# Patient Record
Sex: Female | Born: 1970 | Race: White | Hispanic: No | Marital: Married | Smoking: Never smoker
Health system: Southern US, Community
[De-identification: ages and names within clinical notes are randomized; demographics above are authoritative.]

## PROBLEM LIST (undated history)

## (undated) DIAGNOSIS — I890 Lymphedema, not elsewhere classified: Secondary | ICD-10-CM

## (undated) DIAGNOSIS — K635 Polyp of colon: Secondary | ICD-10-CM

## (undated) DIAGNOSIS — S4990XA Unspecified injury of shoulder and upper arm, unspecified arm, initial encounter: Secondary | ICD-10-CM

## (undated) DIAGNOSIS — T4145XA Adverse effect of unspecified anesthetic, initial encounter: Secondary | ICD-10-CM

## (undated) DIAGNOSIS — I2510 Atherosclerotic heart disease of native coronary artery without angina pectoris: Secondary | ICD-10-CM

## (undated) DIAGNOSIS — I739 Peripheral vascular disease, unspecified: Secondary | ICD-10-CM

## (undated) DIAGNOSIS — T8859XA Other complications of anesthesia, initial encounter: Secondary | ICD-10-CM

## (undated) DIAGNOSIS — E039 Hypothyroidism, unspecified: Secondary | ICD-10-CM

## (undated) DIAGNOSIS — R112 Nausea with vomiting, unspecified: Secondary | ICD-10-CM

## (undated) DIAGNOSIS — K7689 Other specified diseases of liver: Secondary | ICD-10-CM

## (undated) DIAGNOSIS — E079 Disorder of thyroid, unspecified: Secondary | ICD-10-CM

## (undated) DIAGNOSIS — N809 Endometriosis, unspecified: Secondary | ICD-10-CM

## (undated) DIAGNOSIS — Z9889 Other specified postprocedural states: Secondary | ICD-10-CM

## (undated) DIAGNOSIS — R7303 Prediabetes: Secondary | ICD-10-CM

## (undated) DIAGNOSIS — J342 Deviated nasal septum: Secondary | ICD-10-CM

## (undated) DIAGNOSIS — E162 Hypoglycemia, unspecified: Secondary | ICD-10-CM

## (undated) DIAGNOSIS — I8390 Asymptomatic varicose veins of unspecified lower extremity: Secondary | ICD-10-CM

## (undated) DIAGNOSIS — I10 Essential (primary) hypertension: Secondary | ICD-10-CM

## (undated) DIAGNOSIS — C439 Malignant melanoma of skin, unspecified: Secondary | ICD-10-CM

## (undated) DIAGNOSIS — Z8489 Family history of other specified conditions: Secondary | ICD-10-CM

## (undated) DIAGNOSIS — N200 Calculus of kidney: Secondary | ICD-10-CM

## (undated) DIAGNOSIS — G43009 Migraine without aura, not intractable, without status migrainosus: Secondary | ICD-10-CM

## (undated) DIAGNOSIS — R928 Other abnormal and inconclusive findings on diagnostic imaging of breast: Secondary | ICD-10-CM

## (undated) DIAGNOSIS — G4730 Sleep apnea, unspecified: Secondary | ICD-10-CM

## (undated) DIAGNOSIS — T7840XA Allergy, unspecified, initial encounter: Secondary | ICD-10-CM

## (undated) HISTORY — PX: COLONOSCOPY: SHX174

## (undated) HISTORY — DX: Calculus of kidney: N20.0

## (undated) HISTORY — DX: Hypoglycemia, unspecified: E16.2

## (undated) HISTORY — DX: Endometriosis, unspecified: N80.9

## (undated) HISTORY — PX: TUBAL LIGATION: SHX77

## (undated) HISTORY — DX: Migraine without aura, not intractable, without status migrainosus: G43.009

## (undated) HISTORY — PX: ABDOMINAL HYSTERECTOMY: SHX81

## (undated) HISTORY — PX: BREAST BIOPSY: SHX20

## (undated) HISTORY — DX: Other specified diseases of liver: K76.89

## (undated) HISTORY — DX: Allergy, unspecified, initial encounter: T78.40XA

## (undated) HISTORY — DX: Essential (primary) hypertension: I10

## (undated) HISTORY — PX: COLONOSCOPY: SHX5424

## (undated) HISTORY — DX: Malignant melanoma of skin, unspecified: C43.9

## (undated) HISTORY — DX: Polyp of colon: K63.5

## (undated) HISTORY — DX: Disorder of thyroid, unspecified: E07.9

## (undated) HISTORY — PX: WISDOM TOOTH EXTRACTION: SHX21

## (undated) HISTORY — PX: BREAST EXCISIONAL BIOPSY: SUR124

## (undated) HISTORY — DX: Asymptomatic varicose veins of unspecified lower extremity: I83.90

---

## 1994-08-12 DIAGNOSIS — E162 Hypoglycemia, unspecified: Secondary | ICD-10-CM

## 1994-08-12 HISTORY — DX: Hypoglycemia, unspecified: E16.2

## 1998-01-16 ENCOUNTER — Other Ambulatory Visit: Admission: RE | Admit: 1998-01-16 | Discharge: 1998-01-16 | Payer: Self-pay | Admitting: Obstetrics and Gynecology

## 1998-01-24 ENCOUNTER — Encounter: Admission: RE | Admit: 1998-01-24 | Discharge: 1998-04-24 | Payer: Self-pay | Admitting: Obstetrics and Gynecology

## 1998-05-15 ENCOUNTER — Ambulatory Visit (HOSPITAL_COMMUNITY): Admission: RE | Admit: 1998-05-15 | Discharge: 1998-05-15 | Payer: Self-pay | Admitting: Obstetrics and Gynecology

## 1998-07-20 ENCOUNTER — Inpatient Hospital Stay (HOSPITAL_COMMUNITY): Admission: AD | Admit: 1998-07-20 | Discharge: 1998-07-20 | Payer: Self-pay | Admitting: *Deleted

## 1998-08-10 ENCOUNTER — Inpatient Hospital Stay (HOSPITAL_COMMUNITY): Admission: AD | Admit: 1998-08-10 | Discharge: 1998-08-13 | Payer: Self-pay | Admitting: Obstetrics and Gynecology

## 1998-09-15 ENCOUNTER — Other Ambulatory Visit: Admission: RE | Admit: 1998-09-15 | Discharge: 1998-09-15 | Payer: Self-pay | Admitting: Obstetrics and Gynecology

## 1999-05-07 ENCOUNTER — Other Ambulatory Visit: Admission: RE | Admit: 1999-05-07 | Discharge: 1999-05-07 | Payer: Self-pay | Admitting: Obstetrics and Gynecology

## 1999-05-07 ENCOUNTER — Encounter (INDEPENDENT_AMBULATORY_CARE_PROVIDER_SITE_OTHER): Payer: Self-pay

## 2000-09-12 ENCOUNTER — Ambulatory Visit (HOSPITAL_COMMUNITY): Admission: RE | Admit: 2000-09-12 | Discharge: 2000-09-12 | Payer: Self-pay | Admitting: *Deleted

## 2001-03-27 ENCOUNTER — Other Ambulatory Visit: Admission: RE | Admit: 2001-03-27 | Discharge: 2001-03-27 | Payer: Self-pay | Admitting: Obstetrics and Gynecology

## 2002-04-05 ENCOUNTER — Other Ambulatory Visit: Admission: RE | Admit: 2002-04-05 | Discharge: 2002-04-05 | Payer: Self-pay | Admitting: Obstetrics and Gynecology

## 2003-05-11 ENCOUNTER — Other Ambulatory Visit: Admission: RE | Admit: 2003-05-11 | Discharge: 2003-05-11 | Payer: Self-pay | Admitting: Obstetrics and Gynecology

## 2004-05-17 ENCOUNTER — Other Ambulatory Visit: Admission: RE | Admit: 2004-05-17 | Discharge: 2004-05-17 | Payer: Self-pay | Admitting: Obstetrics and Gynecology

## 2005-06-28 ENCOUNTER — Other Ambulatory Visit: Admission: RE | Admit: 2005-06-28 | Discharge: 2005-06-28 | Payer: Self-pay | Admitting: Obstetrics and Gynecology

## 2008-01-14 ENCOUNTER — Encounter: Admission: RE | Admit: 2008-01-14 | Discharge: 2008-01-14 | Payer: Self-pay | Admitting: Obstetrics and Gynecology

## 2009-03-21 ENCOUNTER — Ambulatory Visit: Payer: Self-pay | Admitting: Internal Medicine

## 2009-03-21 ENCOUNTER — Encounter (INDEPENDENT_AMBULATORY_CARE_PROVIDER_SITE_OTHER): Payer: Self-pay | Admitting: *Deleted

## 2009-04-27 ENCOUNTER — Encounter: Payer: Self-pay | Admitting: Internal Medicine

## 2009-04-27 ENCOUNTER — Ambulatory Visit: Payer: Self-pay | Admitting: Internal Medicine

## 2009-04-28 ENCOUNTER — Encounter: Payer: Self-pay | Admitting: Internal Medicine

## 2009-08-12 DIAGNOSIS — K635 Polyp of colon: Secondary | ICD-10-CM

## 2009-08-12 HISTORY — DX: Polyp of colon: K63.5

## 2010-12-28 NOTE — Procedures (Signed)
[place]. [facility]  Patient:    Shannon Quinn, Shannon Quinn                       MRN: [id number] Proc. Date: 09/12/00 Adm. Date:  [id number] Disc. Date: [id number] Attending:  Sharol Given R                           Procedure Report  DATE OF BIRTH:  29-Nov-1970.  PROCEDURE:  Colonoscopy.  ENDOSCOPIST:  Anselmo Rod, M.D.  INSTRUMENT USED:  Olympus video colonoscope.  INDICATION FOR PROCEDURE:  Rectal bleeding and a change in bowel habits and a family history of colon cancer in a 40 year old white female.  Rule out colonic polyps, masses, hemorrhoids, etc.  PREPROCEDURE PREPARATION:  Informed consent was procured from the patient. The patient was fasted for eight hours prior to the procedure and prepped with a bottle of magnesium citrate and a gallon of NuLytely the night prior to the procedure.  PREPROCEDURE PHYSICAL:  VITAL SIGNS:  The patient had stable vital signs.  NECK:  Supple.  CHEST:  Clear to auscultation.  S1, S2 regular.  ABDOMEN:  Soft with normal abdominal bowel sounds.  DESCRIPTION OF PROCEDURE:  The patient was placed in the left lateral decubitus position and sedated with 60 mg of Demerol and 6 mg of Versed intravenously.  Once the patient was adequately sedate and maintained on low-flow oxygen and continuous cardiac monitoring, the Olympus video colonoscope was advanced from the rectum to the cecum without difficulty. The entire colonic mucosa appeared healthy.  There were no _____, polyps, erosions, erosions, ulcerations were seen.  The patient tolerated the procedure well without complications.  RECOMMENDATIONS: 1. The patient has been advised to increase the fluid and fiber in her diet. 2. Outpatient follow-up is advised in the next two weeks.  Further    recommendations may be made at that time. DD:  09/12/00 TD:  09/15/00 Job: [id number]

## 2012-05-25 ENCOUNTER — Ambulatory Visit (INDEPENDENT_AMBULATORY_CARE_PROVIDER_SITE_OTHER): Payer: Managed Care, Other (non HMO) | Admitting: Family

## 2012-05-25 ENCOUNTER — Encounter: Payer: Self-pay | Admitting: Family

## 2012-05-25 VITALS — BP 130/88 | HR 65 | Temp 98.1°F | Resp 14 | Ht 65.0 in | Wt 207.0 lb

## 2012-05-25 DIAGNOSIS — Z23 Encounter for immunization: Secondary | ICD-10-CM

## 2012-05-25 DIAGNOSIS — E039 Hypothyroidism, unspecified: Secondary | ICD-10-CM | POA: Insufficient documentation

## 2012-05-25 DIAGNOSIS — B350 Tinea barbae and tinea capitis: Secondary | ICD-10-CM

## 2012-05-25 DIAGNOSIS — Z0000 Encounter for general adult medical examination without abnormal findings: Secondary | ICD-10-CM

## 2012-05-25 LAB — CBC WITH DIFFERENTIAL/PLATELET
Basophils Absolute: 0.1 10*3/uL (ref 0.0–0.1)
Basophils Relative: 1 % (ref 0–1)
Eosinophils Absolute: 0.3 10*3/uL (ref 0.0–0.7)
Eosinophils Relative: 3 % (ref 0–5)
Lymphs Abs: 2.4 10*3/uL (ref 0.7–4.0)
MCH: 29.3 pg (ref 26.0–34.0)
MCHC: 33.3 g/dL (ref 30.0–36.0)
MCV: 87.8 fL (ref 78.0–100.0)
Neutrophils Relative %: 63 % (ref 43–77)
Platelets: 362 10*3/uL (ref 150–400)
RBC: 4.58 MIL/uL (ref 3.87–5.11)
RDW: 13.5 % (ref 11.5–15.5)

## 2012-05-25 MED ORDER — TERBINAFINE HCL 250 MG PO TABS: 250.0000 mg | ORAL_TABLET | Freq: Every day | ORAL | Status: DC

## 2012-05-25 NOTE — Assessment & Plan Note (Signed)
Pt counseled on 1500 kcal diet, exercise and weight loss.  Tdap today, declines flu shot. Defer mammo/pap to GYN.

## 2012-05-25 NOTE — Assessment & Plan Note (Signed)
Will rx with lamisil, selsun shampoo.

## 2012-05-25 NOTE — Patient Instructions (Addendum)
Please complete blood work prior to leaving.  We will contact you with your results.  Please schedule a follow up appointment in 6 months.

## 2012-05-25 NOTE — Assessment & Plan Note (Signed)
Obtain tsh, continue synthroid.  

## 2012-05-25 NOTE — Progress Notes (Signed)
Subjective:    Patient ID: Shannon Quinn, female    DOB: 1971/02/23, 41 y.o.   MRN: [id number]  HPI  Shannon Quinn is a 41 yr old female who presents today to establish care.   Hypothyroidism- She was diagnosed about 1 year ago.  Diagnosed by OB/GYN.   Report ringworm of her scalp last bothered her 1 year ago.  Has required oral therapy in the past.    Preventative- She reports + exercise.  She started doing a minimum of 8000 steps a day. Diet is ok, reports that she eats too late at night.  Sister thinks that she is "insulin resistant." Last mammogram <1 yr ago.  Has been ordered by GYN.  Pap smear is also up to date per GYN. Declines flu shot.     Review of Systems  Constitutional:       Pt has gained 15 pounds in the last 6 months.   HENT: Negative for hearing loss and congestion.   Eyes:       Requires reading glasses  Respiratory: Negative for cough.   Cardiovascular: Negative for leg swelling.  Gastrointestinal: Negative for nausea, vomiting and diarrhea.  Genitourinary: Negative for dysuria, frequency and menstrual problem.  Musculoskeletal: Negative for myalgias and arthralgias.  Skin: Positive for rash.  Neurological: Negative for headaches.  Psychiatric/Behavioral:       Denies depression/anxiety   Past Medical History  Diagnosis Date  . Thyroid disease   . Colon polyps 2011    3 polpys removed  . Hypoglycemic syndrome 1996    during pregnancy    History   Social History  . Marital Status: Married    Spouse Name: N/A    Number of Children: 2  . Years of Education: N/A   Occupational History  . Not on file.   Social History Main Topics  . Smoking status: Never Smoker   . Smokeless tobacco: Never Used  . Alcohol Use: No  . Drug Use: No  . Sexually Active: Yes -- Female partner(s)    Birth Control/ Protection: Other-see comments     pt had a tubal ligation in 2000   Other Topics Concern  . Not on file   Social History Narrative   Regular exercise:  walking 1 mile per dayCaffeine use: soda drinker    Past Surgical History  Procedure Date  . Tubal ligation     Family History  Problem Relation Age of Onset  . Arthritis Mother   . Alcohol abuse Father   . Arthritis Father   . Hyperlipidemia Father   . Stroke Father     2012  . Depression Father   . Diabetes Father   . Heart disease Father 47  . Diabetes Sister   . Cancer Paternal Uncle     breast cancer  . Dementia Maternal Grandmother   . Heart disease Maternal Grandfather 42  . Diabetes Paternal Grandmother   . COPD Paternal Grandfather     emphysema    Allergies  Allergen Reactions  . Neomycin   . Sulfonamide Derivatives     Current Outpatient Prescriptions on File Prior to Visit  Medication Sig Dispense Refill  . levothyroxine (SYNTHROID, LEVOTHROID) 25 MCG tablet Take 25 mcg by mouth daily.        BP 130/88  Pulse 65  Temp 98.1 F (36.7 C) (Oral)  Resp 14  Ht 5\' 5"  (1.651 m)  Wt 207 lb (93.895 kg)  BMI 34.45 kg/m2  SpO2 98%  LMP  05/18/2012       Objective:   Physical Exam  Physical Exam  Constitutional: She is oriented to person, place, and time. She appears well-developed and well-nourished. No distress.  HENT:  Head: Normocephalic and atraumatic.  Right Ear: Tympanic membrane and ear canal normal.  Left Ear: Tympanic membrane and ear canal normal.  Mouth/Throat: Oropharynx is clear and moist.  Eyes: Pupils are equal, round, and reactive to light. No scleral icterus.  Neck: Normal range of motion. No thyromegaly present.  Cardiovascular: Normal rate and regular rhythm.   No murmur heard. Pulmonary/Chest: Effort normal and breath sounds normal. No respiratory distress. He has no wheezes. She has no rales. She exhibits no tenderness.  Abdominal: Soft. Bowel sounds are normal. He exhibits no distension and no mass. There is no tenderness. There is no rebound and no guarding.  Musculoskeletal: She exhibits no edema.  Lymphadenopathy:    She  has no cervical adenopathy.  Neurological: She is alert and oriented to person, place, and time. She has normal reflexes. She exhibits normal muscle tone. Coordination normal.  Skin: Skin is warm and dry. annular erythema noted left posterior scalp. Psychiatric: She has a normal mood and affect. Her behavior is normal. Judgment and thought content normal.  Breast/pelvic: deferred to GYN.         Assessment & Plan:         Assessment & Plan:

## 2012-05-26 ENCOUNTER — Telehealth: Payer: Self-pay | Admitting: Family

## 2012-05-26 LAB — URINALYSIS, ROUTINE W REFLEX MICROSCOPIC
Glucose, UA: NEGATIVE mg/dL
Hgb urine dipstick: NEGATIVE
Ketones, ur: NEGATIVE mg/dL
Leukocytes, UA: NEGATIVE
Nitrite: NEGATIVE
Specific Gravity, Urine: 1.023 (ref 1.005–1.030)
pH: 7 (ref 5.0–8.0)

## 2012-05-26 LAB — BASIC METABOLIC PANEL WITH GFR
Calcium: 9.2 mg/dL (ref 8.4–10.5)
Creat: 0.79 mg/dL (ref 0.50–1.10)
GFR, Est African American: >89 mL/min
Glucose, Bld: 66 mg/dL — ABNORMAL LOW (ref 70–99)
Sodium: 139 mEq/L (ref 135–145)

## 2012-05-26 LAB — HEPATIC FUNCTION PANEL
Albumin: 4.4 g/dL (ref 3.5–5.2)
Total Bilirubin: 0.5 mg/dL (ref 0.3–1.2)
Total Protein: 6.9 g/dL (ref 6.0–8.3)

## 2012-05-26 LAB — LIPID PANEL
Cholesterol: 191 mg/dL (ref 0–200)
HDL: 49 mg/dL (ref >39)
Total CHOL/HDL Ratio: 3.9 Ratio

## 2012-05-26 MED ORDER — LEVOTHYROXINE SODIUM 25 MCG PO TABS: 25.0000 ug | ORAL_TABLET | Freq: Every day | ORAL | Status: DC

## 2012-05-26 NOTE — Telephone Encounter (Signed)
Refill sent.

## 2012-05-29 ENCOUNTER — Telehealth: Payer: Self-pay | Admitting: *Deleted

## 2012-05-29 NOTE — Telephone Encounter (Signed)
Physician Results form (employe wellness program) completed and faxed to [facility] @ [phone] on 05/26/12 @ 3:30pm.

## 2012-06-24 ENCOUNTER — Telehealth: Payer: Self-pay | Admitting: Family

## 2012-06-24 MED ORDER — TERBINAFINE HCL 250 MG PO TABS: 250.0000 mg | ORAL_TABLET | Freq: Every day | ORAL | Status: DC

## 2012-06-24 NOTE — Telephone Encounter (Signed)
Pt came in with son for his apt, requests refill on lamisil- reports ring worm is not resolved. Refill sent to pharmacy. Asked her to let me know if not resolved after completion of this round and we will plan referral to dermatology.

## 2012-06-24 NOTE — Telephone Encounter (Signed)
Pt called back stating pharmacy did not receive Lamisil Rx. Rx called to pharmacist at [facility] [address]. Pt notified.

## 2012-11-02 ENCOUNTER — Telehealth: Payer: Self-pay | Admitting: Family

## 2012-11-02 MED ORDER — LEVOTHYROXINE SODIUM 25 MCG PO TABS: 25.0000 ug | ORAL_TABLET | Freq: Every day | ORAL | Status: DC

## 2012-11-02 NOTE — Telephone Encounter (Signed)
Levothyroxine 25 mcg tablet take 1 tablet by mouth daily

## 2012-11-02 NOTE — Telephone Encounter (Signed)
Refill re-sent  

## 2012-11-02 NOTE — Telephone Encounter (Signed)
GOT AN ADDITIONAL FAX FROM THE PHARMACY ASKING FOR A 90 DAY SUPPLY  HER INSURANCE REQUIRES HER TO GET 90 DAYS AT A TIME

## 2012-11-02 NOTE — Telephone Encounter (Signed)
Refill sent to pharmacy, #30 x no refills. Pt is due for follow up in April.  Please call pt to arrange appt.

## 2012-11-04 NOTE — Telephone Encounter (Signed)
Patient returned phone call, informed her of med refill and she scheduled an appointment for late april

## 2012-11-04 NOTE — Telephone Encounter (Signed)
Left message for patient to return my call.

## 2012-12-01 ENCOUNTER — Encounter: Payer: Self-pay | Admitting: Family

## 2012-12-01 ENCOUNTER — Ambulatory Visit (INDEPENDENT_AMBULATORY_CARE_PROVIDER_SITE_OTHER): Payer: Managed Care, Other (non HMO) | Admitting: Family

## 2012-12-01 ENCOUNTER — Ambulatory Visit (HOSPITAL_BASED_OUTPATIENT_CLINIC_OR_DEPARTMENT_OTHER)
Admission: RE | Admit: 2012-12-01 | Discharge: 2012-12-01 | Disposition: A | Discharge status: Home / Self Care | Payer: Managed Care, Other (non HMO) | Source: Ambulatory Visit | Attending: Family | Admitting: Family

## 2012-12-01 VITALS — BP 120/84 | HR 64 | Temp 99.0°F | Resp 16 | Ht 65.0 in | Wt 210.0 lb

## 2012-12-01 DIAGNOSIS — M79609 Pain in unspecified limb: Secondary | ICD-10-CM | POA: Insufficient documentation

## 2012-12-01 DIAGNOSIS — E039 Hypothyroidism, unspecified: Secondary | ICD-10-CM

## 2012-12-01 DIAGNOSIS — M25562 Pain in left knee: Secondary | ICD-10-CM

## 2012-12-01 DIAGNOSIS — I8390 Asymptomatic varicose veins of unspecified lower extremity: Secondary | ICD-10-CM

## 2012-12-01 DIAGNOSIS — M7989 Other specified soft tissue disorders: Secondary | ICD-10-CM | POA: Insufficient documentation

## 2012-12-01 DIAGNOSIS — M25569 Pain in unspecified knee: Secondary | ICD-10-CM

## 2012-12-01 NOTE — Progress Notes (Signed)
Subjective:    Patient ID: Shannon Quinn, female    DOB: September 11, 1970, 42 y.o.   MRN: [id number]  HPI  [name] is a 42 yr old female who presents today for follow up.  1) Hypothyroid- Pt is concerned re: weight gain.  Reports that she eats smaller portions.  For example, she has switched from whopper to whopper junior.  For snack eats a "small bag of chip." Not exercising.  2) Varicose Veins-Reports that vein is on left upper thigh.  First noticed 1 year ago. Reports that she has throbbing pain if her "leg is tight.  She reports some shooting pain into the right foot.    Review of Systems    see HPI  Past Medical History  Diagnosis Date  . Thyroid disease   . Colon polyps 2011    3 polpys removed  . Hypoglycemic syndrome 1996    during pregnancy    History   Social History  . Marital Status: Married    Spouse Name: N/A    Number of Children: 2  . Years of Education: N/A   Occupational History  . Not on file.   Social History Main Topics  . Smoking status: Never Smoker   . Smokeless tobacco: Never Used  . Alcohol Use: No  . Drug Use: No  . Sexually Active: Yes -- Female partner(s)    Birth Control/ Protection: Other-see comments     Comment: pt had a tubal ligation in 2000   Other Topics Concern  . Not on file   Social History Narrative   Regular exercise: walking 1 mile per day   Caffeine use: soda drinker    Past Surgical History  Procedure Laterality Date  . Tubal ligation      Family History  Problem Relation Age of Onset  . Arthritis Mother   . Alcohol abuse Father   . Arthritis Father   . Hyperlipidemia Father   . Stroke Father     2012  . Depression Father   . Diabetes Father   . Heart disease Father 50  . Diabetes Sister   . Cancer Paternal Uncle     breast cancer  . Dementia Maternal Grandmother   . Heart disease Maternal Grandfather 42  . Diabetes Paternal Grandmother   . COPD Paternal Grandfather     emphysema    Allergies   Allergen Reactions  . Neomycin   . Sulfonamide Derivatives     Current Outpatient Prescriptions on File Prior to Visit  Medication Sig Dispense Refill  . levothyroxine (SYNTHROID, LEVOTHROID) 25 MCG tablet Take 1 tablet (25 mcg total) by mouth daily.  90 tablet  0   No current facility-administered medications on file prior to visit.    BP 120/84  Pulse 64  Temp(Src) 99 F (37.2 C) (Oral)  Resp 16  Ht 5\' 5"  (1.651 m)  Wt 210 lb 0.6 oz (95.274 kg)  BMI 34.95 kg/m2  SpO2 99%    Objective:   Physical Exam  Constitutional: She is oriented to person, place, and time. She appears well-developed and well-nourished. No distress.  Cardiovascular: Normal rate and regular rhythm.   No murmur heard. Pulmonary/Chest: Effort normal and breath sounds normal. No respiratory distress. She has no wheezes. She has no rales. She exhibits no tenderness.  Musculoskeletal:  + varicose vein noted left  Above knee  Neurological: She is alert and oriented to person, place, and time.          Assessment &  Plan:

## 2012-12-01 NOTE — Patient Instructions (Addendum)
Please complete lab work prior to leaving. Complete your ultrasound on the first floor.  Try to keep calories between 1200-1500.  You can try using my fitness pal to keep track. Follow up in 6 months. Start compression hose, call if you would like a referral to the vascular surgeon.

## 2012-12-02 ENCOUNTER — Encounter: Payer: Self-pay | Admitting: Family

## 2012-12-05 NOTE — Assessment & Plan Note (Signed)
LLE doppler negative for DVT.  Recommended thigh high support hose.

## 2012-12-05 NOTE — Assessment & Plan Note (Signed)
TSH normal. Continue current dose of synthroid. Recommended regular exercise and healthier dietary choices. Recommended that she try counting her calories using an app such as My Fitness Pal.

## 2012-12-07 ENCOUNTER — Telehealth: Payer: Self-pay | Admitting: *Deleted

## 2012-12-07 NOTE — Telephone Encounter (Signed)
Attempted to reach pt and left message on cell# to return my call. Result letter mailed as we have not received response to previous messages.  Notes Recorded by Kathi Simpers, CMA on 12/03/2012 at 12:13 PM Left message on cell# to return my call. ------  Notes Recorded by Kathi Simpers, CMA on 12/02/2012 at 4:30 PM Left message on home/cell# to return my call. ------  Notes Recorded by Sandford Craze, NP on 12/02/2012 at 11:18 AM Negative for DVT.

## 2013-03-26 ENCOUNTER — Encounter: Payer: Self-pay | Admitting: Physician Assistant

## 2013-03-26 ENCOUNTER — Ambulatory Visit (INDEPENDENT_AMBULATORY_CARE_PROVIDER_SITE_OTHER): Payer: Managed Care, Other (non HMO) | Admitting: Physician Assistant

## 2013-03-26 VITALS — BP 116/74 | HR 61 | Temp 98.3°F | Resp 22 | Wt 207.2 lb

## 2013-03-26 DIAGNOSIS — F411 Generalized anxiety disorder: Secondary | ICD-10-CM

## 2013-03-26 DIAGNOSIS — J3489 Other specified disorders of nose and nasal sinuses: Secondary | ICD-10-CM

## 2013-03-26 DIAGNOSIS — F32A Depression, unspecified: Secondary | ICD-10-CM | POA: Insufficient documentation

## 2013-03-26 DIAGNOSIS — F419 Anxiety disorder, unspecified: Secondary | ICD-10-CM

## 2013-03-26 DIAGNOSIS — F329 Major depressive disorder, single episode, unspecified: Secondary | ICD-10-CM | POA: Insufficient documentation

## 2013-03-26 DIAGNOSIS — R0981 Nasal congestion: Secondary | ICD-10-CM

## 2013-03-26 DIAGNOSIS — J309 Allergic rhinitis, unspecified: Secondary | ICD-10-CM

## 2013-03-26 MED ORDER — FLUTICASONE PROPIONATE 50 MCG/ACT NA SUSP: 2.0000 | Freq: Every day | NASAL | Status: DC

## 2013-03-26 MED ORDER — CLONAZEPAM 0.5 MG PO TABS: 0.2500 mg | ORAL_TABLET | Freq: Two times a day (BID) | ORAL | Status: DC | PRN

## 2013-03-26 NOTE — Assessment & Plan Note (Signed)
Rx for Flonase.  Encourage daily claritin.  Rhinorrhea and congestion most likely due to excessive crying 2/2 life events mixed with some seasonal allergies.

## 2013-03-26 NOTE — Assessment & Plan Note (Signed)
Rx for klonopin.  Patient instructed on proper dosing.  Patient encouraged to find a little time to do something she enjoys.

## 2013-03-26 NOTE — Progress Notes (Signed)
Patient ID: Shannon Quinn, female   DOB: 06-May-1971, 42 y.o.   MRN: [id number]   Patient is a 42 year-old female who presents to clinic today c/o nasal congestion intermittently over the past month.  Patient denies fever, chills.  Endorses headache, but denies sinus pressure or pain.  Denies N/V/D/C.  Denies history of allergies or asthma.  Upon further questioning, patient becomes extremely tearful, expressing immense stress recently.  Her father has a terminal illness and is under palliative care.  Her mother is older and the patient is having to be the main care provider.  She is also a wife and mother of three children.  She is extremely stressed and is worried about her job.  Patient denies history of anxiety or depression.  Feels her anxiety/stress is situational.  Denies suicidal or homicidal ideation.  Is trying to take some time for herself to de-stress but is unsuccessful.    Past Medical History  Diagnosis Date  . Thyroid disease   . Colon polyps 2011    3 polpys removed  . Hypoglycemic syndrome 1996    during pregnancy   Current Outpatient Prescriptions on File Prior to Visit  Medication Sig Dispense Refill  . levothyroxine (SYNTHROID, LEVOTHROID) 25 MCG tablet Take 1 tablet (25 mcg total) by mouth daily.  90 tablet  0   No current facility-administered medications on file prior to visit.   Allergies  Allergen Reactions  . Neomycin   . Sulfonamide Derivatives    Family History  Problem Relation Age of Onset  . Arthritis Mother   . Alcohol abuse Father   . Arthritis Father   . Hyperlipidemia Father   . Stroke Father     2012  . Depression Father   . Diabetes Father   . Heart disease Father 78  . Diabetes Sister   . Cancer Paternal Uncle     breast cancer  . Dementia Maternal Grandmother   . Heart disease Maternal Grandfather 42  . Diabetes Paternal Grandmother   . COPD Paternal Grandfather     emphysema   History   Social History  . Marital Status: Married   Spouse Name: N/A    Number of Children: 2  . Years of Education: N/A   Social History Main Topics  . Smoking status: Never Smoker   . Smokeless tobacco: Never Used  . Alcohol Use: No  . Drug Use: No  . Sexual Activity: Yes    Partners: Male    Birth Control/ Protection: Other-see comments     Comment: pt had a tubal ligation in 2000   Other Topics Concern  . None   Social History Narrative   Regular exercise: walking 1 mile per day   Caffeine use: soda drinker   Review of Systems  Constitutional: Negative for fever, chills and malaise/fatigue.  Eyes: Negative for blurred vision and double vision.  Respiratory: Negative for cough.   Cardiovascular: Negative for chest pain and palpitations.  Gastrointestinal: Negative for heartburn, nausea, vomiting, abdominal pain, diarrhea and constipation.  Musculoskeletal: Negative for myalgias.  Neurological: Positive for headaches.  Endo/Heme/Allergies: Negative for environmental allergies.  Psychiatric/Behavioral: Negative for depression, suicidal ideas and substance abuse. The patient is nervous/anxious and has insomnia.     Filed Vitals:   03/26/13 0926  BP: 116/74  Pulse: 61  Temp: 98.3 F (36.8 C)  Resp: 22    Physical Exam  Vitals reviewed. Constitutional: She is oriented to person, place, and time.  Well developed, well nourished,  anxious and tearful throughout interview.  HENT:  Head: Normocephalic and atraumatic.  Right Ear: External ear normal.  Left Ear: External ear normal.  Mouth/Throat: Oropharynx is clear and moist.  TM WNL bilaterally Boggy turbinates with clear rhinorrhea bilaterally  Eyes: Conjunctivae are normal. Pupils are equal, round, and reactive to light.  Neck: Neck supple.  Cardiovascular: Normal rate, regular rhythm, normal heart sounds and intact distal pulses.   Pulmonary/Chest: Effort normal and breath sounds normal. No respiratory distress. She has no wheezes. She has no rales. She exhibits no  tenderness.  Lymphadenopathy:    She has no cervical adenopathy.  Neurological: She is alert and oriented to person, place, and time.  Skin: Skin is warm and dry.  Psychiatric:  Anxious, tearful     Assessment/Plan: Anxiety Rx for klonopin.  Patient instructed on proper dosing.  Patient encouraged to find a little time to do something she enjoys.    Nasal congestion with rhinorrhea Rx for Flonase.  Encourage daily claritin.  Rhinorrhea and congestion most likely due to excessive crying 2/2 life events mixed with some seasonal allergies.

## 2013-03-26 NOTE — Patient Instructions (Signed)
Please take 1/2 klonopin tablet up to two times per day for anxiety.  May increase to 1 tablet twice a day after 1 week.  Please take a daily claritin, saline nasal spray and Flonase spray.  Try adding a daily probiotic to your diet.  Please call if you need anything.   Stress Stress-related medical problems are becoming increasingly common. The body has a built-in physical response to stressful situations. Faced with pressure, challenge or danger, we need to react quickly. Our bodies release hormones such as cortisol and adrenaline to help do this. These hormones are part of the "fight or flight" response and affect the metabolic rate, heart rate and blood pressure, resulting in a heightened, stressed state that prepares the body for optimum performance in dealing with a stressful situation. It is likely that early man required these mechanisms to stay alive, but usually modern stresses do not call for this, and the same hormones released in today's world can damage health and reduce coping ability. CAUSES  Pressure to perform at work, at school or in sports.  Threats of physical violence.  Money worries.  Arguments.  Family conflicts.  Divorce or separation from significant other.  Bereavement.  New job or unemployment.  Changes in location.  Alcohol or drug abuse. SOMETIMES, THERE IS NO PARTICULAR REASON FOR DEVELOPING STRESS. Almost all people are at risk of being stressed at some time in their lives. It is important to know that some stress is temporary and some is long term.  Temporary stress will go away when a situation is resolved. Most people can cope with short periods of stress, and it can often be relieved by relaxing, taking a walk, chatting through issues with friends, or having a good night's sleep.  Chronic (long-term, continuous) stress is much harder to deal with. It can be psychologically and emotionally damaging. It can be harmful both for an individual and for  friends and family. SYMPTOMS Everyone reacts to stress differently. There are some common effects that help [name] recognize it. In times of extreme stress, people may:  Shake uncontrollably.  Breathe faster and deeper than normal (hyperventilate).  Vomit.  For people with asthma, stress can trigger an attack.  For some people, stress may trigger migraine headaches, ulcers, and body pain. PHYSICAL EFFECTS OF STRESS MAY INCLUDE:  Loss of energy.  Skin problems.  Aches and pains resulting from tense muscles, including neck ache, backache and tension headaches.  Increased pain from arthritis and other conditions.  Irregular heart beat (palpitations).  Periods of irritability or anger.  Apathy or depression.  Anxiety (feeling uptight or worrying).  Unusual behavior.  Loss of appetite.  Comfort eating.  Lack of concentration.  Loss of, or decreased, sex-drive.  Increased smoking, drinking, or recreational drug use.  For women, missed periods.  Ulcers, joint pain, and muscle pain. Post-traumatic stress is the stress caused by any serious accident, strong emotional damage, or extremely difficult or violent experience such as rape or war. Post-traumatic stress victims can experience mixtures of emotions such as fear, shame, depression, guilt or anger. It may include recurrent memories or images that may be haunting. These feelings can last for weeks, months or even years after the traumatic event that triggered them. Specialized treatment, possibly with medicines and psychological therapies, is available. If stress is causing physical symptoms, severe distress or making it difficult for you to function as normal, it is worth seeing your caregiver. It is important to remember that although stress is  a usual part of life, extreme or prolonged stress can lead to other illnesses that will need treatment. It is better to visit a doctor sooner rather than later. Stress has been linked  to the development of high blood pressure and heart disease, as well as insomnia and depression. There is no diagnostic test for stress since everyone reacts to it differently. But a caregiver will be able to spot the physical symptoms, such as:  Headaches.  Shingles.  Ulcers. Emotional distress such as intense worry, low mood or irritability should be detected when the doctor asks pertinent questions to identify any underlying problems that might be the cause. In case there are physical reasons for the symptoms, the doctor may also want to do some tests to exclude certain conditions. If you feel that you are suffering from stress, try to identify the aspects of your life that are causing it. Sometimes you may not be able to change or avoid them, but even a small change can have a positive ripple effect. A simple lifestyle change can make all the difference. STRATEGIES THAT CAN HELP DEAL WITH STRESS:  Delegating or sharing responsibilities.  Avoiding confrontations.  Learning to be more assertive.  Regular exercise.  Avoid using alcohol or street drugs to cope.  Eating a healthy, balanced diet, rich in fruit and vegetables and proteins.  Finding humor or absurdity in stressful situations.  Never taking on more than you know you can handle comfortably.  Organizing your time better to get as much done as possible.  Talking to friends or family and sharing your thoughts and fears.  Listening to music or relaxation tapes.  Tensing and then relaxing your muscles, starting at the toes and working up to the head and neck. If you think that you would benefit from help, either in identifying the things that are causing your stress or in learning techniques to help you relax, see a caregiver who is capable of helping you with this. Rather than relying on medications, it is usually better to try and identify the things in your life that are causing stress and try to deal with them. There are  many techniques of managing stress including counseling, psychotherapy, aromatherapy, yoga, and exercise. Your caregiver can help you determine what is best for you. Document Released: 10/19/2002 Document Revised: 10/21/2011 Document Reviewed: 09/15/2007 [facility] Patient Information 2014 [place], [place].

## 2013-04-26 ENCOUNTER — Encounter: Payer: Self-pay | Admitting: Physician Assistant

## 2013-04-26 ENCOUNTER — Ambulatory Visit (INDEPENDENT_AMBULATORY_CARE_PROVIDER_SITE_OTHER): Payer: Managed Care, Other (non HMO) | Admitting: Physician Assistant

## 2013-04-26 VITALS — BP 114/86 | HR 68 | Temp 98.2°F | Resp 16 | Ht 65.0 in | Wt 211.0 lb

## 2013-04-26 DIAGNOSIS — F419 Anxiety disorder, unspecified: Secondary | ICD-10-CM

## 2013-04-26 DIAGNOSIS — F411 Generalized anxiety disorder: Secondary | ICD-10-CM

## 2013-04-26 DIAGNOSIS — Z23 Encounter for immunization: Secondary | ICD-10-CM

## 2013-04-26 MED ORDER — LEVOTHYROXINE SODIUM 25 MCG PO TABS: 25.0000 ug | ORAL_TABLET | Freq: Every day | ORAL | Status: DC

## 2013-04-26 MED ORDER — CLONAZEPAM 0.5 MG PO TABS: ORAL_TABLET | ORAL | Status: DC

## 2013-04-26 NOTE — Patient Instructions (Signed)
Please take medication as needed for anxiety.  Please try to utilize the counselors available to you through work.  I would like to see you in 1 month.

## 2013-04-26 NOTE — Assessment & Plan Note (Signed)
Increase Klonopin to 1 tablet BID x 7 days.  Then can increase to 2 tablets (1 mg total) BID as needed for anxiety.  Patient is to see counselor provided through work.  Follow-up in 1 month.  Patient declines addition of SSRI for long-term control of anxiety symptoms.

## 2013-04-26 NOTE — Progress Notes (Signed)
Patient ID: Shannon Quinn, female   DOB: March 16, 1971, 42 y.o.   MRN: [id number]  Patient presents to clinic today for 1 month follow-up for anxiety.  At time of last visit patient with severe anxiety to the point of being extremely tearful during interview.  Was dealing with being the main caregiver for her dying father.  Since last visit patient has lost her father.  Feels that she is doing a decent job of handling her grief.  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  Has been taking 1/2 0.5 mg klonopin twice a day as needed for anxiety.  Has notices some relief of symptoms, but does not feel it is controlling them.  Patient is amenable to seeing a therapist.  Past Medical History  Diagnosis Date  . Thyroid disease   . Colon polyps 2011    3 polpys removed  . Hypoglycemic syndrome 1996    during pregnancy    Current Outpatient Prescriptions on File Prior to Visit  Medication Sig Dispense Refill  . fluticasone (FLONASE) 50 MCG/ACT nasal spray Place 2 sprays into the nose daily.  16 g  6   No current facility-administered medications on file prior to visit.    Allergies  Allergen Reactions  . Neomycin   . Sulfonamide Derivatives     Family History  Problem Relation Age of Onset  . Arthritis Mother   . Alcohol abuse Father   . Arthritis Father   . Hyperlipidemia Father   . Stroke Father     2012  . Depression Father   . Diabetes Father   . Heart disease Father 21  . Diabetes Sister   . Cancer Paternal Uncle     breast cancer  . Dementia Maternal Grandmother   . Heart disease Maternal Grandfather 42  . Diabetes Paternal Grandmother   . COPD Paternal Grandfather     emphysema    History   Social History  . Marital Status: Married    Spouse Name: N/A    Number of Children: 2  . Years of Education: N/A   Social History Main Topics  . Smoking status: Never Smoker   . Smokeless tobacco:  Never Used  . Alcohol Use: No  . Drug Use: No  . Sexual Activity: Yes    Partners: Male    Birth Control/ Protection: Other-see comments     Comment: pt had a tubal ligation in 2000   Other Topics Concern  . None   Social History Narrative   Regular exercise: walking 1 mile per day   Caffeine use: soda drinker   Review of Systems  Neurological: Negative for headaches.  Psychiatric/Behavioral: Negative for depression, suicidal ideas and substance abuse. The patient is nervous/anxious. The patient does not have insomnia.   All other systems reviewed and are negative.   Filed Vitals:   04/26/13 1555  BP: 114/86  Pulse: 68  Temp: 98.2 F (36.8 C)  Resp: 16   Physical Exam  Vitals reviewed. Constitutional: She is oriented to person, place, and time and well-developed, well-nourished, and in no distress.  HENT:  Head: Normocephalic and atraumatic.  Cardiovascular: Normal rate, regular rhythm and normal heart sounds.   Neurological: She is alert and oriented to person, place, and time.  Skin: Skin is warm and dry.  Psychiatric:  Patient is tearful during interview, attributable to very recent loss of her father.   Assessment/Plan: No problem-specific assessment &  plan notes found for this encounter.

## 2013-05-24 ENCOUNTER — Encounter: Payer: Self-pay | Admitting: Family

## 2013-05-24 ENCOUNTER — Ambulatory Visit (INDEPENDENT_AMBULATORY_CARE_PROVIDER_SITE_OTHER): Payer: Managed Care, Other (non HMO) | Admitting: Family

## 2013-05-24 ENCOUNTER — Ambulatory Visit: Payer: Managed Care, Other (non HMO) | Admitting: Family

## 2013-05-24 VITALS — BP 128/78 | HR 63 | Temp 97.9°F | Resp 16 | Ht 65.0 in | Wt 211.1 lb

## 2013-05-24 DIAGNOSIS — F411 Generalized anxiety disorder: Secondary | ICD-10-CM

## 2013-05-24 DIAGNOSIS — F419 Anxiety disorder, unspecified: Secondary | ICD-10-CM

## 2013-05-24 DIAGNOSIS — E039 Hypothyroidism, unspecified: Secondary | ICD-10-CM

## 2013-05-24 NOTE — Progress Notes (Signed)
Subjective:    Patient ID: Shannon Quinn, female    DOB: 01-01-1971, 42 y.o.   MRN: [id number]  HPI  [name] is a 42 yr old female who presents today for follow up of her anxiety.  Since her last visit she has lost her mother in law. This is just shortly after her chronically ill father passed away.  She saw Shannon Cogan PA-c last vist and her clonazepam was increased.  She was advised to see her therapist at work but has not had time to do so.  She is using clonazepam 0.25mg  in AM and 0.25mg  mid day. Reports that this dosing seems to be helping.    Review of Systems    see hpi  Past Medical History  Diagnosis Date  . Thyroid disease   . Colon polyps 2011    3 polpys removed  . Hypoglycemic syndrome 1996    during pregnancy    History   Social History  . Marital Status: Married    Spouse Name: N/A    Number of Children: 2  . Years of Education: N/A   Occupational History  . Not on file.   Social History Main Topics  . Smoking status: Never Smoker   . Smokeless tobacco: Never Used  . Alcohol Use: No  . Drug Use: No  . Sexual Activity: Yes    Partners: Male    Birth Control/ Protection: Other-see comments     Comment: pt had a tubal ligation in 2000   Other Topics Concern  . Not on file   Social History Narrative   Regular exercise: walking 1 mile per day   Caffeine use: soda drinker    Past Surgical History  Procedure Laterality Date  . Tubal ligation      Family History  Problem Relation Age of Onset  . Arthritis Mother   . Alcohol abuse Father   . Arthritis Father   . Hyperlipidemia Father   . Stroke Father     2012  . Depression Father   . Diabetes Father   . Heart disease Father 40  . Diabetes Sister   . Cancer Paternal Uncle     breast cancer  . Dementia Maternal Grandmother   . Heart disease Maternal Grandfather 42  . Diabetes Paternal Grandmother   . COPD Paternal Grandfather     emphysema    Allergies  Allergen Reactions  .  Neomycin   . Sulfonamide Derivatives     Current Outpatient Prescriptions on File Prior to Visit  Medication Sig Dispense Refill  . clonazePAM (KLONOPIN) 0.5 MG tablet Take 1 tablet twice a day as needed x 1 week.  Then you may increase to 2 tablets twice a day  30 tablet  1  . fluticasone (FLONASE) 50 MCG/ACT nasal spray Place 2 sprays into the nose daily.  16 g  6  . levothyroxine (SYNTHROID, LEVOTHROID) 25 MCG tablet Take 1 tablet (25 mcg total) by mouth daily.  90 tablet  0   No current facility-administered medications on file prior to visit.    BP 128/78  Pulse 63  Temp(Src) 97.9 F (36.6 C) (Oral)  Resp 16  Ht 5\' 5"  (1.651 m)  Wt 211 lb 1.9 oz (95.763 kg)  BMI 35.13 kg/m2  SpO2 99%  LMP 04/12/2013    Objective:   Physical Exam  Constitutional: She appears well-developed and well-nourished. No distress.  Psychiatric: Her behavior is normal. Judgment and thought content normal.  Calm, pleasant. Became  briefly tearful upon discussion of her father's death          Assessment & Plan:

## 2013-05-24 NOTE — Patient Instructions (Signed)
Please follow up in 3 months.  

## 2013-05-25 LAB — TSH: TSH: 2.88 u[IU]/mL (ref 0.350–4.500)

## 2013-05-26 ENCOUNTER — Encounter: Payer: Self-pay | Admitting: Family

## 2013-05-28 NOTE — Assessment & Plan Note (Signed)
Stable at this time. She appears sad today, but I think that her reaction is appropriate give her recent losses.  We did discuss possibility of referral to a therapist at some point. She is not interested at this time. Continue prn clonazepam. 20 minutes spent with pt today.  >50% of this time was spent counseling pt on anxiety and grief.

## 2013-08-30 ENCOUNTER — Ambulatory Visit (INDEPENDENT_AMBULATORY_CARE_PROVIDER_SITE_OTHER): Payer: Managed Care, Other (non HMO) | Admitting: Family

## 2013-08-30 ENCOUNTER — Encounter: Payer: Self-pay | Admitting: Family

## 2013-08-30 VITALS — BP 116/82 | HR 64 | Temp 98.3°F | Resp 16 | Ht 65.0 in | Wt 200.1 lb

## 2013-08-30 DIAGNOSIS — F411 Generalized anxiety disorder: Secondary | ICD-10-CM

## 2013-08-30 DIAGNOSIS — F419 Anxiety disorder, unspecified: Secondary | ICD-10-CM

## 2013-08-30 DIAGNOSIS — E039 Hypothyroidism, unspecified: Secondary | ICD-10-CM

## 2013-08-30 MED ORDER — CLONAZEPAM 0.5 MG PO TABS: ORAL_TABLET | ORAL | Status: DC

## 2013-08-30 NOTE — Patient Instructions (Signed)
Please schedule a follow up appointment in 6 months.   

## 2013-08-30 NOTE — Progress Notes (Signed)
Pre visit review using our clinic review tool, if applicable. No additional management support is needed unless otherwise documented below in the visit note. 

## 2013-08-30 NOTE — Assessment & Plan Note (Signed)
Stable on prn clonazepam, continue same.

## 2013-08-30 NOTE — Progress Notes (Signed)
Subjective:    Patient ID: Shannon Quinn, female    DOB: [birth year], 43 y.o.   MRN: [id number]  HPI  Shannon Quinn is a 43 yr old female who presents today for follow up. She was last seen in October and was continued on clonazepam.  Overall notes improvement in anxiety. Stress has mellowed. Reports that she uses clonazepa about 2 days a week.   Hypothyroid- she is maintained on levothyroxine. Lab Results  Component Value Date   TSH 2.880 05/24/2013   She has lost 11 pounds since her last visit. She reports some dietary changes, eating more fruit.  Reports that she has been eating less.   Wt Readings from Last 3 Encounters:  08/30/13 200 lb 1.3 oz (90.756 kg)  05/24/13 211 lb 1.9 oz (95.763 kg)  04/26/13 211 lb (95.709 kg)    Review of Systems See HPI    Past Medical History  Diagnosis Date  . Thyroid disease   . Colon polyps 2011    3 polpys removed  . Hypoglycemic syndrome 1996    during pregnancy    History   Social History  . Marital Status: Married    Spouse Name: N/A    Number of Children: 2  . Years of Education: N/A   Occupational History  . Not on file.   Social History Main Topics  . Smoking status: Never Smoker   . Smokeless tobacco: Never Used  . Alcohol Use: No  . Drug Use: No  . Sexual Activity: Yes    Partners: Male    Birth Control/ Protection: Other-see comments     Comment: pt had a tubal ligation in 2000   Other Topics Concern  . Not on file   Social History Narrative   Regular exercise: walking 1 mile per day   Caffeine use: soda drinker    Past Surgical History  Procedure Laterality Date  . Tubal ligation      Family History  Problem Relation Age of Onset  . Arthritis Mother   . Alcohol abuse Father   . Arthritis Father   . Hyperlipidemia Father   . Stroke Father     2012  . Depression Father   . Diabetes Father   . Heart disease Father 68  . Diabetes Sister   . Cancer Paternal Uncle     breast cancer  . Dementia  Maternal Grandmother   . Heart disease Maternal Grandfather 42  . Diabetes Paternal Grandmother   . COPD Paternal Grandfather     emphysema    Allergies  Allergen Reactions  . Neomycin   . Sulfonamide Derivatives     Current Outpatient Prescriptions on File Prior to Visit  Medication Sig Dispense Refill  . clonazePAM (KLONOPIN) 0.5 MG tablet Take 1 tablet twice a day as needed x 1 week.  Then you may increase to 2 tablets twice a day  30 tablet  1  . fluticasone (FLONASE) 50 MCG/ACT nasal spray Place 2 sprays into the nose daily.  16 g  6  . levothyroxine (SYNTHROID, LEVOTHROID) 25 MCG tablet Take 1 tablet (25 mcg total) by mouth daily.  90 tablet  0   No current facility-administered medications on file prior to visit.    BP 116/82  Pulse 64  Temp(Src) 98.3 F (36.8 C) (Oral)  Resp 16  Ht 5\' 5"  (1.651 m)  Wt 200 lb 1.3 oz (90.756 kg)  BMI 33.30 kg/m2  SpO2 99%    Objective:  Physical Exam  Constitutional: She is oriented to person, place, and time. She appears well-developed and well-nourished. No distress.  Neurological: She is alert and oriented to person, place, and time.  Psychiatric: Her behavior is normal. Judgment and thought content normal.  Brief moment of tearfulness during interview          Assessment & Plan:  15 minutes spent with pt. >50% of this time was spent counseling pt on anxiety

## 2013-08-30 NOTE — Assessment & Plan Note (Signed)
Clinically stable.  Continue current dose of synthroid.

## 2014-03-10 ENCOUNTER — Encounter: Payer: Self-pay | Admitting: Internal Medicine

## 2014-05-27 ENCOUNTER — Emergency Department (HOSPITAL_BASED_OUTPATIENT_CLINIC_OR_DEPARTMENT_OTHER)
Admission: EM | Admit: 2014-05-27 | Discharge: 2014-05-27 | Disposition: A | Discharge status: Home / Self Care | Payer: Managed Care, Other (non HMO) | Attending: Emergency Medicine | Admitting: Emergency Medicine

## 2014-05-27 ENCOUNTER — Emergency Department (HOSPITAL_BASED_OUTPATIENT_CLINIC_OR_DEPARTMENT_OTHER): Payer: Managed Care, Other (non HMO)

## 2014-05-27 ENCOUNTER — Encounter (HOSPITAL_BASED_OUTPATIENT_CLINIC_OR_DEPARTMENT_OTHER): Payer: Self-pay | Admitting: Emergency Medicine

## 2014-05-27 DIAGNOSIS — Z86010 Personal history of colonic polyps: Secondary | ICD-10-CM | POA: Diagnosis not present

## 2014-05-27 DIAGNOSIS — S161XXA Strain of muscle, fascia and tendon at neck level, initial encounter: Secondary | ICD-10-CM

## 2014-05-27 DIAGNOSIS — Y92410 Unspecified street and highway as the place of occurrence of the external cause: Secondary | ICD-10-CM | POA: Insufficient documentation

## 2014-05-27 DIAGNOSIS — E079 Disorder of thyroid, unspecified: Secondary | ICD-10-CM | POA: Insufficient documentation

## 2014-05-27 DIAGNOSIS — S60222A Contusion of left hand, initial encounter: Secondary | ICD-10-CM | POA: Diagnosis not present

## 2014-05-27 DIAGNOSIS — Y9389 Activity, other specified: Secondary | ICD-10-CM | POA: Diagnosis not present

## 2014-05-27 DIAGNOSIS — M25561 Pain in right knee: Secondary | ICD-10-CM

## 2014-05-27 DIAGNOSIS — S199XXA Unspecified injury of neck, initial encounter: Secondary | ICD-10-CM | POA: Diagnosis present

## 2014-05-27 DIAGNOSIS — Z79899 Other long term (current) drug therapy: Secondary | ICD-10-CM | POA: Diagnosis not present

## 2014-05-27 DIAGNOSIS — Z7951 Long term (current) use of inhaled steroids: Secondary | ICD-10-CM | POA: Insufficient documentation

## 2014-05-27 DIAGNOSIS — S8991XA Unspecified injury of right lower leg, initial encounter: Secondary | ICD-10-CM | POA: Insufficient documentation

## 2014-05-27 MED ORDER — OXYCODONE-ACETAMINOPHEN 5-325 MG PO TABS: 1.0000 | ORAL_TABLET | Freq: Four times a day (QID) | ORAL | Status: DC | PRN

## 2014-05-27 MED ORDER — IBUPROFEN 600 MG PO TABS: 600.0000 mg | ORAL_TABLET | Freq: Four times a day (QID) | ORAL | Status: DC | PRN

## 2014-05-27 MED ORDER — OXYCODONE-ACETAMINOPHEN 5-325 MG PO TABS
1.0000 | ORAL_TABLET | Freq: Once | ORAL | Status: AC
  Administered 2014-05-27: 1 via ORAL
  Filled 2014-05-27: qty 1
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
## 2014-05-27 NOTE — ED Notes (Signed)
Patient transported to X-ray 
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
## 2014-05-27 NOTE — ED Notes (Signed)
Patient returned from X-ray 

## 2014-05-27 NOTE — Discharge Instructions (Signed)
Return to the ED with any concerns including chest pain or abdominal pain, difficulty breathing or vomiting, weakness of arms or legs, decreased level of alertness/lethargy, or any other alarming symptoms

## 2014-05-30 ENCOUNTER — Ambulatory Visit (HOSPITAL_BASED_OUTPATIENT_CLINIC_OR_DEPARTMENT_OTHER)
Admission: RE | Admit: 2014-05-30 | Discharge: 2014-05-30 | Disposition: A | Discharge status: Home / Self Care | Payer: Managed Care, Other (non HMO) | Source: Ambulatory Visit | Attending: Medical | Admitting: Medical

## 2014-05-30 ENCOUNTER — Encounter: Payer: Self-pay | Admitting: Medical

## 2014-05-30 ENCOUNTER — Ambulatory Visit (INDEPENDENT_AMBULATORY_CARE_PROVIDER_SITE_OTHER): Payer: Managed Care, Other (non HMO) | Admitting: Medical

## 2014-05-30 VITALS — BP 128/83 | HR 87 | Temp 99.1°F | Ht 65.5 in | Wt 207.6 lb

## 2014-05-30 DIAGNOSIS — S60222A Contusion of left hand, initial encounter: Secondary | ICD-10-CM | POA: Diagnosis not present

## 2014-05-30 DIAGNOSIS — M7989 Other specified soft tissue disorders: Secondary | ICD-10-CM | POA: Diagnosis not present

## 2014-05-30 DIAGNOSIS — M25551 Pain in right hip: Secondary | ICD-10-CM

## 2014-05-30 DIAGNOSIS — M79642 Pain in left hand: Secondary | ICD-10-CM

## 2014-05-30 DIAGNOSIS — M79641 Pain in right hand: Secondary | ICD-10-CM | POA: Insufficient documentation

## 2014-05-30 DIAGNOSIS — M25561 Pain in right knee: Secondary | ICD-10-CM | POA: Diagnosis present

## 2014-05-30 DIAGNOSIS — M25559 Pain in unspecified hip: Secondary | ICD-10-CM | POA: Insufficient documentation

## 2014-05-30 MED ORDER — DICLOFENAC SODIUM 75 MG PO TBEC: 75.0000 mg | DELAYED_RELEASE_TABLET | Freq: Two times a day (BID) | ORAL | Status: DC

## 2014-05-30 MED ORDER — TRAMADOL HCL 50 MG PO TABS: 50.0000 mg | ORAL_TABLET | Freq: Three times a day (TID) | ORAL | Status: DC | PRN
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
## 2014-05-30 NOTE — Assessment & Plan Note (Signed)
Xray of rt hand was negative.
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
## 2014-05-30 NOTE — Assessment & Plan Note (Signed)
Negative for fx. So likely soft tissue pain.
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
## 2014-05-30 NOTE — Progress Notes (Signed)
Pre visit review using our clinic review tool, if applicable. No additional management support is needed unless otherwise documented below in the visit note. 

## 2014-05-31 ENCOUNTER — Telehealth: Payer: Self-pay | Admitting: Family

## 2014-05-31 NOTE — Telephone Encounter (Signed)
I put in note to contact pt. I sent note to you. Pt is asking for results. Please notify her.

## 2014-05-31 NOTE — Telephone Encounter (Signed)
Pt is wanting to know if the results are in from her xrays that were done on yesterday. Pt seen Edward.

## 2014-06-04 ENCOUNTER — Ambulatory Visit (INDEPENDENT_AMBULATORY_CARE_PROVIDER_SITE_OTHER): Payer: Managed Care, Other (non HMO) | Admitting: Family Medicine

## 2014-06-04 ENCOUNTER — Encounter: Payer: Self-pay | Admitting: Family Medicine

## 2014-06-04 DIAGNOSIS — M25532 Pain in left wrist: Secondary | ICD-10-CM

## 2014-06-04 DIAGNOSIS — M25531 Pain in right wrist: Secondary | ICD-10-CM

## 2014-06-04 MED ORDER — CYCLOBENZAPRINE HCL 10 MG PO TABS: 10.0000 mg | ORAL_TABLET | Freq: Three times a day (TID) | ORAL | Status: DC | PRN
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
## 2014-06-04 NOTE — Assessment & Plan Note (Signed)
No obvious bony injury.  Suspect ligamentous injury.  See plan as above.

## 2014-06-04 NOTE — Assessment & Plan Note (Signed)
New to provider but pt has been dealing w/ this x1 week.  No obvious bony injury but suspect soft tissue pain and swelling w/ possible ligamentous injury at multiple sites of UEs and R knee.  Continue Ultram and Diclofenac as directed.  Add Flexeril.  Reviewed that soft tissue injury can take just as long as a fracture to improve- pt was unaware of this.  Note provided for work.  If no considerable improvement by end of week, will refer to ortho.  Reviewed supportive care and red flags that should prompt return.  Pt expressed understanding and is in agreement w/ plan.

## 2014-06-04 NOTE — Patient Instructions (Signed)
Follow up by phone on Wednesday if not improving Continue the Diclofenac twice daily- take w/ food Use the Tramadol as needed for pain Use the Flexeril as needed for muscle spasm- may cause drowsiness Continue to alternate heat/ice for pain relief Try and be patient! Call with any questions or concerns Hang in there!!

## 2014-06-04 NOTE — Progress Notes (Signed)
Pre visit review using our clinic review tool, if applicable. No additional management support is needed unless otherwise documented below in the visit note. 

## 2014-06-07 ENCOUNTER — Telehealth: Payer: Self-pay | Admitting: *Deleted

## 2014-06-07 ENCOUNTER — Telehealth: Payer: Self-pay | Admitting: Family

## 2014-06-07 NOTE — Telephone Encounter (Signed)
Disability forms from [facility] received via fax. Forms filled out as much as possible and forwarded to provider. JG//CMA

## 2014-06-07 NOTE — Telephone Encounter (Signed)
Caller name: Kalila, Adkison Relation to pt: self  Call back number: [phone]   Reason for call:   muscle relaxer has helped pt is till bruise and swollen and fingers are still stiff requesting a letter extending time out of work. Pt would like to elaborate please advise.
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
## 2014-06-08 DIAGNOSIS — Z7689 Persons encountering health services in other specified circumstances: Secondary | ICD-10-CM
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
## 2014-06-08 NOTE — Telephone Encounter (Signed)
Called and spoke with patient to inform her that she can return to work on 06/20/2014, but may need an extension.  Patient stated she would like to try the muscle relaxers for the remainder of this week and will call on Monday if she needs a referral to PT. JG//CMA

## 2014-06-08 NOTE — Telephone Encounter (Signed)
A user error has taken place.

## 2014-06-08 NOTE — Telephone Encounter (Signed)
Pt wants to know what dates were put on the form.  Call back please

## 2014-06-08 NOTE — Telephone Encounter (Signed)
Called and left message on patient's cell phone informing her that forms were faxed. JG//CMA

## 2014-06-08 NOTE — Telephone Encounter (Signed)
Paperwork completed, given to Ezequiel Essex to complete.

## 2014-06-08 NOTE — Telephone Encounter (Signed)
Patient called back.  Advised of MD approval.  Wants to know what she needs to do.  Call back please

## 2014-06-08 NOTE — Telephone Encounter (Signed)
Forms along with most recent OV note faxed to [employer] at [phone]. Fax confirmation received. JG//CMA

## 2014-06-16 ENCOUNTER — Telehealth: Payer: Self-pay | Admitting: Family

## 2014-06-16 DIAGNOSIS — M79643 Pain in unspecified hand: Secondary | ICD-10-CM

## 2014-06-16 DIAGNOSIS — M25539 Pain in unspecified wrist: Secondary | ICD-10-CM

## 2014-06-16 NOTE — Telephone Encounter (Signed)
The swelling will take time (weeks to months) to completely resolve.  We can certainly refer to PT for her hand/wrist pain.  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

## 2014-06-16 NOTE — Telephone Encounter (Signed)
LMOVM to notify pt and referral placed.
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
## 2014-06-16 NOTE — Telephone Encounter (Signed)
Patient states that she has no PAL left and needs PT that has late hours after 5:30.

## 2014-06-17 ENCOUNTER — Telehealth: Payer: Self-pay | Admitting: Family

## 2014-06-17 NOTE — Telephone Encounter (Signed)
error:[id number] ° °

## 2014-06-17 NOTE — Telephone Encounter (Signed)
Forms faxed to [facility] along with OV note from 05/30/2014. JG//CMA

## 2014-06-20 ENCOUNTER — Telehealth: Payer: Self-pay | Admitting: Family

## 2014-06-20 NOTE — Telephone Encounter (Signed)
Spoke with pt. She states she went to work at [employer] today. By 12pm head felt so heavy, had tightness / throbbing in neck, has intense burning, pinprick sensation in neck and shoulders and all the way up the spine. Had to leave work by 2:30pm.  Only uses cyclobenzaprine at night due to sedating effect. Has been taking voltaren and tramadol. Will start PT in the next 1-2 weeks. Pt is supposed to work from 8:15am to [employer]. Please advise.

## 2014-06-20 NOTE — Telephone Encounter (Signed)
Caller name: Gabbie Relation to pt: self Call back number: [phone] Pharmacy:  Reason for call:    Patient states that she went to work today and made it until 2:30 but she states that her neck and head feel so heavy and want to know if this could be a side effect to the medication

## 2014-06-20 NOTE — Telephone Encounter (Signed)
Per verbal from Provider, may apply heating pad in 15 min increments and continue current medications; needs to be seen tomorrow. Notified pt and she voices understanding. Denies weakness of arms or legs. Scheduled appt with Dr Birdie Riddle 06/21/14 at [place] pt to seek care in ER if she develops numbness or weakness of extremities.

## 2014-06-21 ENCOUNTER — Encounter: Payer: Self-pay | Admitting: Family Medicine

## 2014-06-21 ENCOUNTER — Ambulatory Visit (INDEPENDENT_AMBULATORY_CARE_PROVIDER_SITE_OTHER): Payer: Managed Care, Other (non HMO) | Admitting: Family Medicine

## 2014-06-21 VITALS — BP 130/80 | HR 84 | Temp 98.1°F | Resp 16 | Wt 203.4 lb

## 2014-06-21 DIAGNOSIS — M542 Cervicalgia: Secondary | ICD-10-CM

## 2014-06-21 DIAGNOSIS — M25531 Pain in right wrist: Secondary | ICD-10-CM

## 2014-06-21 DIAGNOSIS — M25532 Pain in left wrist: Secondary | ICD-10-CM

## 2014-06-21 MED ORDER — KETOROLAC TROMETHAMINE 60 MG/2ML IM SOLN
60.0000 mg | Freq: Once | INTRAMUSCULAR | Status: AC
Start: 2014-06-21 — End: 2014-06-21
  Administered 2014-06-21: 60 mg via INTRAMUSCULAR
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
## 2014-06-21 NOTE — Progress Notes (Signed)
Pre visit review using our clinic review tool, if applicable. No additional management support is needed unless otherwise documented below in the visit note. 
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
## 2014-06-21 NOTE — Patient Instructions (Signed)
Follow up as needed We'll call you with your ortho appt Start PT as scheduled Continue the Diclofenac twice daily for inflammation- hold today due to the Toradol injection Continue the cyclobenzaprine at night (muscle relaxer) Alternate heat/ice Call with any questions or concerns Hang in there!!!
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
## 2014-07-13 ENCOUNTER — Telehealth: Payer: Self-pay | Admitting: *Deleted

## 2014-07-13 DIAGNOSIS — Z7689 Persons encountering health services in other specified circumstances: Secondary | ICD-10-CM

## 2014-07-13 NOTE — Telephone Encounter (Signed)
Received Attending Physician's Statement via fax from [place]. Forms filled out as much as possible and forwarded to Dr. Birdie Riddle. JG//CMA

## 2014-07-13 NOTE — Telephone Encounter (Signed)
Completed forms along with OV and Xray notes faxed to [employer] at [place] [phone]. JG//CMA

## 2014-10-19 ENCOUNTER — Encounter: Payer: Self-pay | Admitting: Family

## 2014-10-19 ENCOUNTER — Ambulatory Visit (INDEPENDENT_AMBULATORY_CARE_PROVIDER_SITE_OTHER): Payer: BLUE CROSS/BLUE SHIELD | Admitting: Family

## 2014-10-19 VITALS — BP 118/80 | HR 61 | Temp 98.4°F | Resp 16 | Ht 65.0 in | Wt 205.6 lb

## 2014-10-19 DIAGNOSIS — F418 Other specified anxiety disorders: Secondary | ICD-10-CM

## 2014-10-19 DIAGNOSIS — F329 Major depressive disorder, single episode, unspecified: Secondary | ICD-10-CM

## 2014-10-19 DIAGNOSIS — F419 Anxiety disorder, unspecified: Secondary | ICD-10-CM

## 2014-10-19 MED ORDER — FLUTICASONE PROPIONATE 50 MCG/ACT NA SUSP: 2.0000 | Freq: Every day | NASAL | Status: DC

## 2014-10-19 MED ORDER — DICLOFENAC SODIUM 75 MG PO TBEC: 75.0000 mg | DELAYED_RELEASE_TABLET | Freq: Two times a day (BID) | ORAL | Status: DC

## 2014-10-19 MED ORDER — LEVOTHYROXINE SODIUM 25 MCG PO TABS: 25.0000 ug | ORAL_TABLET | Freq: Every day | ORAL | Status: DC

## 2014-10-19 MED ORDER — CYCLOBENZAPRINE HCL 10 MG PO TABS: 10.0000 mg | ORAL_TABLET | Freq: Three times a day (TID) | ORAL | Status: DC | PRN

## 2014-10-19 MED ORDER — CLONAZEPAM 0.5 MG PO TABS: ORAL_TABLET | ORAL | Status: DC
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
## 2014-10-19 NOTE — Progress Notes (Signed)
Pre visit review using our clinic review tool, if applicable. No additional management support is needed unless otherwise documented below in the visit note. 

## 2014-10-19 NOTE — Patient Instructions (Signed)
Please schedule an appointment with a therapist. [place]- [phone]  Follow up in 2 months. Sooner if problems/concerns.

## 2014-11-07 ENCOUNTER — Encounter: Payer: Self-pay | Admitting: Internal Medicine

## 2015-01-12 ENCOUNTER — Encounter: Payer: Self-pay | Admitting: Internal Medicine

## 2015-11-08 ENCOUNTER — Encounter: Payer: Self-pay | Admitting: Family Medicine

## 2015-11-08 ENCOUNTER — Ambulatory Visit (INDEPENDENT_AMBULATORY_CARE_PROVIDER_SITE_OTHER): Payer: BLUE CROSS/BLUE SHIELD | Admitting: Family Medicine

## 2015-11-08 VITALS — BP 126/82 | HR 60 | Temp 98.1°F | Resp 16 | Wt 209.0 lb

## 2015-11-08 DIAGNOSIS — R29898 Other symptoms and signs involving the musculoskeletal system: Secondary | ICD-10-CM | POA: Diagnosis not present

## 2015-11-08 DIAGNOSIS — M5412 Radiculopathy, cervical region: Secondary | ICD-10-CM | POA: Diagnosis not present

## 2015-11-08 MED ORDER — CYCLOBENZAPRINE HCL 10 MG PO TABS: 10.0000 mg | ORAL_TABLET | Freq: Three times a day (TID) | ORAL | Status: DC | PRN

## 2015-11-08 MED ORDER — PREDNISONE 10 MG PO TABS: ORAL_TABLET | ORAL | Status: DC
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
## 2015-11-08 NOTE — Assessment & Plan Note (Signed)
New.  Pt has hx of neuropathy but the extent of her weakness is more than previous.  Refer to neuro for complete evaluation and tx.  Pt expressed understanding and is in agreement w/ plan.

## 2015-11-08 NOTE — Patient Instructions (Signed)
Follow up as needed We'll call you with your neuro appt Start the Prednisone as directed- take w/ food Use the flexeril for muscle spasm- may cause drowsiness HEAT! Call with any questions or concerns Hang in there!!  We'll figure this out!!

## 2015-11-08 NOTE — Progress Notes (Signed)
Pre visit review using our clinic review tool, if applicable. No additional management support is needed unless otherwise documented below in the visit note. 

## 2015-11-08 NOTE — Assessment & Plan Note (Signed)
New.  Will start Prednisone but at a lower dose than her last course in hopes of minimizing insomnia.  Start Flexeril for spasm relief.  Refer to neuro for complete evaluation and tx.  Reviewed supportive care and red flags that should prompt return.  Pt expressed understanding and is in agreement w/ plan.

## 2015-12-08 ENCOUNTER — Encounter: Payer: Self-pay | Admitting: Neurology

## 2015-12-08 ENCOUNTER — Ambulatory Visit (INDEPENDENT_AMBULATORY_CARE_PROVIDER_SITE_OTHER): Payer: BLUE CROSS/BLUE SHIELD | Admitting: Neurology

## 2015-12-08 VITALS — BP 110/80 | HR 66 | Ht 65.0 in | Wt 207.1 lb

## 2015-12-08 DIAGNOSIS — M79642 Pain in left hand: Secondary | ICD-10-CM | POA: Diagnosis not present

## 2015-12-08 DIAGNOSIS — M79641 Pain in right hand: Secondary | ICD-10-CM | POA: Diagnosis not present

## 2015-12-08 DIAGNOSIS — M792 Neuralgia and neuritis, unspecified: Secondary | ICD-10-CM | POA: Diagnosis not present

## 2015-12-08 MED ORDER — GABAPENTIN 300 MG PO CAPS: ORAL_CAPSULE | ORAL | Status: DC

## 2015-12-08 NOTE — Patient Instructions (Addendum)
1.  Start gabapentin 300mg  at bedtime for 1 week, then increase to 300mg  twice daily 2.  NCS/EMG of the right > left upper extremity  Further recommendations will be based on the results of your EMG
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
## 2015-12-26 ENCOUNTER — Telehealth: Payer: Self-pay | Admitting: Neurology

## 2015-12-26 ENCOUNTER — Ambulatory Visit (INDEPENDENT_AMBULATORY_CARE_PROVIDER_SITE_OTHER): Payer: BLUE CROSS/BLUE SHIELD | Admitting: Neurology

## 2015-12-26 DIAGNOSIS — M79642 Pain in left hand: Secondary | ICD-10-CM

## 2015-12-26 DIAGNOSIS — M79641 Pain in right hand: Secondary | ICD-10-CM

## 2015-12-26 DIAGNOSIS — M5412 Radiculopathy, cervical region: Secondary | ICD-10-CM

## 2015-12-26 NOTE — Procedures (Signed)
[facility]  [place]  [place], [place] [address] Tel: [phone] Fax:  [phone] Test Date:  12/26/2015  Patient: Shannon Quinn DOB: [birth year] Physician: Narda Amber, DO  Sex: Female Height: 5\' 4"  Ref Phys: Narda Amber, DO  ID#: [id number] Temp: 33.2C Technician: Jerilynn Mages. Dean   Patient Complaints: This is a 45 year old female referred for evaluation of bilateral hand numbness and proximal right arm radicular pain.  NCV & EMG Findings: Extensive electrodiagnostic testing of the right upper extremity and additional studies of the left shows: 1. Bilateral median, ulnar, and palmar sensory responses are within normal limits. 2. Bilateral median and ulnar motor responses are within normal limits. 3. Rapid recruitment pattern is seen in the right deltoid and infraspinatus muscles, without accompanied active denervation.  Motor unit configuration is within normal limits.  Impression: 1. Subacute C5 radiculopathy affecting the right upper extremity, mild in degree electrically. 2. There is no evidence of carpal tunnel syndrome or ulnar neuropathy affecting the upper extremities.   ___________________________ Narda Amber, DO    Nerve Conduction Studies Anti Sensory Summary Table   Site NR Peak (ms) Norm Peak (ms) P-T Amp (V) Norm P-T Amp  Left Median Anti Sensory (2nd Digit)  Wrist    2.7 <3.4 48.0 >20  Right Median Anti Sensory (2nd Digit)  33.3C  Wrist    2.8 <3.4 38.6 >20  Left Ulnar Anti Sensory (5th Digit)  Wrist    2.6 <3.1 31.9 >12  Right Ulnar Anti Sensory (5th Digit)  33.3C  Wrist    2.8 <3.1 42.3 >12   Motor Summary Table   Site NR Onset (ms) Norm Onset (ms) O-P Amp (mV) Norm O-P Amp Site1 Site2 Delta-0 (ms) Dist (cm) Vel (m/s) Norm Vel (m/s)  Left Median Motor (Abd Poll Brev)  Wrist    3.1 <3.9 6.2 >6 Elbow Wrist 3.7 22.0 59 >50  Elbow    6.8  5.9         Right Median Motor (Abd Poll Brev)  33.3C  Wrist    3.0 <3.9 6.5 >6 Elbow  Wrist 3.8 23.0 61 >50  Elbow    6.8  6.3         Left Ulnar Motor (Abd Dig Minimi)  Wrist    2.5 <3.1 7.6 >7 B Elbow Wrist 2.9 16.0 55 >50  B Elbow    5.4  7.5  A Elbow B Elbow 1.9 10.0 53 >50  A Elbow    7.3  7.5         Right Ulnar Motor (Abd Dig Minimi)  33.3C  Wrist    2.6 <3.1 8.8 >7 B Elbow Wrist 2.9 17.0 59 >50  B Elbow    5.5  8.8  A Elbow B Elbow 1.7 10.0 59 >50  A Elbow    7.2  7.9          Comparison Summary Table   Site NR Peak (ms) Norm Peak (ms) P-T Amp (V) Site1 Site2 Delta-P (ms) Norm Delta (ms)  Left Median/Ulnar Palm Comparison (Wrist - 8cm)  Median Palm    1.6 <2.2 71.7 Median Palm Ulnar Palm 0.0   Ulnar Palm    1.6 <2.2 12.2      Right Median/Ulnar Palm Comparison (Wrist - 8cm)  33.3C  Median Palm    1.5 <2.2 82.7 Median Palm Ulnar Palm 0.1   Ulnar Palm    1.6 <2.2 23.3       EMG  Side Muscle Ins Act Fibs Psw Fasc Number Recrt Dur Dur. Amp Amp. Poly Poly. Comment  Right 1stDorInt Nml Nml Nml Nml Nml Nml Nml Nml Nml Nml Nml Nml N/A  Right Ext Indicis Nml Nml Nml Nml Nml Nml Nml Nml Nml Nml Nml Nml N/A  Right PronatorTeres Nml Nml Nml Nml Nml Nml Nml Nml Nml Nml Nml Nml N/A  Right Biceps Nml Nml Nml Nml Nml Nml Nml Nml Nml Nml Nml Nml N/A  Right Triceps Nml Nml Nml Nml Nml Nml Nml Nml Nml Nml Nml Nml N/A  Right Deltoid Nml Nml Nml Nml 1- Rapid Nml Nml Nml Nml Nml Nml N/A  Right Infraspinatus Nml Nml Nml Nml 1- Rapid Nml Nml Nml Nml Nml Nml N/A      Waveforms:

## 2015-12-26 NOTE — Telephone Encounter (Signed)
Results of EMG discussed with patient which shows right cervical radiculopathy and C5. She has noticed improvement since starting gabapentin and no longer has radicular pain radiating into her hands. Physical therapy was offered, however she would like to hold off on this and less her symptoms worsen.  Jaylon Boylen K. Posey Pronto, DO

## 2016-06-09 DIAGNOSIS — H66002 Acute suppurative otitis media without spontaneous rupture of ear drum, left ear: Secondary | ICD-10-CM | POA: Diagnosis not present

## 2016-06-09 DIAGNOSIS — J0140 Acute pansinusitis, unspecified: Secondary | ICD-10-CM | POA: Diagnosis not present

## 2016-06-09 DIAGNOSIS — R05 Cough: Secondary | ICD-10-CM | POA: Diagnosis not present

## 2016-06-09 DIAGNOSIS — Z23 Encounter for immunization: Secondary | ICD-10-CM | POA: Diagnosis not present

## 2016-06-11 ENCOUNTER — Ambulatory Visit (INDEPENDENT_AMBULATORY_CARE_PROVIDER_SITE_OTHER): Payer: BLUE CROSS/BLUE SHIELD | Admitting: Family Medicine

## 2016-06-11 VITALS — BP 108/71 | HR 68 | Temp 99.4°F | Resp 16 | Ht 65.0 in | Wt 200.0 lb

## 2016-06-11 DIAGNOSIS — R112 Nausea with vomiting, unspecified: Secondary | ICD-10-CM | POA: Diagnosis not present

## 2016-06-11 DIAGNOSIS — J029 Acute pharyngitis, unspecified: Secondary | ICD-10-CM | POA: Diagnosis not present

## 2016-06-11 DIAGNOSIS — J320 Chronic maxillary sinusitis: Secondary | ICD-10-CM | POA: Diagnosis not present

## 2016-06-11 MED ORDER — AMOXICILLIN 500 MG PO CAPS: 500.0000 mg | ORAL_CAPSULE | Freq: Three times a day (TID) | ORAL | 0 refills | Status: DC

## 2016-06-11 MED ORDER — ONDANSETRON HCL 4 MG PO TABS: 4.0000 mg | ORAL_TABLET | Freq: Three times a day (TID) | ORAL | 0 refills | Status: DC | PRN

## 2016-06-11 MED ORDER — CEFTRIAXONE SODIUM 1 G IJ SOLR
1.0000 g | Freq: Once | INTRAMUSCULAR | Status: AC
  Administered 2016-06-11: 1 g via INTRAMUSCULAR

## 2016-06-11 MED ORDER — METHYLPREDNISOLONE ACETATE 80 MG/ML IJ SUSP
80.0000 mg | Freq: Once | INTRAMUSCULAR | Status: AC
  Administered 2016-06-11: 80 mg via INTRAMUSCULAR

## 2016-06-11 NOTE — Progress Notes (Signed)
Pre visit review using our clinic review tool, if applicable. No additional management support is needed unless otherwise documented below in the visit note. 

## 2016-06-11 NOTE — Patient Instructions (Signed)
You have received a steroid injection today to decrease inflammation.  You have also received an injection of Rocephin, which is similar to the amoxicillin. This will help get some antibiotic in your system so the healing process can begin.  Start Amoxicillin every 8 hours today. Take a zofran pill 30 minutes before medication so it will not cause you nausea. Hopefull you will only need to to take zofran for about 2 days until infection improved and then nausea will subside.  Rest and HYDRATE. Dehydration can cause muscle aches and nausea as well.   If you continue to worsen and cannot tolerate the oral antibiotic, then you may need to be seen in the ED.

## 2016-06-11 NOTE — Progress Notes (Signed)
Shannon Quinn , Sep 11, 1970, 45 y.o., female MRN: [id number] Patient Care Team    Relationship Specialty Notifications Start End  Debbrah Alar, NP PCP - General Internal Medicine  05/25/12     CC: Sore throat Subjective: Pt presents for an acute OV with complaints of sore throat of > 1 week  duration.  Associated symptoms include nausea, vomit, fever (tmax 101F), ear pressure/burning, throat felt swollen and tight, chills. [facility] and started on augmentin for sinus/ear infection. Pt has tried advil to ease their symptoms. She is tolerating ice chips and some water now. She has not been able to tolerate the medication.   Allergies  Allergen Reactions  . Neomycin   . Prednisone     insomnia  . Sulfonamide Derivatives    Social History  Substance Use Topics  . Smoking status: Never Smoker  . Smokeless tobacco: Never Used  . Alcohol use No   Past Medical History:  Diagnosis Date  . Colon polyps 2011   3 polpys removed  . Hypoglycemic syndrome 1996   during pregnancy  . Thyroid disease    Past Surgical History:  Procedure Laterality Date  . TUBAL LIGATION     Family History  Problem Relation Age of Onset  . Arthritis Mother   . Alcohol abuse Father   . Arthritis Father   . Hyperlipidemia Father   . Stroke Father     20 53  . Depression Father   . Diabetes Father   . Heart disease Father 53  . Diabetes Sister   . Cancer Paternal Uncle     breast cancer  . Dementia Maternal Grandmother   . Heart disease Maternal Grandfather 42  . Diabetes Paternal Grandmother   . COPD Paternal Grandfather     emphysema     Medication List       Accurate as of 06/11/16 11:20 AM. Always use your most recent med list.          clonazePAM 0.5 MG tablet Commonly known as:  KLONOPIN One tablet by mouth twice daily as needed   cyclobenzaprine 10 MG tablet Commonly known as:  FLEXERIL Take 1 tablet (10 mg total) by mouth 3 (three) times  daily as needed for muscle spasms.   diclofenac 75 MG EC tablet Commonly known as:  VOLTAREN Take 1 tablet (75 mg total) by mouth 2 (two) times daily.   fluticasone 50 MCG/ACT nasal spray Commonly known as:  FLONASE Place 2 sprays into both nostrils daily.   gabapentin 300 MG capsule Commonly known as:  NEURONTIN Take 300mg  at bedtime for 1 week, then increase to 300mg  twice daily   levothyroxine 25 MCG tablet Commonly known as:  SYNTHROID, LEVOTHROID Take 1 tablet (25 mcg total) by mouth daily.   traMADol 50 MG tablet Commonly known as:  ULTRAM Take 1 tablet (50 mg total) by mouth every 8 (eight) hours as needed.       No results found for this or any previous visit (from the past 24 hour(s)). No results found.   ROS: Negative, with the exception of above mentioned in HPI   Objective:  BP 108/71 (BP Location: Right Arm, Patient Position: Sitting, Cuff Size: Large)   Pulse 68   Temp 99.4 F (37.4 C) (Temporal)   Resp 16   Ht 5\' 5"  (1.651 m)   Wt 200 lb (90.7 kg)   SpO2 95%   BMI 33.28 kg/m  Body mass index  is 33.28 kg/m. Gen: Afebrile. No acute distress. Nontoxic in appearance, well developed, well nourished. Appears sick.  HENT: AT. [place]. Bilateral TM visualized wnl. MMM, no oral lesions. Bilateral nares with erythema. Throat with erythema, PND cobble stoning, swelling. No exudates or lesions. TTP max sinus. Hoarseness present, no cough.  or exudates.  Eyes:Pupils Equal Round Reactive to light, Extraocular movements intact,  Conjunctiva without redness, discharge or icterus. Neck/lymp/endocrine: Supple, bilateral anterior cervical and submandibular  Lymphadenopathy, TTP.  CV: RRR Chest: CTAB, no wheeze or crackles. Good air movement, normal resp effort.  Abd: Soft. NTND. BS present Skin: no rashes, purpura or petechiae.  Neuro: Normal gait. PERLA. EOMi. Alert. Oriented x3   Assessment/Plan: Shannon Quinn is a 45 y.o. female present for acute OV for  Acute  pharyngitis, unspecified etiology/ Maxillary sinusitis, unspecified chronicity - Pt appears ill today. She has not been able to tolerate PO, therefore no treatment as actually been provided for her yet.  - She has tolerated amox in the past, will DC Augmentin, start amox TID with zofran use prior to abx admin.   - IM depo medrol for swollen painful glands. Rocephin IM to expedite abx treatment, hopefully allowing improvement in symptoms and allow her to tolerate PO medication better.  - amoxicillin (AMOXIL) 500 MG capsule; Take 1 capsule (500 mg total) by mouth 3 (three) times daily.  Dispense: 30 capsule; Refill: 0 - rest, hydrate, work excuse provided for today and tomorrow.  - F/U 2 weeks, sooner if not improved. Discussed emergent care with pt. If unable to tolerate PO or abx she will need to be seen.    Nausea and vomiting, intractability of vomiting not specified, unspecified vomiting type - pt to take 30 minutes prior to abx in hopes this will allow her to tolerate medication.  - ondansetron (ZOFRAN) 4 MG tablet; Take 1 tablet (4 mg total) by mouth every 8 (eight) hours as needed for nausea or vomiting.  Dispense: 20 tablet; Refill: 0  > 25 minutes spent with patient, >50% of time spent face to face counseling patient and coordinating care.]   electronically signed by:  Howard Pouch, DO  [place]

## 2016-06-20 ENCOUNTER — Telehealth: Payer: Self-pay | Admitting: Family Medicine

## 2016-06-20 NOTE — Telephone Encounter (Signed)
Patient is better but not completely. Can she get one more round of antibiotics? [facility].

## 2016-06-21 NOTE — Telephone Encounter (Signed)
If she is not better, still having fever/chills then she should be seen for evaluation. Antibiotics stay in the system a few days past a completed course. It can take time to completely recover from an infection, even after abx completed. People are not necessarily infected during this stage, but need time completely recover.

## 2016-06-21 NOTE — Telephone Encounter (Signed)
Spoke with patient reviewed information and instructions. Patient verbalized understanding. 

## 2016-09-28 DIAGNOSIS — Z13220 Encounter for screening for lipoid disorders: Secondary | ICD-10-CM | POA: Diagnosis not present

## 2016-09-28 DIAGNOSIS — Z136 Encounter for screening for cardiovascular disorders: Secondary | ICD-10-CM | POA: Diagnosis not present

## 2016-09-28 DIAGNOSIS — Z713 Dietary counseling and surveillance: Secondary | ICD-10-CM | POA: Diagnosis not present

## 2016-12-17 ENCOUNTER — Telehealth: Payer: Self-pay | Admitting: Family

## 2016-12-17 ENCOUNTER — Ambulatory Visit (INDEPENDENT_AMBULATORY_CARE_PROVIDER_SITE_OTHER): Payer: BLUE CROSS/BLUE SHIELD | Admitting: Family Medicine

## 2016-12-17 ENCOUNTER — Telehealth: Payer: Self-pay | Admitting: Family Medicine

## 2016-12-17 ENCOUNTER — Encounter: Payer: Self-pay | Admitting: Family Medicine

## 2016-12-17 VITALS — BP 126/83 | HR 72 | Temp 97.8°F | Resp 20 | Wt 207.5 lb

## 2016-12-17 DIAGNOSIS — J3489 Other specified disorders of nose and nasal sinuses: Secondary | ICD-10-CM

## 2016-12-17 DIAGNOSIS — R0981 Nasal congestion: Secondary | ICD-10-CM

## 2016-12-17 DIAGNOSIS — J301 Allergic rhinitis due to pollen: Secondary | ICD-10-CM

## 2016-12-17 DIAGNOSIS — J029 Acute pharyngitis, unspecified: Secondary | ICD-10-CM

## 2016-12-17 DIAGNOSIS — R0683 Snoring: Secondary | ICD-10-CM | POA: Diagnosis not present

## 2016-12-17 DIAGNOSIS — R590 Localized enlarged lymph nodes: Secondary | ICD-10-CM | POA: Diagnosis not present

## 2016-12-17 MED ORDER — FLUTICASONE PROPIONATE 50 MCG/ACT NA SUSP: 2.0000 | Freq: Every day | NASAL | 6 refills | Status: DC

## 2016-12-17 MED ORDER — METHYLPREDNISOLONE ACETATE 80 MG/ML IJ SUSP
80.0000 mg | Freq: Once | INTRAMUSCULAR | Status: AC
  Administered 2016-12-17: 80 mg via INTRAMUSCULAR

## 2016-12-17 MED ORDER — AMOXICILLIN 500 MG PO CAPS: 500.0000 mg | ORAL_CAPSULE | Freq: Three times a day (TID) | ORAL | 0 refills | Status: DC

## 2016-12-17 NOTE — Telephone Encounter (Signed)
LMOVB for a call back, regarding appt.

## 2016-12-17 NOTE — Telephone Encounter (Signed)
Unfortunately my schedule is very full.  She could see Einar Pheasant or another provider but I cannot schedule at this time

## 2016-12-17 NOTE — Telephone Encounter (Signed)
Pt would like to transfer care from Debbrah Alar to Dr. Birdie Riddle due to closer to home, please advise ok to schedule.

## 2016-12-17 NOTE — Progress Notes (Signed)
Shannon Quinn , [birth year], 46 y.o., female MRN: [id number] Patient Care Team    Relationship Specialty Notifications Start End  Debbrah Alar, NP PCP - General Internal Medicine  05/25/12     Chief Complaint  Patient presents with  . Adenopathy    neck x 2 days painful     Subjective: Pt presents for an OV with complaints of sore lymph nodes of 2-3 days duration.  Associated symptoms include sinus headache, bilateral ear fullness and drainage, sore throat, swollen glands and possibly mild nausea. She reports the left anterior portion of her neck is swollen and painful. She states she has been "always told she has large tonsils" and she has had frequent infections involving sore throat in the past. She endorses routinely having "difficulty breathing" and snoring, both that require her to lay on a certain side to resolve. She denies vomit, diarrhea or rash. She believes she has a low grade fever. She has tried to restart her zyrtec.  She reports her symptoms continue to worsen.  Pt had a similar episode in October 2017, that progressed quickly to a severe illness for her. No flowsheet data found.  Allergies  Allergen Reactions  . Neomycin   . Prednisone     insomnia  . Sulfonamide Derivatives    Social History  Substance Use Topics  . Smoking status: Never Smoker  . Smokeless tobacco: Never Used  . Alcohol use No   Past Medical History:  Diagnosis Date  . Colon polyps 2011   3 polpys removed  . Hypoglycemic syndrome 1996   during pregnancy  . Thyroid disease    Past Surgical History:  Procedure Laterality Date  . TUBAL LIGATION     Family History  Problem Relation Age of Onset  . Arthritis Mother   . Alcohol abuse Father   . Arthritis Father   . Hyperlipidemia Father   . Stroke Father     2012  . Depression Father   . Diabetes Father   . Heart disease Father 63  . Diabetes Sister   . Dementia Maternal Grandmother   . Heart disease Maternal Grandfather 42   . Diabetes Paternal Grandmother   . COPD Paternal Grandfather     emphysema  . Cancer Paternal Uncle     breast cancer   Allergies as of 12/17/2016      Reactions   Neomycin    Prednisone    insomnia   Sulfonamide Derivatives       Medication List       Accurate as of 12/17/16  9:37 AM. Always use your most recent med list.          clonazePAM 0.5 MG tablet Commonly known as:  KLONOPIN One tablet by mouth twice daily as needed   cyclobenzaprine 10 MG tablet Commonly known as:  FLEXERIL Take 1 tablet (10 mg total) by mouth 3 (three) times daily as needed for muscle spasms.   fluticasone 50 MCG/ACT nasal spray Commonly known as:  FLONASE Place 2 sprays into both nostrils daily.   levothyroxine 25 MCG tablet Commonly known as:  SYNTHROID, LEVOTHROID Take 1 tablet (25 mcg total) by mouth daily.       All past medical history, surgical history, allergies, family history, immunizations andmedications were updated in the EMR today and reviewed under the history and medication portions of their EMR.     ROS: Negative, with the exception of above mentioned in HPI   Objective:  BP 126/83 (BP  Location: Right Arm, Patient Position: Sitting, Cuff Size: Large)   Pulse 72   Temp 97.8 F (36.6 C)   Resp 20   Wt 207 lb 8 oz (94.1 kg)   SpO2 99%   BMI 34.53 kg/m  Body mass index is 34.53 kg/m. Gen: Afebrile. No acute distress. Nontoxic in appearance, well developed, well nourished.  HENT: AT. [place]. Bilateral TM visualized without erythema or bulging.. MMM, no oral lesions. Bilateral nares with moderate erythema, swelling and mild drainage. Throat with moderate erythema, no  exudates. Peritonsillar area is moderately swollen bilaterally. Hoarseness present.  Eyes:Pupils Equal Round Reactive to light, Extraocular movements intact,  Conjunctiva without redness, discharge or icterus. Neck/lymp/endocrine: Supple, moderate to severe bilateral cervical and submental  lymphadenopathy CV: RRR  Chest: CTAB, no wheeze or crackles. Good air movement, normal resp effort.  Skin: no rashes, purpura or petechiae.  Neuro: Normal gait. PERLA. EOMi. Alert. Oriented x3   No exam data present No results found. No results found for this or any previous visit (from the past 24 hour(s)).  Assessment/Plan: Shannon Quinn is a 46 y.o. female present for OV for  Pharyngitis, unspecified etiology Lymphadenopathy, cervical - amoxicillin (AMOXIL) 500 MG capsule; Take 1 capsule (500 mg total) by mouth 3 (three) times daily.  Dispense: 30 capsule; Refill: 0--> Augmentin caused severe nausea/vomit--> amox is ok.  - Ambulatory referral to ENT - methylPREDNISolone acetate (DEPO-MEDROL) injection 80 mg; Inject 1 mL (80 mg total) into the muscle once.--> can not tolerate prednisone.  Rest, hydrate, flonase, zyrtec. .  Work note provided for today.  Tylenol/advil for comfort. Chloraseptic/salt water gargle.  F/u PRN, sooner if worsening.   Snoring/repeat pharyngitis - with repeat infections, h/o snoring and "difficulties breathing" secondary to feeling her throat is swollen (chronic)--> will refer to ENT for further evaluation.  - Ambulatory referral to ENT  Seasonal allergic rhinitis due to pollen - continue zyrtec regularly.  - fluticasone (FLONASE) 50 MCG/ACT nasal spray; Place 2 sprays into both nostrils daily.  Dispense: 16 g; Refill: 6   Reviewed expectations re: course of current medical issues.  Discussed self-management of symptoms.  Outlined signs and symptoms indicating need for more acute intervention.  Patient verbalized understanding and all questions were answered.  Patient received an After-Visit Summary.     Note is dictated utilizing voice recognition software. Although note has been proof read prior to signing, occasional typographical errors still can be missed. If any questions arise, please do not hesitate to call for verification.    electronically signed by:  Howard Pouch, DO  [place]

## 2016-12-17 NOTE — Telephone Encounter (Signed)
Ok with me 

## 2016-12-17 NOTE — Patient Instructions (Addendum)
Amoxicillin 500 mg (every 8 hours) x 10 days Steroid injection today.  Rest, hydrate, flonase, zyrtec.  ENT referral for chronic neck discomfort/snoring/breathing difficulties.  Work note provided for today.  Tylenol/advil for comfort.   Gargle with salt wate, use chloraseptic for comfort.   Pharyngitis Pharyngitis is redness, pain, and swelling (inflammation) of your pharynx. What are the causes? Pharyngitis is usually caused by infection. Most of the time, these infections are from viruses (viral) and are part of a cold. However, sometimes pharyngitis is caused by bacteria (bacterial). Pharyngitis can also be caused by allergies. Viral pharyngitis may be spread from person to person by coughing, sneezing, and personal items or utensils (cups, forks, spoons, toothbrushes). Bacterial pharyngitis may be spread from person to person by more intimate contact, such as kissing. What are the signs or symptoms? Symptoms of pharyngitis include:  Sore throat.  Tiredness (fatigue).  Low-grade fever.  Headache.  Joint pain and muscle aches.  Skin rashes.  Swollen lymph nodes.  Plaque-like film on throat or tonsils (often seen with bacterial pharyngitis). How is this diagnosed? Your health care provider will ask you questions about your illness and your symptoms. Your medical history, along with a physical exam, is often all that is needed to diagnose pharyngitis. Sometimes, a rapid strep test is done. Other lab tests may also be done, depending on the suspected cause. How is this treated? Viral pharyngitis will usually get better in 3-4 days without the use of medicine. Bacterial pharyngitis is treated with medicines that kill germs (antibiotics). Follow these instructions at home:  Drink enough water and fluids to keep your urine clear or pale yellow.  Only take over-the-counter or prescription medicines as directed by your health care provider:  If you are prescribed antibiotics, make  sure you finish them even if you start to feel better.  Do not take aspirin.  Get lots of rest.  Gargle with 8 oz of salt water ( tsp of salt per 1 qt of water) as often as every 1-2 hours to soothe your throat.  Throat lozenges (if you are not at risk for choking) or sprays may be used to soothe your throat. Contact a health care provider if:  You have large, tender lumps in your neck.  You have a rash.  You cough up green, yellow-brown, or bloody spit. Get help right away if:  Your neck becomes stiff.  You drool or are unable to swallow liquids.  You vomit or are unable to keep medicines or liquids down.  You have severe pain that does not go away with the use of recommended medicines.  You have trouble breathing (not caused by a stuffy nose). This information is not intended to replace advice given to you by your health care provider. Make sure you discuss any questions you have with your health care provider. Document Released: 07/29/2005 Document Revised: 01/04/2016 Document Reviewed: 04/05/2013 Elsevier Interactive Patient Education  2017 [employer].

## 2016-12-17 NOTE — Telephone Encounter (Signed)
Patient Name: Shannon Quinn  DOB: 01-Jun-1971    Initial Comment Caller states wants appt, has swelling below ear in neck, that hurts really bad. Hot to touch. thinks it's her lymph nodes   Nurse Assessment  Nurse: Raphael Gibney, RN, Vanita Ingles Date/Time (Eastern Time): 12/17/2016 8:58:35 AM  Confirm and document reason for call. If symptomatic, describe symptoms. ---Caller states she already has appt with Dr. Raoul Pitch.  Does the patient have any new or worsening symptoms? ---Yes  Will a triage be completed? ---No  Select reason for no triage. ---Patient declined  Please document clinical information provided and list any resource used. ---Pt states she already has appt with Dr. Raoul Pitch     Guidelines    Guideline Title Affirmed Question Affirmed Notes       Final Disposition User   Clinical Call Raphael Gibney, RN, Vanita Ingles

## 2016-12-17 NOTE — Telephone Encounter (Signed)
FYI. Pt has an appt with you today.

## 2016-12-19 DIAGNOSIS — Z01419 Encounter for gynecological examination (general) (routine) without abnormal findings: Secondary | ICD-10-CM | POA: Diagnosis not present

## 2016-12-19 DIAGNOSIS — Z1231 Encounter for screening mammogram for malignant neoplasm of breast: Secondary | ICD-10-CM | POA: Diagnosis not present

## 2016-12-19 DIAGNOSIS — Z6834 Body mass index (BMI) 34.0-34.9, adult: Secondary | ICD-10-CM | POA: Diagnosis not present

## 2016-12-19 LAB — HM PAP SMEAR: HM PAP: NEGATIVE

## 2016-12-20 ENCOUNTER — Other Ambulatory Visit: Payer: Self-pay | Admitting: Obstetrics and Gynecology

## 2016-12-20 DIAGNOSIS — N644 Mastodynia: Secondary | ICD-10-CM

## 2016-12-24 ENCOUNTER — Other Ambulatory Visit: Payer: BLUE CROSS/BLUE SHIELD

## 2017-01-02 ENCOUNTER — Ambulatory Visit
Admission: RE | Admit: 2017-01-02 | Discharge: 2017-01-02 | Disposition: A | Discharge status: Home / Self Care | Payer: BLUE CROSS/BLUE SHIELD | Source: Ambulatory Visit | Attending: Obstetrics and Gynecology | Admitting: Obstetrics and Gynecology

## 2017-01-02 DIAGNOSIS — R928 Other abnormal and inconclusive findings on diagnostic imaging of breast: Secondary | ICD-10-CM | POA: Diagnosis not present

## 2017-01-02 DIAGNOSIS — N644 Mastodynia: Secondary | ICD-10-CM

## 2017-01-29 ENCOUNTER — Ambulatory Visit (INDEPENDENT_AMBULATORY_CARE_PROVIDER_SITE_OTHER): Payer: BLUE CROSS/BLUE SHIELD | Admitting: Family Medicine

## 2017-01-29 ENCOUNTER — Encounter: Payer: Self-pay | Admitting: Family Medicine

## 2017-01-29 VITALS — BP 116/78 | HR 58 | Temp 98.0°F | Resp 20 | Wt 211.2 lb

## 2017-01-29 DIAGNOSIS — H6983 Other specified disorders of Eustachian tube, bilateral: Secondary | ICD-10-CM | POA: Diagnosis not present

## 2017-01-29 DIAGNOSIS — J301 Allergic rhinitis due to pollen: Secondary | ICD-10-CM

## 2017-01-29 MED ORDER — AZELASTINE-FLUTICASONE 137-50 MCG/ACT NA SUSP: 1.0000 | Freq: Two times a day (BID) | NASAL | 11 refills | Status: DC

## 2017-01-29 MED ORDER — LEVOCETIRIZINE DIHYDROCHLORIDE 5 MG PO TABS: 5.0000 mg | ORAL_TABLET | Freq: Every evening | ORAL | 3 refills | Status: DC

## 2017-01-29 NOTE — Telephone Encounter (Signed)
Since Dr Birdie Riddle isn't accepting patients at this time,  Pt would like to switch to DR [facility].  Okay to transfer?  Please advise.

## 2017-01-29 NOTE — Telephone Encounter (Signed)
Ok with me 

## 2017-01-29 NOTE — Patient Instructions (Signed)
Eustachian Tube Dysfunction The eustachian tube connects the middle ear to the back of the nose. It regulates air pressure in the middle ear by allowing air to move between the ear and nose. It also helps to drain fluid from the middle ear space. When the eustachian tube does not function properly, air pressure, fluid, or both can build up in the middle ear. Eustachian tube dysfunction can affect one or both ears. What are the causes? This condition happens when the eustachian tube becomes blocked or cannot open normally. This may result from:  Ear infections.  Colds and other upper respiratory infections.  Allergies.  Irritation, such as from cigarette smoke or acid from the stomach coming up into the esophagus (gastroesophageal reflux).  Sudden changes in air pressure, such as from descending in an airplane.  Abnormal growths in the nose or throat, such as nasal polyps, tumors, or enlarged tissue at the back of the throat (adenoids).  What increases the risk? This condition may be more likely to develop in people who smoke and people who are overweight. Eustachian tube dysfunction may also be more likely to develop in children, especially children who have:  Certain birth defects of the mouth, such as cleft palate.  Large tonsils and adenoids.  What are the signs or symptoms? Symptoms of this condition may include:  A feeling of fullness in the ear.  Ear pain.  Clicking or popping noises in the ear.  Ringing in the ear.  Hearing loss.  Loss of balance.  Symptoms may get worse when the air pressure around you changes, such as when you travel to an area of high elevation or fly on an airplane. How is this diagnosed? This condition may be diagnosed based on:  Your symptoms.  A physical exam of your ear, nose, and throat.  Tests, such as those that measure: ? The movement of your eardrum (tympanogram). ? Your hearing (audiometry).  How is this treated? Treatment  depends on the cause and severity of your condition. If your symptoms are mild, you may be able to relieve your symptoms by moving air into ("popping") your ears. If you have symptoms of fluid in your ears, treatment may include:  Decongestants.  Antihistamines.  Nasal sprays or ear drops that contain medicines that reduce swelling (steroids).  In some cases, you may need to have a procedure to drain the fluid in your eardrum (myringotomy). In this procedure, a small tube is placed in the eardrum to:  Drain the fluid.  Restore the air in the middle ear space.  Follow these instructions at home:  Take over-the-counter and prescription medicines only as told by your health care provider.  Use techniques to help pop your ears as recommended by your health care provider. These may include: ? Chewing gum. ? Yawning. ? Frequent, forceful swallowing. ? Closing your mouth, holding your nose closed, and gently blowing as if you are trying to blow air out of your nose.  Do not do any of the following until your health care provider approves: ? Travel to high altitudes. ? Fly in airplanes. ? Work in a pressurized cabin or room. ? Scuba dive.  Keep your ears dry. Dry your ears completely after showering or bathing.  Do not smoke.  Keep all follow-up visits as told by your health care provider. This is important. Contact a health care provider if:  Your symptoms do not go away after treatment.  Your symptoms come back after treatment.  You are   unable to pop your ears.  You have: ? A fever. ? Pain in your ear. ? Pain in your head or neck. ? Fluid draining from your ear.  Your hearing suddenly changes.  You become very dizzy.  You lose your balance. This information is not intended to replace advice given to you by your health care provider. Make sure you discuss any questions you have with your health care provider. Document Released: 08/25/2015 Document Revised: 01/04/2016  Document Reviewed: 08/17/2014 Elsevier Interactive Patient Education  2018 [employer] Inc[employer]  

## 2017-01-29 NOTE — Progress Notes (Signed)
Shannon Quinn , 1971/04/20, 46 y.o., female MRN: [id number] Patient Care Team    Relationship Specialty Notifications Start End  Shannon Alar, NP PCP - General Internal Medicine  05/25/12     Chief Complaint  Patient presents with  . Ear Pain    bilateral x 4 days     Subjective:  Pt presents today with complaints of bilateral ear discomfort. She was seen about 1.5 months ago with similar complaint with signs of pharyngitis, tonsillitis and lymphadenopathy on that exam. She respsonded well to abx and steroid injection. She had been using the zyrtec and flonase as prescribed. She referred to ENT at that time since this seems to be a repetitive issue for her(see note below). Currently she feels pain in her ears, sometimes down her neck. Her ears feel "wet" inside, she occassionally feels dizzy.   Prior note:  Pt presents for an OV with complaints of sore lymph nodes of 2-3 days duration.  Associated symptoms include sinus headache, bilateral ear fullness and drainage, sore throat, swollen glands and possibly mild nausea. She reports the left anterior portion of her neck is swollen and painful. She states she has been "always told she has large tonsils" and she has had frequent infections involving sore throat in the past. She endorses routinely having "difficulty breathing" and snoring, both that require her to lay on a certain side to resolve. She denies vomit, diarrhea or rash. She believes she has a low grade fever. She has tried to restart her zyrtec.  She reports her symptoms continue to worsen.  Pt had a similar episode in October 2017, that progressed quickly to a severe illness for her. No flowsheet data found.  Allergies  Allergen Reactions  . Augmentin [Amoxicillin-Pot Clavulanate] Nausea And Vomiting    amox ok.   . Neomycin   . Prednisone     insomnia  . Sulfonamide Derivatives    Social History  Substance Use Topics  . Smoking status: Never Smoker  . Smokeless  tobacco: Never Used  . Alcohol use No   Past Medical History:  Diagnosis Date  . Colon polyps 2011   3 polpys removed  . Hypoglycemic syndrome 1996   during pregnancy  . Thyroid disease    Past Surgical History:  Procedure Laterality Date  . TUBAL LIGATION     Family History  Problem Relation Age of Onset  . Arthritis Mother   . Alcohol abuse Father   . Arthritis Father   . Hyperlipidemia Father   . Stroke Father        2012  . Depression Father   . Diabetes Father   . Heart disease Father 21  . Diabetes Sister   . Dementia Maternal Grandmother   . Heart disease Maternal Grandfather 42  . Diabetes Paternal Grandmother   . COPD Paternal Grandfather        emphysema  . Cancer Paternal Uncle        breast cancer   Allergies as of 01/29/2017      Reactions   Augmentin [amoxicillin-pot Clavulanate] Nausea And Vomiting   amox ok.    Neomycin    Prednisone    insomnia   Sulfonamide Derivatives       Medication List       Accurate as of 01/29/17  1:44 PM. Always use your most recent med list.          clonazePAM 0.5 MG tablet Commonly known as:  KLONOPIN One tablet by  mouth twice daily as needed   cyclobenzaprine 10 MG tablet Commonly known as:  FLEXERIL Take 1 tablet (10 mg total) by mouth 3 (three) times daily as needed for muscle spasms.   fluticasone 50 MCG/ACT nasal spray Commonly known as:  FLONASE Place 2 sprays into both nostrils daily.   levothyroxine 25 MCG tablet Commonly known as:  SYNTHROID, LEVOTHROID Take 1 tablet (25 mcg total) by mouth daily.       All past medical history, surgical history, allergies, family history, immunizations andmedications were updated in the EMR today and reviewed under the history and medication portions of their EMR.     ROS: Negative, with the exception of above mentioned in HPI   Objective:  BP 116/78 (BP Location: Left Arm, Patient Position: Sitting, Cuff Size: Large)   Pulse (!) 58   Temp 98 F (36.7  C)   Resp 20   Wt 211 lb 4 oz (95.8 kg)   SpO2 97%   BMI 35.15 kg/m  Body mass index is 35.15 kg/m. Gen: Afebrile. No acute distress. Nontoxic, pleasant caucasian female.  HENT: AT. [place]. Bilateral TM visualized and normal in appearance, mild fullness. EAM normal.  MMM. Bilateral nares with moderate swelling and erythema. Throat without erythema or exudates. Mild PND. No cough or hoarseness.  Eyes:Pupils Equal Round Reactive to light, Extraocular movements intact,  Conjunctiva without redness, discharge or icterus. Neck/lymp/endocrine: Supple, no lymphadenopathy, CV: RRR  Chest: CTAB, no wheeze or crackles Neuro: Normal gait. No dizziness on exam.   No exam data present No results found. No results found for this or any previous visit (from the past 24 hour(s)).  Assessment/Plan: Shannon Quinn is a 46 y.o. female present for OV for  Allergic rhinitis due to pollen, unspecified seasonality Eustachian tube dysfunction, bilateral -  Exam normal today. Suspect Eustachian tube dysfunction.  - Switch zyrtec to xyzal.  - switch flonase to Dymista.  - [place] Maneuver demonstrated and instructed to patient.  - ENT referral has been placed prior, she will check with referral coordinator on scheduling. - Azelastine-Fluticasone 137-50 MCG/ACT SUSP; Place 1 spray into the nose 2 (two) times daily.  Dispense: 23 g; Refill: 11 - levocetirizine (XYZAL) 5 MG tablet; Take 1 tablet (5 mg total) by mouth every evening.  Dispense: 90 tablet; Refill: 3 - F/U PRN  Reviewed expectations re: course of current medical issues.  Discussed self-management of symptoms.  Outlined signs and symptoms indicating need for more acute intervention.  Patient verbalized understanding and all questions were answered.  Patient received an After-Visit Summary.   Note is dictated utilizing voice recognition software. Although note has been proof read prior to signing, occasional typographical errors still can be  missed. If any questions arise, please do not hesitate to call for verification.   electronically signed by:  Howard Pouch, DO  [place]

## 2017-03-03 DIAGNOSIS — J342 Deviated nasal septum: Secondary | ICD-10-CM | POA: Diagnosis not present

## 2017-03-03 DIAGNOSIS — R51 Headache: Secondary | ICD-10-CM | POA: Diagnosis not present

## 2017-03-03 DIAGNOSIS — H6983 Other specified disorders of Eustachian tube, bilateral: Secondary | ICD-10-CM | POA: Diagnosis not present

## 2017-03-03 DIAGNOSIS — J302 Other seasonal allergic rhinitis: Secondary | ICD-10-CM | POA: Diagnosis not present

## 2017-04-15 DIAGNOSIS — J322 Chronic ethmoidal sinusitis: Secondary | ICD-10-CM | POA: Diagnosis not present

## 2017-04-15 DIAGNOSIS — R51 Headache: Secondary | ICD-10-CM | POA: Diagnosis not present

## 2017-04-15 DIAGNOSIS — J343 Hypertrophy of nasal turbinates: Secondary | ICD-10-CM | POA: Insufficient documentation

## 2017-04-15 DIAGNOSIS — J323 Chronic sphenoidal sinusitis: Secondary | ICD-10-CM | POA: Diagnosis not present

## 2017-04-15 DIAGNOSIS — J3489 Other specified disorders of nose and nasal sinuses: Secondary | ICD-10-CM | POA: Diagnosis not present

## 2017-04-15 DIAGNOSIS — H6983 Other specified disorders of Eustachian tube, bilateral: Secondary | ICD-10-CM | POA: Diagnosis not present

## 2017-04-15 DIAGNOSIS — J342 Deviated nasal septum: Secondary | ICD-10-CM | POA: Diagnosis not present

## 2017-08-27 DIAGNOSIS — N920 Excessive and frequent menstruation with regular cycle: Secondary | ICD-10-CM | POA: Diagnosis not present

## 2017-08-30 DIAGNOSIS — Z136 Encounter for screening for cardiovascular disorders: Secondary | ICD-10-CM | POA: Diagnosis not present

## 2017-08-30 DIAGNOSIS — Z13220 Encounter for screening for lipoid disorders: Secondary | ICD-10-CM | POA: Diagnosis not present

## 2017-08-30 DIAGNOSIS — Z131 Encounter for screening for diabetes mellitus: Secondary | ICD-10-CM | POA: Diagnosis not present

## 2017-08-30 LAB — LIPID PANEL
Cholesterol: 180 (ref 0–200)
HDL: 32 — AB (ref 35–70)
LDL CALC: 99
LDl/HDL Ratio: 4.5
Triglycerides: 138 (ref 40–160)

## 2017-08-30 LAB — BASIC METABOLIC PANEL: GLUCOSE: 87

## 2017-09-04 ENCOUNTER — Other Ambulatory Visit: Payer: Self-pay | Admitting: Obstetrics and Gynecology

## 2017-09-04 DIAGNOSIS — N888 Other specified noninflammatory disorders of cervix uteri: Secondary | ICD-10-CM | POA: Diagnosis not present

## 2017-09-04 DIAGNOSIS — N920 Excessive and frequent menstruation with regular cycle: Secondary | ICD-10-CM | POA: Diagnosis not present

## 2017-09-04 DIAGNOSIS — D251 Intramural leiomyoma of uterus: Secondary | ICD-10-CM | POA: Diagnosis not present

## 2017-09-04 DIAGNOSIS — N8301 Follicular cyst of right ovary: Secondary | ICD-10-CM | POA: Diagnosis not present

## 2017-09-04 DIAGNOSIS — N736 Female pelvic peritoneal adhesions (postinfective): Secondary | ICD-10-CM | POA: Diagnosis not present

## 2017-09-04 DIAGNOSIS — N838 Other noninflammatory disorders of ovary, fallopian tube and broad ligament: Secondary | ICD-10-CM | POA: Diagnosis not present

## 2017-09-04 DIAGNOSIS — N8302 Follicular cyst of left ovary: Secondary | ICD-10-CM | POA: Diagnosis not present

## 2017-09-04 DIAGNOSIS — N921 Excessive and frequent menstruation with irregular cycle: Secondary | ICD-10-CM | POA: Diagnosis not present

## 2017-09-04 DIAGNOSIS — N800 Endometriosis of uterus: Secondary | ICD-10-CM | POA: Diagnosis not present

## 2017-09-04 HISTORY — PX: LAPAROSCOPIC TOTAL HYSTERECTOMY: SUR800

## 2017-10-08 DIAGNOSIS — N390 Urinary tract infection, site not specified: Secondary | ICD-10-CM | POA: Diagnosis not present

## 2017-10-08 DIAGNOSIS — Z09 Encounter for follow-up examination after completed treatment for conditions other than malignant neoplasm: Secondary | ICD-10-CM | POA: Diagnosis not present

## 2017-10-08 DIAGNOSIS — R319 Hematuria, unspecified: Secondary | ICD-10-CM | POA: Diagnosis not present

## 2017-11-03 ENCOUNTER — Ambulatory Visit: Payer: BLUE CROSS/BLUE SHIELD | Admitting: Family Medicine

## 2017-11-03 ENCOUNTER — Encounter: Payer: Self-pay | Admitting: Family Medicine

## 2017-11-03 ENCOUNTER — Ambulatory Visit (INDEPENDENT_AMBULATORY_CARE_PROVIDER_SITE_OTHER): Payer: BLUE CROSS/BLUE SHIELD

## 2017-11-03 VITALS — BP 110/77 | HR 61 | Temp 97.6°F | Ht 65.0 in | Wt 217.8 lb

## 2017-11-03 DIAGNOSIS — R1084 Generalized abdominal pain: Secondary | ICD-10-CM

## 2017-11-03 DIAGNOSIS — Z90710 Acquired absence of both cervix and uterus: Secondary | ICD-10-CM

## 2017-11-03 DIAGNOSIS — R109 Unspecified abdominal pain: Secondary | ICD-10-CM | POA: Diagnosis not present

## 2017-11-03 LAB — CBC WITH DIFFERENTIAL/PLATELET
Basophils Absolute: 0.1 10*3/uL (ref 0.0–0.1)
Basophils Relative: 1.1 % (ref 0.0–3.0)
EOS PCT: 3.4 % (ref 0.0–5.0)
Eosinophils Absolute: 0.3 10*3/uL (ref 0.0–0.7)
HCT: 40.8 % (ref 36.0–46.0)
Hemoglobin: 13.7 g/dL (ref 12.0–15.0)
Lymphocytes Relative: 34.1 % (ref 12.0–46.0)
Lymphs Abs: 2.6 10*3/uL (ref 0.7–4.0)
MCHC: 33.4 g/dL (ref 30.0–36.0)
MCV: 88.1 fl (ref 78.0–100.0)
MONOS PCT: 5.7 % (ref 3.0–12.0)
Monocytes Absolute: 0.4 10*3/uL (ref 0.1–1.0)
NEUTROS PCT: 55.7 % (ref 43.0–77.0)
Neutro Abs: 4.2 10*3/uL (ref 1.4–7.7)
Platelets: 325 10*3/uL (ref 150.0–400.0)
RBC: 4.64 Mil/uL (ref 3.87–5.11)
RDW: 14.2 % (ref 11.5–15.5)
WBC: 7.5 10*3/uL (ref 4.0–10.5)

## 2017-11-03 LAB — COMPREHENSIVE METABOLIC PANEL
ALBUMIN: 3.9 g/dL (ref 3.5–5.2)
ALT: 42 U/L — ABNORMAL HIGH (ref 0–35)
AST: 26 U/L (ref 0–37)
Alkaline Phosphatase: 70 U/L (ref 39–117)
BUN: 9 mg/dL (ref 6–23)
CHLORIDE: 105 meq/L (ref 96–112)
CO2: 30 mEq/L (ref 19–32)
Calcium: 9.5 mg/dL (ref 8.4–10.5)
Creatinine, Ser: 0.74 mg/dL (ref 0.40–1.20)
GFR: 88.79 mL/min (ref >60.00)
GLUCOSE: 105 mg/dL — AB (ref 70–99)
Potassium: 4.1 mEq/L (ref 3.5–5.1)
Sodium: 140 mEq/L (ref 135–145)
Total Bilirubin: 0.4 mg/dL (ref 0.2–1.2)
Total Protein: 7.2 g/dL (ref 6.0–8.3)

## 2017-11-03 MED ORDER — TRAMADOL HCL 50 MG PO TABS: 50.0000 mg | ORAL_TABLET | Freq: Three times a day (TID) | ORAL | 0 refills | Status: DC | PRN

## 2017-11-03 NOTE — Patient Instructions (Signed)
We will get office notes from Dr. Curlene Labrum. In the meantime get abdominal xray today at [facility].  Tramadol for severe pain only. Short term only.  Wear binder/Girdle for comfort daily.  After xray results and notes received, will decide on further imaging if needed.

## 2017-11-03 NOTE — Progress Notes (Signed)
[name] , [birth year], 47 y.o., female MRN: [id number] Patient Care Team    Relationship Specialty Notifications Start End  Ma Hillock, DO PCP - General Family Medicine  11/03/17   [facility]    11/03/17   Everlene Farrier, MD Consulting Physician Obstetrics and Gynecology  11/03/17     Chief Complaint  Patient presents with  . Abdominal Pain    Pt c/o of lower quandrant abdominal pain that is rated 5/10, nothing makes the pain better but trigger with laughing, wallking and standing for a long time. she believes the pain is due to her radical hysterectomy she had in Jan 24th 2019.     Subjective: Pt presents for an OV with complaints of lower abd pain of 9 weeks duration s/p hysterectomy.  She reports Total hysterectomy secondary to endometriosis. She has had follow up with GYN- Dr. Curlene Labrum. She has had 3 pelvic exams, last March 15/2019.  She reports some abnormal findings initially, but she is uncertain what these are, and the last pelvic was normal.  She reports the pain is frequent throughout the day, 5/10 severity, worse with laughing, walking or standing on her feet for longer periods of time.  She has been wearing a girdle to help support her abdomen and feels that is helpful some.  Has not taken much in the way of pain medications because she gets severe nausea and vomiting with Percocet.  She reports an episode of severe nausea and vomiting after surgery secondary to pain medications.  She reports regular bowel movements, with some straining on occasions.  States when she strains it hurts up around her bellybutton.  Last saw her surgeon October 24, 2017 and they felt her pain may have been secondary to a pulled muscle (per pt).  She reports she is been eating and drinking well.  Is concerned because she is supposed to go back to work and she still having pain.  She feels her pain should have been almost gone by now.  No flowsheet data found.  Allergies    Allergen Reactions  . Augmentin [Amoxicillin-Pot Clavulanate] Nausea And Vomiting    amox ok.   . Neomycin   . Prednisone     insomnia  . Sulfonamide Derivatives    Social History   Tobacco Use  . Smoking status: Never Smoker  . Smokeless tobacco: Never Used  Substance Use Topics  . Alcohol use: No    Alcohol/week: 0.0 oz   Past Medical History:  Diagnosis Date  . Colon polyps 2011   3 polpys removed  . Hypoglycemic syndrome 1996   during pregnancy  . Thyroid disease   . Varicose vein of leg    Past Surgical History:  Procedure Laterality Date  . TUBAL LIGATION     Family History  Problem Relation Age of Onset  . Arthritis Mother   . Alcohol abuse Father   . Arthritis Father   . Hyperlipidemia Father   . Stroke Father        2012  . Depression Father   . Diabetes Father   . Heart disease Father 31  . Diabetes Sister   . Dementia Maternal Grandmother   . Heart disease Maternal Grandfather 42  . Diabetes Paternal Grandmother   . COPD Paternal Grandfather        emphysema  . Cancer Paternal Uncle        breast cancer   Allergies as of 11/03/2017  Reactions   Augmentin [amoxicillin-pot Clavulanate] Nausea And Vomiting   amox ok.    Neomycin    Prednisone    insomnia   Sulfonamide Derivatives       Medication List        Accurate as of 11/03/17  5:45 PM. Always use your most recent med list.          Azelastine-Fluticasone 137-50 MCG/ACT Susp Place 1 spray into the nose 2 (two) times daily.   lidocaine 5 % Commonly known as:  LIDODERM   traMADol 50 MG tablet Commonly known as:  ULTRAM Take 1 tablet (50 mg total) by mouth every 8 (eight) hours as needed.       All past medical history, surgical history, allergies, family history, immunizations andmedications were updated in the EMR today and reviewed under the history and medication portions of their EMR.     ROS: Negative, with the exception of above mentioned in HPI   Objective:   BP 110/77 (BP Location: Left Arm, Patient Position: Sitting, Cuff Size: Large)   Pulse 61   Temp 97.6 F (36.4 C) (Oral)   Ht '5\' 5"'  (1.651 m)   Wt 217 lb 12.8 oz (98.8 kg)   LMP 08/23/2017   SpO2 96%   BMI 36.24 kg/m  Body mass index is 36.24 kg/m. Gen: Afebrile. No acute distress. Nontoxic in appearance, well developed, well nourished.  HENT: AT. Noonday.  MMM Eyes:Pupils Equal Round Reactive to light, Extraocular movements intact,  Conjunctiva without redness, discharge or icterus. CV: RRR no murmur, no edema Chest: CTAB, no wheeze or crackles. Good air movement, normal resp effort.  Abd: Soft. Well healing umbilical incision.  No obvious abdominal wall defect.  TTP bilateral lower quadrants and umbilicus. ND. BS present. stool burden palpated.  Guarding present.  No rebound present.  Negative Markel test. Skin: no rashes, purpura or petechiae.  Neuro:  Normal gait. PERLA. EOMi. Alert. Oriented x3  Psych: Normal affect, dress and demeanor. Normal speech. Normal thought content and judgment.  No exam data present No results found.  Assessment/Plan: Aneshia Jacquet is a 47 y.o. female present for OV for  Generalized abdominal pain/S/P laparoscopic hysterectomy -Unknown etiology of continued abdominal pain.  Potential diagnoses include strained muscles, constipation, hernia formation, less likely left instrumentation or hematoma.  She appears well but mildly uncomfortable.  More tender on exam than what had anticipated 9 weeks postop. -Continue abdominal binder use for support. - CBC w/Diff - Comp Met (CMET) - DG Abd 2 Views; Future -Short course of tramadol 50 mg every 8 hours provided for severe pain only.  No refills will be provided. -Follow-up dependent upon lab results and x-ray results.  Would consider advanced imaging after records received if needed.   Reviewed expectations re: course of current medical issues.  Discussed self-management of symptoms.  Outlined signs and  symptoms indicating need for more acute intervention.  Patient verbalized understanding and all questions were answered.  Patient received an After-Visit Summary.    Orders Placed This Encounter  Procedures  . DG Abd 2 Views  . CBC w/Diff  . Comp Met (CMET)     Note is dictated utilizing voice recognition software. Although note has been proof read prior to signing, occasional typographical errors still can be missed. If any questions arise, please do not hesitate to call for verification.   electronically signed by:  Howard Pouch, DO  [place]

## 2017-11-04 ENCOUNTER — Encounter: Payer: Self-pay | Admitting: *Deleted

## 2017-11-04 ENCOUNTER — Telehealth: Payer: Self-pay | Admitting: Family Medicine

## 2017-11-04 NOTE — Telephone Encounter (Signed)
Discussed xray and lab results with pt this evening. Very mildly elevated ALT, nothing to compare--> may be her baseline. Will repeat if still having discomfort in 2 weeks.   Xray with moderate stool burden, which could be potential cause of continued discomfort. Recommended 1/2 cap miralax in 8 ounces of water daily for at least 1 week. Pt is having stools daily but likely not fully emptying with abd discomfort with BM.  Awaiting records from GYN surrounding surgery. Pt to f/u 2 weeks if pain not resolved. With repeat lft and consider imaging at that time. Pt is in agreement with plan

## 2017-11-14 ENCOUNTER — Encounter: Payer: Self-pay | Admitting: Family Medicine

## 2017-12-29 DIAGNOSIS — Z01419 Encounter for gynecological examination (general) (routine) without abnormal findings: Secondary | ICD-10-CM | POA: Diagnosis not present

## 2017-12-29 DIAGNOSIS — Z1231 Encounter for screening mammogram for malignant neoplasm of breast: Secondary | ICD-10-CM | POA: Diagnosis not present

## 2017-12-29 DIAGNOSIS — Z6836 Body mass index (BMI) 36.0-36.9, adult: Secondary | ICD-10-CM | POA: Diagnosis not present

## 2018-02-26 DIAGNOSIS — L814 Other melanin hyperpigmentation: Secondary | ICD-10-CM | POA: Diagnosis not present

## 2018-02-26 DIAGNOSIS — D1801 Hemangioma of skin and subcutaneous tissue: Secondary | ICD-10-CM | POA: Diagnosis not present

## 2018-02-26 DIAGNOSIS — D485 Neoplasm of uncertain behavior of skin: Secondary | ICD-10-CM | POA: Diagnosis not present

## 2018-02-26 DIAGNOSIS — L821 Other seborrheic keratosis: Secondary | ICD-10-CM | POA: Diagnosis not present

## 2018-02-26 DIAGNOSIS — D2261 Melanocytic nevi of right upper limb, including shoulder: Secondary | ICD-10-CM | POA: Diagnosis not present

## 2018-02-26 DIAGNOSIS — D225 Melanocytic nevi of trunk: Secondary | ICD-10-CM | POA: Diagnosis not present

## 2018-02-26 DIAGNOSIS — D0359 Melanoma in situ of other part of trunk: Secondary | ICD-10-CM | POA: Diagnosis not present

## 2018-03-09 DIAGNOSIS — L080 Pyoderma: Secondary | ICD-10-CM | POA: Diagnosis not present

## 2018-03-10 DIAGNOSIS — Z6838 Body mass index (BMI) 38.0-38.9, adult: Secondary | ICD-10-CM | POA: Diagnosis not present

## 2018-03-10 DIAGNOSIS — Z90710 Acquired absence of both cervix and uterus: Secondary | ICD-10-CM | POA: Diagnosis not present

## 2018-03-10 DIAGNOSIS — E785 Hyperlipidemia, unspecified: Secondary | ICD-10-CM | POA: Diagnosis not present

## 2018-03-10 DIAGNOSIS — C4359 Malignant melanoma of other part of trunk: Secondary | ICD-10-CM | POA: Diagnosis not present

## 2018-03-11 ENCOUNTER — Encounter: Payer: Self-pay | Admitting: Family Medicine

## 2018-03-12 ENCOUNTER — Other Ambulatory Visit (HOSPITAL_COMMUNITY): Payer: Self-pay | Admitting: General Surgery

## 2018-03-12 DIAGNOSIS — C4359 Malignant melanoma of other part of trunk: Secondary | ICD-10-CM

## 2018-03-24 ENCOUNTER — Encounter (HOSPITAL_COMMUNITY)
Admission: RE | Admit: 2018-03-24 | Discharge: 2018-03-24 | Disposition: A | Discharge status: Home / Self Care | Payer: BLUE CROSS/BLUE SHIELD | Source: Ambulatory Visit | Attending: General Surgery | Admitting: General Surgery

## 2018-03-24 DIAGNOSIS — C4359 Malignant melanoma of other part of trunk: Secondary | ICD-10-CM

## 2018-03-24 MED ORDER — TECHNETIUM TC 99M SULFUR COLLOID FILTERED
0.5000 | Freq: Once | INTRAVENOUS | Status: AC | PRN
  Administered 2018-03-24: 0.5 via INTRADERMAL

## 2018-03-26 ENCOUNTER — Other Ambulatory Visit: Payer: Self-pay | Admitting: General Surgery

## 2018-03-26 ENCOUNTER — Encounter: Payer: Self-pay | Admitting: Family Medicine

## 2018-03-26 DIAGNOSIS — E785 Hyperlipidemia, unspecified: Secondary | ICD-10-CM | POA: Diagnosis not present

## 2018-03-26 DIAGNOSIS — C4359 Malignant melanoma of other part of trunk: Secondary | ICD-10-CM

## 2018-03-26 DIAGNOSIS — Z6838 Body mass index (BMI) 38.0-38.9, adult: Secondary | ICD-10-CM | POA: Diagnosis not present

## 2018-03-26 DIAGNOSIS — C439 Malignant melanoma of skin, unspecified: Secondary | ICD-10-CM | POA: Insufficient documentation

## 2018-03-28 NOTE — H&P (Signed)
Shannon Quinn Location: [facility] Patient #: [id number] DOB: 08-21-1970 Married / Language: English / Race: White Female        History of Present Illness        This is a pleasant 47 year old woman. Her husband is with her throughout the encounter. She is here for a preoperative conference regarding management of her melanoma of the left upper back. Her PCP is Forbes Cellar, D.O. She was referred by Velta Addison, PA at the skin surgery Center.       Recent shave biopsy of pigmented nevus of the left upper parasternal area on February 26, 2018 showed malignant melanoma, Breslow thickness 1.1 mm. Peripheral margins involved. Deep margins not involved. No adverse features. Initial staging T2a, and X. She has undergone lymphoscintigram and she maps to both axilla.      Comorbidities include prior nevi. BMI 38. Hysterectomy. Some arthritis of right shoulder. Family history negative for cancer.       After examining her I talked to her and her husband for almost 30 minutes. I advised her to undergo injection of blue dye, wide local excision with a transverse elliptical incision at least 1 cm margin, and bilateral axillary sentinel lymph node mapping and biopsy. I discussed the indications, details, techniques, numerous risk of the surgery with him.. There where the risk of shoulder disability, arm numbness, arm swelling, infection and bleeding, and other unforeseen problems. Benadryl that if there is positive nodes that we would need to involve a medical oncologist. We talked about long-term surveillance with dermatology every 6 months and melanoma surgeon every year indefinitely. They understand all of these issues. All the questions were answered. They agree with this plan.       I recommended ophthalmologic and retinal exam and gave her some names to seek for adequate exam.   Allergies  Sulfa Drugs  Allergies Reconciled   Medication History  Estradiol  (0.1MG /24HR Patch TW, Transdermal) Active. Medications Reconciled  Vitals  Weight: 221.8 lb Height: 64in Body Surface Area: 2.04 m Body Mass Index: 38.07 kg/m  Temp.: 98.74F  Pulse: 74 (Regular)  BP: 126/86 (Sitting, Left Arm, Standard)       Physical Exam  General Mental Status-Alert. General Appearance-Not in acute distress. Build & Nutrition-Well nourished. Posture-Normal posture. Gait-Normal.  Integumentary Note: Healing eschar left upper back 1 cm or less in size. No signs of residual melanoma. No cervical or axillary adenopathy   Head and Neck Head-normocephalic, atraumatic with no lesions or palpable masses. Trachea-midline. Thyroid Gland Characteristics - normal size and consistency and no palpable nodules.  Chest and Lung Exam Chest and lung exam reveals -on auscultation, normal breath sounds, no adventitious sounds and normal vocal resonance.  Cardiovascular Cardiovascular examination reveals -normal heart sounds, regular rate and rhythm with no murmurs and femoral artery auscultation bilaterally reveals normal pulses, no bruits, no thrills.  Abdomen Inspection Inspection of the abdomen reveals - No Hernias. Palpation/Percussion Palpation and Percussion of the abdomen reveal - Soft, Non Tender, No Rigidity (guarding), No hepatosplenomegaly and No Palpable abdominal masses.  Neurologic Neurologic evaluation reveals -alert and oriented x 3 with no impairment of recent or remote memory, normal attention span and ability to concentrate, normal sensation and normal coordination.  Musculoskeletal Normal Exam - Bilateral-Upper Extremity Strength Normal and Lower Extremity Strength Normal.    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)  Your lymphoscintigram shows that the radioactive tracer goes to lymph nodes under both arms This does  not mean that you have cancer under the arms It just means that the  area of the melanoma naturally drains to both axilla We will need to sample lymph nodes under both arms      You'll be scheduled for injection of blue dye of back, wide local excision melanoma of back, bilateral axillary sentinel lymph node mapping and biopsy This is outpatient surgery and you will go home the same day Be sure to keep your shoulders limber and stretch them frequently preop and postop We have discussed the indications, techniques, and risks of the surgery in detail with you and your husband  BMI 38.0-38.9,ADULT (Z68.38) HYPERLIPIDEMIA, MILD (E78.5)    Heela Heishman M. Dalbert Batman, M.D., [facility] General and Minimally invasive Surgery Breast and Colorectal Surgery Office:   [phone] Pager:   [phone]

## 2018-03-30 ENCOUNTER — Encounter (HOSPITAL_COMMUNITY): Payer: Self-pay | Admitting: *Deleted

## 2018-03-30 ENCOUNTER — Other Ambulatory Visit: Payer: Self-pay

## 2018-03-30 NOTE — Progress Notes (Signed)
Patient verbalized understanding of instructions.  Patient has concerns about the after effects from anesthesia.  Patient stated that she has an "exorcist" reaction from anesthesia but the patient could not give me a good description of what happened.  I am requesting records from the surgical center of [place] where her last surgery was located.  Anesthesia to review records.  Patient is also concerned about being flipped to her stomach during surgery due to a pinched nerve in her back

## 2018-03-31 ENCOUNTER — Inpatient Hospital Stay (HOSPITAL_COMMUNITY): Admission: RE | Admit: 2018-03-31 | Payer: BLUE CROSS/BLUE SHIELD | Source: Ambulatory Visit

## 2018-03-31 ENCOUNTER — Encounter (HOSPITAL_BASED_OUTPATIENT_CLINIC_OR_DEPARTMENT_OTHER)
Admission: RE | Disposition: A | Discharge status: Home / Self Care | Payer: Self-pay | Source: Ambulatory Visit | Attending: General Surgery

## 2018-03-31 SURGERY — MELANOMA EXCISION WITH SENTINEL LYMPH NODE BIOPSY
Anesthesia: General

## 2018-03-31 NOTE — Progress Notes (Signed)
Made a return phone call to the pt. Pt left a message today on my voicemail, however, could not make out what it was she needed. Instructed to call back when pt has a chance.

## 2018-03-31 NOTE — Progress Notes (Signed)
Spoke over the phone with pt and confirmed her date and time for surgery.

## 2018-04-01 NOTE — Progress Notes (Signed)
Message left with patient to ensure she had the correct date and time of surgery.  Pt to be here 04/02/18 at 1100.

## 2018-04-01 NOTE — Progress Notes (Signed)
Spoke with Dr. Gifford Shave regarding note left by Jasmine Pang on the 19th.  Pt was rescheduled at [facility] for tomorrow. Dr. Gifford Shave reviewed medical history and we will proceed with surgery for tomorrow.

## 2018-04-02 ENCOUNTER — Encounter (HOSPITAL_BASED_OUTPATIENT_CLINIC_OR_DEPARTMENT_OTHER)
Admission: RE | Disposition: A | Discharge status: Home / Self Care | Payer: Self-pay | Source: Ambulatory Visit | Attending: General Surgery

## 2018-04-02 ENCOUNTER — Other Ambulatory Visit: Payer: Self-pay

## 2018-04-02 ENCOUNTER — Ambulatory Visit (HOSPITAL_BASED_OUTPATIENT_CLINIC_OR_DEPARTMENT_OTHER): Payer: BLUE CROSS/BLUE SHIELD | Admitting: Anesthesiology

## 2018-04-02 ENCOUNTER — Encounter (HOSPITAL_BASED_OUTPATIENT_CLINIC_OR_DEPARTMENT_OTHER): Payer: Self-pay

## 2018-04-02 ENCOUNTER — Ambulatory Visit (HOSPITAL_BASED_OUTPATIENT_CLINIC_OR_DEPARTMENT_OTHER)
Admission: RE | Admit: 2018-04-02 | Discharge: 2018-04-02 | Disposition: A | Discharge status: Home / Self Care | Payer: BLUE CROSS/BLUE SHIELD | Source: Ambulatory Visit | Attending: General Surgery | Admitting: General Surgery

## 2018-04-02 ENCOUNTER — Other Ambulatory Visit: Payer: Self-pay | Admitting: General Surgery

## 2018-04-02 ENCOUNTER — Ambulatory Visit (HOSPITAL_COMMUNITY)
Admission: RE | Admit: 2018-04-02 | Discharge: 2018-04-02 | Disposition: A | Discharge status: Home / Self Care | Payer: BLUE CROSS/BLUE SHIELD | Source: Ambulatory Visit | Attending: General Surgery | Admitting: General Surgery

## 2018-04-02 DIAGNOSIS — Z882 Allergy status to sulfonamides status: Secondary | ICD-10-CM | POA: Insufficient documentation

## 2018-04-02 DIAGNOSIS — Z881 Allergy status to other antibiotic agents status: Secondary | ICD-10-CM | POA: Diagnosis not present

## 2018-04-02 DIAGNOSIS — F419 Anxiety disorder, unspecified: Secondary | ICD-10-CM | POA: Diagnosis not present

## 2018-04-02 DIAGNOSIS — E039 Hypothyroidism, unspecified: Secondary | ICD-10-CM | POA: Insufficient documentation

## 2018-04-02 DIAGNOSIS — E785 Hyperlipidemia, unspecified: Secondary | ICD-10-CM | POA: Diagnosis not present

## 2018-04-02 DIAGNOSIS — Z888 Allergy status to other drugs, medicaments and biological substances status: Secondary | ICD-10-CM | POA: Insufficient documentation

## 2018-04-02 DIAGNOSIS — M19011 Primary osteoarthritis, right shoulder: Secondary | ICD-10-CM | POA: Insufficient documentation

## 2018-04-02 DIAGNOSIS — G8918 Other acute postprocedural pain: Secondary | ICD-10-CM | POA: Diagnosis not present

## 2018-04-02 DIAGNOSIS — C439 Malignant melanoma of skin, unspecified: Secondary | ICD-10-CM

## 2018-04-02 DIAGNOSIS — I739 Peripheral vascular disease, unspecified: Secondary | ICD-10-CM | POA: Insufficient documentation

## 2018-04-02 DIAGNOSIS — C773 Secondary and unspecified malignant neoplasm of axilla and upper limb lymph nodes: Secondary | ICD-10-CM | POA: Insufficient documentation

## 2018-04-02 DIAGNOSIS — D225 Melanocytic nevi of trunk: Secondary | ICD-10-CM | POA: Diagnosis not present

## 2018-04-02 DIAGNOSIS — C4359 Malignant melanoma of other part of trunk: Secondary | ICD-10-CM

## 2018-04-02 DIAGNOSIS — Z79899 Other long term (current) drug therapy: Secondary | ICD-10-CM | POA: Insufficient documentation

## 2018-04-02 DIAGNOSIS — Z885 Allergy status to narcotic agent status: Secondary | ICD-10-CM | POA: Diagnosis not present

## 2018-04-02 DIAGNOSIS — D181 Lymphangioma, any site: Secondary | ICD-10-CM | POA: Diagnosis not present

## 2018-04-02 HISTORY — DX: Unspecified injury of shoulder and upper arm, unspecified arm, initial encounter: S49.90XA

## 2018-04-02 HISTORY — DX: Other specified postprocedural states: Z98.890

## 2018-04-02 HISTORY — DX: Adverse effect of unspecified anesthetic, initial encounter: T41.45XA

## 2018-04-02 HISTORY — DX: Nausea with vomiting, unspecified: R11.2

## 2018-04-02 HISTORY — DX: Other complications of anesthesia, initial encounter: T88.59XA

## 2018-04-02 HISTORY — DX: Family history of other specified conditions: Z84.89

## 2018-04-02 HISTORY — PX: MELANOMA EXCISION WITH SENTINEL LYMPH NODE BIOPSY: SHX5267

## 2018-04-02 HISTORY — DX: Deviated nasal septum: J34.2

## 2018-04-02 HISTORY — DX: Hypothyroidism, unspecified: E03.9

## 2018-04-02 SURGERY — MELANOMA EXCISION WITH SENTINEL LYMPH NODE BIOPSY
Anesthesia: General | Site: Back | Laterality: Left

## 2018-04-02 MED ORDER — HYDROMORPHONE HCL 1 MG/ML IJ SOLN
INTRAMUSCULAR | Status: AC
  Filled 2018-04-02: qty 0.5

## 2018-04-02 MED ORDER — PHENYLEPHRINE 40 MCG/ML (10ML) SYRINGE FOR IV PUSH (FOR BLOOD PRESSURE SUPPORT)
PREFILLED_SYRINGE | INTRAVENOUS | Status: AC
  Filled 2018-04-02: qty 10

## 2018-04-02 MED ORDER — FENTANYL CITRATE (PF) 100 MCG/2ML IJ SOLN
INTRAMUSCULAR | Status: AC
  Filled 2018-04-02: qty 2

## 2018-04-02 MED ORDER — BUPIVACAINE HCL (PF) 0.5 % IJ SOLN
INTRAMUSCULAR | Status: AC
  Filled 2018-04-02: qty 30

## 2018-04-02 MED ORDER — CELECOXIB 200 MG PO CAPS
ORAL_CAPSULE | ORAL | Status: AC
  Filled 2018-04-02: qty 1

## 2018-04-02 MED ORDER — GLYCOPYRROLATE 0.2 MG/ML IJ SOLN
INTRAMUSCULAR | Status: DC | PRN
  Administered 2018-04-02: 0.2 mg via INTRAVENOUS

## 2018-04-02 MED ORDER — EPHEDRINE SULFATE 50 MG/ML IJ SOLN
INTRAMUSCULAR | Status: DC | PRN
  Administered 2018-04-02: 10 mg via INTRAVENOUS

## 2018-04-02 MED ORDER — DEXAMETHASONE SODIUM PHOSPHATE 10 MG/ML IJ SOLN
INTRAMUSCULAR | Status: AC
  Filled 2018-04-02: qty 1

## 2018-04-02 MED ORDER — ONDANSETRON HCL 4 MG/2ML IJ SOLN
INTRAMUSCULAR | Status: DC | PRN
  Administered 2018-04-02: 4 mg via INTRAVENOUS

## 2018-04-02 MED ORDER — EPHEDRINE 5 MG/ML INJ
INTRAVENOUS | Status: AC
  Filled 2018-04-02: qty 10

## 2018-04-02 MED ORDER — GABAPENTIN 300 MG PO CAPS
ORAL_CAPSULE | ORAL | Status: AC
  Filled 2018-04-02: qty 1

## 2018-04-02 MED ORDER — LIDOCAINE 2% (20 MG/ML) 5 ML SYRINGE
INTRAMUSCULAR | Status: AC
  Filled 2018-04-02: qty 5

## 2018-04-02 MED ORDER — CHLORHEXIDINE GLUCONATE CLOTH 2 % EX PADS: 6.0000 | MEDICATED_PAD | Freq: Once | CUTANEOUS | Status: DC

## 2018-04-02 MED ORDER — BUPIVACAINE HCL (PF) 0.5 % IJ SOLN
INTRAMUSCULAR | Status: DC | PRN
  Administered 2018-04-02 (×2): 5 mL
  Administered 2018-04-02: 10 mL

## 2018-04-02 MED ORDER — SCOPOLAMINE 1 MG/3DAYS TD PT72
MEDICATED_PATCH | TRANSDERMAL | Status: AC
  Filled 2018-04-02: qty 1

## 2018-04-02 MED ORDER — OXYCODONE HCL 5 MG/5ML PO SOLN: 5.0000 mg | Freq: Once | ORAL | Status: DC | PRN

## 2018-04-02 MED ORDER — MEPERIDINE HCL 25 MG/ML IJ SOLN: 6.2500 mg | INTRAMUSCULAR | Status: DC | PRN

## 2018-04-02 MED ORDER — PHENYLEPHRINE HCL 10 MG/ML IJ SOLN
INTRAMUSCULAR | Status: DC | PRN
  Administered 2018-04-02: 80 ug via INTRAVENOUS

## 2018-04-02 MED ORDER — OXYCODONE HCL 5 MG PO TABS: 5.0000 mg | ORAL_TABLET | Freq: Once | ORAL | Status: DC | PRN

## 2018-04-02 MED ORDER — CELECOXIB 200 MG PO CAPS
200.0000 mg | ORAL_CAPSULE | ORAL | Status: AC
  Administered 2018-04-02: 200 mg via ORAL

## 2018-04-02 MED ORDER — LACTATED RINGERS IV SOLN: INTRAVENOUS | Status: DC

## 2018-04-02 MED ORDER — SCOPOLAMINE 1 MG/3DAYS TD PT72
1.0000 | MEDICATED_PATCH | Freq: Once | TRANSDERMAL | Status: DC | PRN
  Administered 2018-04-02: 1.5 mg via TRANSDERMAL

## 2018-04-02 MED ORDER — BUPIVACAINE-EPINEPHRINE (PF) 0.25% -1:200000 IJ SOLN
INTRAMUSCULAR | Status: DC | PRN
  Administered 2018-04-02 (×2): 30 mL

## 2018-04-02 MED ORDER — PROMETHAZINE HCL 25 MG/ML IJ SOLN: 6.2500 mg | INTRAMUSCULAR | Status: DC | PRN

## 2018-04-02 MED ORDER — SUGAMMADEX SODIUM 200 MG/2ML IV SOLN
INTRAVENOUS | Status: DC | PRN
  Administered 2018-04-02: 200 mg via INTRAVENOUS

## 2018-04-02 MED ORDER — DEXAMETHASONE SODIUM PHOSPHATE 4 MG/ML IJ SOLN
INTRAMUSCULAR | Status: DC | PRN
  Administered 2018-04-02: 10 mg via INTRAVENOUS

## 2018-04-02 MED ORDER — LACTATED RINGERS IV SOLN
INTRAVENOUS | Status: DC
  Administered 2018-04-02 (×2): via INTRAVENOUS

## 2018-04-02 MED ORDER — PROPOFOL 10 MG/ML IV BOLUS
INTRAVENOUS | Status: AC
  Filled 2018-04-02: qty 20

## 2018-04-02 MED ORDER — MIDAZOLAM HCL 2 MG/2ML IJ SOLN
INTRAMUSCULAR | Status: AC
  Filled 2018-04-02: qty 2

## 2018-04-02 MED ORDER — PROPOFOL 10 MG/ML IV BOLUS
INTRAVENOUS | Status: DC | PRN
  Administered 2018-04-02: 20 mg via INTRAVENOUS
  Administered 2018-04-02: 180 mg via INTRAVENOUS

## 2018-04-02 MED ORDER — METHYLENE BLUE 0.5 % INJ SOLN
INTRAVENOUS | Status: DC | PRN
  Administered 2018-04-02: 4 mL via INTRADERMAL

## 2018-04-02 MED ORDER — CEFAZOLIN SODIUM-DEXTROSE 2-4 GM/100ML-% IV SOLN
INTRAVENOUS | Status: AC
  Filled 2018-04-02: qty 100

## 2018-04-02 MED ORDER — CEFAZOLIN SODIUM-DEXTROSE 2-4 GM/100ML-% IV SOLN
2.0000 g | INTRAVENOUS | Status: AC
  Administered 2018-04-02: 2 g via INTRAVENOUS

## 2018-04-02 MED ORDER — TRAMADOL HCL 50 MG PO TABS: 50.0000 mg | ORAL_TABLET | Freq: Four times a day (QID) | ORAL | 1 refills | Status: DC | PRN

## 2018-04-02 MED ORDER — ACETAMINOPHEN 500 MG PO TABS
1000.0000 mg | ORAL_TABLET | ORAL | Status: AC
  Administered 2018-04-02: 1000 mg via ORAL

## 2018-04-02 MED ORDER — FENTANYL CITRATE (PF) 100 MCG/2ML IJ SOLN
50.0000 ug | INTRAMUSCULAR | Status: AC | PRN
  Administered 2018-04-02: 50 ug via INTRAVENOUS
  Administered 2018-04-02: 100 ug via INTRAVENOUS
  Administered 2018-04-02 (×3): 50 ug via INTRAVENOUS

## 2018-04-02 MED ORDER — ACETAMINOPHEN 500 MG PO TABS
ORAL_TABLET | ORAL | Status: AC
  Filled 2018-04-02: qty 2

## 2018-04-02 MED ORDER — MIDAZOLAM HCL 2 MG/2ML IJ SOLN
1.0000 mg | INTRAMUSCULAR | Status: DC | PRN
  Administered 2018-04-02: 2 mg via INTRAVENOUS

## 2018-04-02 MED ORDER — ROCURONIUM BROMIDE 100 MG/10ML IV SOLN
INTRAVENOUS | Status: DC | PRN
  Administered 2018-04-02: 50 mg via INTRAVENOUS

## 2018-04-02 MED ORDER — METHYLENE BLUE 0.5 % INJ SOLN
INTRAVENOUS | Status: AC
  Filled 2018-04-02: qty 10

## 2018-04-02 MED ORDER — ONDANSETRON HCL 4 MG/2ML IJ SOLN
INTRAMUSCULAR | Status: AC
  Filled 2018-04-02: qty 4

## 2018-04-02 MED ORDER — HYDROMORPHONE HCL 1 MG/ML IJ SOLN
0.2500 mg | INTRAMUSCULAR | Status: DC | PRN
  Administered 2018-04-02 (×2): 0.5 mg via INTRAVENOUS

## 2018-04-02 MED ORDER — TECHNETIUM TC 99M SULFUR COLLOID FILTERED
0.5000 | Freq: Once | INTRAVENOUS | Status: AC | PRN
  Administered 2018-04-02: 0.5 via INTRADERMAL

## 2018-04-02 MED ORDER — ONDANSETRON HCL 4 MG/2ML IJ SOLN
INTRAMUSCULAR | Status: AC
  Filled 2018-04-02: qty 2

## 2018-04-02 MED ORDER — GABAPENTIN 300 MG PO CAPS
300.0000 mg | ORAL_CAPSULE | ORAL | Status: AC
  Administered 2018-04-02: 300 mg via ORAL

## 2018-04-02 SURGICAL SUPPLY — 71 items
ADH SKN CLS APL DERMABOND .7 (GAUZE/BANDAGES/DRESSINGS) ×2
APL SKNCLS STERI-STRIP NONHPOA (GAUZE/BANDAGES/DRESSINGS) ×1
APPLIER CLIP 11 MED OPEN (CLIP) ×3
APR CLP MED 11 20 MLT OPN (CLIP) ×1
BENZOIN TINCTURE PRP APPL 2/3 (GAUZE/BANDAGES/DRESSINGS) ×3 IMPLANT
BLADE HEX COATED 2.75 (ELECTRODE) ×3 IMPLANT
BLADE SURG 10 STRL SS (BLADE) IMPLANT
BLADE SURG 15 STRL LF DISP TIS (BLADE) ×2 IMPLANT
BLADE SURG 15 STRL SS (BLADE) ×6
CANISTER SUCT 1200ML W/VALVE (MISCELLANEOUS) IMPLANT
CHLORAPREP W/TINT 26ML (MISCELLANEOUS) ×7 IMPLANT
CLIP APPLIE 11 MED OPEN (CLIP) IMPLANT
CLOSURE WOUND 1/2 X4 (GAUZE/BANDAGES/DRESSINGS) ×1
COVER BACK TABLE 60X90IN (DRAPES) ×3 IMPLANT
COVER MAYO STAND STRL (DRAPES) ×5 IMPLANT
COVER PROBE W GEL 5X96 (DRAPES) ×3 IMPLANT
COVER SURGICAL LIGHT HANDLE (MISCELLANEOUS) ×2 IMPLANT
DECANTER SPIKE VIAL GLASS SM (MISCELLANEOUS) IMPLANT
DERMABOND ADVANCED (GAUZE/BANDAGES/DRESSINGS) ×4
DERMABOND ADVANCED .7 DNX12 (GAUZE/BANDAGES/DRESSINGS) IMPLANT
DRAPE LAPAROSCOPIC ABDOMINAL (DRAPES) ×3 IMPLANT
DRAPE LAPAROTOMY 100X72 PEDS (DRAPES) ×2 IMPLANT
DRAPE SURG 17X23 STRL (DRAPES) ×2 IMPLANT
DRAPE U-SHAPE 76X120 STRL (DRAPES) IMPLANT
DRAPE UTILITY XL STRL (DRAPES) ×7 IMPLANT
DRSG PAD ABDOMINAL 8X10 ST (GAUZE/BANDAGES/DRESSINGS) IMPLANT
ELECT REM PT RETURN 9FT ADLT (ELECTROSURGICAL) ×3
ELECTRODE REM PT RTRN 9FT ADLT (ELECTROSURGICAL) ×1 IMPLANT
GAUZE 4X4 16PLY RFD (DISPOSABLE) ×2 IMPLANT
GLOVE BIOGEL PI IND STRL 7.0 (GLOVE) IMPLANT
GLOVE BIOGEL PI INDICATOR 7.0 (GLOVE) ×8
GLOVE ECLIPSE 6.5 STRL STRAW (GLOVE) ×4 IMPLANT
GLOVE EUDERMIC 7 POWDERFREE (GLOVE) ×3 IMPLANT
GOWN STRL REUS W/ TWL LRG LVL3 (GOWN DISPOSABLE) ×1 IMPLANT
GOWN STRL REUS W/ TWL XL LVL3 (GOWN DISPOSABLE) ×1 IMPLANT
GOWN STRL REUS W/TWL LRG LVL3 (GOWN DISPOSABLE) ×3
GOWN STRL REUS W/TWL XL LVL3 (GOWN DISPOSABLE) ×6
NDL HYPO 25X1 1.5 SAFETY (NEEDLE) ×2 IMPLANT
NDL SAFETY ECLIPSE 18X1.5 (NEEDLE) ×1 IMPLANT
NEEDLE HYPO 18GX1.5 SHARP (NEEDLE)
NEEDLE HYPO 25X1 1.5 SAFETY (NEEDLE) ×6 IMPLANT
NS IRRIG 1000ML POUR BTL (IV SOLUTION) ×3 IMPLANT
PACK BASIN DAY SURGERY FS (CUSTOM PROCEDURE TRAY) ×3 IMPLANT
PAD ALCOHOL SWAB (MISCELLANEOUS) ×9 IMPLANT
PENCIL BUTTON HOLSTER BLD 10FT (ELECTRODE) ×5 IMPLANT
SHEET MEDIUM DRAPE 40X70 STRL (DRAPES) ×5 IMPLANT
SLEEVE SCD COMPRESS KNEE MED (MISCELLANEOUS) ×3 IMPLANT
SPONGE LAP 4X18 RFD (DISPOSABLE) ×5 IMPLANT
STAPLER VISISTAT 35W (STAPLE) ×1 IMPLANT
STRIP CLOSURE SKIN 1/2X4 (GAUZE/BANDAGES/DRESSINGS) ×2 IMPLANT
SUT ETHILON 2 0 FS 18 (SUTURE) ×6 IMPLANT
SUT ETHILON 3 0 PS 1 (SUTURE) ×4 IMPLANT
SUT MNCRL AB 3-0 PS2 18 (SUTURE) ×2 IMPLANT
SUT MNCRL AB 4-0 PS2 18 (SUTURE) ×4 IMPLANT
SUT SILK 2 0 SH (SUTURE) ×2 IMPLANT
SUT VIC AB 2-0 CT1 27 (SUTURE) ×6
SUT VIC AB 2-0 CT1 TAPERPNT 27 (SUTURE) IMPLANT
SUT VIC AB 3-0 54X BRD REEL (SUTURE) IMPLANT
SUT VIC AB 3-0 BRD 54 (SUTURE) ×3
SUT VIC AB 3-0 SH 27 (SUTURE)
SUT VIC AB 3-0 SH 27X BRD (SUTURE) IMPLANT
SUT VICRYL 3-0 CR8 SH (SUTURE) ×4 IMPLANT
SYR 10ML LL (SYRINGE) ×3 IMPLANT
SYR BULB 3OZ (MISCELLANEOUS) ×5 IMPLANT
SYR TB 1ML LL NO SAFETY (SYRINGE) ×3 IMPLANT
TOWEL GREEN STERILE FF (TOWEL DISPOSABLE) ×6 IMPLANT
TOWEL OR NON WOVEN STRL DISP B (DISPOSABLE) ×3 IMPLANT
TRAY DSU PREP LF (CUSTOM PROCEDURE TRAY) ×1 IMPLANT
TUBE CONNECTING 20'X1/4 (TUBING) ×2
TUBE CONNECTING 20X1/4 (TUBING) ×2 IMPLANT
YANKAUER SUCT BULB TIP NO VENT (SUCTIONS) ×4 IMPLANT

## 2018-04-02 NOTE — Interval H&P Note (Signed)
History and Physical Interval Note:  04/02/2018 12:58 PM  Shannon Quinn  has presented today for surgery, with the diagnosis of MALIGNANT MELANOMA OF UPPER BACK  The various methods of treatment have been discussed with the patient and family. After consideration of risks, benefits and other options for treatment, the patient has consented to  Procedure(s): INJECT BLUE DYE MELANOMA LEFT BACK WITH  WIDE LOCAL EXCISION MELANOMA LEFT BACK BILATERAL AXILLARY DEEP SENTINEL LYMPH NODE BIOPSY (Left) as a surgical intervention .  The patient's history has been reviewed, patient examined, no change in status, stable for surgery.  I have reviewed the patient's chart and labs.  Questions were answered to the patient's satisfaction.     Adin Hector

## 2018-04-02 NOTE — Discharge Instructions (Signed)
Change bandage on back as necessary.  Keep it clean and dry  The axillary incisions are painted with a clear plastic superglue called Dermabond This will flake off in about 3 weeks  You may take showers, starting on Saturday No tub baths  There are sutures in the incision on your back We will need to remove those in the office in 2 weeks be sure to set up an appointment  Move your shoulders around frequently to keep them from getting stiff  You may drive your car in about 1 week  **No Tylenol or Ibuprofen until after 5:15pm  Post Anesthesia Home Care Instructions  Activity: Get plenty of rest for the remainder of the day. A responsible individual must stay with you for 24 hours following the procedure.  For the next 24 hours, DO NOT: -Drive a car -Paediatric nurse -Drink alcoholic beverages -Take any medication unless instructed by your physician -Make any legal decisions or sign important papers.  Meals: Start with liquid foods such as gelatin or soup. Progress to regular foods as tolerated. Avoid greasy, spicy, heavy foods. If nausea and/or vomiting occur, drink only clear liquids until the nausea and/or vomiting subsides. Call your physician if vomiting continues.  Special Instructions/Symptoms: Your throat may feel dry or sore from the anesthesia or the breathing tube placed in your throat during surgery. If this causes discomfort, gargle with warm salt water. The discomfort should disappear within 24 hours.  If you had a scopolamine patch placed behind your ear for the management of post- operative nausea and/or vomiting:  1. The medication in the patch is effective for 72 hours, after which it should be removed.  Wrap patch in a tissue and discard in the trash. Wash hands thoroughly with soap and water. 2. You may remove the patch earlier than 72 hours if you experience unpleasant side effects which may include dry mouth, dizziness or visual disturbances. 3. Avoid  touching the patch. Wash your hands with soap and water after contact with the patch.

## 2018-04-02 NOTE — Op Note (Addendum)
Patient Name:           Shannon Quinn   Date of Surgery:        04/02/2018  Pre op Diagnosis:      Malignant melanoma, left upper back  Post op Diagnosis:    Same  Procedure:                 Inject blue dye left upper back                                      Wide local excision malignant melanoma left upper back                                      Right axillary deep sentinel lymph node biopsy                                       Left axillary deep sentinel lymph node biopsy  Surgeon:                     Edsel Petrin. Dalbert Batman, M.D., FACS  Assistant:                      OR staff  Operative Indications:     This is a pleasant 47 year old woman. She is brought to the operating room for definitive surgical management of her melanoma of the left upper back. Her PCP is Forbes Cellar, D.O. She was referred by Hyman Bower, PA at the skin surgery Center.       Recent shave biopsy of pigmented nevus of the left upper paraspinal area on February 26, 2018 showed malignant melanoma, Breslow thickness 1.1 mm. Peripheral margins involved. Deep margins not involved. No adverse features. Initial staging T2a, NX. She has undergone lymphoscintigram and she maps to both axillae.      Comorbidities include prior nevi. BMI 38. Hysterectomy. Some arthritis of right shoulder. Family history negative for cancer.       I advised her to undergo injection of blue dye, wide local excision with a transverse elliptical incision at least 1 cm margin, and bilateral axillary sentinel lymph node mapping and biopsy. I discussed the indications, details, techniques, numerous risk of the surgery with him.. They are  Aware of the risk of shoulder disability, arm numbness, arm swelling, infection and bleeding, and other unforeseen problems. They are  aware that if there is positive nodes that we would need to involve a medical oncologist. We talked about long-term surveillance with dermatology every 6 months and melanoma  surgeon every year indefinitely. They understand all of these issues. All the questions were answered. They agree with this plan.       I recommended ophthalmologic and retinal exam and gave her some names to seek for adequate exam.  Operative Findings:       The melanoma on the back was excised with a transverse elliptical incision.  1.2 cm margin superiorly and inferiorly and greater than 2 cm medial and lateral.  The dissection was carried down to the fascia.  I found 3 clear-cut sentinel lymph nodes on the right and one clear-cut sentinel lymph node on the left and sent some other axillary contents.  The wound dimensions were:   6 cm. Transverse                                                        3.5 cm. Vertical                                                        3.5 cm deep                                                        Layered closure  Procedure in Detail:          The patient underwent bilateral pectoral blocks.  Technetium radionuclide was injected into the melanoma area by the nuclear medicine technician.  The patient was taken the operating room and underwent general endotracheal anesthesia.  The patient was positioned prone.  Following alcohol prep I injected 5 cc of methylene blue into the intradermal area around the melanoma and massaged the wound for a few minutes.  The upper back was then prepped and draped in a sterile fashion.  0.5% Marcaine was used as local infiltration anesthetic.     I used a ruler and marked a 1.2 cm margin superiorly and inferiorly.  I then planned a transverse elliptical incision.  The incision was made with a knife.  The dissection was carried down to the muscle fascia with the cautery.  The specimen was removed.  The superior and lateral margins were marked with sutures.  Specimen was sent to pathology.  After irrigating the wound hemostasis was excellent.  The deeper subcutaneous tissues were closed with 2-0 Vicryl sutures.  The superficial  subcutaneous tissues were closed with 3-0 Vicryl.  The skin was closed with interrupted 3-0 and 2-0 nylon sutures.  Clean bandage was was placed.     The patient was then repositioned supine with her arms out at her sides.  The chest wall and axilla were prepped and draped in a sterile fashion.  Neoprobe was used to guide sentinel node mapping and biopsy on both sides.  Right axillary incision was made.  Dissection was carried down to the clavipectoral fascia which was incised.  I dissected out 3 sentinel lymph nodes that were blue and hot and that was all I found.  I then went to the left side and made a similar axillary incision.  Once I entered the clavipectoral fascia I found one sentinel lymph node that was all there was.  On each side a little bit of extra tissue was sent that came out with the dissection.  Hemostasis was excellent and achieved with electrocautery and metal clips.  The wounds were irrigated.  The clavipectoral fascia was closed with 3-0 Vicryl sutures.  The superficial subcutaneous tissue was closed with 3-0 Vicryl sutures.  On each side I closed the incisions with running subcuticular 4-0 Monocryl and Dermabond.  Patient tolerated the procedure well and was taken to PACU in stable condition.  EBL 40 cc.  Counts correct.  Complications none.   Addendum: I logged known to the [employer] and reviewed her prescription medication history     [name] Dalbert Batman, M.D., FACS General and Minimally Invasive Surgery Breast and Colorectal Surgery  04/02/2018 3:41 PM

## 2018-04-02 NOTE — Progress Notes (Signed)
Assisted Dr. Smith Robert with right, left, ultrasound guided, pectoralis blocks. Side rails up, monitors on throughout procedure. See vital signs in flow sheet. Tolerated Procedure well.

## 2018-04-02 NOTE — Anesthesia Procedure Notes (Signed)
Anesthesia Regional Block: Pectoralis block   Pre-Anesthetic Checklist: ,, timeout performed, Correct Patient, Correct Site, Correct Laterality, Correct Procedure, Correct Position, site marked, Risks and benefits discussed,  Surgical consent,  Pre-op evaluation,  At surgeon's request and post-op pain management  Laterality: Right  Prep: chloraprep       Needles:  Injection technique: Single-shot  Needle Type: Echogenic Needle     Needle Length: 9cm  Needle Gauge: 21     Additional Needles:   Procedures:,,,, ultrasound used (permanent image in chart),,,,  Narrative:  Start time: 04/02/2018 11:29 AM End time: 04/02/2018 11:35 AM Injection made incrementally with aspirations every 5 mL.  Performed by: Personally  Anesthesiologist: Effie Berkshire, MD  Additional Notes: Patient tolerated the procedure well. Local anesthetic introduced in an incremental fashion under minimal resistance after negative aspirations. No paresthesias were elicited. After completion of the procedure, no acute issues were identified and patient continued to be monitored by RN.

## 2018-04-02 NOTE — Transfer of Care (Signed)
Immediate Anesthesia Transfer of Care Note  Patient: Shannon Quinn  Procedure(s) Performed: Medical sales representative DYE MELANOMA LEFT BACK WITH  WIDE LOCAL EXCISION MELANOMA LEFT BACK BILATERAL AXILLARY DEEP SENTINEL LYMPH NODE BIOPSY (Left Back)  Patient Location: PACU  Anesthesia Type:GA combined with regional for post-op pain  Level of Consciousness: sedated  Airway & Oxygen Therapy: Patient Spontanous Breathing and Patient connected to face mask oxygen  Post-op Assessment: Report given to RN and Post -op Vital signs reviewed and stable  Post vital signs: Reviewed and stable  Last Vitals:  Vitals Value Taken Time  BP 125/82 04/02/2018  3:52 PM  Temp    Pulse 86 04/02/2018  3:56 PM  Resp 14 04/02/2018  3:56 PM  SpO2 99 % 04/02/2018  3:56 PM  Vitals shown include unvalidated device data.  Last Pain:  Vitals:   04/02/18 1042  TempSrc: Oral  PainSc: 0-No pain         Complications: No apparent anesthesia complications

## 2018-04-02 NOTE — Anesthesia Preprocedure Evaluation (Addendum)
Anesthesia Evaluation  Patient identified by MRN, date of birth, ID band Patient awake    Reviewed: Allergy & Precautions, NPO status , Patient's Chart, lab work & pertinent test results  History of Anesthesia Complications (+) PONV  Airway Mallampati: II  TM Distance: >3 FB Neck ROM: Full    Dental  (+) Teeth Intact, Dental Advisory Given   Pulmonary    breath sounds clear to auscultation       Cardiovascular + Peripheral Vascular Disease   Rhythm:Regular Rate:Normal     Neuro/Psych Anxiety  Neuromuscular disease    GI/Hepatic negative GI ROS, Neg liver ROS,   Endo/Other  Hypothyroidism   Renal/GU      Musculoskeletal negative musculoskeletal ROS (+)   Abdominal Normal abdominal exam  (+)   Peds  Hematology negative hematology ROS (+)   Anesthesia Other Findings   Reproductive/Obstetrics                           Anesthesia Physical Anesthesia Plan  ASA: II  Anesthesia Plan: General   Post-op Pain Management: GA combined w/ Regional for post-op pain   Induction:   PONV Risk Score and Plan: 4 or greater and Ondansetron, Dexamethasone, Midazolam and Scopolamine patch - Pre-op  Airway Management Planned: Oral ETT  Additional Equipment: None  Intra-op Plan:   Post-operative Plan: Extubation in OR  Informed Consent: I have reviewed the patients History and Physical, chart, labs and discussed the procedure including the risks, benefits and alternatives for the proposed anesthesia with the patient or authorized representative who has indicated his/her understanding and acceptance.   Dental advisory given  Plan Discussed with: CRNA  Anesthesia Plan Comments:        Anesthesia Quick Evaluation

## 2018-04-02 NOTE — Anesthesia Procedure Notes (Signed)
Anesthesia Regional Block: Pectoralis block   Pre-Anesthetic Checklist: ,, timeout performed, Correct Patient, Correct Site, Correct Laterality, Correct Procedure, Correct Position, site marked, Risks and benefits discussed,  Surgical consent,  Pre-op evaluation,  At surgeon's request and post-op pain management  Laterality: Left  Prep: chloraprep       Needles:  Injection technique: Single-shot  Needle Type: Echogenic Needle     Needle Length: 9cm  Needle Gauge: 21     Additional Needles:   Procedures:,,,, ultrasound used (permanent image in chart),,,,  Narrative:  Start time: 04/02/2018 11:23 AM End time: 04/02/2018 11:28 AM Injection made incrementally with aspirations every 5 mL.  Performed by: Personally  Anesthesiologist: Effie Berkshire, MD  Additional Notes: Patient tolerated the procedure well. Local anesthetic introduced in an incremental fashion under minimal resistance after negative aspirations. No paresthesias were elicited. After completion of the procedure, no acute issues were identified and patient continued to be monitored by RN.

## 2018-04-02 NOTE — Anesthesia Procedure Notes (Signed)
Procedure Name: Intubation Date/Time: 04/02/2018 1:24 PM Performed by: Maryella Shivers, CRNA Pre-anesthesia Checklist: Patient identified, Emergency Drugs available, Suction available and Patient being monitored Patient Re-evaluated:Patient Re-evaluated prior to induction Oxygen Delivery Method: Circle system utilized Preoxygenation: Pre-oxygenation with 100% oxygen Induction Type: IV induction Ventilation: Mask ventilation without difficulty Laryngoscope Size: Mac and 3 Grade View: Grade I Tube type: Oral Tube size: 7.0 mm Number of attempts: 1 Airway Equipment and Method: Stylet and Oral airway Placement Confirmation: ETT inserted through vocal cords under direct vision,  positive ETCO2 and breath sounds checked- equal and bilateral Secured at: 20 cm Tube secured with: Tape Dental Injury: Teeth and Oropharynx as per pre-operative assessment

## 2018-04-03 ENCOUNTER — Encounter (HOSPITAL_BASED_OUTPATIENT_CLINIC_OR_DEPARTMENT_OTHER): Payer: Self-pay | Admitting: General Surgery

## 2018-04-03 NOTE — Anesthesia Postprocedure Evaluation (Signed)
Anesthesia Post Note  Patient: [name]  Procedure(s) Performed: Medical sales representative DYE MELANOMA LEFT BACK WITH  WIDE LOCAL EXCISION MELANOMA LEFT BACK BILATERAL AXILLARY DEEP SENTINEL LYMPH NODE BIOPSY (Left Back)     Patient location during evaluation: PACU Anesthesia Type: General Level of consciousness: awake and alert, oriented and awake Pain management: pain level controlled Vital Signs Assessment: post-procedure vital signs reviewed and stable Respiratory status: spontaneous breathing, nonlabored ventilation and respiratory function stable Cardiovascular status: blood pressure returned to baseline and stable Postop Assessment: no apparent nausea or vomiting Anesthetic complications: no    Last Vitals:  Vitals:   04/02/18 1715 04/02/18 1800  BP: 121/60 131/70  Pulse: 82 67  Resp: (!) 21 18  Temp:  36.7 C  SpO2: 99% 100%    Last Pain:  Vitals:   04/02/18 1800  TempSrc:   PainSc: 5                  Catalina Gravel

## 2018-04-06 ENCOUNTER — Telehealth: Payer: Self-pay | Admitting: Family Medicine

## 2018-04-06 NOTE — Telephone Encounter (Signed)
Melanie from pathology was calling to see if we had previous records from patients dermatologist. We do not have anything listed referred her to speak with Dr Audie Pinto office at [facility] surgery since they did recent procedure. Patient was not the caller it was pathology calling for information.

## 2018-04-06 NOTE — Telephone Encounter (Signed)
Copied from [place] [id number]. Topic: Quick Communication - See Telephone Encounter >> Apr 06, 2018  8:58 AM Synthia Innocent wrote: CRM for notification. See Telephone encounter for: 04/06/18. Requesting to speak with Dr Raoul Pitch, need to speak with provider regarding prior path report of left upper back melanoma. Results are not in system. Please advise Needing to speak with someone asap

## 2018-04-06 NOTE — Telephone Encounter (Signed)
Please call pt and obtain more information on what she is asking for.  - We did not treat her for skin lesions.  - I dont have record of any path reports for skin lesions, even by other providers.  - I recommend she call the provider that performed her surgeries if she has questions.

## 2018-04-08 ENCOUNTER — Telehealth: Payer: Self-pay | Admitting: Oncology

## 2018-04-08 NOTE — Telephone Encounter (Signed)
New patient referral received from Dr. Dalbert Batman for melanoma. Pt has been scheduled for the pt to see Dr. Alen Blew on 9/11 at 2pm. Lft the pt a vm to call me back.

## 2018-04-09 ENCOUNTER — Encounter: Payer: Self-pay | Admitting: Oncology

## 2018-04-09 ENCOUNTER — Telehealth: Payer: Self-pay | Admitting: Oncology

## 2018-04-09 NOTE — Telephone Encounter (Signed)
Pt confirmed appt w/Dr. Alen Blew on 9/11 at 2pm. Pt aware to arrive 30 minutes early. Leter mailed.

## 2018-04-20 DIAGNOSIS — C4359 Malignant melanoma of other part of trunk: Secondary | ICD-10-CM | POA: Diagnosis not present

## 2018-04-20 DIAGNOSIS — D2261 Melanocytic nevi of right upper limb, including shoulder: Secondary | ICD-10-CM | POA: Diagnosis not present

## 2018-04-22 ENCOUNTER — Inpatient Hospital Stay: Payer: BLUE CROSS/BLUE SHIELD | Attending: Oncology | Admitting: Oncology

## 2018-04-22 ENCOUNTER — Telehealth: Payer: Self-pay | Admitting: Oncology

## 2018-04-22 VITALS — BP 153/84 | HR 63 | Temp 98.1°F | Resp 17 | Ht 64.54 in | Wt 221.1 lb

## 2018-04-22 DIAGNOSIS — C4359 Malignant melanoma of other part of trunk: Secondary | ICD-10-CM

## 2018-04-22 DIAGNOSIS — C439 Malignant melanoma of skin, unspecified: Secondary | ICD-10-CM

## 2018-04-22 NOTE — Progress Notes (Signed)
Reason for the request:  Melanoma  HPI: I was asked by Dr. Dalbert Batman  to evaluate Shannon Quinn for recent diagnosis of melanoma.  She is a 47 year old woman with benign nevi that followed with a dermatology for an extended period of time.  She developed a pigmented lesion on the left aspect of her upper back in the last year.  She was evaluated by dermatology and a shave biopsy obtained at the time showed a superficial spreading melanoma measuring 1.8 mm to the deep margin without any lymphovascular invasion alteration.  The pathological staging was T2 a with positive margins.  She subsequently was evaluated by Dr. Dalbert Batman and underwent vision and bilateral sentinel lymph node biopsy of the axilla.  The final pathology obtained on April 02, 2018 showed a residual melanoma of 0.8 mm T staging was T2a.  She had bilateral lymph node sentinel lymph node biopsy with 1 out of 10 lymph nodes were positive for melanoma.  Less than 0.1 mm tiny focus of melanoma seen on 1 of those 10 lymph nodes.  Clinically she is recovering slowly from her operation and has developed skin cellulitis and has been treated with oral antibiotics as well as Diflucan.  She still has some irritation and recovering slowly.  She denies any constitutional symptoms and overall feeling well.  She does not report any headaches, blurry vision, syncope or seizures. Does not report any fevers, chills or sweats.  Does not report any cough, wheezing or hemoptysis.  Does not report any chest pain, palpitation, orthopnea or leg edema.  Does not report any nausea, vomiting or abdominal pain.  Does not report any constipation or diarrhea.  Does not report any skeletal complaints.    Does not report frequency, urgency or hematuria.  Does not report any skin rashes or lesions. Does not report any heat or cold intolerance.  Does not report any lymphadenopathy or petechiae.  Does not report any anxiety or depression.  Remaining review of systems is negative.     Past Medical History:  Diagnosis Date  . Colon polyps 2011   3 polpys removed  . Complication of anesthesia    "severe Reaction" "Physically ill"   . Deviated septum   . Family history of adverse reaction to anesthesia    violent when waking up from anesthesia - very sick  . Hypoglycemic syndrome 1996   during pregnancy  . Hypothyroidism   . Melanoma ([place])    03/10/2018  . PONV (postoperative nausea and vomiting)   . Shoulder injury    right - side happened from last surgery  . Thyroid disease   . Varicose vein of leg   :  Past Surgical History:  Procedure Laterality Date  . COLONOSCOPY     polyps  . LAPAROSCOPIC TOTAL HYSTERECTOMY  09/04/2017   Dr. Gaetano Net for endometriosis  . MELANOMA EXCISION WITH SENTINEL LYMPH NODE BIOPSY Left 04/02/2018   Procedure: INJECT BLUE DYE MELANOMA LEFT BACK WITH  WIDE LOCAL EXCISION MELANOMA LEFT BACK BILATERAL AXILLARY DEEP SENTINEL LYMPH NODE BIOPSY;  Surgeon: Fanny Skates, MD;  Location: [facility];  Service: General;  Laterality: Left;  . TUBAL LIGATION    . WISDOM TOOTH EXTRACTION    :   Current Outpatient Medications:  .  aspirin-acetaminophen-caffeine (EXCEDRIN MIGRAINE) 250-250-65 MG tablet, Take 2 tablets by mouth every 6 (six) hours as needed for headache., Disp: , Rfl:  .  doxycycline (VIBRAMYCIN) 100 MG capsule, , Disp: , Rfl:  .  estradiol (VIVELLE-DOT)  0.1 MG/24HR patch, Place 0.1 mg onto the skin 2 (two) times a week., Disp: , Rfl: 12 .  fluconazole (DIFLUCAN) 150 MG tablet, Take 150 mg by mouth 2 (two) times daily., Disp: , Rfl: 0 .  ibuprofen (ADVIL,MOTRIN) 200 MG tablet, Take 600-800 mg by mouth every 6 (six) hours as needed for headache or moderate pain., Disp: , Rfl:  .  ketoconazole (NIZORAL) 2 % cream, 1 APPLICATION APPLY ON THE SKIN TWICE A DAY UNTIL 1 WEEK POST CLEAR, Disp: , Rfl: 2 .  traMADol (ULTRAM) 50 MG tablet, Take 1-2 tablets (50-100 mg total) by mouth every 6 (six) hours as needed.  (Patient not taking: Reported on 04/22/2018), Disp: 30 tablet, Rfl: 1:  Allergies  Allergen Reactions  . Neomycin     UNSPECIFIED REACTION   . Sulfonamide Derivatives     UNSPECIFIED REACTION   . Augmentin [Amoxicillin-Pot Clavulanate] Nausea And Vomiting    amox ok.   . Hydrocodone Nausea And Vomiting    Severe vomiting  . Oxycodone Nausea And Vomiting    Severe vomiting  . Prednisone Other (See Comments)    insomnia  :  Family History  Problem Relation Age of Onset  . Arthritis Mother   . Alcohol abuse Father   . Arthritis Father   . Hyperlipidemia Father   . Stroke Father        2012  . Depression Father   . Diabetes Father   . Heart disease Father 36  . Diabetes Sister   . Dementia Maternal Grandmother   . Heart disease Maternal Grandfather 42  . Diabetes Paternal Grandmother   . COPD Paternal Grandfather        emphysema  . Cancer Paternal Uncle        breast cancer  :  Social History   Socioeconomic History  . Marital status: Married    Spouse name: Not on file  . Number of children: 2  . Years of education: Not on file  . Highest education level: Not on file  Occupational History  . Not on file  Social Needs  . Financial resource strain: Not on file  . Food insecurity:    Worry: Not on file    Inability: Not on file  . Transportation needs:    Medical: Not on file    Non-medical: Not on file  Tobacco Use  . Smoking status: Never Smoker  . Smokeless tobacco: Never Used  Substance and Sexual Activity  . Alcohol use: Yes    Alcohol/week: 0.0 standard drinks    Comment: occasional  . Drug use: No  . Sexual activity: Yes    Partners: Male    Birth control/protection: Other-see comments    Comment: pt had a tubal ligation in 2000  Lifestyle  . Physical activity:    Days per week: Not on file    Minutes per session: Not on file  . Stress: Not on file  Relationships  . Social connections:    Talks on phone: Not on file    Gets together: Not  on file    Attends religious service: Not on file    Active member of club or organization: Not on file    Attends meetings of clubs or organizations: Not on file    Relationship status: Not on file  . Intimate partner violence:    Fear of current or ex partner: Not on file    Emotionally abused: Not on file    Physically abused:  Not on file    Forced sexual activity: Not on file  Other Topics Concern  . Not on file  Social History Narrative   Regular exercise: walking 1 mile per day   Caffeine use: soda drinker   Lives with husband and 2 children in a one story home.     Works for [employer].  She works in Science writer   Education: associates degree.    :  Pertinent items are noted in HPI.  Exam: Blood pressure (!) 153/84, pulse 63, temperature 98.1 F (36.7 C), temperature source Oral, resp. rate 17, height 5' 4.54" (1.639 m), weight 221 lb 1.6 oz (100.3 kg), last menstrual period 08/23/2017, SpO2 100 %.   ECOG 0 General appearance: alert and cooperative appeared without distress. Head: atraumatic without any abnormalities. Eyes: conjunctivae/corneas clear. PERRL.  Sclera anicteric. Throat: lips, mucosa, and tongue normal; without oral thrush or ulcers. Resp: clear to auscultation bilaterally without rhonchi, wheezes or dullness to percussion. Cardio: regular rate and rhythm, S1, S2 normal, no murmur, click, rub or gallop GI: soft, non-tender; bowel sounds normal; no masses,  no organomegaly Skin: Noted on her axilla bilaterally.  Well-healed scar noted on her back as well. Lymph nodes: Cervical, supraclavicular, and axillary nodes normal. Neurologic: Grossly normal without any motor, sensory or deep tendon reflexes. Musculoskeletal: No joint deformity or effusion.  CBC    Component Value Date/Time   WBC 7.5 11/03/2017 1432   RBC 4.64 11/03/2017 1432   HGB 13.7 11/03/2017 1432   HCT 40.8 11/03/2017 1432   PLT 325.0 11/03/2017 1432    MCV 88.1 11/03/2017 1432   MCH 29.3 05/25/2012 1500   MCHC 33.4 11/03/2017 1432   RDW 14.2 11/03/2017 1432   LYMPHSABS 2.6 11/03/2017 1432   MONOABS 0.4 11/03/2017 1432   EOSABS 0.3 11/03/2017 1432   BASOSABS 0.1 11/03/2017 1432     Chemistry      Component Value Date/Time   NA 140 11/03/2017 1432   K 4.1 11/03/2017 1432   CL 105 11/03/2017 1432   CO2 30 11/03/2017 1432   BUN 9 11/03/2017 1432   CREATININE 0.74 11/03/2017 1432   CREATININE 0.79 05/25/2012 1500   GLU 87 08/30/2017      Component Value Date/Time   CALCIUM 9.5 11/03/2017 1432   ALKPHOS 70 11/03/2017 1432   AST 26 11/03/2017 1432   ALT 42 (H) 11/03/2017 1432   BILITOT 0.4 11/03/2017 1432       Nm Lymph/gland  Result Date: 03/24/2018 CLINICAL DATA:  Posterior chest wall melanoma. EXAM: NUCLEAR MEDICINE LYMPHANGIOGRAPHY TECHNIQUE: Sequential images were obtained following intradermal injection of radiopharmaceutical at the tumor site in the left posterior chest wall. RADIOPHARMACEUTICALS:  0.5 mCi millipore-filtered Tc-67m sulfur colloid COMPARISON:  None FINDINGS: There is a large focus of intense uptake within the left posterior chest wall corresponding to the melanoma site. Radiotracer activity is identified within bilateral axilla. The most intense focus of increased uptake is in the right axilla. Static images over the pelvis were obtained demonstrating no significant tracer uptake. No activity identified within the cervical nodal chains or supraclavicular regions. IMPRESSION: Radiotracer injection within the left posterior chest wall melanoma site results in radiotracer localization in bilateral axillary lymph nodes, right greater than left. Electronically Signed   By: Kerby Moors M.D.   On: 03/24/2018 12:55   Nm Sentinel Node Inj-no Rpt (melanoma)  Result Date: 04/02/2018 Sulfur colloid was injected by the nuclear medicine technologist for  melanoma sentinel node.    Assessment and Plan:    47 year old  woman with the following issues:  1.  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  The final pathological staging was T2 a N1 with a depth of 1.1 mm, 1 out of 10 lymph nodes involved with the level of involvement including a less than 0.1 mm diameter without any extracapsular extension.  The natural course of this disease as well as treatment options and risk of relapse was reviewed today in detail.  She completed surgical procedure to give her the best chance of long-term melanoma disease control and cure including bilateral axillary lymph node sampling.  Options of therapy were reviewed today which include observation and surveillance versus immunotherapy adjuvantly.  The rationale for using adjuvant immunotherapy was reviewed today in detail.  Clinical trials that led to the approval of nivolumab was discussed today in detail.  Long-term complication associated with this therapy as well as the logistics of administration of immunotherapy intravenously for 12 months was reviewed.  Complications include nausea, fatigue, pruritus, thyroid disease and rarely pneumonitis, colitis and arthritis.  Alternatively active surveillance would be also reasonable given the small degree of lymph node involvement.  Less than 0.1 mm in diameter and 1 of the lymph nodes with excellent lymph node sampling putting her at small risk of disease relapse at this time but the risk is not negligible.  After discussion today, she will attend education class in the near future and will think about this option more before deciding.  We will obtain a PET CT scan to complete the staging purposes at this time.  Clearly, she has advanced disease on imaging studies, then she will require systemic therapy regardless.  2.  Skin protection and active surveillance: We have discussed strategies to protect her skin moving forward.  This will include wearing long sleeve, hats and using adequate sunblock as  well.  She will continue to follow with dermatology regularly every 3 to 4 months.  3.  Follow-up: We will be in the next 4 to 6 weeks to follow her progress and to start immunotherapy if she elects to do so.  60  minutes was spent with the patient face-to-face today.  More than 50% of time was dedicated to reviewing the natural course of her disease, treatment options and complication associated with this treatment.      Thank you for the referral. A copy of this consult has been forwarded to the requesting physician.

## 2018-04-22 NOTE — Telephone Encounter (Signed)
Scheduled appt per 9/11 los - gave patient AVS and calender per los.   

## 2018-04-27 DIAGNOSIS — L239 Allergic contact dermatitis, unspecified cause: Secondary | ICD-10-CM | POA: Diagnosis not present

## 2018-04-28 ENCOUNTER — Ambulatory Visit: Payer: BLUE CROSS/BLUE SHIELD | Attending: General Surgery | Admitting: Physical Therapy

## 2018-04-28 DIAGNOSIS — M25612 Stiffness of left shoulder, not elsewhere classified: Secondary | ICD-10-CM

## 2018-04-28 DIAGNOSIS — Z483 Aftercare following surgery for neoplasm: Secondary | ICD-10-CM | POA: Insufficient documentation

## 2018-04-28 DIAGNOSIS — M25611 Stiffness of right shoulder, not elsewhere classified: Secondary | ICD-10-CM | POA: Diagnosis not present

## 2018-04-28 DIAGNOSIS — M79602 Pain in left arm: Secondary | ICD-10-CM | POA: Insufficient documentation
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
## 2018-04-29 ENCOUNTER — Inpatient Hospital Stay: Payer: BLUE CROSS/BLUE SHIELD

## 2018-05-05 ENCOUNTER — Ambulatory Visit: Payer: BLUE CROSS/BLUE SHIELD | Admitting: Physical Therapy

## 2018-05-05 ENCOUNTER — Encounter: Payer: Self-pay | Admitting: Physical Therapy

## 2018-05-05 DIAGNOSIS — M79602 Pain in left arm: Secondary | ICD-10-CM

## 2018-05-05 DIAGNOSIS — M25611 Stiffness of right shoulder, not elsewhere classified: Secondary | ICD-10-CM

## 2018-05-05 DIAGNOSIS — M25612 Stiffness of left shoulder, not elsewhere classified: Secondary | ICD-10-CM

## 2018-05-05 DIAGNOSIS — Z483 Aftercare following surgery for neoplasm: Secondary | ICD-10-CM

## 2018-05-05 NOTE — Patient Instructions (Signed)
First of all, check with your insurance company to see if provider is in network    [facility] (for wigs and compression sleeves / gloves/gauntlets )  [address] [place], [place] [address] [phone]  Will file some insurances --- call for appointment   [facility] (for mastectomy prosthetics and garments) [address] [place], [place] [address] [phone] Will file some insurances --- call for appointment  [facility]  [address]  [place], [place] [address] [phone] Lower extremity garments  [facility] [address] [place], [place]  [address] [phone]  Cathy Rubel ( Medicaid certified lymphedema fitter) [phone] [email or web address]  [facility] [address] [place], [place] [address] [phone]  Other Resources: National Lymphedema Network:  www.lymphnet.org www.Klosetraining.com for patient articles and self manual lymph drainage information www.lymphedemablog.com has informative articles.  www.compressionguru.com www.lymphedemaproducts.com www.brightlifedirect.com 
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
## 2018-05-06 ENCOUNTER — Ambulatory Visit (HOSPITAL_COMMUNITY): Payer: BLUE CROSS/BLUE SHIELD

## 2018-05-07 ENCOUNTER — Ambulatory Visit: Payer: BLUE CROSS/BLUE SHIELD | Admitting: Physical Therapy

## 2018-05-07 ENCOUNTER — Encounter: Payer: Self-pay | Admitting: Physical Therapy

## 2018-05-07 DIAGNOSIS — Z483 Aftercare following surgery for neoplasm: Secondary | ICD-10-CM | POA: Diagnosis not present

## 2018-05-07 DIAGNOSIS — M25611 Stiffness of right shoulder, not elsewhere classified: Secondary | ICD-10-CM

## 2018-05-07 DIAGNOSIS — M25612 Stiffness of left shoulder, not elsewhere classified: Secondary | ICD-10-CM

## 2018-05-07 DIAGNOSIS — M79602 Pain in left arm: Secondary | ICD-10-CM | POA: Diagnosis not present

## 2018-05-07 DIAGNOSIS — C50911 Malignant neoplasm of unspecified site of right female breast: Secondary | ICD-10-CM | POA: Diagnosis not present
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
## 2018-05-11 DIAGNOSIS — L239 Allergic contact dermatitis, unspecified cause: Secondary | ICD-10-CM | POA: Diagnosis not present

## 2018-05-12 ENCOUNTER — Ambulatory Visit: Payer: BLUE CROSS/BLUE SHIELD | Attending: General Surgery | Admitting: Physical Therapy

## 2018-05-12 ENCOUNTER — Encounter: Payer: Self-pay | Admitting: Physical Therapy

## 2018-05-12 DIAGNOSIS — M25611 Stiffness of right shoulder, not elsewhere classified: Secondary | ICD-10-CM | POA: Insufficient documentation

## 2018-05-12 DIAGNOSIS — M25612 Stiffness of left shoulder, not elsewhere classified: Secondary | ICD-10-CM | POA: Diagnosis not present

## 2018-05-12 DIAGNOSIS — Z483 Aftercare following surgery for neoplasm: Secondary | ICD-10-CM | POA: Diagnosis not present

## 2018-05-12 DIAGNOSIS — M79602 Pain in left arm: Secondary | ICD-10-CM | POA: Diagnosis not present
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
## 2018-05-13 ENCOUNTER — Ambulatory Visit (HOSPITAL_COMMUNITY)
Admission: RE | Admit: 2018-05-13 | Discharge: 2018-05-13 | Disposition: A | Discharge status: Home / Self Care | Payer: BLUE CROSS/BLUE SHIELD | Source: Ambulatory Visit | Attending: Oncology | Admitting: Oncology

## 2018-05-13 ENCOUNTER — Telehealth: Payer: Self-pay | Admitting: *Deleted

## 2018-05-13 DIAGNOSIS — N200 Calculus of kidney: Secondary | ICD-10-CM | POA: Diagnosis not present

## 2018-05-13 DIAGNOSIS — K7689 Other specified diseases of liver: Secondary | ICD-10-CM | POA: Diagnosis not present

## 2018-05-13 DIAGNOSIS — K760 Fatty (change of) liver, not elsewhere classified: Secondary | ICD-10-CM | POA: Insufficient documentation

## 2018-05-13 DIAGNOSIS — I2510 Atherosclerotic heart disease of native coronary artery without angina pectoris: Secondary | ICD-10-CM | POA: Insufficient documentation

## 2018-05-13 DIAGNOSIS — C439 Malignant melanoma of skin, unspecified: Secondary | ICD-10-CM | POA: Diagnosis not present

## 2018-05-13 LAB — GLUCOSE, CAPILLARY: GLUCOSE-CAPILLARY: 100 mg/dL — AB (ref 70–99)

## 2018-05-13 MED ORDER — FLUDEOXYGLUCOSE F - 18 (FDG) INJECTION: 11.0000 | Freq: Once | INTRAVENOUS | Status: DC | PRN

## 2018-05-13 NOTE — Telephone Encounter (Signed)
-----   Message from Wyatt Portela, MD sent at 05/13/2018 10:39 AM EDT ----- Please let her know her PET scan is normal.

## 2018-05-13 NOTE — Telephone Encounter (Signed)
As noted below by Dr. Alen Blew, I informed patient that her PET scan is normal. She verbalized understanding.

## 2018-05-14 ENCOUNTER — Encounter: Payer: Self-pay | Admitting: Physical Therapy

## 2018-05-14 ENCOUNTER — Ambulatory Visit: Payer: BLUE CROSS/BLUE SHIELD | Admitting: Physical Therapy

## 2018-05-14 DIAGNOSIS — M79602 Pain in left arm: Secondary | ICD-10-CM

## 2018-05-14 DIAGNOSIS — Z483 Aftercare following surgery for neoplasm: Secondary | ICD-10-CM

## 2018-05-14 DIAGNOSIS — M25611 Stiffness of right shoulder, not elsewhere classified: Secondary | ICD-10-CM | POA: Diagnosis not present

## 2018-05-14 DIAGNOSIS — M25612 Stiffness of left shoulder, not elsewhere classified: Secondary | ICD-10-CM
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
## 2018-05-14 NOTE — Patient Instructions (Signed)
Access Code: [email or web address]  URL: [email or web address]  Date: 05/14/2018  Prepared by: Maudry Diego   Exercises  Isometric Shoulder Abduction at Wall - 10 reps - 3 sets - 1x daily - 7x weekly  Isometric Shoulder Extension at Wall - 10 reps - 3 sets - 1x daily - 7x weekly  Isometric Shoulder Flexion at Wall - 10 reps - 3 sets - 1x daily - 7x weekly  Isometric Shoulder External Rotation at Wall - 10 reps - 3 sets - 1x daily - 7x weekly

## 2018-05-19 ENCOUNTER — Encounter: Payer: BLUE CROSS/BLUE SHIELD | Admitting: Physical Therapy

## 2018-05-20 ENCOUNTER — Ambulatory Visit: Payer: BLUE CROSS/BLUE SHIELD | Admitting: Physical Therapy

## 2018-05-20 DIAGNOSIS — M25612 Stiffness of left shoulder, not elsewhere classified: Secondary | ICD-10-CM | POA: Diagnosis not present

## 2018-05-20 DIAGNOSIS — M25611 Stiffness of right shoulder, not elsewhere classified: Secondary | ICD-10-CM | POA: Diagnosis not present

## 2018-05-20 DIAGNOSIS — Z483 Aftercare following surgery for neoplasm: Secondary | ICD-10-CM

## 2018-05-20 DIAGNOSIS — M79602 Pain in left arm: Secondary | ICD-10-CM
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
## 2018-05-21 ENCOUNTER — Ambulatory Visit: Payer: BLUE CROSS/BLUE SHIELD | Admitting: Physical Therapy

## 2018-05-21 ENCOUNTER — Encounter: Payer: Self-pay | Admitting: Physical Therapy

## 2018-05-21 DIAGNOSIS — M79602 Pain in left arm: Secondary | ICD-10-CM | POA: Diagnosis not present

## 2018-05-21 DIAGNOSIS — M25611 Stiffness of right shoulder, not elsewhere classified: Secondary | ICD-10-CM

## 2018-05-21 DIAGNOSIS — M25612 Stiffness of left shoulder, not elsewhere classified: Secondary | ICD-10-CM | POA: Diagnosis not present

## 2018-05-21 DIAGNOSIS — Z483 Aftercare following surgery for neoplasm: Secondary | ICD-10-CM | POA: Diagnosis not present
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
## 2018-05-25 DIAGNOSIS — D1801 Hemangioma of skin and subcutaneous tissue: Secondary | ICD-10-CM | POA: Diagnosis not present

## 2018-05-25 DIAGNOSIS — Z85820 Personal history of malignant melanoma of skin: Secondary | ICD-10-CM | POA: Diagnosis not present

## 2018-05-25 DIAGNOSIS — L821 Other seborrheic keratosis: Secondary | ICD-10-CM | POA: Diagnosis not present

## 2018-05-25 DIAGNOSIS — D225 Melanocytic nevi of trunk: Secondary | ICD-10-CM | POA: Diagnosis not present

## 2018-05-26 ENCOUNTER — Ambulatory Visit: Payer: BLUE CROSS/BLUE SHIELD | Admitting: Physical Therapy

## 2018-05-26 ENCOUNTER — Encounter: Payer: Self-pay | Admitting: Physical Therapy

## 2018-05-26 DIAGNOSIS — M25612 Stiffness of left shoulder, not elsewhere classified: Secondary | ICD-10-CM

## 2018-05-26 DIAGNOSIS — M25611 Stiffness of right shoulder, not elsewhere classified: Secondary | ICD-10-CM

## 2018-05-26 DIAGNOSIS — M79602 Pain in left arm: Secondary | ICD-10-CM | POA: Diagnosis not present

## 2018-05-26 DIAGNOSIS — Z483 Aftercare following surgery for neoplasm: Secondary | ICD-10-CM

## 2018-05-26 NOTE — Therapy (Signed)
[place], [place], [address] Phone: [phone]   Fax:  [phone]  Physical Therapy Treatment  Patient Details  Name: Shannon Quinn MRN: [id number] Date of Birth: [birth year] Referring Provider (PT): Dr. Dalbert Batman    Encounter Date: 05/26/2018  PT End of Session - 05/26/18 1722    Visit Number  8    Number of Visits  17    Date for PT Re-Evaluation  06/30/18    PT Start Time  1696    PT Stop Time  1655    PT Time Calculation (min)  45 min    Activity Tolerance  Patient tolerated treatment well    Behavior During Therapy  [facility] for tasks assessed/performed       Past Medical History:  Diagnosis Date  . Colon polyps 2011   3 polpys removed  . Complication of anesthesia    "severe Reaction" "Physically ill"   . Deviated septum   . Family history of adverse reaction to anesthesia    violent when waking up from anesthesia - very sick  . Hypoglycemic syndrome 1996   during pregnancy  . Hypothyroidism   . Melanoma ([place])    03/10/2018  . PONV (postoperative nausea and vomiting)   . Shoulder injury    right - side happened from last surgery  . Thyroid disease   . Varicose vein of leg     Past Surgical History:  Procedure Laterality Date  . COLONOSCOPY     polyps  . LAPAROSCOPIC TOTAL HYSTERECTOMY  09/04/2017   Dr. Gaetano Net for endometriosis  . MELANOMA EXCISION WITH SENTINEL LYMPH NODE BIOPSY Left 04/02/2018   Procedure: INJECT BLUE DYE MELANOMA LEFT BACK WITH  WIDE LOCAL EXCISION MELANOMA LEFT BACK BILATERAL AXILLARY DEEP SENTINEL LYMPH NODE BIOPSY;  Surgeon: Fanny Skates, MD;  Location: [place];  Service: General;  Laterality: Left;  . TUBAL LIGATION    . WISDOM TOOTH EXTRACTION      There were no vitals filed for this visit.  Subjective Assessment - 05/26/18 1623    Subjective  Pt states she is doing better with her arm          [facility] PT Assessment - 05/26/18 0001       AROM   Right Shoulder Flexion  135 Degrees    Right Shoulder ABduction  110 Degrees    Left Shoulder Flexion  135 Degrees    Left Shoulder ABduction  110 Degrees                   [facility] Adult PT Treatment/Exercise - 05/26/18 0001      Exercises   Exercises  Shoulder;Lumbar      Lumbar Exercises: Supine   Clam  5 reps    Bridge  5 reps    Other Supine Lumbar Exercises  alternating knee to chest       Shoulder Exercises: Supine   Other Supine Exercises  dowel rod flexion with both arms to 90 degrees .  Pt is able to close left hand on dowel today but cannot raise arm > 90 degrees.  Also did shoulder protraction with both shoulders at 90 degrees       Shoulder Exercises: Sidelying   ABduction  AROM;Right;10 reps   on UE ranger, pt able to bring arm up to > 140 degrees     Shoulder Exercises: Standing   Other Standing Exercises  wall washing with towel on cabinet door  with each hand       Manual Therapy   Edema Management  pt is using the compression pads at her breasts and feels like it is helping her  and keeping her breasts from filling back up    Manual Lymphatic Drainage (MLD)  MLD sequence to abdominals and shoulders down to left arm with extra time spent on upper arm but down to fingers and hand with return along pathways.  gently moved shoulder and into external rotation, aduction and flexion . Pt could tolerate this as long as elbow was bent . At end of session pt was able to tolerate full elbow extension and forearm supination     Passive ROM  PROM to both shoulders as tolerated                PT Short Term Goals - 05/14/18 1700      PT SHORT TERM GOAL #1   Title  Pt will have active range of motin of both shoulders to at least 110 degrees so she can dress herself easier    Time  4    Period  Weeks    Status  On-going      PT SHORT TERM GOAL #2   Title  Pt will be independent in a basic home exercise program for range of motion    Period   Weeks    Status  On-going      PT SHORT TERM GOAL #3   Title  Pt will report the pain in her left arm is decreased by 25%    Time  4    Period  Weeks    Status  On-going        PT Long Term Goals - 04/28/18 2152      PT LONG TERM GOAL #1   Title  Pt will have bilateral shoulder flexion to 150 degress so that she can return to normal activities at work and home     Time  8    Period  Weeks    Status  New      PT LONG TERM GOAL #2   Title  Pt will report the pain in her left arm is decreased by 75%    Time  8    Period  Weeks    Status  New      PT LONG TERM GOAL #3   Title  Pt will decrease her Quick DASH score to < 40 indicating an improvment in the functional use of her arms     Baseline  79,55    Time  8    Period  Weeks    Status  New            Plan - 05/26/18 1722    Clinical Impression Statement  Pt is making slow , but steady improvement. She was able to upgrade her AAROM and closed chain activities today and was able to tolerate PROM much better.  She wants to be able to return to work by Nov. 3 .     Rehab Potential  Good    Clinical Impairments Affecting Rehab Potential  as above     PT Frequency  2x / week    PT Duration  8 weeks    PT Treatment/Interventions  ADLs/Self Care Home Management;Therapeutic activities;Patient/family education;Manual lymph drainage;Therapeutic exercise;Manual techniques;Passive range of motion;Scar mobilization;Taping;Compression bandaging    PT Next Visit Plan  Upgrad tg soft and chip packs as needed.  cont  with manuallymph draiange of proximal lateral chest. ,PROM of both shoulders and progress exercise as she is able progress core and LE exercise to progress to return to work        Patient will benefit from skilled therapeutic intervention in order to improve the following deficits and impairments:  Increased fascial restricitons, Decreased knowledge of use of DME, Decreased skin integrity, Impaired sensation, Pain,  Postural dysfunction, Decreased range of motion, Impaired UE functional use, Increased edema, Decreased scar mobility  Visit Diagnosis: Aftercare following surgery for neoplasm  Stiffness of right shoulder, not elsewhere classified  Stiffness of left shoulder, not elsewhere classified  Pain in left arm     Problem List Patient Active Problem List   Diagnosis Date Noted  . Melanoma of skin ([place]) 03/26/2018  . Eustachian tube dysfunction, bilateral 01/29/2017  . Right cervical radiculopathy 11/08/2015  . Anxiety and depression 03/26/2013  . Varicose veins 12/01/2012  . Hypothyroid 05/25/2012   Donato Heinz. Owens Shark PT  Norwood Levo 05/26/2018, 5:26 PM  [place], [place], [address] Phone: [phone]   Fax:  [phone]  Name: Shannon Quinn MRN: [id number] Date of Birth: 14-Jan-1971

## 2018-05-28 ENCOUNTER — Encounter: Payer: Self-pay | Admitting: Physical Therapy

## 2018-05-28 ENCOUNTER — Ambulatory Visit: Payer: BLUE CROSS/BLUE SHIELD | Admitting: Physical Therapy

## 2018-05-28 DIAGNOSIS — M79602 Pain in left arm: Secondary | ICD-10-CM

## 2018-05-28 DIAGNOSIS — Z483 Aftercare following surgery for neoplasm: Secondary | ICD-10-CM

## 2018-05-28 DIAGNOSIS — M25611 Stiffness of right shoulder, not elsewhere classified: Secondary | ICD-10-CM | POA: Diagnosis not present

## 2018-05-28 DIAGNOSIS — M25612 Stiffness of left shoulder, not elsewhere classified: Secondary | ICD-10-CM
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
## 2018-05-28 NOTE — Patient Instructions (Signed)
First of all, check with your insurance company to see if provider is in network    [facility] (for wigs and compression sleeves / gloves/gauntlets )  [address] [place], [place] [address] [phone]  Will file some insurances --- call for appointment   [facility] (for mastectomy prosthetics and garments) [address] [place], [place] [address] [phone] Will file some insurances --- call for appointment  [facility]  [address]  [place], [place] [address] [phone] Lower extremity garments  [facility] [address] [place], [place]  [address] [phone]  Cathy Rubel ( Medicaid certified lymphedema fitter) [phone] [email or web address]  Melissa Meares  [facility]  [phone]  [facility] [address] [place], [place] [address] [phone]  Other Resources: National Lymphedema Network:  www.lymphnet.org www.Klosetraining.com for patient articles and self manual lymph drainage information www.lymphedemablog.com has informative articles.  www.compressionguru.com www.lymphedemaproducts.com www.brightlifedirect.com www.compressionguru.com 

## 2018-06-02 ENCOUNTER — Ambulatory Visit (INDEPENDENT_AMBULATORY_CARE_PROVIDER_SITE_OTHER): Payer: BLUE CROSS/BLUE SHIELD | Admitting: Family Medicine

## 2018-06-02 ENCOUNTER — Telehealth: Payer: Self-pay | Admitting: Family Medicine

## 2018-06-02 ENCOUNTER — Ambulatory Visit: Payer: BLUE CROSS/BLUE SHIELD | Admitting: Physical Therapy

## 2018-06-02 ENCOUNTER — Encounter: Payer: Self-pay | Admitting: Family Medicine

## 2018-06-02 ENCOUNTER — Encounter: Payer: Self-pay | Admitting: Physical Therapy

## 2018-06-02 VITALS — BP 137/85 | HR 61 | Temp 98.1°F | Resp 20 | Ht 64.5 in | Wt 225.5 lb

## 2018-06-02 DIAGNOSIS — G8929 Other chronic pain: Secondary | ICD-10-CM | POA: Diagnosis not present

## 2018-06-02 DIAGNOSIS — M79602 Pain in left arm: Secondary | ICD-10-CM | POA: Diagnosis not present

## 2018-06-02 DIAGNOSIS — M25612 Stiffness of left shoulder, not elsewhere classified: Secondary | ICD-10-CM

## 2018-06-02 DIAGNOSIS — M25611 Stiffness of right shoulder, not elsewhere classified: Secondary | ICD-10-CM

## 2018-06-02 DIAGNOSIS — Z483 Aftercare following surgery for neoplasm: Secondary | ICD-10-CM

## 2018-06-02 DIAGNOSIS — M25511 Pain in right shoulder: Secondary | ICD-10-CM

## 2018-06-02 MED ORDER — DICLOFENAC SODIUM 75 MG PO TBEC: 75.0000 mg | DELAYED_RELEASE_TABLET | Freq: Two times a day (BID) | ORAL | 5 refills | Status: DC

## 2018-06-02 NOTE — Telephone Encounter (Signed)
Please call pt and ask her if she is able to regular aspirin? I believe the sulfadiazine allergy is from the sulfa containing portion and her known allergy to sulfa, not from the salicylate (ASA) portion. If that is the case, and she can take ASA, then she should be able to take the Voltaren we discussed and I can call that in for her.

## 2018-06-02 NOTE — Telephone Encounter (Signed)
Spoke with patient she states she is able to take ASA with no problems. Let her know we are calling in Voltaren. Patient verbalized understanding.
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
## 2018-06-02 NOTE — Patient Instructions (Signed)
I suspect a shoulder injury.  I have referred you to orthopedic to further evaluate your right shoulder.  I will look into the two med options and get back to you after I speak to pharmacy. In the meantime try to keep range of motion exercises daily.    Adhesive Capsulitis Adhesive capsulitis is inflammation of the tendons and ligaments that surround the shoulder joint (shoulder capsule). This condition causes the shoulder to become stiff and painful to move. Adhesive capsulitis is also called frozen shoulder. What are the causes? This condition may be caused by:  An injury to the shoulder joint.  Straining the shoulder.  Not moving the shoulder for a period of time. This can happen if your arm was injured or in a sling.  Long-standing health problems, such as: ? Diabetes. ? Thyroid problems. ? Heart disease. ? Stroke. ? Rheumatoid arthritis. ? Lung disease.  In some cases, the cause may not be known. What increases the risk? This condition is more likely to develop in:  Women.  People who are older than 49 years of age.  What are the signs or symptoms? Symptoms of this condition include:  Pain in the shoulder when moving the arm. There may also be pain when parts of the shoulder are touched. The pain is worse at night or when at rest.  Soreness or aching in the shoulder.  Inability to move the shoulder normally.  Muscle spasms.  How is this diagnosed? This condition is diagnosed with a physical exam and imaging tests, such as an X-ray or MRI. How is this treated? This condition may be treated with:  Treatment of the underlying cause or condition.  Physical therapy. This involves performing exercises to get the shoulder moving again.  Medicine. Medicine may be given to relieve pain, inflammation, or muscle spasms.  Steroid injections into the shoulder joint.  Shoulder manipulation. This is a procedure to move the shoulder into another position. It is done  after you are given a medicine to make you fall asleep (general anesthetic). The joint may also be injected with salt water at high pressure to break down scarring.  Surgery. This may be done in severe cases when other treatments have failed.  Although most people recover completely from adhesive capsulitis, some may not regain the full movement of the shoulder. Follow these instructions at home:  Take over-the-counter and prescription medicines only as told by your health care provider.  If you are being treated with physical therapy, follow instructions from your physical therapist.  Avoid exercises that put a lot of demand on your shoulder, such as throwing. These exercises can make pain worse.  If directed, apply ice to the injured area: ? Put ice in a plastic bag. ? Place a towel between your skin and the bag. ? Leave the ice on for 20 minutes, 2-3 times per day. Contact a health care provider if:  You develop new symptoms.  Your symptoms get worse. This information is not intended to replace advice given to you by your health care provider. Make sure you discuss any questions you have with your health care provider. Document Released: 05/26/2009 Document Revised: 01/04/2016 Document Reviewed: 11/21/2014 Elsevier Interactive Patient Education  [employer].

## 2018-06-04 DIAGNOSIS — M19019 Primary osteoarthritis, unspecified shoulder: Secondary | ICD-10-CM | POA: Insufficient documentation

## 2018-06-04 DIAGNOSIS — M19011 Primary osteoarthritis, right shoulder: Secondary | ICD-10-CM | POA: Diagnosis not present

## 2018-06-04 DIAGNOSIS — M7541 Impingement syndrome of right shoulder: Secondary | ICD-10-CM | POA: Insufficient documentation

## 2018-06-04 DIAGNOSIS — M25511 Pain in right shoulder: Secondary | ICD-10-CM | POA: Diagnosis not present

## 2018-06-04 HISTORY — DX: Impingement syndrome of right shoulder: M75.41

## 2018-06-08 ENCOUNTER — Other Ambulatory Visit: Payer: Self-pay | Admitting: General Surgery

## 2018-06-08 DIAGNOSIS — C4359 Malignant melanoma of other part of trunk: Secondary | ICD-10-CM

## 2018-06-09 ENCOUNTER — Encounter: Payer: Self-pay | Admitting: Physical Therapy

## 2018-06-09 ENCOUNTER — Ambulatory Visit: Payer: BLUE CROSS/BLUE SHIELD | Admitting: Physical Therapy

## 2018-06-09 DIAGNOSIS — M79602 Pain in left arm: Secondary | ICD-10-CM

## 2018-06-09 DIAGNOSIS — Z483 Aftercare following surgery for neoplasm: Secondary | ICD-10-CM | POA: Diagnosis not present

## 2018-06-09 DIAGNOSIS — M25612 Stiffness of left shoulder, not elsewhere classified: Secondary | ICD-10-CM | POA: Diagnosis not present

## 2018-06-09 DIAGNOSIS — M25611 Stiffness of right shoulder, not elsewhere classified: Secondary | ICD-10-CM | POA: Diagnosis not present
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
## 2018-06-09 NOTE — Patient Instructions (Signed)

## 2018-06-10 ENCOUNTER — Telehealth: Payer: Self-pay

## 2018-06-10 ENCOUNTER — Inpatient Hospital Stay: Payer: BLUE CROSS/BLUE SHIELD

## 2018-06-10 ENCOUNTER — Inpatient Hospital Stay: Payer: BLUE CROSS/BLUE SHIELD | Attending: Oncology | Admitting: Oncology

## 2018-06-10 VITALS — BP 148/88 | HR 55 | Temp 98.4°F | Resp 17 | Ht 64.5 in | Wt 223.8 lb

## 2018-06-10 DIAGNOSIS — C439 Malignant melanoma of skin, unspecified: Secondary | ICD-10-CM

## 2018-06-10 DIAGNOSIS — C4359 Malignant melanoma of other part of trunk: Secondary | ICD-10-CM | POA: Insufficient documentation

## 2018-06-10 LAB — CBC WITH DIFFERENTIAL (CANCER CENTER ONLY)
Abs Immature Granulocytes: 0.03 10*3/uL (ref 0.00–0.07)
BASOS ABS: 0.1 10*3/uL (ref 0.0–0.1)
Basophils Relative: 1 %
EOS ABS: 0.1 10*3/uL (ref 0.0–0.5)
EOS PCT: 1 %
HCT: 47.4 % — ABNORMAL HIGH (ref 36.0–46.0)
Hemoglobin: 15 g/dL (ref 12.0–15.0)
Immature Granulocytes: 0 %
Lymphocytes Relative: 25 %
Lymphs Abs: 2.8 10*3/uL (ref 0.7–4.0)
MCH: 29.6 pg (ref 26.0–34.0)
MCHC: 31.6 g/dL (ref 30.0–36.0)
MCV: 93.5 fL (ref 80.0–100.0)
MONO ABS: 0.8 10*3/uL (ref 0.1–1.0)
Monocytes Relative: 7 %
NRBC: 0 % (ref 0.0–0.2)
Neutro Abs: 7.5 10*3/uL (ref 1.7–7.7)
Neutrophils Relative %: 66 %
Platelet Count: 265 10*3/uL (ref 150–400)
RBC: 5.07 MIL/uL (ref 3.87–5.11)
RDW: 13.4 % (ref 11.5–15.5)
WBC: 11.3 10*3/uL — AB (ref 4.0–10.5)

## 2018-06-10 LAB — CMP (CANCER CENTER ONLY)
ALBUMIN: 4.3 g/dL (ref 3.5–5.0)
ALT: 95 U/L — ABNORMAL HIGH (ref 0–44)
ANION GAP: 8 (ref 5–15)
AST: 49 U/L — ABNORMAL HIGH (ref 15–41)
Alkaline Phosphatase: 97 U/L (ref 38–126)
BILIRUBIN TOTAL: 0.6 mg/dL (ref 0.3–1.2)
BUN: 19 mg/dL (ref 6–20)
CALCIUM: 9.9 mg/dL (ref 8.9–10.3)
CO2: 28 mmol/L (ref 22–32)
Chloride: 105 mmol/L (ref 98–111)
Creatinine: 1 mg/dL (ref 0.44–1.00)
GFR, Estimated: >60 mL/min (ref >60)
GLUCOSE: 80 mg/dL (ref 70–99)
POTASSIUM: 3.9 mmol/L (ref 3.5–5.1)
Sodium: 141 mmol/L (ref 135–145)
Total Protein: 8.4 g/dL — ABNORMAL HIGH (ref 6.5–8.1)

## 2018-06-10 LAB — LACTATE DEHYDROGENASE: LDH: 214 U/L — AB (ref 98–192)

## 2018-06-10 NOTE — Telephone Encounter (Signed)
Printed avs and calender of upcoming appointment. Per 10/30 los 

## 2018-06-10 NOTE — Progress Notes (Signed)
Hematology and Oncology Follow Up Visit  Shannon Quinn [id number] [birth year] 47 y.o. 06/10/2018 4:06 PM Quinn Marker, Shannon A, DO   Principle Diagnosis: 47 year old woman with melanoma of the left upper back diagnosed in August 2019.  She was found to have T2aN1 melanoma.  She had 1.1 mm depth with 1 out of 10 lymph node involved with 0.1 mm involvement in the lymph node.   Prior Therapy: She underwent wide excision with bilateral sentinel lymph node biopsy of the axilla.  The final pathology obtained on April 02, 2018 showed a residual melanoma of 0.8 mm T staging was T2a.  She had bilateral lymph node sentinel lymph node biopsy with 1 out of 10 lymph nodes were positive for melanoma.  Less than 0.1 mm tiny focus of melanoma seen on 1 of those 10 lymph nodes.   Current therapy: Active surveillance.  Interim History: Shannon Quinn presents today for a follow-up visit.  Since the last visit, she has continued participate in occupational therapy for her bilateral lymphedema after axillary lymph node sampling in August 2019.  She reports she is feeling Quinn at this time with improved range of motion as well as decreased swelling.  She denies any new rashes or lesions at this time.  She has resumed all activities of daily living and her appetite is back to normal.  She does not report any headaches, blurry vision, syncope or seizures. Does not report any fevers, chills or sweats.  Does not report any cough, wheezing or hemoptysis.  Does not report any chest pain, palpitation, orthopnea or leg edema.  Does not report any nausea, vomiting or abdominal pain.  Does not report any constipation or diarrhea.  Does not report any skeletal complaints.    Does not report frequency, urgency or hematuria.  Does not report any skin rashes or lesions. Does not report any heat or cold intolerance.  Does not report any lymphadenopathy or petechiae.  Does not report any anxiety or depression.  Remaining  review of systems is negative.    Medications: I have reviewed the patient's current medications.  Current Outpatient Medications  Medication Sig Dispense Refill  . aspirin-acetaminophen-caffeine (EXCEDRIN MIGRAINE) 250-250-65 MG tablet Take 2 tablets by mouth every 6 (six) hours as needed for headache.    . cetirizine (ZYRTEC) 10 MG tablet Take 10 mg by mouth daily.    . diclofenac (VOLTAREN) 75 MG EC tablet Take 1 tablet (75 mg total) by mouth 2 (two) times daily. 60 tablet 5  . estradiol (VIVELLE-DOT) 0.1 MG/24HR patch Place 0.1 mg onto the skin 2 (two) times a week.  12  . fluticasone (FLONASE) 50 MCG/ACT nasal spray Place into both nostrils daily.    Marland Kitchen ketoconazole (NIZORAL) 2 % cream 1 APPLICATION APPLY ON THE SKIN TWICE A DAY UNTIL 1 WEEK POST CLEAR  2  . liver oil-zinc oxide (DESITIN) 40 % ointment Apply 1 application topically as needed for irritation.    . traMADol (ULTRAM) 50 MG tablet Take 1-2 tablets (50-100 mg total) by mouth every 6 (six) hours as needed. 30 tablet 1  . triamcinolone (KENALOG) 0.025 % cream Apply 1 application topically 2 (two) times daily.     No current facility-administered medications for this visit.      Allergies:  Allergies  Allergen Reactions  . Neomycin     UNSPECIFIED REACTION   . Sulfasalazine Other (See Comments)  . Sulfonamide Derivatives     UNSPECIFIED REACTION   . Augmentin [Amoxicillin-Pot  Clavulanate] Nausea And Vomiting    amox ok.   . Hydrocodone Nausea And Vomiting    Severe vomiting  . Oxycodone Nausea And Vomiting    Severe vomiting  . Prednisone Other (See Comments)    insomnia    Past Medical History, Surgical history, Social history, and Family History were reviewed and updated.  Review of Systems:  Remaining ROS negative.  Physical Exam: Blood pressure (!) 148/88, pulse (!) 55, temperature 98.4 F (36.9 C), temperature source Oral, resp. rate 17, height 5' 4.5" (1.638 m), weight 223 lb 12.8 oz (101.5 kg), last  menstrual period 08/23/2017, SpO2 99 %. ECOG: 0 General appearance: alert and cooperative appeared without distress. Head: Normocephalic, without obvious abnormality Oropharynx: No oral thrush or ulcers. Eyes: No scleral icterus.  Pupils are equal and round reactive to light. Lymph nodes: Cervical, supraclavicular, and axillary nodes normal. Heart:regular rate and rhythm, S1, S2 normal, no murmur, click, rub or gallop Lung:chest clear, no wheezing, rales, normal symmetric air entry Abdomin: soft, non-tender, without masses or organomegaly. Neurological: No motor, sensory deficits.  Intact deep tendon reflexes. Skin: Mild erythema noted on her chest wall.  Well-healed scar bilateral axilla. Musculoskeletal: No joint deformity or effusion. Psychiatric: Mood and affect are appropriate.    Lab Results: Lab Results  Component Value Date   WBC 11.3 (H) 06/10/2018   HGB 15.0 06/10/2018   HCT 47.4 (H) 06/10/2018   MCV 93.5 06/10/2018   PLT 265 06/10/2018     Chemistry      Component Value Date/Time   NA 140 11/03/2017 1432   K 4.1 11/03/2017 1432   CL 105 11/03/2017 1432   CO2 30 11/03/2017 1432   BUN 9 11/03/2017 1432   CREATININE 0.74 11/03/2017 1432   CREATININE 0.79 05/25/2012 1500   GLU 87 08/30/2017      Component Value Date/Time   CALCIUM 9.5 11/03/2017 1432   ALKPHOS 70 11/03/2017 1432   AST 26 11/03/2017 1432   ALT 42 (H) 11/03/2017 1432   BILITOT 0.4 11/03/2017 1432       Radiological Studies:  CLINICAL DATA:  Initial treatment strategy for upper middle back melanoma status post wide local excision 04/02/2018 with positive right axillary sentinel lymph node.  EXAM: NUCLEAR MEDICINE PET WHOLE BODY  TECHNIQUE: 11.0 mCi F-18 FDG was injected intravenously. Full-ring PET imaging was performed from the skull base to thigh after the radiotracer. CT data was obtained and used for attenuation correction and anatomic localization.  Fasting blood glucose: 100  mg/dl  COMPARISON:  None.  FINDINGS: Mediastinal blood pool activity: SUV max 3.3  HEAD/NECK: No hypermetabolic activity in the scalp. No hypermetabolic cervical lymph nodes.  Incidental CT findings: none  CHEST:  There is low level hypermetabolism (max SUV 3.6) associated with ill-defined patchy subcutaneous fat stranding in the upper left back near the midline (series 4/image 70), presumably representing postsurgical change.  No hypermetabolic axillary, mediastinal or hilar lymph nodes. No hypermetabolic pulmonary findings.  Incidental CT findings: Coronary atherosclerosis. Right axillary surgical clips are noted. No acute consolidative airspace disease, lung masses or significant pulmonary nodules.  ABDOMEN/PELVIS: No abnormal hypermetabolic activity within the liver, pancreas, adrenal glands, or spleen. No hypermetabolic lymph nodes in the abdomen or pelvis.  Incidental CT findings: Diffuse hepatic steatosis. Simple 5.0 cm lateral segment left liver lobe cyst. Simple 1.7 cm posterior right liver lobe cyst. Nonobstructing 3 mm lower left renal stone. Hysterectomy.  SKELETON: No focal hypermetabolic activity to suggest skeletal metastasis.  Incidental  CT findings: none  EXTREMITIES: No abnormal hypermetabolic activity in the lower extremities.  Incidental CT findings: none  IMPRESSION: 1. Low-level hypermetabolism associated with ill-defined patchy subcutaneous fat stranding in the upper left back near the midline, presumably representing postsurgical change. 2. No evidence of hypermetabolic metastatic disease. 3. Chronic findings include: Coronary atherosclerosis. Diffuse hepatic steatosis. Nonobstructing left nephrolithiasis.  Impression and Plan:  47 year old woman with:  1.  T2a N1 Superficial spreading melanoma of the left upper back diagnosed in August 2019.  1 out of 10 lymph nodes involved with the level of involvement including a  less than 0.1 mm diameter without any extracapsular extension.  PET CT scan obtained in October 2019 was personally reviewed and discussed with the patient.  Options of therapy were reviewed today in detail with the patient and her family.  Given the low volume disease in 1 of the 10 lymph nodes the benefit from adjuvant immunotherapy remains questionable at this time.  Complication associated with adjuvant immunotherapy for 12 months was also reiterated.  After discussion today, she opted to continue with active surveillance only and hold off on immunotherapy unless he develops recurrent disease.  The plan is to continue with active surveillance and repeat imaging studies in 6 to 12 months.  She will have a repeat ultrasound of her axilla bilaterally by Dr. Dalbert Batman.  2.  Skin protection and active surveillance: I recommended she continue dermatology surveillance at this time.  3.  Follow-up: Months to follow her progress.  15  minutes was spent with the patient face-to-face today.  More than 50% of time was dedicated to discussing her disease status, reviewing imaging studies and coordinating plan of care.  Zola Button, MD 10/30/20194:06 PM

## 2018-06-11 ENCOUNTER — Ambulatory Visit: Payer: BLUE CROSS/BLUE SHIELD | Admitting: Physical Therapy

## 2018-06-11 ENCOUNTER — Encounter: Payer: Self-pay | Admitting: Physical Therapy

## 2018-06-11 DIAGNOSIS — Z483 Aftercare following surgery for neoplasm: Secondary | ICD-10-CM

## 2018-06-11 DIAGNOSIS — M25612 Stiffness of left shoulder, not elsewhere classified: Secondary | ICD-10-CM

## 2018-06-11 DIAGNOSIS — M25611 Stiffness of right shoulder, not elsewhere classified: Secondary | ICD-10-CM | POA: Diagnosis not present

## 2018-06-11 DIAGNOSIS — M79602 Pain in left arm: Secondary | ICD-10-CM

## 2018-06-11 NOTE — Patient Instructions (Signed)
Strengthening: Resisted Flexion    Cancer Rehab [phone]    Hold tubing with left arm at side. Pull forward and up. Move shoulder through pain-free range of motion. Repeat _5-10___ times per set. Do _1-2___ sessions per day.  Strengthening: Resisted Internal Rotation    Hold tubing in left hand, elbow at side and forearm out. Rotate forearm in across body. Repeat _5-10___ times per set. Do _1-2___ sessions per day.  Strengthening: Resisted Extension    Hold tubing in left hand, arm forward. Pull arm back, elbow straight. Repeat __5-10__ times per set. Do __1-2__ sessions per day.   Strengthening: Resisted External Rotation    Hold tubing in left hand, elbow at side and forearm across body. Rotate forearm out. Repeat _5-10___ times per set. Do __1-2__ sessions per day.   Then do with right arm.  Listen to what your body is telling you :)
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
## 2018-06-18 ENCOUNTER — Ambulatory Visit: Payer: BLUE CROSS/BLUE SHIELD | Attending: General Surgery | Admitting: Physical Therapy

## 2018-06-18 ENCOUNTER — Encounter: Payer: Self-pay | Admitting: Physical Therapy

## 2018-06-18 DIAGNOSIS — M25612 Stiffness of left shoulder, not elsewhere classified: Secondary | ICD-10-CM | POA: Insufficient documentation

## 2018-06-18 DIAGNOSIS — M25611 Stiffness of right shoulder, not elsewhere classified: Secondary | ICD-10-CM | POA: Diagnosis not present

## 2018-06-18 DIAGNOSIS — M79602 Pain in left arm: Secondary | ICD-10-CM | POA: Diagnosis not present

## 2018-06-18 DIAGNOSIS — Z483 Aftercare following surgery for neoplasm: Secondary | ICD-10-CM | POA: Diagnosis not present

## 2018-06-18 NOTE — Patient Instructions (Signed)
First of all, check with your insurance company to see if provider is in [place] (for wigs and compression sleeves / gloves/gauntlets )  [place], [place] [address] [phone]  Will file some insurances --- call for appointment   Second to [facility] (for mastectomy prosthetics and garments) [place], [place] [address] [phone] Will file some insurances --- call for appointment  [facility]  [address]  [place], [place] [address] [phone] Lower extremity garments  [facility] [address] [place],   [address] [place] ( Medicaid certified lymphedema fitter) [phone] [email or web address]  [place]  [place] [place],  [address] [phone]  Other Resources: National Lymphedema Network:  www.lymphnet.org www.Klosetraining.com for patient articles and self manual lymph drainage information www.lymphedemablog.com has informative articles.  DishTag.es.com www.lymphedemaproducts.com www.brightlifedirect.com   [employer] swell spots
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
## 2018-06-23 ENCOUNTER — Encounter: Payer: Self-pay | Admitting: Physical Therapy

## 2018-06-23 ENCOUNTER — Ambulatory Visit: Payer: BLUE CROSS/BLUE SHIELD | Admitting: Physical Therapy

## 2018-06-23 DIAGNOSIS — M25611 Stiffness of right shoulder, not elsewhere classified: Secondary | ICD-10-CM

## 2018-06-23 DIAGNOSIS — Z483 Aftercare following surgery for neoplasm: Secondary | ICD-10-CM | POA: Diagnosis not present

## 2018-06-23 DIAGNOSIS — M25612 Stiffness of left shoulder, not elsewhere classified: Secondary | ICD-10-CM

## 2018-06-23 DIAGNOSIS — M79602 Pain in left arm: Secondary | ICD-10-CM

## 2018-06-23 NOTE — Patient Instructions (Signed)
Access Code: [email or web address]  URL: [email or web address]  Date: 06/23/2018  Prepared by: Maudry Diego   Exercises  Standing with Forearms Thoracic Rotation - 10 reps - 3 sets - 1x daily - 7x weekly  Standing Thoracic Spine Stretch - 10 reps - 3 sets - 1x daily - 7x weekly  Shoulder Flexion Serratus Activation with Resistance - 10 reps - 3 sets - 1x daily - 7x weekly  Standing Serratus Punch with Resistance - 10 reps - 3 sets - 1x daily - 7x weekly
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
## 2018-06-25 ENCOUNTER — Ambulatory Visit: Payer: BLUE CROSS/BLUE SHIELD | Admitting: Physical Therapy

## 2018-06-25 ENCOUNTER — Encounter: Payer: Self-pay | Admitting: Physical Therapy

## 2018-06-25 DIAGNOSIS — M25612 Stiffness of left shoulder, not elsewhere classified: Secondary | ICD-10-CM

## 2018-06-25 DIAGNOSIS — M79602 Pain in left arm: Secondary | ICD-10-CM

## 2018-06-25 DIAGNOSIS — Z483 Aftercare following surgery for neoplasm: Secondary | ICD-10-CM | POA: Diagnosis not present

## 2018-06-25 DIAGNOSIS — M25611 Stiffness of right shoulder, not elsewhere classified: Secondary | ICD-10-CM
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
## 2018-06-30 ENCOUNTER — Encounter: Payer: Self-pay | Admitting: Physical Therapy

## 2018-06-30 ENCOUNTER — Ambulatory Visit: Payer: BLUE CROSS/BLUE SHIELD | Admitting: Physical Therapy

## 2018-06-30 DIAGNOSIS — M25612 Stiffness of left shoulder, not elsewhere classified: Secondary | ICD-10-CM

## 2018-06-30 DIAGNOSIS — M25611 Stiffness of right shoulder, not elsewhere classified: Secondary | ICD-10-CM | POA: Diagnosis not present

## 2018-06-30 DIAGNOSIS — M79602 Pain in left arm: Secondary | ICD-10-CM | POA: Diagnosis not present

## 2018-06-30 DIAGNOSIS — Z483 Aftercare following surgery for neoplasm: Secondary | ICD-10-CM | POA: Diagnosis not present
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
## 2018-07-02 ENCOUNTER — Encounter: Payer: BLUE CROSS/BLUE SHIELD | Admitting: Physical Therapy

## 2018-07-07 ENCOUNTER — Encounter: Payer: BLUE CROSS/BLUE SHIELD | Admitting: Physical Therapy

## 2018-08-24 ENCOUNTER — Ambulatory Visit: Payer: BLUE CROSS/BLUE SHIELD | Admitting: Family Medicine

## 2018-08-24 ENCOUNTER — Encounter: Payer: Self-pay | Admitting: Family Medicine

## 2018-08-24 VITALS — BP 134/85 | HR 61 | Temp 99.0°F | Resp 16 | Ht 64.5 in | Wt 225.0 lb

## 2018-08-24 DIAGNOSIS — J029 Acute pharyngitis, unspecified: Secondary | ICD-10-CM

## 2018-08-24 DIAGNOSIS — J0100 Acute maxillary sinusitis, unspecified: Secondary | ICD-10-CM

## 2018-08-24 LAB — POCT RAPID STREP A (OFFICE): Rapid Strep A Screen: NEGATIVE

## 2018-08-24 MED ORDER — FLUTICASONE PROPIONATE 50 MCG/ACT NA SUSP: 2.0000 | Freq: Every day | NASAL | 11 refills | Status: DC

## 2018-08-24 MED ORDER — DOXYCYCLINE HYCLATE 100 MG PO TABS: 100.0000 mg | ORAL_TABLET | Freq: Two times a day (BID) | ORAL | 0 refills | Status: DC

## 2018-08-24 NOTE — Progress Notes (Signed)
Shannon Quinn , 12-28-1970, 48 y.o., female MRN: [id number] Patient Care Team    Relationship Specialty Notifications Start End  Ma Hillock, DO PCP - General Family Medicine  11/03/17   Lady Gary, 49 [facility]    11/03/17   Everlene Farrier, MD Consulting Physician Obstetrics and Gynecology  11/03/17   Jerrell Belfast, MD Consulting Physician Otolaryngology  11/03/17   Alda Berthold, [place] Physician Neurology  11/03/17     Chief Complaint  Patient presents with  . Facial Pain    started at the 1st of the yr and gradually becoming worse  . Sore Throat     Subjective: Pt presents for an OV with complaints of sore throat of 2 weeks duration.  Associated symptoms include worsening symptoms with facial pressure, dizziness, sore throat, runny nose and cough.  No flu shot this year. Pt has tried OTC sinus meds to ease their symptoms.   No flowsheet data found.  Allergies  Allergen Reactions  . Neomycin     UNSPECIFIED REACTION   . Sulfasalazine Other (See Comments)  . Sulfonamide Derivatives     UNSPECIFIED REACTION   . Augmentin [Amoxicillin-Pot Clavulanate] Nausea And Vomiting    amox ok.   . Hydrocodone Nausea And Vomiting    Severe vomiting  . Oxycodone Nausea And Vomiting    Severe vomiting  . Prednisone Other (See Comments)    insomnia   Social History   Tobacco Use  . Smoking status: Never Smoker  . Smokeless tobacco: Never Used  Substance Use Topics  . Alcohol use: Yes    Alcohol/week: 0.0 standard drinks    Comment: occasional   Past Medical History:  Diagnosis Date  . Colon polyps 2011   3 polpys removed  . Complication of anesthesia    "severe Reaction" "Physically ill"   . Deviated septum   . Family history of adverse reaction to anesthesia    violent when waking up from anesthesia - very sick  . Hypoglycemic syndrome 1996   during pregnancy  . Hypothyroidism   . Melanoma ([place])    03/10/2018  . PONV (postoperative nausea and  vomiting)   . Shoulder injury    right - side happened from last surgery  . Thyroid disease   . Varicose vein of leg    Past Surgical History:  Procedure Laterality Date  . ABDOMINAL HYSTERECTOMY    . COLONOSCOPY     polyps  . LAPAROSCOPIC TOTAL HYSTERECTOMY  09/04/2017   Dr. Gaetano Net for endometriosis  . MELANOMA EXCISION WITH SENTINEL LYMPH NODE BIOPSY Left 04/02/2018   Procedure: INJECT BLUE DYE MELANOMA LEFT BACK WITH  WIDE LOCAL EXCISION MELANOMA LEFT BACK BILATERAL AXILLARY DEEP SENTINEL LYMPH NODE BIOPSY;  Surgeon: Fanny Skates, MD;  Location: [facility];  Service: General;  Laterality: Left;  . TUBAL LIGATION    . WISDOM TOOTH EXTRACTION     Family History  Problem Relation Age of Onset  . Arthritis Mother   . Alcohol abuse Father   . Arthritis Father   . Hyperlipidemia Father   . Stroke Father        2012  . Depression Father   . Diabetes Father   . Heart disease Father 49  . Diabetes Sister   . Dementia Maternal Grandmother   . Heart disease Maternal Grandfather 42  . Diabetes Paternal Grandmother   . COPD Paternal Grandfather        emphysema  . Cancer Paternal  Uncle        breast cancer   Allergies as of 08/24/2018      Reactions   Neomycin    UNSPECIFIED REACTION    Sulfasalazine Other (See Comments)   Sulfonamide Derivatives    UNSPECIFIED REACTION    Augmentin [amoxicillin-pot Clavulanate] Nausea And Vomiting   amox ok.    Hydrocodone Nausea And Vomiting   Severe vomiting   Oxycodone Nausea And Vomiting   Severe vomiting   Prednisone Other (See Comments)   insomnia      Medication List       Accurate as of August 24, 2018 12:35 PM. Always use your most recent med list.        aspirin-acetaminophen-caffeine 250-250-65 MG tablet Commonly known as:  EXCEDRIN MIGRAINE Take 2 tablets by mouth every 6 (six) hours as needed for headache.   cetirizine 10 MG tablet Commonly known as:  ZYRTEC Take 10 mg by mouth daily.     doxycycline 100 MG tablet Commonly known as:  VIBRA-TABS Take 1 tablet (100 mg total) by mouth 2 (two) times daily.   estradiol 0.1 MG/24HR patch Commonly known as:  VIVELLE-DOT Place 0.1 mg onto the skin 2 (two) times a week.   fluticasone 50 MCG/ACT nasal spray Commonly known as:  FLONASE Place 2 sprays into both nostrils daily.   ibuprofen 200 MG tablet Commonly known as:  ADVIL,MOTRIN Take 400 mg by mouth every 6 (six) hours as needed.   ketoconazole 2 % cream Commonly known as:  NIZORAL 1 APPLICATION APPLY ON THE SKIN TWICE A DAY UNTIL 1 WEEK POST CLEAR   liver oil-zinc oxide 40 % ointment Commonly known as:  DESITIN Apply 1 application topically as needed for irritation.   triamcinolone 0.025 % cream Commonly known as:  KENALOG Apply 1 application topically 2 (two) times daily.       All past medical history, surgical history, allergies, family history, immunizations andmedications were updated in the EMR today and reviewed under the history and medication portions of their EMR.     ROS: Negative, with the exception of above mentioned in HPI   Objective:  BP 134/85 (BP Location: Left Arm, Patient Position: Sitting, Cuff Size: Large)   Pulse 61   Temp 99 F (37.2 C) (Oral)   Resp 16   Ht 5' 4.5" (1.638 m)   Wt 225 lb (102.1 kg)   LMP 08/23/2017   SpO2 98%   BMI 38.02 kg/m  Body mass index is 38.02 kg/m. Gen: febrile. No acute distress. Nontoxic in appearance, well developed, well nourished.  HENT: AT. [place]. Bilateral TM visualized with bilateral fullness, no erythema. MMM, no oral lesions. Bilateral nares with severe erythema, swelling and drainage. Throat without erythema or exudates. Copious PND present. Cough present TTP maxillary sinus.  Eyes:Pupils Equal Round Reactive to light, Extraocular movements intact,  Conjunctiva without redness, discharge or icterus. Neck/lymp/endocrine: Supple,no lymphadenopathy CV: RRR  Chest: CTAB, no wheeze or crackles.  Good air movement, normal resp effort.  Skin: no rashes, purpura or petechiae.  Neuro: Normal gait. PERLA. EOMi. Alert. Oriented x3  No exam data present No results found. Results for orders placed or performed in visit on 08/24/18 (from the past 24 hour(s))  POCT rapid strep A     Status: Normal   Collection Time: 08/24/18 11:42 AM  Result Value Ref Range   Rapid Strep A Screen Negative Negative    Assessment/Plan: Lilybelle Mayeda is a 48 y.o. female present for OV  for  Sore throat - POCT rapid strep A--> negative today Acute non-recurrent maxillary sinusitis Rest, hydrate.  + flonase, mucinex (DM if cough), nettie pot or nasal saline.  Doxycyline  prescribed, take until completed.  If cough present it can last up to 6-8 weeks.  F/U 2 weeks of not improved.    Reviewed expectations re: course of current medical issues.  Discussed self-management of symptoms.  Outlined signs and symptoms indicating need for more acute intervention.  Patient verbalized understanding and all questions were answered.  Patient received an After-Visit Summary.    Orders Placed This Encounter  Procedures  . POCT rapid strep A     Note is dictated utilizing voice recognition software. Although note has been proof read prior to signing, occasional typographical errors still can be missed. If any questions arise, please do not hesitate to call for verification.   electronically signed by:  Howard Pouch, DO  [place]

## 2018-08-24 NOTE — Patient Instructions (Signed)
Rest, hydrate.  + flonase, mucinex (DM if cough), nettie pot or nasal saline.  Doxycyline  prescribed, take until completed.  If cough present it can last up to 6-8 weeks.  F/U 2 weeks of not improved.   Your strep test is negative.   You have a sinus infection with a good deal of mucous production causing your throat to be sore.     Sinusitis, Adult Sinusitis is soreness and swelling (inflammation) of your sinuses. Sinuses are hollow spaces in the bones around your face. They are located:  Around your eyes.  In the middle of your forehead.  Behind your nose.  In your cheekbones. Your sinuses and nasal passages are lined with a fluid called mucus. Mucus drains out of your sinuses. Swelling can trap mucus in your sinuses. This lets germs (bacteria, virus, or fungus) grow, which leads to infection. Most of the time, this condition is caused by a virus. What are the causes? This condition is caused by:  Allergies.  Asthma.  Germs.  Things that block your nose or sinuses.  Growths in the nose (nasal polyps).  Chemicals or irritants in the air.  Fungus (rare). What increases the risk? You are more likely to develop this condition if:  You have a weak body defense system (immune system).  You do a lot of swimming or diving.  You use nasal sprays too much.  You smoke. What are the signs or symptoms? The main symptoms of this condition are pain and a feeling of pressure around the sinuses. Other symptoms include:  Stuffy nose (congestion).  Runny nose (drainage).  Swelling and warmth in the sinuses.  Headache.  Toothache.  A cough that may get worse at night.  Mucus that collects in the throat or the back of the nose (postnasal drip).  Being unable to smell and taste.  Being very tired (fatigue).  A fever.  Sore throat.  Bad breath. How is this diagnosed? This condition is diagnosed based on:  Your symptoms.  Your medical history.  A physical  exam.  Tests to find out if your condition is short-term (acute) or long-term (chronic). Your doctor may: ? Check your nose for growths (polyps). ? Check your sinuses using a tool that has a light (endoscope). ? Check for allergies or germs. ? Do imaging tests, such as an MRI or CT scan. How is this treated? Treatment for this condition depends on the cause and whether it is short-term or long-term.  If caused by a virus, your symptoms should go away on their own within 10 days. You may be given medicines to relieve symptoms. They include: ? Medicines that shrink swollen tissue in the nose. ? Medicines that treat allergies (antihistamines). ? A spray that treats swelling of the nostrils. ? Rinses that help get rid of thick mucus in your nose (nasal saline washes).  If caused by bacteria, your doctor may wait to see if you will get better without treatment. You may be given antibiotic medicine if you have: ? A very bad infection. ? A weak body defense system.  If caused by growths in the nose, you may need to have surgery. Follow these instructions at home: Medicines  Take, use, or apply over-the-counter and prescription medicines only as told by your doctor. These may include nasal sprays.  If you were prescribed an antibiotic medicine, take it as told by your doctor. Do not stop taking the antibiotic even if you start to feel better. Hydrate and  humidify   Drink enough water to keep your pee (urine) pale yellow.  Use a cool mist humidifier to keep the humidity level in your home above 50%.  Breathe in steam for 10-15 minutes, 3-4 times a day, or as told by your doctor. You can do this in the bathroom while a hot shower is running.  Try not to spend time in cool or dry air. Rest  Rest as much as you can.  Sleep with your head raised (elevated).  Make sure you get enough sleep each night. General instructions   Put a warm, moist washcloth on your face 3-4 times a day,  or as often as told by your doctor. This will help with discomfort.  Wash your hands often with soap and water. If there is no soap and water, use hand sanitizer.  Do not smoke. Avoid being around people who are smoking (secondhand smoke).  Keep all follow-up visits as told by your doctor. This is important. Contact a doctor if:  You have a fever.  Your symptoms get worse.  Your symptoms do not get better within 10 days. Get help right away if:  You have a very bad headache.  You cannot stop throwing up (vomiting).  You have very bad pain or swelling around your face or eyes.  You have trouble seeing.  You feel confused.  Your neck is stiff.  You have trouble breathing. Summary  Sinusitis is swelling of your sinuses. Sinuses are hollow spaces in the bones around your face.  This condition is caused by tissues in your nose that become inflamed or swollen. This traps germs. These can lead to infection.  If you were prescribed an antibiotic medicine, take it as told by your doctor. Do not stop taking it even if you start to feel better.  Keep all follow-up visits as told by your doctor. This is important. This information is not intended to replace advice given to you by your health care provider. Make sure you discuss any questions you have with your health care provider. Document Released: 01/15/2008 Document Revised: 12/29/2017 Document Reviewed: 12/29/2017 Elsevier Interactive Patient Education  2019 [employer].

## 2018-08-27 ENCOUNTER — Other Ambulatory Visit: Payer: Self-pay | Admitting: General Surgery

## 2018-08-27 DIAGNOSIS — C4359 Malignant melanoma of other part of trunk: Secondary | ICD-10-CM

## 2018-09-09 ENCOUNTER — Other Ambulatory Visit: Payer: Self-pay | Admitting: General Surgery

## 2018-09-09 ENCOUNTER — Other Ambulatory Visit: Payer: Self-pay

## 2018-09-09 DIAGNOSIS — C4359 Malignant melanoma of other part of trunk: Secondary | ICD-10-CM

## 2018-09-11 ENCOUNTER — Ambulatory Visit
Admission: RE | Admit: 2018-09-11 | Discharge: 2018-09-11 | Disposition: A | Discharge status: Home / Self Care | Payer: BLUE CROSS/BLUE SHIELD | Source: Ambulatory Visit | Attending: General Surgery | Admitting: General Surgery

## 2018-09-11 ENCOUNTER — Other Ambulatory Visit: Payer: Self-pay | Admitting: General Surgery

## 2018-09-11 ENCOUNTER — Other Ambulatory Visit: Payer: BLUE CROSS/BLUE SHIELD

## 2018-09-11 DIAGNOSIS — R599 Enlarged lymph nodes, unspecified: Secondary | ICD-10-CM

## 2018-09-11 DIAGNOSIS — C4359 Malignant melanoma of other part of trunk: Secondary | ICD-10-CM

## 2018-09-11 DIAGNOSIS — N6489 Other specified disorders of breast: Secondary | ICD-10-CM | POA: Diagnosis not present

## 2018-09-11 DIAGNOSIS — N6002 Solitary cyst of left breast: Secondary | ICD-10-CM | POA: Diagnosis not present

## 2018-09-11 DIAGNOSIS — R928 Other abnormal and inconclusive findings on diagnostic imaging of breast: Secondary | ICD-10-CM | POA: Diagnosis not present

## 2018-09-11 LAB — HM MAMMOGRAPHY

## 2018-09-14 ENCOUNTER — Encounter: Payer: Self-pay | Admitting: Family Medicine

## 2018-09-14 DIAGNOSIS — N6310 Unspecified lump in the right breast, unspecified quadrant: Secondary | ICD-10-CM | POA: Diagnosis not present

## 2018-09-14 DIAGNOSIS — B369 Superficial mycosis, unspecified: Secondary | ICD-10-CM | POA: Diagnosis not present

## 2018-09-14 DIAGNOSIS — C4359 Malignant melanoma of other part of trunk: Secondary | ICD-10-CM | POA: Diagnosis not present

## 2018-09-14 DIAGNOSIS — Z6838 Body mass index (BMI) 38.0-38.9, adult: Secondary | ICD-10-CM | POA: Diagnosis not present

## 2018-09-15 ENCOUNTER — Ambulatory Visit
Admission: RE | Admit: 2018-09-15 | Discharge: 2018-09-15 | Disposition: A | Discharge status: Home / Self Care | Payer: BLUE CROSS/BLUE SHIELD | Source: Ambulatory Visit | Attending: General Surgery | Admitting: General Surgery

## 2018-09-15 DIAGNOSIS — N6489 Other specified disorders of breast: Secondary | ICD-10-CM

## 2018-09-15 DIAGNOSIS — R590 Localized enlarged lymph nodes: Secondary | ICD-10-CM | POA: Diagnosis not present

## 2018-09-15 DIAGNOSIS — N6011 Diffuse cystic mastopathy of right breast: Secondary | ICD-10-CM | POA: Diagnosis not present

## 2018-09-15 DIAGNOSIS — N6312 Unspecified lump in the right breast, upper inner quadrant: Secondary | ICD-10-CM | POA: Diagnosis not present

## 2018-09-15 DIAGNOSIS — R599 Enlarged lymph nodes, unspecified: Secondary | ICD-10-CM

## 2018-09-22 ENCOUNTER — Other Ambulatory Visit: Payer: Self-pay | Admitting: General Surgery

## 2018-09-22 DIAGNOSIS — R928 Other abnormal and inconclusive findings on diagnostic imaging of breast: Secondary | ICD-10-CM

## 2018-09-22 DIAGNOSIS — N6310 Unspecified lump in the right breast, unspecified quadrant: Secondary | ICD-10-CM | POA: Diagnosis not present

## 2018-09-22 DIAGNOSIS — Z90710 Acquired absence of both cervix and uterus: Secondary | ICD-10-CM | POA: Diagnosis not present

## 2018-09-22 DIAGNOSIS — C4359 Malignant melanoma of other part of trunk: Secondary | ICD-10-CM | POA: Diagnosis not present

## 2018-09-22 DIAGNOSIS — Z6838 Body mass index (BMI) 38.0-38.9, adult: Secondary | ICD-10-CM | POA: Diagnosis not present

## 2018-09-23 ENCOUNTER — Other Ambulatory Visit: Payer: Self-pay | Admitting: General Surgery

## 2018-09-23 DIAGNOSIS — R928 Other abnormal and inconclusive findings on diagnostic imaging of breast: Secondary | ICD-10-CM

## 2018-10-02 ENCOUNTER — Other Ambulatory Visit: Payer: Self-pay

## 2018-10-02 ENCOUNTER — Encounter: Payer: Self-pay | Admitting: Family Medicine

## 2018-10-02 ENCOUNTER — Encounter (HOSPITAL_BASED_OUTPATIENT_CLINIC_OR_DEPARTMENT_OTHER): Payer: Self-pay | Admitting: *Deleted

## 2018-10-03 NOTE — H&P (Signed)
Shannon Quinn Location: [facility] Patient #: [id number] DOB: 1970-10-21 Married / Language: English / Race: White Female       History of Present Illness       The patient is a 48 year old female who presents with a breast mass. This is a very pleasant 48 year old female. PCP is Oley Balm. Oncologist is [employer].     She is a melanoma patient of mine. 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 She had some residual melanoma in the back but the margins were widely negative. One out of 10 lymph nodes was positive and it was on the right side, 0.1 mm micrometastasis. No further surgery was felt to be warranted according to current guidelines and melanoma conference. PET/CT was negative on May 13, 2018. She suffered fungal dermatitis which ultimately resolved and she feels fine now.      Mammograms and axillary ultrasound were performed for screening on September 11, 2018. There was a small area of distortion within the upper right breast and a 1 cm right axillary lymph node. The left axilla looked normal. Biopsy of the right axillary lymph node was negative, benign lymphatic tissue, felt to be concordant. Biopsy of the right breast central superior showed benign breast parenchyma and mild fibrocystic change. This was felt to be discordant with excision recommended by Dr. Lovey Newcomer.    Family history reveals a maternal grandmother had breast cancer. Maternal cousin had ovarian cancer. Maternal uncle had melanoma, colon cancer, testicular cancer. Maternal uncle had kidney cancer. Paternal uncle had colon cancer.      Exam reveals no breast mass. Axillary exam was normal I told her that the risks of upgrade to cancer with excision was probably 5% maybe a little bit higher but no more. Most likely she does not have cancer. She doesn't want to take a chance and so we are going to  schedule her for right breast lumpectomy with radioactive seed localization. I discussed the indications, details, techniques, numerous risk of the surgery with her. She is aware of the risk of bleeding, infection, prostate deformity, chronic pain, reoperation of cancer. She understands all these issues well. All her questions were answered. She agrees with this plan.      We will continue axillary ultrasound, bilateral every 6 months for melanoma screening   Allergies  Sulfa Drugs  Neomycin  sulfaSALAzine *GASTROINTESTINAL AGENTS - MISC.*  Augmentin *PENICILLINS*  HYDROcodone-Acetaminophen *ANALGESICS - OPIOID*  oxyCODONE HCl *ANALGESICS - OPIOID*  PredniSONE (Pak) *CORTICOSTEROIDS*  Allergies Reconciled   Medication History Fluticasone Propionate (50MCG/ACT Suspension, Nasal) Active. Illusions C Breast Prosthesis (1 (one)) Active. [phone] Bilateral PermaForm - Integrated prosthesis in bra for ease of use or for compression; QTY: 4; Length of Use:6 months; Refills: 2 ; Replacement:) Medications Reconciled  Vitals Weight: 225.8 lb Height: 64in Body Surface Area: 2.06 m Body Mass Index: 38.76 kg/m  Temp.: 97.58F(Temporal)  Pulse: 78 (Regular)  P.OX: 98% (Room air) BP: 122/88 (Sitting, Right Wrist, Standard)       Physical Exam General Mental Status-Alert. General Appearance-Not in acute distress. Build & Nutrition-Well nourished. Posture-Normal posture. Gait-Normal.  Integumentary Note: Oblique incision up her central back is well-healed. Tissues are soft. No mass. No sign of melanoma anywhere in the upper and mid back. Nor the lower posterior neck.   Head and Neck Head-normocephalic, atraumatic with no lesions or palpable masses. Trachea-midline. Thyroid Gland Characteristics -  normal size and consistency and no palpable nodules.  Chest and Lung Exam Chest and lung exam reveals -on auscultation, normal breath sounds, no  adventitious sounds and normal vocal resonance.  Breast Note: Breasts are large. No palpable mass. No skin change. No axillary adenopathy. Bilateral axillary incisions well-healed   Cardiovascular Cardiovascular examination reveals -normal heart sounds, regular rate and rhythm with no murmurs and femoral artery auscultation bilaterally reveals normal pulses, no bruits, no thrills.  Abdomen Inspection Inspection of the abdomen reveals - No Hernias. Palpation/Percussion Palpation and Percussion of the abdomen reveal - Soft, Non Tender, No Rigidity (guarding), No hepatosplenomegaly and No Palpable abdominal masses.  Neurologic Neurologic evaluation reveals -alert and oriented x 3 with no impairment of recent or remote memory, normal attention span and ability to concentrate, normal sensation and normal coordination.  Musculoskeletal Normal Exam - Bilateral-Upper Extremity Strength Normal and Lower Extremity Strength Normal. Note: Range of motion both shoulders is excellent, 100%     Assessment & Plan   MASS OF RIGHT BREAST ON MAMMOGRAM (N63.10)   Recent axillary ultrasounds for melanoma screening showed a suspicious lymph node in the right axilla. This underwent core needle biopsy and was benign. The radiologist were comfortable with this assessment They also did mammograms and found an area of architectural distortion in the upper right breast. They then biopsied this area and it was benign but they were concerned and called this discordant, meaning they were still suspicious and recommended that this area be excised The chance of there being cancer in your right breast at this time is about 5% or slightly greater  You have decided to take no chance and schedule right breast lumpectomy with radioactive seed localization I agree that this is appropriate and recommended I have discussed the indications, techniques, and risk of the surgery with you in detail   MALIGNANT  MELANOMA OF UPPER BACK (C43.59)  BMI 38.0-38.9,ADULT (Z68.38) H/O: HYSTERECTOMY (Z90.710) HYPERLIPIDEMIA, MILD (E78.5)    Daily Crate M. Dalbert Batman, M.D., [facility] General and Minimally invasive Surgery Breast and Colorectal Surgery Office:   [phone] Pager:   [phone]

## 2018-10-07 ENCOUNTER — Encounter (HOSPITAL_BASED_OUTPATIENT_CLINIC_OR_DEPARTMENT_OTHER)
Admission: RE | Admit: 2018-10-07 | Discharge: 2018-10-07 | Disposition: A | Discharge status: Home / Self Care | Payer: BLUE CROSS/BLUE SHIELD | Source: Ambulatory Visit | Attending: General Surgery | Admitting: General Surgery

## 2018-10-07 DIAGNOSIS — Z01812 Encounter for preprocedural laboratory examination: Secondary | ICD-10-CM | POA: Diagnosis not present

## 2018-10-07 DIAGNOSIS — Z13220 Encounter for screening for lipoid disorders: Secondary | ICD-10-CM | POA: Diagnosis not present

## 2018-10-07 DIAGNOSIS — Z713 Dietary counseling and surveillance: Secondary | ICD-10-CM | POA: Diagnosis not present

## 2018-10-07 DIAGNOSIS — Z136 Encounter for screening for cardiovascular disorders: Secondary | ICD-10-CM | POA: Diagnosis not present

## 2018-10-07 LAB — CBC WITH DIFFERENTIAL/PLATELET
Abs Immature Granulocytes: 0.03 10*3/uL (ref 0.00–0.07)
Basophils Absolute: 0.1 10*3/uL (ref 0.0–0.1)
Basophils Relative: 1 %
Eosinophils Absolute: 0.1 10*3/uL (ref 0.0–0.5)
Eosinophils Relative: 2 %
HCT: 44.6 % (ref 36.0–46.0)
Hemoglobin: 14.2 g/dL (ref 12.0–15.0)
Immature Granulocytes: 0 %
Lymphocytes Relative: 33 %
Lymphs Abs: 2.8 10*3/uL (ref 0.7–4.0)
MCH: 29.3 pg (ref 26.0–34.0)
MCHC: 31.8 g/dL (ref 30.0–36.0)
MCV: 92 fL (ref 80.0–100.0)
Monocytes Absolute: 0.6 10*3/uL (ref 0.1–1.0)
Monocytes Relative: 7 %
Neutro Abs: 4.9 10*3/uL (ref 1.7–7.7)
Neutrophils Relative %: 57 %
Platelets: 288 10*3/uL (ref 150–400)
RBC: 4.85 MIL/uL (ref 3.87–5.11)
RDW: 13 % (ref 11.5–15.5)
WBC: 8.5 10*3/uL (ref 4.0–10.5)
nRBC: 0 % (ref 0.0–0.2)

## 2018-10-07 LAB — COMPREHENSIVE METABOLIC PANEL
ALT: 52 U/L — AB (ref 0–44)
AST: 34 U/L (ref 15–41)
Albumin: 4 g/dL (ref 3.5–5.0)
Alkaline Phosphatase: 75 U/L (ref 38–126)
Anion gap: 10 (ref 5–15)
BUN: 8 mg/dL (ref 6–20)
CO2: 24 mmol/L (ref 22–32)
CREATININE: 0.93 mg/dL (ref 0.44–1.00)
Calcium: 9.4 mg/dL (ref 8.9–10.3)
Chloride: 106 mmol/L (ref 98–111)
GFR calc Af Amer: >60 mL/min (ref >60)
GFR calc non Af Amer: >60 mL/min (ref >60)
Glucose, Bld: 80 mg/dL (ref 70–99)
Potassium: 3.5 mmol/L (ref 3.5–5.1)
Sodium: 140 mmol/L (ref 135–145)
Total Bilirubin: 0.5 mg/dL (ref 0.3–1.2)
Total Protein: 7.7 g/dL (ref 6.5–8.1)

## 2018-10-07 NOTE — Progress Notes (Signed)
Ensure pre surgery drink given with instructions to complete by 0530 dos, pt verbalized understanding. 

## 2018-10-08 ENCOUNTER — Ambulatory Visit
Admission: RE | Admit: 2018-10-08 | Discharge: 2018-10-08 | Disposition: A | Discharge status: Home / Self Care | Payer: BLUE CROSS/BLUE SHIELD | Source: Ambulatory Visit | Attending: General Surgery | Admitting: General Surgery

## 2018-10-08 DIAGNOSIS — R928 Other abnormal and inconclusive findings on diagnostic imaging of breast: Secondary | ICD-10-CM

## 2018-10-09 ENCOUNTER — Ambulatory Visit (HOSPITAL_BASED_OUTPATIENT_CLINIC_OR_DEPARTMENT_OTHER)
Admission: RE | Admit: 2018-10-09 | Discharge: 2018-10-09 | Disposition: A | Discharge status: Home / Self Care | Payer: BLUE CROSS/BLUE SHIELD | Attending: General Surgery | Admitting: General Surgery

## 2018-10-09 ENCOUNTER — Ambulatory Visit (HOSPITAL_BASED_OUTPATIENT_CLINIC_OR_DEPARTMENT_OTHER): Payer: BLUE CROSS/BLUE SHIELD | Admitting: Anesthesiology

## 2018-10-09 ENCOUNTER — Ambulatory Visit
Admission: RE | Admit: 2018-10-09 | Discharge: 2018-10-09 | Disposition: A | Discharge status: Home / Self Care | Payer: BLUE CROSS/BLUE SHIELD | Source: Ambulatory Visit | Attending: General Surgery | Admitting: General Surgery

## 2018-10-09 ENCOUNTER — Encounter (HOSPITAL_BASED_OUTPATIENT_CLINIC_OR_DEPARTMENT_OTHER)
Admission: RE | Disposition: A | Discharge status: Home / Self Care | Payer: Self-pay | Source: Home / Self Care | Attending: General Surgery

## 2018-10-09 ENCOUNTER — Encounter (HOSPITAL_BASED_OUTPATIENT_CLINIC_OR_DEPARTMENT_OTHER): Payer: Self-pay | Admitting: General Surgery

## 2018-10-09 DIAGNOSIS — Z885 Allergy status to narcotic agent status: Secondary | ICD-10-CM | POA: Insufficient documentation

## 2018-10-09 DIAGNOSIS — Z881 Allergy status to other antibiotic agents status: Secondary | ICD-10-CM | POA: Diagnosis not present

## 2018-10-09 DIAGNOSIS — Z880 Allergy status to penicillin: Secondary | ICD-10-CM | POA: Insufficient documentation

## 2018-10-09 DIAGNOSIS — Z882 Allergy status to sulfonamides status: Secondary | ICD-10-CM | POA: Insufficient documentation

## 2018-10-09 DIAGNOSIS — Z888 Allergy status to other drugs, medicaments and biological substances status: Secondary | ICD-10-CM | POA: Insufficient documentation

## 2018-10-09 DIAGNOSIS — N6489 Other specified disorders of breast: Secondary | ICD-10-CM | POA: Insufficient documentation

## 2018-10-09 DIAGNOSIS — Z85820 Personal history of malignant melanoma of skin: Secondary | ICD-10-CM | POA: Diagnosis not present

## 2018-10-09 DIAGNOSIS — Z803 Family history of malignant neoplasm of breast: Secondary | ICD-10-CM | POA: Diagnosis not present

## 2018-10-09 DIAGNOSIS — R928 Other abnormal and inconclusive findings on diagnostic imaging of breast: Secondary | ICD-10-CM

## 2018-10-09 DIAGNOSIS — E785 Hyperlipidemia, unspecified: Secondary | ICD-10-CM | POA: Diagnosis not present

## 2018-10-09 DIAGNOSIS — N6011 Diffuse cystic mastopathy of right breast: Secondary | ICD-10-CM | POA: Diagnosis not present

## 2018-10-09 DIAGNOSIS — Z886 Allergy status to analgesic agent status: Secondary | ICD-10-CM | POA: Insufficient documentation

## 2018-10-09 DIAGNOSIS — N6310 Unspecified lump in the right breast, unspecified quadrant: Secondary | ICD-10-CM | POA: Insufficient documentation

## 2018-10-09 DIAGNOSIS — Z8051 Family history of malignant neoplasm of kidney: Secondary | ICD-10-CM | POA: Insufficient documentation

## 2018-10-09 HISTORY — DX: Other abnormal and inconclusive findings on diagnostic imaging of breast: R92.8

## 2018-10-09 HISTORY — PX: BREAST LUMPECTOMY WITH RADIOACTIVE SEED LOCALIZATION: SHX6424

## 2018-10-09 HISTORY — DX: Lymphedema, not elsewhere classified: I89.0

## 2018-10-09 SURGERY — BREAST LUMPECTOMY WITH RADIOACTIVE SEED LOCALIZATION
Anesthesia: General | Site: Breast | Laterality: Right

## 2018-10-09 MED ORDER — CEFAZOLIN SODIUM-DEXTROSE 2-3 GM-%(50ML) IV SOLR
INTRAVENOUS | Status: DC | PRN
  Administered 2018-10-09: 3 g via INTRAVENOUS

## 2018-10-09 MED ORDER — MIDAZOLAM HCL 2 MG/2ML IJ SOLN
INTRAMUSCULAR | Status: AC
  Filled 2018-10-09: qty 2

## 2018-10-09 MED ORDER — TRAMADOL HCL 50 MG PO TABS
50.0000 mg | ORAL_TABLET | Freq: Once | ORAL | Status: AC
  Administered 2018-10-09: 50 mg via ORAL

## 2018-10-09 MED ORDER — CHLORHEXIDINE GLUCONATE CLOTH 2 % EX PADS: 6.0000 | MEDICATED_PAD | Freq: Once | CUTANEOUS | Status: DC

## 2018-10-09 MED ORDER — ONDANSETRON HCL 4 MG/2ML IJ SOLN
INTRAMUSCULAR | Status: AC
  Filled 2018-10-09: qty 2

## 2018-10-09 MED ORDER — CELECOXIB 200 MG PO CAPS
200.0000 mg | ORAL_CAPSULE | ORAL | Status: AC
  Administered 2018-10-09: 200 mg via ORAL

## 2018-10-09 MED ORDER — LIDOCAINE HCL (CARDIAC) PF 100 MG/5ML IV SOSY
PREFILLED_SYRINGE | INTRAVENOUS | Status: DC | PRN
  Administered 2018-10-09: 40 mg via INTRAVENOUS

## 2018-10-09 MED ORDER — LIDOCAINE 2% (20 MG/ML) 5 ML SYRINGE
INTRAMUSCULAR | Status: AC
  Filled 2018-10-09: qty 5

## 2018-10-09 MED ORDER — PROPOFOL 10 MG/ML IV BOLUS
INTRAVENOUS | Status: AC
  Filled 2018-10-09: qty 20

## 2018-10-09 MED ORDER — ROCURONIUM BROMIDE 100 MG/10ML IV SOLN
INTRAVENOUS | Status: DC | PRN
  Administered 2018-10-09: 30 mg via INTRAVENOUS

## 2018-10-09 MED ORDER — MIDAZOLAM HCL 2 MG/2ML IJ SOLN: 1.0000 mg | INTRAMUSCULAR | Status: DC | PRN

## 2018-10-09 MED ORDER — LACTATED RINGERS IV SOLN
INTRAVENOUS | Status: DC
  Administered 2018-10-09 (×2): via INTRAVENOUS

## 2018-10-09 MED ORDER — FENTANYL CITRATE (PF) 100 MCG/2ML IJ SOLN
25.0000 ug | INTRAMUSCULAR | Status: DC | PRN
  Administered 2018-10-09: 25 ug via INTRAVENOUS
  Administered 2018-10-09: 50 ug via INTRAVENOUS
  Administered 2018-10-09: 25 ug via INTRAVENOUS

## 2018-10-09 MED ORDER — FENTANYL CITRATE (PF) 100 MCG/2ML IJ SOLN
50.0000 ug | INTRAMUSCULAR | Status: DC | PRN
  Administered 2018-10-09: 100 ug via INTRAVENOUS

## 2018-10-09 MED ORDER — ACETAMINOPHEN 500 MG PO TABS
1000.0000 mg | ORAL_TABLET | ORAL | Status: AC
  Administered 2018-10-09: 1000 mg via ORAL

## 2018-10-09 MED ORDER — GABAPENTIN 300 MG PO CAPS
300.0000 mg | ORAL_CAPSULE | ORAL | Status: AC
  Administered 2018-10-09: 300 mg via ORAL

## 2018-10-09 MED ORDER — DEXAMETHASONE SODIUM PHOSPHATE 4 MG/ML IJ SOLN
INTRAMUSCULAR | Status: DC | PRN
  Administered 2018-10-09: 10 mg via INTRAVENOUS

## 2018-10-09 MED ORDER — GABAPENTIN 300 MG PO CAPS
ORAL_CAPSULE | ORAL | Status: AC
  Filled 2018-10-09: qty 1

## 2018-10-09 MED ORDER — SUCCINYLCHOLINE CHLORIDE 20 MG/ML IJ SOLN
INTRAMUSCULAR | Status: DC | PRN
  Administered 2018-10-09: 200 mg via INTRAVENOUS

## 2018-10-09 MED ORDER — TRAMADOL HCL 50 MG PO TABS: 50.0000 mg | ORAL_TABLET | Freq: Four times a day (QID) | ORAL | 0 refills | Status: DC | PRN

## 2018-10-09 MED ORDER — DEXTROSE 5 % IV SOLN: 3.0000 g | INTRAVENOUS | Status: DC

## 2018-10-09 MED ORDER — TRAMADOL HCL 50 MG PO TABS
ORAL_TABLET | ORAL | Status: AC
  Filled 2018-10-09: qty 1

## 2018-10-09 MED ORDER — BUPIVACAINE-EPINEPHRINE 0.25% -1:200000 IJ SOLN
INTRAMUSCULAR | Status: DC | PRN
  Administered 2018-10-09: 10 mL

## 2018-10-09 MED ORDER — ONDANSETRON HCL 4 MG/2ML IJ SOLN
4.0000 mg | Freq: Once | INTRAMUSCULAR | Status: AC | PRN
  Administered 2018-10-09: 4 mg via INTRAVENOUS

## 2018-10-09 MED ORDER — CELECOXIB 200 MG PO CAPS
ORAL_CAPSULE | ORAL | Status: AC
  Filled 2018-10-09: qty 1

## 2018-10-09 MED ORDER — ACETAMINOPHEN 500 MG PO TABS
ORAL_TABLET | ORAL | Status: AC
  Filled 2018-10-09: qty 2

## 2018-10-09 MED ORDER — SCOPOLAMINE 1 MG/3DAYS TD PT72
1.0000 | MEDICATED_PATCH | Freq: Once | TRANSDERMAL | Status: DC | PRN
  Administered 2018-10-09: 1.5 mg via TRANSDERMAL

## 2018-10-09 MED ORDER — CEFAZOLIN SODIUM-DEXTROSE 2-4 GM/100ML-% IV SOLN
INTRAVENOUS | Status: AC
  Filled 2018-10-09: qty 100

## 2018-10-09 MED ORDER — FENTANYL CITRATE (PF) 100 MCG/2ML IJ SOLN
INTRAMUSCULAR | Status: AC
  Filled 2018-10-09: qty 2

## 2018-10-09 MED ORDER — SCOPOLAMINE 1 MG/3DAYS TD PT72
MEDICATED_PATCH | TRANSDERMAL | Status: AC
  Filled 2018-10-09: qty 1

## 2018-10-09 MED ORDER — SUGAMMADEX SODIUM 500 MG/5ML IV SOLN
INTRAVENOUS | Status: DC | PRN
  Administered 2018-10-09: 500 mg via INTRAVENOUS

## 2018-10-09 MED ORDER — PHENYLEPHRINE HCL 10 MG/ML IJ SOLN
INTRAMUSCULAR | Status: DC | PRN
  Administered 2018-10-09: 200 ug via INTRAVENOUS

## 2018-10-09 MED ORDER — PROPOFOL 10 MG/ML IV BOLUS
INTRAVENOUS | Status: DC | PRN
  Administered 2018-10-09: 200 mg via INTRAVENOUS
  Administered 2018-10-09: 100 mg via INTRAVENOUS

## 2018-10-09 MED ORDER — DEXAMETHASONE SODIUM PHOSPHATE 10 MG/ML IJ SOLN
INTRAMUSCULAR | Status: AC
  Filled 2018-10-09: qty 1

## 2018-10-09 SURGICAL SUPPLY — 69 items
ADH SKN CLS APL DERMABOND .7 (GAUZE/BANDAGES/DRESSINGS) ×1
APL SKNCLS STERI-STRIP NONHPOA (GAUZE/BANDAGES/DRESSINGS)
APPLIER CLIP 9.375 MED OPEN (MISCELLANEOUS) ×4
APR CLP MED 9.3 20 MLT OPN (MISCELLANEOUS) ×2
BENZOIN TINCTURE PRP APPL 2/3 (GAUZE/BANDAGES/DRESSINGS) IMPLANT
BINDER BREAST 3XL (GAUZE/BANDAGES/DRESSINGS) ×1 IMPLANT
BINDER BREAST LRG (GAUZE/BANDAGES/DRESSINGS) IMPLANT
BINDER BREAST MEDIUM (GAUZE/BANDAGES/DRESSINGS) IMPLANT
BINDER BREAST XLRG (GAUZE/BANDAGES/DRESSINGS) IMPLANT
BINDER BREAST XXLRG (GAUZE/BANDAGES/DRESSINGS) IMPLANT
BLADE HEX COATED 2.75 (ELECTRODE) ×2 IMPLANT
BLADE SURG 10 STRL SS (BLADE) IMPLANT
BLADE SURG 15 STRL LF DISP TIS (BLADE) ×1 IMPLANT
BLADE SURG 15 STRL SS (BLADE) ×2
CANISTER SUC SOCK COL 7IN (MISCELLANEOUS) IMPLANT
CANISTER SUCT 1200ML W/VALVE (MISCELLANEOUS) ×2 IMPLANT
CHLORAPREP W/TINT 26ML (MISCELLANEOUS) ×1 IMPLANT
CLIP APPLIE 9.375 MED OPEN (MISCELLANEOUS) IMPLANT
COVER BACK TABLE 60X90IN (DRAPES) ×2 IMPLANT
COVER MAYO STAND STRL (DRAPES) ×2 IMPLANT
COVER PROBE W GEL 5X96 (DRAPES) ×2 IMPLANT
COVER WAND RF STERILE (DRAPES) IMPLANT
DECANTER SPIKE VIAL GLASS SM (MISCELLANEOUS) IMPLANT
DERMABOND ADVANCED (GAUZE/BANDAGES/DRESSINGS) ×1
DERMABOND ADVANCED .7 DNX12 (GAUZE/BANDAGES/DRESSINGS) ×1 IMPLANT
DRAPE LAPAROSCOPIC ABDOMINAL (DRAPES) ×2 IMPLANT
DRAPE UTILITY XL STRL (DRAPES) ×2 IMPLANT
DRSG PAD ABDOMINAL 8X10 ST (GAUZE/BANDAGES/DRESSINGS) ×2 IMPLANT
ELECT REM PT RETURN 9FT ADLT (ELECTROSURGICAL) ×2
ELECTRODE REM PT RTRN 9FT ADLT (ELECTROSURGICAL) ×1 IMPLANT
GAUZE SPONGE 4X4 12PLY STRL LF (GAUZE/BANDAGES/DRESSINGS) ×2 IMPLANT
GLOVE BIOGEL PI IND STRL 7.0 (GLOVE) IMPLANT
GLOVE BIOGEL PI INDICATOR 7.0 (GLOVE) ×1
GLOVE EUDERMIC 7 POWDERFREE (GLOVE) ×2 IMPLANT
GLOVE EXAM NITRILE MD LF STRL (GLOVE) ×1 IMPLANT
GLOVE SURG SYN 7.5  E (GLOVE) ×1
GLOVE SURG SYN 7.5 E (GLOVE) ×1 IMPLANT
GLOVE SURG SYN 7.5 PF PI (GLOVE) IMPLANT
GOWN STRL REUS W/ TWL LRG LVL3 (GOWN DISPOSABLE) ×1 IMPLANT
GOWN STRL REUS W/ TWL XL LVL3 (GOWN DISPOSABLE) ×1 IMPLANT
GOWN STRL REUS W/TWL LRG LVL3 (GOWN DISPOSABLE) ×2
GOWN STRL REUS W/TWL XL LVL3 (GOWN DISPOSABLE) ×4
ILLUMINATOR WAVEGUIDE N/F (MISCELLANEOUS) IMPLANT
KIT MARKER MARGIN INK (KITS) ×2 IMPLANT
LIGHT WAVEGUIDE WIDE FLAT (MISCELLANEOUS) IMPLANT
NDL HYPO 25X1 1.5 SAFETY (NEEDLE) ×1 IMPLANT
NEEDLE HYPO 25X1 1.5 SAFETY (NEEDLE) ×2 IMPLANT
NS IRRIG 1000ML POUR BTL (IV SOLUTION) ×2 IMPLANT
PACK BASIN DAY SURGERY FS (CUSTOM PROCEDURE TRAY) ×2 IMPLANT
PENCIL BUTTON HOLSTER BLD 10FT (ELECTRODE) ×2 IMPLANT
SHEET MEDIUM DRAPE 40X70 STRL (DRAPES) IMPLANT
SLEEVE SCD COMPRESS KNEE MED (MISCELLANEOUS) ×2 IMPLANT
SPONGE LAP 18X18 RF (DISPOSABLE) IMPLANT
SPONGE LAP 4X18 RFD (DISPOSABLE) ×3 IMPLANT
STRIP CLOSURE SKIN 1/2X4 (GAUZE/BANDAGES/DRESSINGS) IMPLANT
SUT ETHILON 3 0 FSL (SUTURE) IMPLANT
SUT MNCRL AB 4-0 PS2 18 (SUTURE) ×2 IMPLANT
SUT SILK 2 0 SH (SUTURE) ×2 IMPLANT
SUT VIC AB 2-0 CT1 27 (SUTURE)
SUT VIC AB 2-0 CT1 TAPERPNT 27 (SUTURE) IMPLANT
SUT VIC AB 3-0 SH 27 (SUTURE)
SUT VIC AB 3-0 SH 27X BRD (SUTURE) IMPLANT
SUT VICRYL 3-0 CR8 SH (SUTURE) ×2 IMPLANT
SYR 10ML LL (SYRINGE) ×2 IMPLANT
TOWEL GREEN STERILE FF (TOWEL DISPOSABLE) ×2 IMPLANT
TRAY DSU PREP LF (CUSTOM PROCEDURE TRAY) ×1 IMPLANT
TRAY FAXITRON CT DISP (TRAY / TRAY PROCEDURE) ×2 IMPLANT
TUBE CONNECTING 20X1/4 (TUBING) ×2 IMPLANT
YANKAUER SUCT BULB TIP NO VENT (SUCTIONS) ×2 IMPLANT

## 2018-10-09 NOTE — Discharge Instructions (Signed)
Tylenol taken at 7:40 this morning. May take Tylenol again at 2pm if needed.  May take ibuprofen again at 2pm if needed. Alternate with Tylenol. Please see Dr Darrel Hoover pain medicine dosing information and charts below.   [place] Office Phone Number [phone]  BREAST BIOPSY/ PARTIAL MASTECTOMY: POST OP INSTRUCTIONS  Always review your discharge instruction sheet given to you by the facility where your surgery was performed.  IF YOU HAVE DISABILITY OR FAMILY LEAVE FORMS, YOU MUST BRING THEM TO THE OFFICE FOR PROCESSING.  DO NOT GIVE THEM TO YOUR DOCTOR.  1. A prescription for pain medication may be given to you upon discharge.  Take your pain medication as prescribed, if needed.  If narcotic pain medicine is not needed, then you may take acetaminophen (Tylenol) or ibuprofen (Advil) as needed. 2. Take your usually prescribed medications unless otherwise directed 3. If you need a refill on your pain medication, please contact your pharmacy.  They will contact our office to request authorization.  Prescriptions will not be filled after 5pm or on week-ends. 4. You should eat very light the first 24 hours after surgery, such as soup, crackers, pudding, etc.  Resume your normal diet the day after surgery. 5. Most patients will experience some swelling and bruising in the breast.  Ice packs and a good support bra will help.  Swelling and bruising can take several days to resolve.  6. It is common to experience some constipation if taking pain medication after surgery.  Increasing fluid intake and taking a stool softener will usually help or prevent this problem from occurring.  A mild laxative (Milk of Magnesia or Miralax) should be taken according to package directions if there are no bowel movements after 48 hours. 7. Unless discharge instructions indicate otherwise, you may remove your bandages 24-48 hours after surgery, and you may shower at that time.  You may have steri-strips  (small skin tapes) in place directly over the incision.  These strips should be left on the skin for 7-10 days.  If your surgeon used skin glue on the incision, you may shower in 24 hours.  The glue will flake off over the next 2-3 weeks.  Any sutures or staples will be removed at the office during your follow-up visit. 8. ACTIVITIES:  You may resume regular daily activities (gradually increasing) beginning the next day.  Wearing a good support bra or sports bra minimizes pain and swelling.  You may have sexual intercourse when it is comfortable. a. You may drive when you no longer are taking prescription pain medication, you can comfortably wear a seatbelt, and you can safely maneuver your car and apply brakes. b. RETURN TO WORK:  ______________________________________________________________________________________ 9. You should see your doctor in the office for a follow-up appointment approximately two weeks after your surgery.  Your doctors nurse will typically make your follow-up appointment when she calls you with your pathology report.  Expect your pathology report 2-3 business days after your surgery.  You may call to check if you do not hear from [name] after three days. 10. OTHER INSTRUCTIONS: _______________________________________________________________________________________________ _____________________________________________________________________________________________________________________________________ _____________________________________________________________________________________________________________________________________ _____________________________________________________________________________________________________________________________________  WHEN TO CALL YOUR DOCTOR: 1. Fever over 101.0 2. Nausea and/or vomiting. 3. Extreme swelling or bruising. 4. Continued bleeding from incision. 5. Increased pain, redness, or drainage from the incision.  The clinic  staff is available to answer your questions during regular business hours.  Please dont hesitate to call and ask to speak to one of the nurses for clinical  concerns.  If you have a medical emergency, go to the nearest emergency room or call 911.  A surgeon from [facility] is always on call at the hospital.  For further questions, please visit [email or web address]               Managing Your Pain After Surgery Without Opioids    Thank you for participating in our program to help patients manage their pain after surgery without opioids. This is part of our effort to provide you with the best care possible, without exposing you or your family to the risk that opioids pose.  What pain can I expect after surgery? You can expect to have some pain after surgery. This is normal. The pain is typically worse the day after surgery, and quickly begins to get better. Many studies have found that many patients are able to manage their pain after surgery with Over-the-Counter (OTC) medications such as Tylenol and Motrin. If you have a condition that does not allow you to take Tylenol or Motrin, notify your surgical team.  How will I manage my pain? The best strategy for controlling your pain after surgery is around the clock pain control with Tylenol (acetaminophen) and Motrin (ibuprofen or Advil). Alternating these medications with each other allows you to maximize your pain control. In addition to Tylenol and Motrin, you can use heating pads or ice packs on your incisions to help reduce your pain.  How will I alternate your regular strength over-the-counter pain medication? You will take a dose of pain medication every three hours. ; Start by taking 650 mg of Tylenol (2 pills of 325 mg) ; 3 hours later take 600 mg of Motrin (3 pills of 200 mg) ; 3 hours after taking the Motrin take 650 mg of Tylenol ; 3 hours after that take 600 mg of Motrin.   - 1 -  See  example - if your first dose of Tylenol is at 12:00 PM   12:00 PM Tylenol 650 mg (2 pills of 325 mg)  3:00 PM Motrin 600 mg (3 pills of 200 mg)  6:00 PM Tylenol 650 mg (2 pills of 325 mg)  9:00 PM Motrin 600 mg (3 pills of 200 mg)  Continue alternating every 3 hours   We recommend that you follow this schedule around-the-clock for at least 3 days after surgery, or until you feel that it is no longer needed. Use the table on the last page of this handout to keep track of the medications you are taking. Important: Do not take more than 3000mg  of Tylenol or 3200mg  of Motrin in a 24-hour period. Do not take ibuprofen/Motrin if you have a history of bleeding stomach ulcers, severe kidney disease, &/or actively taking a blood thinner  What if I still have pain? If you have pain that is not controlled with the over-the-counter pain medications (Tylenol and Motrin or Advil) you might have what we call breakthrough pain. You will receive a prescription for a small amount of an opioid pain medication such as Oxycodone, Tramadol, or Tylenol with Codeine. Use these opioid pills in the first 24 hours after surgery if you have breakthrough pain. Do not take more than 1 pill every 4-6 hours.  If you still have uncontrolled pain after using all opioid pills, don't hesitate to call our staff using the number provided. We will help make sure you are managing your pain in the best way possible, and if necessary, we can provide a  prescription for additional pain medication.   Day 1    Time  Name of Medication Number of pills taken  Amount of Acetaminophen  Pain Level   Comments  AM PM       AM PM       AM PM       AM PM       AM PM       AM PM       AM PM       AM PM       Total Daily amount of Acetaminophen Do not take more than  3,000 mg per day      Day 2    Time  Name of Medication Number of pills taken  Amount of Acetaminophen  Pain Level   Comments  AM PM       AM PM       AM  PM       AM PM       AM PM       AM PM       AM PM       AM PM       Total Daily amount of Acetaminophen Do not take more than  3,000 mg per day      Day 3    Time  Name of Medication Number of pills taken  Amount of Acetaminophen  Pain Level   Comments  AM PM       AM PM       AM PM       AM PM          AM PM       AM PM       AM PM       AM PM       Total Daily amount of Acetaminophen Do not take more than  3,000 mg per day      Day 4    Time  Name of Medication Number of pills taken  Amount of Acetaminophen  Pain Level   Comments  AM PM       AM PM       AM PM       AM PM       AM PM       AM PM       AM PM       AM PM       Total Daily amount of Acetaminophen Do not take more than  3,000 mg per day      Day 5    Time  Name of Medication Number of pills taken  Amount of Acetaminophen  Pain Level   Comments  AM PM       AM PM       AM PM       AM PM       AM PM       AM PM       AM PM       AM PM       Total Daily amount of Acetaminophen Do not take more than  3,000 mg per day       Day 6    Time  Name of Medication Number of pills taken  Amount of Acetaminophen  Pain Level  Comments  AM PM       AM PM       AM PM  AM PM       AM PM       AM PM       AM PM       AM PM       Total Daily amount of Acetaminophen Do not take more than  3,000 mg per day      Day 7    Time  Name of Medication Number of pills taken  Amount of Acetaminophen  Pain Level   Comments  AM PM       AM PM       AM PM       AM PM       AM PM       AM PM       AM PM       AM PM       Total Daily amount of Acetaminophen Do not take more than  3,000 mg per day        For additional information about how and where to safely dispose of unused opioid medications - [employer]  Disclaimer: This document contains information and/or instructional materials adapted from [place] for the typical patient  with your condition. It does not replace medical advice from your health care provider because your experience may differ from that of the typical patient. Talk to your health care provider if you have any questions about this document, your condition or your treatment plan. Adapted from [place] Instructions  Activity: Get plenty of rest for the remainder of the day. A responsible individual must stay with you for 24 hours following the procedure.  For the next 24 hours, DO NOT: -Drive a car -Paediatric nurse -Drink alcoholic beverages -Take any medication unless instructed by your physician -Make any legal decisions or sign important papers.  Meals: Start with liquid foods such as gelatin or soup. Progress to regular foods as tolerated. Avoid greasy, spicy, heavy foods. If nausea and/or vomiting occur, drink only clear liquids until the nausea and/or vomiting subsides. Call your physician if vomiting continues.  Special Instructions/Symptoms: Your throat may feel dry or sore from the anesthesia or the breathing tube placed in your throat during surgery. If this causes discomfort, gargle with warm salt water. The discomfort should disappear within 24 hours.  If you had a scopolamine patch placed behind your ear for the management of post- operative nausea and/or vomiting:  1. The medication in the patch is effective for 72 hours, after which it should be removed.  Wrap patch in a tissue and discard in the trash. Wash hands thoroughly with soap and water. 2. You may remove the patch earlier than 72 hours if you experience unpleasant side effects which may include dry mouth, dizziness or visual disturbances. 3. Avoid touching the patch. Wash your hands with soap and water after contact with the patch.   Post Anesthesia Home Care Instructions  Activity: Get plenty of rest for the remainder of the day. A responsible individual must stay with you for  24 hours following the procedure.  For the next 24 hours, DO NOT: -Drive a car -Paediatric nurse -Drink alcoholic beverages -Take any medication unless instructed by your physician -Make any legal decisions or sign important papers.  Meals: Start with liquid foods such as gelatin or soup. Progress to regular foods as tolerated. Avoid greasy, spicy, heavy foods. If nausea and/or vomiting occur, drink only clear liquids until the nausea and/or vomiting subsides.  Call your physician if vomiting continues.  Special Instructions/Symptoms: Your throat may feel dry or sore from the anesthesia or the breathing tube placed in your throat during surgery. If this causes discomfort, gargle with warm salt water. The discomfort should disappear within 24 hours.  If you had a scopolamine patch placed behind your ear for the management of post- operative nausea and/or vomiting:  1. The medication in the patch is effective for 72 hours, after which it should be removed.  Wrap patch in a tissue and discard in the trash. Wash hands thoroughly with soap and water. 2. You may remove the patch earlier than 72 hours if you experience unpleasant side effects which may include dry mouth, dizziness or visual disturbances. 3. Avoid touching the patch. Wash your hands with soap and water after contact with the patch.

## 2018-10-09 NOTE — Transfer of Care (Signed)
Immediate Anesthesia Transfer of Care Note  Patient: Shannon Quinn  Procedure(s) Performed: RIGHT BREAST LUMPECTOMY WITH RADIOACTIVE SEED LOCALIZATION ERAS PATHWAY (Right Breast)  Patient Location: PACU  Anesthesia Type:General  Level of Consciousness: awake, alert  and oriented  Airway & Oxygen Therapy: Patient Spontanous Breathing and Patient connected to face mask oxygen  Post-op Assessment: Report given to RN and Post -op Vital signs reviewed and stable  Post vital signs: Reviewed and stable  Last Vitals:  Vitals Value Taken Time  BP 156/76 10/09/2018 10:10 AM  Temp    Pulse 79 10/09/2018 10:14 AM  Resp 14 10/09/2018 10:14 AM  SpO2 100 % 10/09/2018 10:14 AM  Vitals shown include unvalidated device data.  Last Pain:  Vitals:   10/09/18 0734  TempSrc: Oral  PainSc: 1          Complications: No apparent anesthesia complications

## 2018-10-09 NOTE — Op Note (Signed)
Patient Name:           Shannon Quinn   Date of Surgery:        10/09/2018  Pre op Diagnosis:     Abnormal mammogram right breast  Post op Diagnosis:    Same  Procedure:                 Right breast lumpectomy with radioactive seed localization and margin assessment  Surgeon:                     Edsel Petrin. Dalbert Batman, M.D., FACS  Assistant:                      Or staff  Operative Indications:   The patient is a 48 year old female who presents with a breast mass. This is a very pleasant 48 year old female. PCP is Oley Balm. Oncologist is [employer].     She is a melanoma patient of mine.     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 She had some residual melanoma in the back but the margins were widely negative. One out of 10 lymph nodes was positive and it was on the right side, 0.1 mm micrometastasis. No further surgery was felt to be warranted according to current guidelines and melanoma conference. PET/CT was negative on May 13, 2018. She suffered fungal dermatitis which ultimately resolved and she feels fine now.      Mammograms and axillary ultrasound were performed for screening on September 11, 2018. There was a small area of distortion within the upper right breast and a 1 cm right axillary lymph node. The left axilla looked normal. Biopsy of the right axillary lymph node was negative, benign lymphatic tissue, felt to be concordant. Biopsy of the right breast central superior showed benign breast parenchyma and mild fibrocystic change. This was felt to be discordant with excision recommended by Dr. Lovey Newcomer.      Exam reveals no breast mass. Axillary exam was normal I told her that the risks of upgrade to cancer with excision was probably 5% maybe a little bit higher but no more. Most likely she does not have cancer. She doesn't want to take a chance and so we are going to schedule  her for right breast lumpectomy with radioactive seed localization.   Operative Findings:       The radioactive seed and the marker clip were very close to one another in the 12 o'clock position of the right breast.  The breast tissue felt normal.  Specimen mammogram showed both the clip and the seed and the specimen.  Procedure in Detail:          Following the induction of general endotracheal anesthesia the patient's right breast was prepped and draped in sterile fashion.  Intravenous antibiotics were given.  Surgical timeout was performed.  0.25% Marcaine with epinephrine was used as a local infiltration anesthetic.      Using the neoprobe I isolated the radioactive signal.  I designed a transverse curvilinear incision in the skin lines in the superior right breast.  The incision was made.  The lumpectomy was performed using electrocautery and the neoprobe.  The specimen was removed and marked with a 6 color ink kit and a silk sutures.  The specimen mammogram looked good as described above.  The specimen was sent to the lab where  the seed was retrieved.  Hemostasis was excellent.  The wound was irrigated.  I placed 4 or 5 metal marker clips in the walls of the lumpectomy cavity.  The lumpectomy cavity was closed in layers with interrupted 3-0 Vicryl and the skin closed with a running subcuticular 4-0 Monocryl and Dermabond.  Breast binder was placed and the patient taken to PACU in stable condition.  EBL 10 cc.  Counts correct.  Complications none.   Addendum: I logged onto the PMP aware website and reviewed her prescription medication history     Tiera Mensinger M. Dalbert Batman, M.D., FACS General and Minimally Invasive Surgery Breast and Colorectal Surgery  10/09/2018 10:04 AM

## 2018-10-09 NOTE — Anesthesia Postprocedure Evaluation (Signed)
Anesthesia Post Note  Patient: Shannon Quinn  Procedure(s) Performed: RIGHT BREAST LUMPECTOMY WITH RADIOACTIVE SEED LOCALIZATION ERAS PATHWAY (Right Breast)     Patient location during evaluation: PACU Anesthesia Type: General Level of consciousness: awake and alert Pain management: pain level controlled Vital Signs Assessment: post-procedure vital signs reviewed and stable Respiratory status: spontaneous breathing, nonlabored ventilation, respiratory function stable and patient connected to nasal cannula oxygen Cardiovascular status: blood pressure returned to baseline and stable Postop Assessment: no apparent nausea or vomiting Anesthetic complications: no    Last Vitals:  Vitals:   10/09/18 1210 10/09/18 1221  BP:  (!) 154/86  Pulse:  60  Resp:  18  Temp: 36.6 C 36.6 C  SpO2:  97%    Last Pain:  Vitals:   10/09/18 1221  TempSrc:   PainSc: 4                  Savannha Welle COKER

## 2018-10-09 NOTE — Anesthesia Preprocedure Evaluation (Signed)
Anesthesia Evaluation  Patient identified by MRN, date of birth, ID band Patient awake    Reviewed: Allergy & Precautions, NPO status , Patient's Chart, lab work & pertinent test results  Airway Mallampati: II  TM Distance: >3 FB Neck ROM: Full    Dental  (+) Teeth Intact, Dental Advisory Given   Pulmonary    breath sounds clear to auscultation       Cardiovascular  Rhythm:Regular Rate:Normal     Neuro/Psych    GI/Hepatic   Endo/Other    Renal/GU      Musculoskeletal   Abdominal   Peds  Hematology   Anesthesia Other Findings   Reproductive/Obstetrics                             Anesthesia Physical Anesthesia Plan  ASA: II  Anesthesia Plan: General   Post-op Pain Management:    Induction: Intravenous  PONV Risk Score and Plan: 1 and Ondansetron, Dexamethasone and Scopolamine patch - Pre-op  Airway Management Planned: LMA  Additional Equipment:   Intra-op Plan:   Post-operative Plan:   Informed Consent: I have reviewed the patients History and Physical, chart, labs and discussed the procedure including the risks, benefits and alternatives for the proposed anesthesia with the patient or authorized representative who has indicated his/her understanding and acceptance.     Dental advisory given  Plan Discussed with: CRNA and Anesthesiologist  Anesthesia Plan Comments:         Anesthesia Quick Evaluation

## 2018-10-09 NOTE — Anesthesia Procedure Notes (Signed)
Procedure Name: Intubation Performed by: Verita Lamb, CRNA Pre-anesthesia Checklist: Patient identified, Emergency Drugs available, Suction available, Patient being monitored and Timeout performed Patient Re-evaluated:Patient Re-evaluated prior to induction Oxygen Delivery Method: Circle system utilized Preoxygenation: Pre-oxygenation with 100% oxygen Induction Type: IV induction Laryngoscope Size: Mac and 4 Grade View: Grade I Tube type: Oral Tube size: 7.0 mm Number of attempts: 1 Airway Equipment and Method: Stylet Placement Confirmation: ETT inserted through vocal cords under direct vision,  positive ETCO2,  CO2 detector and breath sounds checked- equal and bilateral Secured at: 23 cm Tube secured with: Tape Dental Injury: Teeth and Oropharynx as per pre-operative assessment  Comments: Pt had clear fluid coming out around lma, lma removed and pt suctioned, immediately did rsi induction. Quickly intubated pt with grade I view mac 4 blade, suctioned ett and decompressed stomach with ogt.  50 ml of clear fluid from ogt, appears to be water.  Pt had denied symptoms of reflux in holding room. mkelly

## 2018-10-09 NOTE — Anesthesia Procedure Notes (Signed)
Procedure Name: LMA Insertion Performed by: Verita Lamb, CRNA Pre-anesthesia Checklist: Patient identified, Emergency Drugs available, Suction available, Patient being monitored and Timeout performed Patient Re-evaluated:Patient Re-evaluated prior to induction Oxygen Delivery Method: Circle system utilized Preoxygenation: Pre-oxygenation with 100% oxygen Induction Type: IV induction LMA: LMA inserted LMA Size: 4.0 Placement Confirmation: positive ETCO2,  breath sounds checked- equal and bilateral and CO2 detector Comments: lma inserted easily after smooth iv induction, bbs.  Then noted clear fluid coming out pt's mouth around the lma, immediately removed and suctioned. sats remained 99 percent.

## 2018-10-09 NOTE — Interval H&P Note (Signed)
History and Physical Interval Note:  10/09/2018 7:34 AM  Shannon Quinn  has presented today for surgery, with the diagnosis of right breast mass  The various methods of treatment have been discussed with the patient and family. After consideration of risks, benefits and other options for treatment, the patient has consented to  Procedure(s): RIGHT BREAST LUMPECTOMY WITH RADIOACTIVE SEED LOCALIZATION ERAS PATHWAY (Right) as a surgical intervention .  The patient's history has been reviewed, patient examined, no change in status, stable for surgery.  I have reviewed the patient's chart and labs.  Questions were answered to the patient's satisfaction.     Adin Hector

## 2018-10-12 ENCOUNTER — Encounter (HOSPITAL_BASED_OUTPATIENT_CLINIC_OR_DEPARTMENT_OTHER): Payer: Self-pay | Admitting: General Surgery

## 2018-10-12 NOTE — Progress Notes (Signed)
Inform patient of Pathology report,. Breast tissue pathology shows benign findings.  No cancer or atypia This is excellent news I will discuss this with her at her next office visit Let  me know that you spoke to her   hmi

## 2018-11-13 ENCOUNTER — Telehealth: Payer: Self-pay

## 2018-11-13 NOTE — Telephone Encounter (Signed)
-----   Message from Wyatt Portela, MD sent at 11/13/2018  4:16 PM EDT ----- Regarding: RE: dermatologist recommendation [facility]. She can call for an appointment.  ----- Message ----- From: Scot Dock, RN Sent: 11/13/2018   4:02 PM EDT To: Wyatt Portela, MD Subject: dermatologist recommendation                   Patient would like to know a recommended dermatology office to follow regarding the melanoma.

## 2018-11-13 NOTE — Telephone Encounter (Signed)
Called patient back and provided the recommendation for [facility] and explained that she can call them to schedule a new patient appointment.

## 2018-11-24 ENCOUNTER — Other Ambulatory Visit: Payer: Self-pay

## 2018-11-24 ENCOUNTER — Ambulatory Visit (INDEPENDENT_AMBULATORY_CARE_PROVIDER_SITE_OTHER): Payer: BLUE CROSS/BLUE SHIELD | Admitting: Family Medicine

## 2018-11-24 ENCOUNTER — Encounter: Payer: Self-pay | Admitting: Family Medicine

## 2018-11-24 VITALS — Temp 99.5°F | Ht 64.5 in

## 2018-11-24 DIAGNOSIS — N951 Menopausal and female climacteric states: Secondary | ICD-10-CM

## 2018-11-24 DIAGNOSIS — H109 Unspecified conjunctivitis: Secondary | ICD-10-CM

## 2018-11-24 DIAGNOSIS — B9689 Other specified bacterial agents as the cause of diseases classified elsewhere: Secondary | ICD-10-CM

## 2018-11-24 MED ORDER — VENLAFAXINE HCL ER 37.5 MG PO CP24: ORAL_CAPSULE | ORAL | 0 refills | Status: DC

## 2018-11-24 MED ORDER — OFLOXACIN 0.3 % OP SOLN: 2.0000 [drp] | Freq: Four times a day (QID) | OPHTHALMIC | 0 refills | Status: DC

## 2018-11-24 NOTE — Progress Notes (Signed)
Virtual Visit via Video   I connected with Shannon Quinn on 11/24/18 at  9:30 AM EDT by a video enabled telemedicine application and verified that I am speaking with the correct person using two identifiers. Location patient: Home Location provider: [facility], Office Persons participating in the virtual visit: Patient, Dr. Raoul Pitch and R.Baker, LPN  I discussed the limitations of evaluation and management by telemedicine and the availability of in person appointments. The patient expressed understanding and agreed to proceed.  Subjective:   Chief Complaint  Patient presents with  . Conjunctivitis    Pink eye in both eyes with itching, burning, and closed shut this AM     HPI:  Patient reports itchy red eyes that started right eye 3 days ago and this morning symptoms have progressed to her left eye. She reports this morning her eyes were matted shut and required warm water to open.  Denies fever, chills, nausea, vomit or upper respiratory symptoms. She is not a contact lens wear.  Does have a history of allergic rhinitis and is currently taking Flonase nasal spray and Zyrtec daily. She has not tried any over-the-counter eyedrops.  She has not been exposed to anybody with conjunctivitis that she is aware of.  Hot flashes: Patient reports she was on a hormone patch and was recently taken off of this patch secondary to urine irregularity found in her mammogram.  She reports since that time she is having greater than 20 hot flashes a day including nighttime sweats.  She would like to try something nonhormonal to help with her hot flashes.  ROS: See pertinent positives and negatives per HPI.  Patient Active Problem List   Diagnosis Date Noted  . Abnormality of right breast on screening mammogram 10/09/2018  . Melanoma of skin ([place]) 03/26/2018  . Eustachian tube dysfunction, bilateral 01/29/2017  . Right cervical radiculopathy 11/08/2015  . Anxiety and depression 03/26/2013  .  Varicose veins 12/01/2012  . Hypothyroid 05/25/2012    Social History   Tobacco Use  . Smoking status: Never Smoker  . Smokeless tobacco: Never Used  Substance Use Topics  . Alcohol use: Yes    Alcohol/week: 0.0 standard drinks    Comment: occasional    Current Outpatient Medications:  .  aspirin-acetaminophen-caffeine (EXCEDRIN MIGRAINE) 250-250-65 MG tablet, Take 2 tablets by mouth every 6 (six) hours as needed for headache., Disp: , Rfl:  .  cetirizine (ZYRTEC) 10 MG tablet, Take 10 mg by mouth daily., Disp: , Rfl:  .  fluticasone (FLONASE) 50 MCG/ACT nasal spray, Place 2 sprays into both nostrils daily., Disp: 16 g, Rfl: 11 .  ibuprofen (ADVIL,MOTRIN) 200 MG tablet, Take 400 mg by mouth every 6 (six) hours as needed., Disp: , Rfl:  .  Misc Natural Products (T-RELIEF CBD+13 SL), Place under the tongue., Disp: , Rfl:  .  traMADol (ULTRAM) 50 MG tablet, Take 50 mg by mouth every 6 (six) hours as needed., Disp: , Rfl:  .  traMADol (ULTRAM) 50 MG tablet, Take 1-2 tablets (50-100 mg total) by mouth every 6 (six) hours as needed for severe pain., Disp: 20 tablet, Rfl: 0 .  triamcinolone (KENALOG) 0.025 % cream, Apply 1 application topically 2 (two) times daily., Disp: , Rfl:   Allergies  Allergen Reactions  . Chlorhexidine Other (See Comments)    Red and purple burns and peeled skin Pt states Betadine is safe and without reaction.  Marland Kitchen Neomycin     UNSPECIFIED REACTION   . Sulfasalazine  Other (See Comments)  . Sulfonamide Derivatives     UNSPECIFIED REACTION   . Augmentin [Amoxicillin-Pot Clavulanate] Nausea And Vomiting    amox ok.   . Hydrocodone Nausea And Vomiting    Severe vomiting  . Oxycodone Nausea And Vomiting    Severe vomiting  . Prednisone Other (See Comments)    insomnia    Objective:  LMP 08/23/2017  Gen: No acute distress. Nontoxic in appearance.  HENT: AT. [place].  MMM.  Eyes:Pupils Equal Round Reactive to light, Extraocular movements intact,  Conjunctiva with  mild redness, mild watery discharge.  Medial canthus mildly reddened swollen bilaterally. Chest: Cough or shortness of breath not present Skin: no hes, purpura or petechiae.  Neuro:  Alert. Oriented x3  Psych: Normal affect, dress and demeanor. Normal speech. Normal thought content and judgment.   Assessment and Plan:  Shannon Quinn is a 48 y.o. female Hot flashes due to menopause -New problem. -Discussed options with her today and she would like to try Effexor for her hot flashes.  She is aware that this has to be taken daily, around the same time a day, tapered on and if desired in the future tapered off. -start  Effexor taper with instructions. -If still taking [place]. John's wort she is to stop use. -Follow-up in 3.5 weeks.  Bacterial conjunctivitis of both eyes -New problem -Discussed the possibilities of allergy causing her symptoms.  However it did start in her right eye and then proceed to her left eye along with being matted shut would be consistent with bacterial conjunctivitis. -Warm compresses twice daily -Education on bacterial and allergic conjunctivitis.  If felt to be more allergic in nature, start Pataday OTC. - ofloxacin  eyedrops twice daily 5 to 7 days -Follow up as needed  > 25 minutes spent with patient, >50% of time spent face to face counseling     Howard Pouch, DO 11/24/2018

## 2018-11-24 NOTE — Patient Instructions (Signed)
ofloxacin drops 1 to 2 drops each eye daily for 7 days. If eyes are still continuing to itch after treatment, consider buying over-the-counter Pataday drops- eyedrops for allergies.  Hot flashes we have prescribed Effexor.  37.5 mg in the morning for 7 days, on day take 37.5 mg tabs equaling 75 mg total dose. Follow-up in 3.5 weeks.   Bacterial Conjunctivitis, Adult Bacterial conjunctivitis is an infection of your conjunctiva. This is the clear membrane that covers the white part of your eye and the inner part of your eyelid. This infection can make your eye:  Red or pink.  Itchy. This condition spreads easily from person to person (is contagious) and from one eye to the other eye. What are the causes?  This condition is caused by germs (bacteria). You may get the infection if you come into close contact with: ? A person who has the infection. ? Items that have germs on them (are contaminated), such as face towels, contact lens solution, or eye makeup. What increases the risk? You are more likely to get this condition if you:  Have contact with people who have the infection.  Wear contact lenses.  Have a sinus infection.  Have had a recent eye injury or surgery.  Have a weak body defense system (immune system).  Have dry eyes. What are the signs or symptoms?   Thick, yellowish discharge from the eye.  Tearing or watery eyes.  Itchy eyes.  Burning feeling in your eyes.  Eye redness.  Swollen eyelids.  Blurred vision. How is this treated?   Antibiotic eye drops or ointment.  Antibiotic medicine taken by mouth. This is used for infections that do not get better with drops or ointment or that last more than 10 days.  Cool, wet cloths placed on the eyes.  Artificial tears used 2-6 times a day. Follow these instructions at home: Medicines  Take or apply your antibiotic medicine as told by your doctor. Do not stop taking or applying the antibiotic even if you  start to feel better.  Take or apply over-the-counter and prescription medicines only as told by your doctor.  Do not touch your eyelid with the eye-drop bottle or the ointment tube. Managing discomfort  Wipe any fluid from your eye with a warm, wet washcloth or a cotton ball.  Place a clean, cool, wet cloth on your eye. Do this for 10-20 minutes, 3-4 times per day. General instructions  Do not wear contacts until the infection is gone. Wear glasses until your doctor says it is okay to wear contacts again.  Do not wear eye makeup until the infection is gone. Throw away old eye makeup.  Change or wash your pillowcase every day.  Do not share towels or washcloths.  Wash your hands often with soap and water. Use paper towels to dry your hands.  Do not touch or rub your eyes.  Do not drive or use heavy machinery if your vision is blurred. Contact a doctor if:  You have a fever.  You do not get better after 10 days. Get help right away if:  You have a fever and your symptoms get worse all of a sudden.  You have very bad pain when you move your eye.  Your face: ? Hurts. ? Is red. ? Is swollen.  You have sudden loss of vision. Summary  Bacterial conjunctivitis is an infection of your conjunctiva.  This infection spreads easily from person to person.  Wash your hands often  with soap and water. Use paper towels to dry your hands.  Take or apply your antibiotic medicine as told by your doctor.  Contact a doctor if you have a fever or you do not get better after 10 days. This information is not intended to replace advice given to you by your health care provider. Make sure you discuss any questions you have with your health care provider. Document Released: 05/07/2008 Document Revised: 03/04/2018 Document Reviewed: 03/04/2018 Elsevier Interactive Patient Education  2019 [employer].   Allergic Conjunctivitis A clear membrane (conjunctiva) covers the white part of  your eye and the inner surface of your eyelid. Allergic conjunctivitis happens when this membrane has inflammation. This is caused by allergies. Common causes of allergic reactions (allergens)include:  Outdoor allergens, such as: ? Pollen. ? Grass and weeds. ? Mold spores.  Indoor allergens, such as: ? Dust. ? Smoke. ? Mold. ? Pet dander. ? Animal hair. This condition can make your eye red or pink. It can also make your eye feel itchy. This condition cannot be spread from one person to another person (is not contagious). Follow these instructions at home:  Try not to be around things that you are allergic to.  Take or apply over-the-counter and prescription medicines only as told by your doctor. These include any eye drops.  Place a cool, clean washcloth on your eye for 10-20 minutes. Do this 3-4 times a day.  Do not touch or rub your eyes.  Do not wear contact lenses until the inflammation is gone. Wear glasses instead.  Do not wear eye makeup until the inflammation is gone.  Keep all follow-up visits as told by your doctor. This is important. Contact a doctor if:  Your symptoms get worse.  Your symptoms do not get better with treatment.  You have mild eye pain.  You are sensitive to light,  You have spots or blisters on your eyes.  You have pus coming from your eye.  You have a fever. Get help right away if:  You have redness, swelling, or other symptoms in only one eye.  Your vision is blurry.  You have vision changes.  You have very bad eye pain. Summary  Allergic conjunctivitis is caused by allergies. It can make your eye red or pink, and it can make your eye feel itchy.  This condition cannot be spread from one person to another person (is not contagious).  Try not to be around things that you are allergic to.  Take or apply over-the-counter and prescription medicines only as told by your doctor. These include any eye drops.  Contact your doctor if  your symptoms get worse or they do not get better with treatment. This information is not intended to replace advice given to you by your health care provider. Make sure you discuss any questions you have with your health care provider. Document Released: 01/16/2010 Document Revised: 03/22/2016 Document Reviewed: 03/22/2016 Elsevier Interactive Patient Education  2019 [employer].   Menopause Menopause is the normal time of life when menstrual periods stop completely. It is usually confirmed by 12 months without a menstrual period. The transition to menopause (perimenopause) most often happens between the ages of 103 and 75. During perimenopause, hormone levels change in your body, which can cause symptoms and affect your health. Menopause may increase your risk for:  Loss of bone (osteoporosis), which causes bone breaks (fractures).  Depression.  Hardening and narrowing of the arteries (atherosclerosis), which can cause heart attacks and  strokes. What are the causes? This condition is usually caused by a natural change in hormone levels that happens as you get older. The condition may also be caused by surgery to remove both ovaries (bilateral oophorectomy). What increases the risk? This condition is more likely to start at an earlier age if you have certain medical conditions or treatments, including:  A tumor of the pituitary gland in the brain.  A disease that affects the ovaries and hormone production.  Radiation treatment for cancer.  Certain cancer treatments, such as chemotherapy or hormone (anti-estrogen) therapy.  Heavy smoking and excessive alcohol use.  Family history of early menopause. This condition is also more likely to develop earlier in women who are very thin. What are the signs or symptoms? Symptoms of this condition include:  Hot flashes.  Irregular menstrual periods.  Night sweats.  Changes in feelings about sex. This could be a decrease in sex drive or  an increased comfort around your sexuality.  Vaginal dryness and thinning of the vaginal walls. This may cause painful intercourse.  Dryness of the skin and development of wrinkles.  Headaches.  Problems sleeping (insomnia).  Mood swings or irritability.  Memory problems.  Weight gain.  Hair growth on the face and chest.  Bladder infections or problems with urinating. How is this diagnosed? This condition is diagnosed based on your medical history, a physical exam, your age, your menstrual history, and your symptoms. Hormone tests may also be done. How is this treated? In some cases, no treatment is needed. You and your health care provider should make a decision together about whether treatment is necessary. Treatment will be based on your individual condition and preferences. Treatment for this condition focuses on managing symptoms. Treatment may include:  Menopausal hormone therapy (MHT).  Medicines to treat specific symptoms or complications.  Acupuncture.  Vitamin or herbal supplements. Before starting treatment, make sure to let your health care provider know if you have a personal or family history of:  Heart disease.  Breast cancer.  Blood clots.  Diabetes.  Osteoporosis. Follow these instructions at home: Lifestyle  Do not use any products that contain nicotine or tobacco, such as cigarettes and e-cigarettes. If you need help quitting, ask your health care provider.  Get at least 30 minutes of physical activity on 5 or more days each week.  Avoid alcoholic and caffeinated beverages, as well as spicy foods. This may help prevent hot flashes.  Get 7-8 hours of sleep each night.  If you have hot flashes, try: ? Dressing in layers. ? Avoiding things that may trigger hot flashes, such as spicy food, warm places, or stress. ? Taking slow, deep breaths when a hot flash starts. ? Keeping a fan in your home and office.  Find ways to manage stress, such as  deep breathing, meditation, or journaling.  Consider going to group therapy with other women who are having menopause symptoms. Ask your health care provider about recommended group therapy meetings. Eating and drinking  Eat a healthy, balanced diet that contains whole grains, lean protein, low-fat dairy, and plenty of fruits and vegetables.  Your health care provider may recommend adding more soy to your diet. Foods that contain soy include tofu, tempeh, and soy milk.  Eat plenty of foods that contain calcium and vitamin D for bone health. Items that are rich in calcium include low-fat milk, yogurt, beans, almonds, sardines, broccoli, and kale. Medicines  Take over-the-counter and prescription medicines only as told by your  health care provider.  Talk with your health care provider before starting any herbal supplements. If prescribed, take vitamins and supplements as told by your health care provider. These may include: ? Calcium. Women age 28 and older should get 1,200 mg (milligrams) of calcium every day. ? Vitamin D. Women need 600-800 International Units of vitamin D each day. ? Vitamins B12 and B6. Aim for 50 micrograms of B12 and 1.5 mg of B6 each day. General instructions  Keep track of your menstrual periods, including: ? When they occur. ? How heavy they are and how long they last. ? How much time passes between periods.  Keep track of your symptoms, noting when they start, how often you have them, and how long they last.  Use vaginal lubricants or moisturizers to help with vaginal dryness and improve comfort during sex.  Keep all follow-up visits as told by your health care provider. This is important. This includes any group therapy or counseling. Contact a health care provider if:  You are still having menstrual periods after age 69.  You have pain during sex.  You have not had a period for 12 months and you develop vaginal bleeding. Get help right away if:  You  have: ? Severe depression. ? Excessive vaginal bleeding. ? Pain when you urinate. ? A fast or irregular heart beat (palpitations). ? Severe headaches. ? Abdomen (abdominal) pain or severe indigestion.  You fell and you think you have a broken bone.  You develop leg or chest pain.  You develop vision problems.  You feel a lump in your breast. Summary  Menopause is the normal time of life when menstrual periods stop completely. It is usually confirmed by 12 months without a menstrual period.  The transition to menopause (perimenopause) most often happens between the ages of 74 and 28.  Symptoms can be managed through medicines, lifestyle changes, and complementary therapies such as acupuncture.  Eat a balanced diet that is rich in nutrients to promote bone health and heart health and to manage symptoms during menopause. This information is not intended to replace advice given to you by your health care provider. Make sure you discuss any questions you have with your health care provider. Document Released: 10/19/2003 Document Revised: 08/31/2016 Document Reviewed: 08/31/2016 Elsevier Interactive Patient Education  2019 [employer].

## 2018-12-16 ENCOUNTER — Ambulatory Visit (INDEPENDENT_AMBULATORY_CARE_PROVIDER_SITE_OTHER): Payer: BLUE CROSS/BLUE SHIELD | Admitting: Family Medicine

## 2018-12-16 ENCOUNTER — Encounter: Payer: Self-pay | Admitting: Family Medicine

## 2018-12-16 ENCOUNTER — Other Ambulatory Visit: Payer: Self-pay | Admitting: Family Medicine

## 2018-12-16 ENCOUNTER — Other Ambulatory Visit: Payer: Self-pay

## 2018-12-16 VITALS — Ht 64.5 in

## 2018-12-16 DIAGNOSIS — N951 Menopausal and female climacteric states: Secondary | ICD-10-CM | POA: Diagnosis not present

## 2018-12-16 MED ORDER — VENLAFAXINE HCL ER 75 MG PO CP24: 75.0000 mg | ORAL_CAPSULE | Freq: Every day | ORAL | 1 refills | Status: DC

## 2018-12-16 NOTE — Progress Notes (Signed)
VIRTUAL VISIT VIA VIDEO  I connected with [name] on 12/16/18 at 10:45 AM EDT by a video enabled telemedicine application and verified that I am speaking with the correct person using two identifiers. Location patient: Home Location provider: [facility], Office Persons participating in the virtual visit: Patient, Dr. Raoul Pitch and R.Baker, LPN  I discussed the limitations of evaluation and management by telemedicine and the availability of in person appointments. The patient expressed understanding and agreed to proceed.   SUBJECTIVE Chief Complaint  Patient presents with  . Follow-up    Pt states she loves the medication and it is working well but she is very Nauseated. She started taking at night and it is not as bad.     HPI:  Hot flashes: Patient reports she was on a hormone patch and was recently taken off of this patch secondary to urine irregularity found in her mammogram.  She reports since that time she was having greater than 20 hot flashes a day including nighttime sweats.    SHe was started on Effexor taper 3 and half weeks ago and has tolerated the medicine well.  She states her hot flashes have completely resolved.  She will have an occasional flush of her face but that is it.  She did have some mild nausea with the start of Effexor.  She started taking the medicine at night and has helped some amount but still having some mild nausea. ROS: See pertinent positives and negatives per HPI.  Patient Active Problem List   Diagnosis Date Noted  . Abnormality of right breast on screening mammogram 10/09/2018  . Impingement syndrome of right shoulder region 06/04/2018  . 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 Melanoma of skin ([place]) 03/26/2018  . Nasal turbinate hypertrophy 04/15/2017  . Eustachian tube dysfunction, bilateral 01/29/2017  . Right cervical radiculopathy 11/08/2015  . Anxiety and depression 03/26/2013  . Varicose veins 12/01/2012  .  Hypothyroid 05/25/2012    Social History   Tobacco Use  . Smoking status: Never Smoker  . Smokeless tobacco: Never Used  Substance Use Topics  . Alcohol use: Yes    Alcohol/week: 0.0 standard drinks    Comment: occasional    Current Outpatient Medications:  .  aspirin-acetaminophen-caffeine (EXCEDRIN MIGRAINE) 250-250-65 MG tablet, Take 2 tablets by mouth every 6 (six) hours as needed for headache., Disp: , Rfl:  .  cetirizine (ZYRTEC) 10 MG tablet, Take 10 mg by mouth daily., Disp: , Rfl:  .  fluticasone (FLONASE) 50 MCG/ACT nasal spray, Place 2 sprays into both nostrils daily., Disp: 16 g, Rfl: 11 .  ibuprofen (ADVIL,MOTRIN) 200 MG tablet, Take 400 mg by mouth every 6 (six) hours as needed., Disp: , Rfl:  .  triamcinolone (KENALOG) 0.025 % cream, Apply 1 application topically 2 (two) times daily., Disp: , Rfl:  .  venlafaxine XR (EFFEXOR-XR) 37.5 MG 24 hr capsule, 1 capsule (37.5 mg) PO in the morning for 7 days, then 2 capsules (75 mg) PO in the morning, Disp: 53 capsule, Rfl: 0 .  ofloxacin (OCUFLOX) 0.3 % ophthalmic solution, Place 2 drops into both eyes 4 (four) times daily. (Patient not taking: Reported on 12/16/2018), Disp: 5 mL, Rfl: 0  Allergies  Allergen Reactions  . Chlorhexidine Other (See Comments)    Red and purple burns and peeled skin Pt states Betadine is safe and without reaction.  Marland Kitchen Neomycin     UNSPECIFIED REACTION   . Sulfasalazine Other (See Comments)  . Sulfonamide  Derivatives     UNSPECIFIED REACTION   . Augmentin [Amoxicillin-Pot Clavulanate] Nausea And Vomiting    amox ok.   . Hydrocodone Nausea And Vomiting    Severe vomiting  . Oxycodone Nausea And Vomiting    Severe vomiting  . Prednisone Other (See Comments)    insomnia    OBJECTIVE: Ht 5' 4.5" (1.638 m)   LMP 08/23/2017   BMI 37.52 kg/m  Gen: No acute distress. Nontoxic in appearance.  HENT: AT. [place].  MMM.  Eyes:Pupils Equal Round Reactive to light, Extraocular movements intact,   Conjunctiva without redness, discharge or icterus. Chest: Cough or shortness of breath.  Skin: no rashes, purpura or petechiae.  Neuro:  Alert. Oriented x3  Psych: Normal affect, dress and demeanor. Normal speech. Normal thought content and judgment.  ASSESSMENT AND PLAN: Shannon Quinn is a 48 y.o. female present for  Hot flashes due to menopause - mild nausea with med, otherwise doing very well. It has stopped her hot flashes all together.  -Continue Effexor 75 mg daily.  Refills provided for her today.  She was reminded this is a 75 g tab to only take 1 tab per day.  She reports understanding -Follow-up 6 mos.   > 15 minutes spent with patient, >50% of time spent face to face counseling      Howard Pouch, DO 12/16/2018

## 2018-12-25 ENCOUNTER — Inpatient Hospital Stay: Payer: BLUE CROSS/BLUE SHIELD

## 2018-12-25 ENCOUNTER — Other Ambulatory Visit: Payer: BLUE CROSS/BLUE SHIELD

## 2018-12-25 ENCOUNTER — Inpatient Hospital Stay: Payer: BLUE CROSS/BLUE SHIELD | Attending: Oncology | Admitting: Oncology

## 2018-12-25 ENCOUNTER — Other Ambulatory Visit: Payer: Self-pay

## 2018-12-25 VITALS — BP 136/81 | HR 89 | Temp 98.0°F | Resp 18 | Ht 64.5 in | Wt 227.0 lb

## 2018-12-25 DIAGNOSIS — Z85820 Personal history of malignant melanoma of skin: Secondary | ICD-10-CM | POA: Diagnosis not present

## 2018-12-25 DIAGNOSIS — C439 Malignant melanoma of skin, unspecified: Secondary | ICD-10-CM

## 2018-12-25 LAB — CMP (CANCER CENTER ONLY)
ALT: 57 U/L — ABNORMAL HIGH (ref 0–44)
AST: 34 U/L (ref 15–41)
Albumin: 3.9 g/dL (ref 3.5–5.0)
Alkaline Phosphatase: 84 U/L (ref 38–126)
Anion gap: 9 (ref 5–15)
BUN: 14 mg/dL (ref 6–20)
CO2: 28 mmol/L (ref 22–32)
Calcium: 9.6 mg/dL (ref 8.9–10.3)
Chloride: 106 mmol/L (ref 98–111)
Creatinine: 0.97 mg/dL (ref 0.44–1.00)
GFR, Est AFR Am: >60 mL/min (ref >60)
GFR, Estimated: >60 mL/min (ref >60)
Glucose, Bld: 89 mg/dL (ref 70–99)
Potassium: 3.7 mmol/L (ref 3.5–5.1)
Sodium: 143 mmol/L (ref 135–145)
Total Bilirubin: 0.2 mg/dL — ABNORMAL LOW (ref 0.3–1.2)
Total Protein: 7.6 g/dL (ref 6.5–8.1)

## 2018-12-25 LAB — LACTATE DEHYDROGENASE: LDH: 211 U/L — ABNORMAL HIGH (ref 98–192)

## 2018-12-25 LAB — CBC WITH DIFFERENTIAL (CANCER CENTER ONLY)
Abs Immature Granulocytes: 0.02 10*3/uL (ref 0.00–0.07)
Basophils Absolute: 0.1 10*3/uL (ref 0.0–0.1)
Basophils Relative: 1 %
Eosinophils Absolute: 0.2 10*3/uL (ref 0.0–0.5)
Eosinophils Relative: 2 %
HCT: 43.2 % (ref 36.0–46.0)
Hemoglobin: 13.6 g/dL (ref 12.0–15.0)
Immature Granulocytes: 0 %
Lymphocytes Relative: 31 %
Lymphs Abs: 2.5 10*3/uL (ref 0.7–4.0)
MCH: 29.3 pg (ref 26.0–34.0)
MCHC: 31.5 g/dL (ref 30.0–36.0)
MCV: 93.1 fL (ref 80.0–100.0)
Monocytes Absolute: 0.6 10*3/uL (ref 0.1–1.0)
Monocytes Relative: 7 %
Neutro Abs: 4.8 10*3/uL (ref 1.7–7.7)
Neutrophils Relative %: 59 %
Platelet Count: 280 10*3/uL (ref 150–400)
RBC: 4.64 MIL/uL (ref 3.87–5.11)
RDW: 13.5 % (ref 11.5–15.5)
WBC Count: 8.2 10*3/uL (ref 4.0–10.5)
nRBC: 0 % (ref 0.0–0.2)

## 2018-12-25 NOTE — Progress Notes (Signed)
Hematology and Oncology Follow Up Visit  Shannon Quinn [id number] Jul 08, 1971 48 y.o. 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  Her tumor was noted to have 1.1 mm depth with 1 out of 10 lymph node involved with 0.1 mm involvement in the lymph node.   Prior Therapy: She underwent wide excision with bilateral sentinel lymph node biopsy of the axilla.  The final pathology obtained on April 02, 2018 showed a residual melanoma of 0.8 mm T staging was T2a.  She had bilateral lymph node sentinel lymph node biopsy with 1 out of 10 lymph nodes were positive for melanoma.  Less than 0.1 mm tiny focus of melanoma seen on 1 of those 10 lymph nodes.   Current therapy: Active surveillance.  Interim History: Shannon Quinn returns today for a repeat evaluation.  Since the last visit, she reports no major changes in her health.  She had a mammogram in January 2020 which showed a suspicious right axillary lymph node that was confirmed by ultrasound.  Biopsy obtained at that time showed no evidence of malignancy.  She subsequently underwent right breast lumpectomy under the care of Dr. Dalbert Batman as well on October 09, 2018 which showed no evidence of disease or malignancy.  She has recovered well from surgery without any recent complaints.  She did notice change in the color of a mole on her back without any masses or lesions.  She denies any lymphadenopathy or constitutional symptoms.  She denied any alteration mental status, neuropathy, confusion or dizziness.  Denies any headaches or lethargy.  Denies any night sweats, weight loss or changes in appetite.  Denied orthopnea, dyspnea on exertion or chest discomfort.  Denies shortness of breath, difficulty breathing hemoptysis or cough.  Denies any abdominal distention, nausea, early satiety or dyspepsia.   Denies any hematuria, frequency, dysuria or nocturia.  Denies any skin irritation, dryness or rash.  Denies any ecchymosis or petechiae.  Denies any lymphadenopathy or clotting.  Denies any heat or cold intolerance.  Denies any anxiety or depression.  Remaining review of system is negative.       Medications: I have reviewed the patient's current medications.  Current Outpatient Medications  Medication Sig Dispense Refill  . cetirizine (ZYRTEC) 10 MG tablet Take 10 mg by mouth daily.    . fluticasone (FLONASE) 50 MCG/ACT nasal spray Place 2 sprays into both nostrils daily. 16 g 11  . venlafaxine XR (EFFEXOR-XR) 75 MG 24 hr capsule Take 1 capsule (75 mg total) by mouth daily. 90 capsule 1   No current facility-administered medications for this visit.      Allergies:  Allergies  Allergen Reactions  . Chlorhexidine Other (See Comments)    Red and purple burns and peeled skin Pt states Betadine is safe and without reaction.  Marland Kitchen Neomycin     UNSPECIFIED REACTION   . Sulfasalazine Other (See Comments)  . Sulfonamide Derivatives     UNSPECIFIED REACTION   . Augmentin [Amoxicillin-Pot Clavulanate] Nausea And Vomiting    amox ok.   . Hydrocodone Nausea And Vomiting    Severe vomiting  . Oxycodone Nausea And Vomiting    Severe vomiting  . Prednisone Other (See Comments)    insomnia    Past Medical History, Surgical history, Social history, and Family History were reviewed and updated.  Review of Systems:  Remaining  ROS negative.  Physical Exam: Blood pressure 136/81, pulse 89, temperature 98 F (36.7 C), temperature source Oral, resp. rate 18, height 5' 4.5" (1.638 m), weight 227 lb (103 kg), last menstrual period 08/23/2017, SpO2 99 %. ECOG: 0   General appearance: Alert, awake without any distress. Head: Atraumatic without abnormalities Oropharynx: Without any thrush or ulcers. Eyes: No scleral icterus. Lymph nodes: No lymphadenopathy noted in the cervical,  supraclavicular, or axillary nodes Heart:regular rate and rhythm, without any murmurs or gallops.   Lung: Clear to auscultation without any rhonchi, wheezes or dullness to percussion. Abdomin: Soft, nontender without any shifting dullness or ascites. Musculoskeletal: No clubbing or cyanosis. Neurological: No motor or sensory deficits. Skin: Skin examination did not reveal any masses or lesions.  She did have a darker than usual mole noted on her left flank and back.  Slightly asymmetrical without any irritation or bleeding.    Lab Results: Lab Results  Component Value Date   WBC 8.2 12/25/2018   HGB 13.6 12/25/2018   HCT 43.2 12/25/2018   MCV 93.1 12/25/2018   PLT 280 12/25/2018     Chemistry      Component Value Date/Time   NA 143 12/25/2018 1408   K 3.7 12/25/2018 1408   CL 106 12/25/2018 1408   CO2 28 12/25/2018 1408   BUN 14 12/25/2018 1408   CREATININE 0.97 12/25/2018 1408   CREATININE 0.79 05/25/2012 1500   GLU 87 08/30/2017      Component Value Date/Time   CALCIUM 9.6 12/25/2018 1408   ALKPHOS 84 12/25/2018 1408   AST 34 12/25/2018 1408   ALT 57 (H) 12/25/2018 1408   BILITOT 0.2 (L) 12/25/2018 1408       Radiological Studies:  CLINICAL DATA:  Initial treatment strategy for upper middle back melanoma status post wide local excision 04/02/2018 with positive right axillary sentinel lymph node.    Impression and Plan:  48 year old woman with:  1.  Cutaneous melanoma of the back diagnosed in August 2019.  She was found to have T2a N1  without any evidence of relapsed disease.  She did not receive any adjuvant therapy.  The natural course of this disease was discussed again and risk of relapse was assessed.  At this time of recommended active surveillance with repeat imaging studies in 6 months.  This can be done sooner if any reason or abnormalities detected in the interim.   2.  Dermatology surveillance: She does have an abnormal mole on her left leg.   I urged her to obtain a dermatological evaluation or obtain a shave biopsy possible given the suspicious nature of that moment.  She reports that her dermatology's office is not seeing any patients at this time because of COVID-19.  I recommended evaluation by Dr. Dalbert Batman instead for a shave biopsy.  3.  Abnormal mammogram: She is status post lymph node biopsy on the right side as well as a breast lumpectomy without any malignancy.  4.  Follow-up: We will be in November 2020 with repeat imaging studies.  15  minutes was spent with the patient face-to-face today.  More than 50% of time was spent on reviewing her disease status, answering questions regarding future plan of care and treatment options of the future.  Zola Button, MD 5/15/20203:10 PM

## 2018-12-28 ENCOUNTER — Telehealth: Payer: Self-pay | Admitting: Oncology

## 2018-12-28 NOTE — Telephone Encounter (Signed)
Tried to reach regarding schedule °

## 2019-01-06 DIAGNOSIS — M25512 Pain in left shoulder: Secondary | ICD-10-CM | POA: Diagnosis not present

## 2019-01-06 DIAGNOSIS — M7541 Impingement syndrome of right shoulder: Secondary | ICD-10-CM | POA: Diagnosis not present

## 2019-01-06 DIAGNOSIS — M25511 Pain in right shoulder: Secondary | ICD-10-CM | POA: Diagnosis not present

## 2019-01-12 DIAGNOSIS — D2362 Other benign neoplasm of skin of left upper limb, including shoulder: Secondary | ICD-10-CM | POA: Diagnosis not present

## 2019-01-12 DIAGNOSIS — D225 Melanocytic nevi of trunk: Secondary | ICD-10-CM | POA: Diagnosis not present

## 2019-01-12 DIAGNOSIS — Z85820 Personal history of malignant melanoma of skin: Secondary | ICD-10-CM | POA: Diagnosis not present

## 2019-03-11 DIAGNOSIS — H04123 Dry eye syndrome of bilateral lacrimal glands: Secondary | ICD-10-CM | POA: Diagnosis not present

## 2019-03-23 DIAGNOSIS — M25511 Pain in right shoulder: Secondary | ICD-10-CM | POA: Diagnosis not present

## 2019-03-23 DIAGNOSIS — M19011 Primary osteoarthritis, right shoulder: Secondary | ICD-10-CM | POA: Diagnosis not present

## 2019-04-15 ENCOUNTER — Other Ambulatory Visit: Payer: Self-pay | Admitting: General Surgery

## 2019-04-15 DIAGNOSIS — D1722 Benign lipomatous neoplasm of skin and subcutaneous tissue of left arm: Secondary | ICD-10-CM | POA: Diagnosis not present

## 2019-04-15 DIAGNOSIS — R21 Rash and other nonspecific skin eruption: Secondary | ICD-10-CM | POA: Diagnosis not present

## 2019-04-15 DIAGNOSIS — C4359 Malignant melanoma of other part of trunk: Secondary | ICD-10-CM

## 2019-04-15 DIAGNOSIS — D225 Melanocytic nevi of trunk: Secondary | ICD-10-CM | POA: Diagnosis not present

## 2019-04-15 DIAGNOSIS — Z85820 Personal history of malignant melanoma of skin: Secondary | ICD-10-CM | POA: Diagnosis not present

## 2019-05-05 ENCOUNTER — Ambulatory Visit
Admission: RE | Admit: 2019-05-05 | Discharge: 2019-05-05 | Disposition: A | Discharge status: Home / Self Care | Payer: BLUE CROSS/BLUE SHIELD | Source: Ambulatory Visit | Attending: General Surgery | Admitting: General Surgery

## 2019-05-05 ENCOUNTER — Other Ambulatory Visit: Payer: Self-pay

## 2019-05-05 DIAGNOSIS — C4359 Malignant melanoma of other part of trunk: Secondary | ICD-10-CM

## 2019-05-05 DIAGNOSIS — R590 Localized enlarged lymph nodes: Secondary | ICD-10-CM | POA: Diagnosis not present

## 2019-05-05 DIAGNOSIS — Z0000 Encounter for general adult medical examination without abnormal findings: Secondary | ICD-10-CM | POA: Diagnosis not present

## 2019-05-10 DIAGNOSIS — N6310 Unspecified lump in the right breast, unspecified quadrant: Secondary | ICD-10-CM | POA: Diagnosis not present

## 2019-05-10 DIAGNOSIS — B369 Superficial mycosis, unspecified: Secondary | ICD-10-CM | POA: Diagnosis not present

## 2019-05-10 DIAGNOSIS — C4359 Malignant melanoma of other part of trunk: Secondary | ICD-10-CM | POA: Diagnosis not present

## 2019-05-10 DIAGNOSIS — Z6838 Body mass index (BMI) 38.0-38.9, adult: Secondary | ICD-10-CM | POA: Diagnosis not present

## 2019-05-14 ENCOUNTER — Other Ambulatory Visit: Payer: Self-pay | Admitting: Physician Assistant

## 2019-05-14 DIAGNOSIS — G8929 Other chronic pain: Secondary | ICD-10-CM

## 2019-05-14 DIAGNOSIS — M25511 Pain in right shoulder: Secondary | ICD-10-CM

## 2019-05-23 ENCOUNTER — Ambulatory Visit
Admission: RE | Admit: 2019-05-23 | Discharge: 2019-05-23 | Disposition: A | Discharge status: Home / Self Care | Payer: BC Managed Care – PPO | Source: Ambulatory Visit | Attending: Physician Assistant | Admitting: Physician Assistant

## 2019-05-23 ENCOUNTER — Other Ambulatory Visit: Payer: Self-pay

## 2019-05-23 DIAGNOSIS — G8929 Other chronic pain: Secondary | ICD-10-CM

## 2019-05-23 DIAGNOSIS — M25511 Pain in right shoulder: Secondary | ICD-10-CM | POA: Diagnosis not present

## 2019-05-24 ENCOUNTER — Other Ambulatory Visit: Payer: Self-pay

## 2019-05-24 ENCOUNTER — Ambulatory Visit (INDEPENDENT_AMBULATORY_CARE_PROVIDER_SITE_OTHER): Payer: BC Managed Care – PPO

## 2019-05-24 DIAGNOSIS — Z23 Encounter for immunization: Secondary | ICD-10-CM

## 2019-05-28 ENCOUNTER — Encounter: Payer: Self-pay | Admitting: Internal Medicine

## 2019-06-01 ENCOUNTER — Other Ambulatory Visit: Payer: Self-pay

## 2019-06-01 ENCOUNTER — Encounter: Payer: Self-pay | Admitting: Family Medicine

## 2019-06-01 ENCOUNTER — Ambulatory Visit (INDEPENDENT_AMBULATORY_CARE_PROVIDER_SITE_OTHER): Payer: BC Managed Care – PPO | Admitting: Family Medicine

## 2019-06-01 VITALS — BP 126/82 | HR 71 | Temp 97.9°F | Resp 16 | Ht 64.5 in | Wt 227.5 lb

## 2019-06-01 DIAGNOSIS — Z131 Encounter for screening for diabetes mellitus: Secondary | ICD-10-CM | POA: Diagnosis not present

## 2019-06-01 DIAGNOSIS — Z8249 Family history of ischemic heart disease and other diseases of the circulatory system: Secondary | ICD-10-CM | POA: Insufficient documentation

## 2019-06-01 DIAGNOSIS — Z130 Encounter for screening for diseases of the blood and blood-forming organs and certain disorders involving the immune mechanism: Secondary | ICD-10-CM

## 2019-06-01 DIAGNOSIS — E669 Obesity, unspecified: Secondary | ICD-10-CM

## 2019-06-01 DIAGNOSIS — R1012 Left upper quadrant pain: Secondary | ICD-10-CM | POA: Diagnosis not present

## 2019-06-01 DIAGNOSIS — I10 Essential (primary) hypertension: Secondary | ICD-10-CM

## 2019-06-01 DIAGNOSIS — Z0000 Encounter for general adult medical examination without abnormal findings: Secondary | ICD-10-CM | POA: Diagnosis not present

## 2019-06-01 DIAGNOSIS — N951 Menopausal and female climacteric states: Secondary | ICD-10-CM

## 2019-06-01 DIAGNOSIS — E039 Hypothyroidism, unspecified: Secondary | ICD-10-CM | POA: Diagnosis not present

## 2019-06-01 DIAGNOSIS — F32A Depression, unspecified: Secondary | ICD-10-CM

## 2019-06-01 DIAGNOSIS — Z23 Encounter for immunization: Secondary | ICD-10-CM | POA: Diagnosis not present

## 2019-06-01 DIAGNOSIS — R928 Other abnormal and inconclusive findings on diagnostic imaging of breast: Secondary | ICD-10-CM

## 2019-06-01 DIAGNOSIS — C439 Malignant melanoma of skin, unspecified: Secondary | ICD-10-CM | POA: Diagnosis not present

## 2019-06-01 DIAGNOSIS — H9202 Otalgia, left ear: Secondary | ICD-10-CM | POA: Diagnosis not present

## 2019-06-01 DIAGNOSIS — F419 Anxiety disorder, unspecified: Secondary | ICD-10-CM

## 2019-06-01 DIAGNOSIS — F329 Major depressive disorder, single episode, unspecified: Secondary | ICD-10-CM | POA: Diagnosis not present

## 2019-06-01 MED ORDER — FLUTICASONE PROPIONATE 50 MCG/ACT NA SUSP: 2.0000 | Freq: Every day | NASAL | 11 refills | Status: DC

## 2019-06-01 MED ORDER — VENLAFAXINE HCL ER 75 MG PO CP24: 75.0000 mg | ORAL_CAPSULE | Freq: Every day | ORAL | 1 refills | Status: DC

## 2019-06-01 NOTE — Patient Instructions (Signed)
Health Maintenance, Female Adopting a healthy lifestyle and getting preventive care are important in promoting health and wellness. Ask your health care provider about:  The right schedule for you to have regular tests and exams.  Things you can do on your own to prevent diseases and keep yourself healthy. What should I know about diet, weight, and exercise? Eat a healthy diet   Eat a diet that includes plenty of vegetables, fruits, low-fat dairy products, and lean protein.  Do not eat a lot of foods that are high in solid fats, added sugars, or sodium. Maintain a healthy weight Body mass index (BMI) is used to identify weight problems. It estimates body fat based on height and weight. Your health care provider can help determine your BMI and help you achieve or maintain a healthy weight. Get regular exercise Get regular exercise. This is one of the most important things you can do for your health. Most adults should:  Exercise for at least 150 minutes each week. The exercise should increase your heart rate and make you sweat (moderate-intensity exercise).  Do strengthening exercises at least twice a week. This is in addition to the moderate-intensity exercise.  Spend less time sitting. Even light physical activity can be beneficial. Watch cholesterol and blood lipids Have your blood tested for lipids and cholesterol at 50 years of age, then have this test every 5 years. Have your cholesterol levels checked more often if:  Your lipid or cholesterol levels are high.  You are older than 50 years of age.  You are at high risk for heart disease. What should I know about cancer screening? Depending on your health history and family history, you may need to have cancer screening at various ages. This may include screening for:  Breast cancer.  Cervical cancer.  Colorectal cancer.  Skin cancer.  Lung cancer. What should I know about heart disease, diabetes, and high blood  pressure? Blood pressure and heart disease  High blood pressure causes heart disease and increases the risk of stroke. This is more likely to develop in people who have high blood pressure readings, are of African descent, or are overweight.  Have your blood pressure checked: ? Every 3-5 years if you are 18-39 years of age. ? Every year if you are 40 years old or older. Diabetes Have regular diabetes screenings. This checks your fasting blood sugar level. Have the screening done:  Once every three years after age 40 if you are at a normal weight and have a low risk for diabetes.  More often and at a younger age if you are overweight or have a high risk for diabetes. What should I know about preventing infection? Hepatitis B If you have a higher risk for hepatitis B, you should be screened for this virus. Talk with your health care provider to find out if you are at risk for hepatitis B infection. Hepatitis C Testing is recommended for:  Everyone born from 1945 through 1965.  Anyone with known risk factors for hepatitis C. Sexually transmitted infections (STIs)  Get screened for STIs, including gonorrhea and chlamydia, if: ? You are sexually active and are younger than 50 years of age. ? You are older than 50 years of age and your health care provider tells you that you are at risk for this type of infection. ? Your sexual activity has changed since you were last screened, and you are at increased risk for chlamydia or gonorrhea. Ask your health care provider if   you are at risk.  Ask your health care provider about whether you are at high risk for HIV. Your health care provider may recommend a prescription medicine to help prevent HIV infection. If you choose to take medicine to prevent HIV, you should first get tested for HIV. You should then be tested every 3 months for as long as you are taking the medicine. Pregnancy  If you are about to stop having your period (premenopausal) and  you may become pregnant, seek counseling before you get pregnant.  Take 400 to 800 micrograms (mcg) of folic acid every day if you become pregnant.  Ask for birth control (contraception) if you want to prevent pregnancy. Osteoporosis and menopause Osteoporosis is a disease in which the bones lose minerals and strength with aging. This can result in bone fractures. If you are 65 years old or older, or if you are at risk for osteoporosis and fractures, ask your health care provider if you should:  Be screened for bone loss.  Take a calcium or vitamin D supplement to lower your risk of fractures.  Be given hormone replacement therapy (HRT) to treat symptoms of menopause. Follow these instructions at home: Lifestyle  Do not use any products that contain nicotine or tobacco, such as cigarettes, e-cigarettes, and chewing tobacco. If you need help quitting, ask your health care provider.  Do not use street drugs.  Do not share needles.  Ask your health care provider for help if you need support or information about quitting drugs. Alcohol use  Do not drink alcohol if: ? Your health care provider tells you not to drink. ? You are pregnant, may be pregnant, or are planning to become pregnant.  If you drink alcohol: ? Limit how much you use to 0-1 drink a day. ? Limit intake if you are breastfeeding.  Be aware of how much alcohol is in your drink. In the [place] one drink equals one 12 oz bottle of beer (355 mL), one 5 oz glass of wine (148 mL), or one 1 oz glass of hard liquor (44 mL). General instructions  Schedule regular health, dental, and eye exams.  Stay current with your vaccines.  Tell your health care provider if: ? You often feel depressed. ? You have ever been abused or do not feel safe at home. Summary  Adopting a healthy lifestyle and getting preventive care are important in promoting health and wellness.  Follow your health care provider's instructions about healthy  diet, exercising, and getting tested or screened for diseases.  Follow your health care provider's instructions on monitoring your cholesterol and blood pressure. This information is not intended to replace advice given to you by your health care provider. Make sure you discuss any questions you have with your health care provider. Document Released: 02/11/2011 Document Revised: 07/22/2018 Document Reviewed: 07/22/2018 [employer] Patient Education  2020 [employer]  

## 2019-06-01 NOTE — Progress Notes (Signed)
Patient ID: Shannon Quinn, female  DOB: [birth year], 48 y.o.   MRN: [id number] Patient Care Team    Relationship Specialty Notifications Start End  Ma Hillock, DO PCP - General Family Medicine  11/03/17   Lady Gary, [facility]    11/03/17   Everlene Farrier, MD Consulting Physician Obstetrics and Gynecology  11/03/17   Jerrell Belfast, MD Consulting Physician Otolaryngology  11/03/17   Alda Berthold, DO Consulting Physician Neurology  11/03/17   Netta Cedars, MD Consulting Physician Orthopedic Surgery  06/01/19   Fanny Skates, MD Consulting Physician General Surgery  06/01/19     Chief Complaint  Patient presents with  . Annual Exam    Pt is doing well with no complaints. Scheduled for colonoscopy 06/2019. Mammogram 09/11/2018.     Subjective:  Shannon Quinn is a 48 y.o.  Female  present for CPE. All past medical history, surgical history, allergies, family history, immunizations, medications and social history were updated in the electronic medical record today. All recent labs, ED visits and hospitalizations within the last year were reviewed. Melanoma of skin (Shannon Quinn) Follows with dermatology and oncology for routine checks. Hypothyroidism- prior h/o- currently not on medication.  Anxiety and depression/Hot flashes due to menopause Pt reports she is feeling well on effexor 75 mg QD- no complaints.  Abnormality of right breast on screening mammogram Status post lumpectomy in February 2020.  Routine screenings to continue due in January- she has scheduled.  Hypertension/Fhx heart disease/chest discomfort/LUQ discomfort:  Blood pressures ranges at home are not routinely checked. She states she did have an elevated BP at her other doc appt last month. She has been borderline with her BP here the last few visits. Patient denies , shortness of breath or lower extremity edema. Pt does not take  daily baby ASA. Pt is not prescribed statin. She and her family have started a  mediterranean diet ant home. Pt endorses an episode of LUQ pain/chest discomfort that wrapped around to her right back and last for about 20 minutes. This occurred last week. It has not occurred since. Pt reports she was not performing any activity at the time of event. She has concerns it could have been caused by her heart, since her fhx is significant and father had MI at 64. She denies nausea, vomit, bowel changes, radiation of pain to jaw or arm. She endorses lightheadedness at the time event and she admits to lightheadedness one other time since without chest/abd discomfort.  RF: HTN, obesity, FH heart disease and MI at 25 in father.  Health maintenance:  Colonoscopy: scheduled 06/2019.  Mammogram: completed 09/11/2018 - abnormal w/ bx- lumpectomy 09/2018-benign.  Has mammogram scheduled. Cervical cancer screening: last pap: 12/2016, results: Dr. Gertie Fey Immunizations: tdap 05/2012 UTD, Influenza UTD 05/2019 (encouraged yearly), shingrix #1 started today. Infectious disease screening: HIV completed DEXA: routine screen Assistive device: none Oxygen [id number] Patient has a Dental home. Hospitalizations/ED visits: reviewed  Depression screen [facility] 2/9 06/01/2019  Decreased Interest 0  Down, Depressed, Hopeless 0  PHQ - 2 Score 0   No flowsheet data found.   Immunization History  Administered Date(s) Administered  . Influenza,inj,Quad PF,6+ Mos 04/26/2013, 06/09/2016, 05/24/2019  . Tdap 05/25/2012  . Zoster Recombinat (Shingrix) 06/01/2019    Past Medical History:  Diagnosis Date  . Abnormality of right breast on screening mammogram 10/09/2018  . Colon polyps 2011   3 polpys removed  . Complication of anesthesia    "severe Reaction" "Physically ill"   .  Deviated septum   . Family history of adverse reaction to anesthesia    violent when waking up from anesthesia - very sick  . Hypoglycemic syndrome 1996   during pregnancy  . Hypothyroidism   . Lymphedema of right arm   .  Melanoma ([place])    03/10/2018  . PONV (postoperative nausea and vomiting)   . Shoulder injury    right - side happened from last surgery  . Thyroid disease   . Varicose vein of leg    Allergies  Allergen Reactions  . Chlorhexidine Other (See Comments)    Red and purple burns and peeled skin. Cannot use surgical glue.  Pt states Betadine is safe and without reaction.  Marland Kitchen Neomycin     UNSPECIFIED REACTION   . Sulfasalazine Other (See Comments)  . Sulfonamide Derivatives     UNSPECIFIED REACTION   . Augmentin [Amoxicillin-Pot Clavulanate] Nausea And Vomiting    amox ok.   . Hydrocodone Nausea And Vomiting    Severe vomiting  . Oxycodone Nausea And Vomiting    Severe vomiting  . Prednisone Other (See Comments)    insomnia   Past Surgical History:  Procedure Laterality Date  . ABDOMINAL HYSTERECTOMY    . BREAST LUMPECTOMY WITH RADIOACTIVE SEED LOCALIZATION Right 10/09/2018   Procedure: RIGHT BREAST LUMPECTOMY WITH RADIOACTIVE SEED LOCALIZATION ERAS PATHWAY;  Surgeon: Fanny Skates, MD;  Location: [facility];  Service: General;  Laterality: Right;  . COLONOSCOPY     polyps  . LAPAROSCOPIC TOTAL HYSTERECTOMY  09/04/2017   Dr. Gaetano Net for endometriosis  . MELANOMA EXCISION WITH SENTINEL LYMPH NODE BIOPSY Left 04/02/2018   Procedure: INJECT BLUE DYE MELANOMA LEFT BACK WITH  WIDE LOCAL EXCISION MELANOMA LEFT BACK BILATERAL AXILLARY DEEP SENTINEL LYMPH NODE BIOPSY;  Surgeon: Fanny Skates, MD;  Location: [place];  Service: General;  Laterality: Left;  . TUBAL LIGATION    . WISDOM TOOTH EXTRACTION     Family History  Problem Relation Age of Onset  . Arthritis Mother   . Alcohol abuse Father   . Arthritis Father   . Hyperlipidemia Father   . Stroke Father        2012  . Depression Father   . Diabetes Father   . Heart disease Father 6  . Diabetes Sister   . Dementia Maternal Grandmother   . Heart disease Maternal Grandfather 42  . Diabetes  Paternal Grandmother   . COPD Paternal Grandfather        emphysema  . Cancer Paternal Uncle        breast cancer   Social History   Social History Narrative   Regular exercise: walking 1 mile per day   Caffeine use: soda drinker   Lives with husband and 2 children in a one story home.     Works for [employer].  She works in Science writer   Education: associates degree.      Allergies as of 06/01/2019      Reactions   Chlorhexidine Other (See Comments)   Red and purple burns and peeled skin. Cannot use surgical glue.  Pt states Betadine is safe and without reaction.   Neomycin    UNSPECIFIED REACTION    Sulfasalazine Other (See Comments)   Sulfonamide Derivatives    UNSPECIFIED REACTION    Augmentin [amoxicillin-pot Clavulanate] Nausea And Vomiting   amox ok.    Hydrocodone Nausea And Vomiting   Severe vomiting  Oxycodone Nausea And Vomiting   Severe vomiting   Prednisone Other (See Comments)   insomnia      Medication List       Accurate as of June 01, 2019  6:34 PM. If you have any questions, ask your nurse or doctor.        STOP taking these medications   fexofenadine 60 MG tablet Commonly known as: ALLEGRA Stopped by: Howard Pouch, DO     TAKE these medications   cetirizine 10 MG tablet Commonly known as: ZYRTEC Take 10 mg by mouth daily.   fluticasone 50 MCG/ACT nasal spray Commonly known as: FLONASE Place 2 sprays into both nostrils daily.   Omega-3 1000 MG Caps Take 2,000 mg by mouth.   venlafaxine XR 75 MG 24 hr capsule Commonly known as: EFFEXOR-XR Take 1 capsule (75 mg total) by mouth daily.       All past medical history, surgical history, allergies, family history, immunizations andmedications were updated in the EMR today and reviewed under the history and medication portions of their EMR.     No results found for this or any previous visit (from the past 2160 hour(s)).  Mr Shoulder Right Wo  Contrast Result Date: 05/24/2019  IMPRESSION: Rotator cuff tendinopathy is worst in the supraspinatus where there is some fissuring and interstitial tearing of the distal tendon. Negative for full-thickness rotator cuff tear or tendon retraction. Moderate acromioclavicular osteoarthritis. Small volume of subacromial/subdeltoid fluid consistent with bursitis. Electronically Signed   By: Inge Rise M.D.   On: 05/24/2019 10:16     ROS: 14 pt review of systems performed and negative (unless mentioned in an HPI)  Objective: BP 126/82 (BP Location: Left Arm, Patient Position: Sitting, Cuff Size: Normal)   Pulse 71   Temp 97.9 F (36.6 C) (Temporal)   Resp 16   Ht 5' 4.5" (1.638 m)   Wt 227 lb 8 oz (103.2 kg)   LMP 08/23/2017   SpO2 97%   BMI 38.45 kg/m  Gen: Afebrile. No acute distress. Nontoxic in appearance, well-developed, well-nourished, pleasant, obese Caucasian female. HENT: AT. [place]. Bilateral TM visualized and normal in appearance, normal external auditory canal. MMM, no oral lesions, adequate dentition. Bilateral nares within normal limits. Throat without erythema, ulcerations or exudates.  No cough on exam, no hoarseness on exam. Eyes:Pupils Equal Round Reactive to light, Extraocular movements intact,  Conjunctiva without redness, discharge or icterus. Neck/lymp/endocrine: Supple, no lymphadenopathy, no thyromegaly CV: RRR no murmur, no edema, +2/4 P posterior tibialis pulses.  No carotid bruits. No JVD. Chest: CTAB, no wheeze, rhonchi or crackles.  Normal respiratory effort.  Good air movement. Abd: Soft.  Obese. ND.  Mild tenderness epigastric region.  BS present.  No masses palpated. No hepatosplenomegaly. No rebound tenderness or guarding. Skin: No rashes, purpura or petechiae. Warm and well-perfused. Skin intact. Neuro/Msk:  Normal gait. PERLA. EOMi. Alert. Oriented x3.  Cranial nerves II through XII intact. Muscle strength 5/5 upper/lower extremity. DTRs equal bilaterally.  Psych: Normal affect, dress and demeanor. Normal speech. Normal thought content and judgment.   No exam data present  Assessment/plan: Katelin Afridi is a 48 y.o. female present for CPE Melanoma of skin ([place]) Follows with dermatology and oncology for routine checks. hypothyroidism Currently not on medications. - TSH Anxiety and depression/Hot flashes due to menopause Stable.  Refills provided on Effexor 75 mg daily. - TSH -Follow-up every 6 months. Abnormality of right breast on screening mammogram Status post lumpectomy in February 2020.  Routine  screenings to continue due in January. Left upper quadrant pain New complaint.  Uncertain etiology.  Discussed differential diagnosis of gastritis/GERD/pancreatitis versus cardiac in nature.  Symptoms seem GI related and she is mildly tender in her epigastric region today. - Mild tenderness epigastric region, start omeprazole 20 mg daily. - Comprehensive metabolic panel - Lipase Hypertension/Fhx heart disease:  BP is been borderline last few office visits, now greater than [id number] systolic on multiple checks today.  Discussed starting HCTZ 25 mg daily with patient.  She is agreeable to this approach today. New condition.  Low-sodium diet, routine exercise. CBC, CMP, TSH and lipids collected today. Considering early family history of heart disease and potential chest discomfort/pain last week we will refer to cardiology for further evaluation. Follow-up 6 months. Diabetes mellitus screening - Hemoglobin A1c Obesity (BMI 30-39.9) - Lipid panel Screening for deficiency anemia - CBC Need for shingles vaccine -Nurse visit 2-6 months for Shingrix No. 2 - Varicella-zoster vaccine IM Left ear pain New complaint. Your exam is normal.  Discussed use of Flonase and refill this for her. Encounter for preventive health examination Patient was encouraged to exercise greater than 150 minutes a week. Patient was encouraged to choose a diet filled with  fresh fruits and vegetables, and lean meats. AVS provided to patient today for education/recommendation on gender specific health and safety maintenance. Colonoscopy: scheduled 06/2019.  Mammogram: completed 09/11/2018 - abnormal w/ bx- lumpectomy 09/2018-benign.  Has mammogram scheduled. Cervical cancer screening: last pap: 12/2016, results: Dr. Gertie Fey Immunizations: tdap 05/2012 UTD, Influenza UTD 05/2019 (encouraged yearly), shingrix #1 started today. Infectious disease screening: HIV completed DEXA: routine screen  Return in about 1 year (around 05/31/2020) for CPE (30 min).   Annual exam plus > 25 minutes spent with patient, >50% of time spent face to face evaluating and treating 3 new conditions and 1 stable chronic condition.    Electronically signed by: Howard Pouch, DO [place]

## 2019-06-02 ENCOUNTER — Telehealth: Payer: Self-pay | Admitting: Family Medicine

## 2019-06-02 DIAGNOSIS — R1012 Left upper quadrant pain: Secondary | ICD-10-CM

## 2019-06-02 DIAGNOSIS — Z8249 Family history of ischemic heart disease and other diseases of the circulatory system: Secondary | ICD-10-CM

## 2019-06-02 DIAGNOSIS — I10 Essential (primary) hypertension: Secondary | ICD-10-CM

## 2019-06-02 DIAGNOSIS — R0789 Other chest pain: Secondary | ICD-10-CM

## 2019-06-02 LAB — LIPID PANEL
Cholesterol: 197 mg/dL (ref <200)
HDL: 50 mg/dL (ref >=50)
LDL Cholesterol (Calc): 127 mg/dL (calc) — ABNORMAL HIGH
Non-HDL Cholesterol (Calc): 147 mg/dL (calc) — ABNORMAL HIGH (ref <130)
Total CHOL/HDL Ratio: 3.9 (calc) (ref <5.0)
Triglycerides: 95 mg/dL (ref <150)

## 2019-06-02 LAB — CBC
HCT: 43.2 % (ref 35.0–45.0)
Hemoglobin: 14.2 g/dL (ref 11.7–15.5)
MCH: 29.3 pg (ref 27.0–33.0)
MCHC: 32.9 g/dL (ref 32.0–36.0)
MCV: 89.1 fL (ref 80.0–100.0)
MPV: 10.6 fL (ref 7.5–12.5)
Platelets: 319 10*3/uL (ref 140–400)
RBC: 4.85 10*6/uL (ref 3.80–5.10)
RDW: 13.4 % (ref 11.0–15.0)
WBC: 8.4 10*3/uL (ref 3.8–10.8)

## 2019-06-02 LAB — COMPREHENSIVE METABOLIC PANEL
AG Ratio: 1.4 (calc) (ref 1.0–2.5)
ALT: 31 U/L — ABNORMAL HIGH (ref 6–29)
AST: 21 U/L (ref 10–35)
Albumin: 4.2 g/dL (ref 3.6–5.1)
Alkaline phosphatase (APISO): 84 U/L (ref 37–153)
BUN: 13 mg/dL (ref 7–25)
CO2: 25 mmol/L (ref 20–32)
Calcium: 9.8 mg/dL (ref 8.6–10.4)
Chloride: 105 mmol/L (ref 98–110)
Creat: 0.88 mg/dL (ref 0.50–1.05)
Globulin: 3 g/dL (calc) (ref 1.9–3.7)
Glucose, Bld: 87 mg/dL (ref 65–99)
Potassium: 4.4 mmol/L (ref 3.5–5.3)
Sodium: 142 mmol/L (ref 135–146)
Total Bilirubin: 0.3 mg/dL (ref 0.2–1.2)
Total Protein: 7.2 g/dL (ref 6.1–8.1)

## 2019-06-02 LAB — TSH: TSH: 2.53 mIU/L

## 2019-06-02 LAB — HEMOGLOBIN A1C
Hgb A1c MFr Bld: 5.2 % of total Hgb (ref <5.7)
Mean Plasma Glucose: 103 (calc)
eAG (mmol/L): 5.7 (calc)

## 2019-06-02 LAB — LIPASE: Lipase: 22 U/L (ref 7–60)

## 2019-06-02 MED ORDER — HYDROCHLOROTHIAZIDE 25 MG PO TABS: 25.0000 mg | ORAL_TABLET | Freq: Every day | ORAL | 1 refills | Status: DC

## 2019-06-02 MED ORDER — OMEPRAZOLE 40 MG PO CPDR: 40.0000 mg | DELAYED_RELEASE_CAPSULE | Freq: Every day | ORAL | 0 refills | Status: DC

## 2019-06-02 NOTE — Telephone Encounter (Signed)
Please inform patient the following information: -Her thyroid function kidney function and electrolytes are normal. -Her liver enzymes are stable.  She still has mildly elevated ALT enzyme, however it is better than it has been in a year.  It is almost normal with less <29 normal and hers is 31 today.  Her enzymes are secondary to "fatty liver "which has been noted on PET scans and CT scans.  It is likely the dietary changes her family is making is positively impacting her fatty liver and improving her liver function.  Keep up the good work. - Her cholesterol looks good. - Her blood counts are normal - Her diabetes screen/A1c looks great at 5.2 .  Abd/chest discomfort: Given her reports of abdominal pain last week and she was mildly tender in the upper part of her stomach on exam yesterday, I would like her to start a medication called omeprazole.  This medication works to reduce acid in the stomach and is taken for people with reflux, inflammation of the stomach lining and/or ulcerations of the stomach.  Take for 4 weeks, and if pain reoccurs follow-up immediately when pain is present for evaluation. -I also referred her to cardiology as we discussed.  Blood pressure: We discussed adding a diuretic at her appointment to help with her blood pressure.  I have called this in for her it is 1 tab a day-in the morning, she may see increased urination with the use of this pill.  Follow-up in 6 months at her routine follow-ups for other chronic medical conditions. Sooner if BP still > 135/85 with med.

## 2019-06-02 NOTE — Telephone Encounter (Signed)
Pt was called and given lab results/instructions. She verbalized understanding.  

## 2019-06-03 ENCOUNTER — Encounter: Payer: Self-pay | Admitting: Family Medicine

## 2019-06-03 DIAGNOSIS — M75101 Unspecified rotator cuff tear or rupture of right shoulder, not specified as traumatic: Secondary | ICD-10-CM | POA: Diagnosis not present

## 2019-06-03 DIAGNOSIS — M25511 Pain in right shoulder: Secondary | ICD-10-CM | POA: Diagnosis not present

## 2019-06-06 NOTE — Progress Notes (Signed)
Cardiology Office Note   Date:  06/07/2019   ID:  Shannon Quinn, DOB 1971-06-14, MRN [id number]  PCP:  Ma Hillock, DO  Cardiologist:   [name], MD   No chief complaint on file.    History of Present Illness: Shannon Quinn is a 48 y.o. female with hypertension, anxiety, who is being seen today for the evaluation of atypical chest pain and family history of CAD at the request of [place], [place], DO.  She saw Dr. Raoul Pitch 05/2019 and reported episodes of LUQ abdominal/chest discomfort. She describes it as pain in the LUQ that radiated into her back.  At work shehad an episode of R jaw pain that occurred at the same time as the LUQ pain.  It was associated with dizziness.  She has also experienced episodes of dizziness that occurred outside of the abdominal pain.  Her father had a heart attack at age 57.  Shannon Quinn gets exercise randomly.  She is sedentary at work and sometimes tries to exercise after work.  She has been even less active since coronavirus.  She does some yard and garden work.  She tries to bike but notes that she gained a lot of weight since her hysterectomy 08/2017. In the last two years both she and her husband have undergone multiple surgeries.  This has made it more difficult to exercise.  When active she gets short of breath going up hills but otherwise feels well on flat land.  She sometimes gets the LUQ pain with walking and subsides after rest.  She sometimes has associated nausea and diaphoresis.  She also notes swelling in her ankles at times.  It was worse a month ago but seems to have been better lately.  Her family has noted to adopt a Mediterranean diet, which has been a struggle for her.   Past Medical History:  Diagnosis Date  . Abnormality of right breast on screening mammogram 10/09/2018  . Colon polyps 2011   3 polpys removed  . Complication of anesthesia    "severe Reaction" "Physically ill"   . Deviated septum   . Family history of adverse  reaction to anesthesia    violent when waking up from anesthesia - very sick  . Hypoglycemic syndrome 1996   during pregnancy  . Hypothyroidism   . Lymphedema of right arm   . Melanoma ([place])    03/10/2018  . PONV (postoperative nausea and vomiting)   . Shoulder injury    right - side happened from last surgery  . Thyroid disease   . Varicose vein of leg     Past Surgical History:  Procedure Laterality Date  . ABDOMINAL HYSTERECTOMY    . BREAST LUMPECTOMY WITH RADIOACTIVE SEED LOCALIZATION Right 10/09/2018   Procedure: RIGHT BREAST LUMPECTOMY WITH RADIOACTIVE SEED LOCALIZATION ERAS PATHWAY;  Surgeon: Fanny Skates, MD;  Location: [facility];  Service: General;  Laterality: Right;  . COLONOSCOPY     polyps  . LAPAROSCOPIC TOTAL HYSTERECTOMY  09/04/2017   Dr. Gaetano Net for endometriosis  . MELANOMA EXCISION WITH SENTINEL LYMPH NODE BIOPSY Left 04/02/2018   Procedure: INJECT BLUE DYE MELANOMA LEFT BACK WITH  WIDE LOCAL EXCISION MELANOMA LEFT BACK BILATERAL AXILLARY DEEP SENTINEL LYMPH NODE BIOPSY;  Surgeon: Fanny Skates, MD;  Location: [facility];  Service: General;  Laterality: Left;  . TUBAL LIGATION    . WISDOM TOOTH EXTRACTION       Current Outpatient Medications  Medication Sig Dispense Refill  .  cetirizine (ZYRTEC) 10 MG tablet Take 10 mg by mouth daily.    . fluticasone (FLONASE) 50 MCG/ACT nasal spray Place 2 sprays into both nostrils daily. 16 g 11  . hydrochlorothiazide (HYDRODIURIL) 25 MG tablet Take 1 tablet (25 mg total) by mouth daily. 90 tablet 1  . Omega-3 1000 MG CAPS Take 2,000 mg by mouth.    Marland Kitchen omeprazole (PRILOSEC) 40 MG capsule Take 1 capsule (40 mg total) by mouth daily. 30 capsule 0  . venlafaxine XR (EFFEXOR-XR) 75 MG 24 hr capsule Take 1 capsule (75 mg total) by mouth daily. 90 capsule 1  . metoprolol tartrate (LOPRESSOR) 50 MG tablet TAKE 1 TABLET 2 HOURS PRIOR TO YOUR CT 1 tablet 0   No current facility-administered  medications for this visit.     Allergies:   Chlorhexidine, Neomycin, Sulfasalazine, Sulfonamide derivatives, Augmentin [amoxicillin-pot clavulanate], Hydrocodone, Oxycodone, and Prednisone    Social History:  The patient  reports that she has never smoked. She has never used smokeless tobacco. She reports current alcohol use. She reports that she does not use drugs.   Family History:  The patient's family history includes Alcohol abuse in her father; Arthritis in her father and mother; CAD in her maternal grandfather, maternal uncle, and paternal aunt; COPD in her paternal grandfather; Cancer in her paternal uncle; Dementia in her maternal grandmother; Depression in her father; Diabetes in her father, paternal grandmother, and sister; Heart attack in her father; Heart disease (age of onset: 38) in her maternal grandfather; Heart disease (age of onset: 47) in her father; Heart failure in her father; Hyperlipidemia in her father; Stroke in her father.    ROS:  Please see the history of present illness.   Otherwise, review of systems are positive for none.   All other systems are reviewed and negative.    PHYSICAL EXAM: VS:  BP 132/83   Pulse 71   Ht 5' 4.5" (1.638 m)   Wt 224 lb 6.4 oz (101.8 kg)   LMP 08/23/2017   SpO2 95%   BMI 37.92 kg/m  , BMI Body mass index is 37.92 kg/m. GENERAL:  Well appearing HEENT:  Pupils equal round and reactive, fundi not visualized, oral mucosa unremarkable NECK:  No jugular venous distention, waveform within normal limits, carotid upstroke brisk and symmetric, no bruits, no thyromegaly LYMPHATICS:  No cervical adenopathy LUNGS:  Clear to auscultation bilaterally HEART:  RRR.  PMI not displaced or sustained,S1 and S2 within normal limits, no S3, no S4, no clicks, no rubs, no murmurs ABD:  Flat, positive bowel sounds normal in frequency in pitch, no bruits, no rebound, no guarding, no midline pulsatile mass, no hepatomegaly, no splenomegaly EXT:  2 plus  pulses throughout, no edema, no cyanosis no clubbing SKIN:  No rashes no nodules NEURO:  Cranial nerves II through XII grossly intact, motor grossly intact throughout PSYCH:  Cognitively intact, oriented to person place and time    EKG:  EKG is ordered today. The ekg ordered today demonstrates sinus rhythm.  Rate 61 bpm.  Cannot rule out prior inferior infarct.    Recent Labs: 06/01/2019: ALT 31; BUN 13; Creat 0.88; Hemoglobin 14.2; Platelets 319; Potassium 4.4; Sodium 142; TSH 2.53    Lipid Panel    Component Value Date/Time   CHOL 197 06/01/2019 1556   TRIG 95 06/01/2019 1556   HDL 50 06/01/2019 1556   CHOLHDL 3.9 06/01/2019 1556   VLDL 20 05/25/2012 1500   LDLCALC 127 (H) 06/01/2019 1556  Wt Readings from Last 3 Encounters:  06/07/19 224 lb 6.4 oz (101.8 kg)  06/01/19 227 lb 8 oz (103.2 kg)  12/25/18 227 lb (103 kg)      ASSESSMENT AND PLAN:  # LUQ pain: Symptoms are atypical.  However the fact that it occurs with exertion and her family history, there is concern that this could be an atypical presentation of ischemia.  We will get a coronary CT-A to assess her coronaries.  If this is normal, consider sleep study.  # Pure hypercholesterolemia:  Lipids are elevated.  Her LDL goal will be determined based on her coronary CT-A.  Work on diet and exercise for now.  # Essential hypertension: Blood pressure is a little above goal today but she has not yet taken her HCTZ.  Continue to monitor.  # Obesity:  Increase exercise to 150 minutes/week.  Continue to work on the [employer].  Current medicines are reviewed at length with the patient today.  The patient does not have concerns regarding medicines.  The following changes have been made:  no change  Labs/ tests ordered today include:   Orders Placed This Encounter  Procedures  . CT CORONARY MORPH W/CTA COR W/SCORE W/CA W/CM &/OR WO/CM  . CT CORONARY FRACTIONAL FLOW RESERVE DATA PREP  . CT CORONARY  FRACTIONAL FLOW RESERVE FLUID ANALYSIS  . Basic metabolic panel  . EKG 12-Lead     Disposition:   FU with Nyjai Graff C. Oval Linsey, MD, [facility] in 4 months.      Signed, Miguel Medal C. Oval Linsey, MD, [facility]  06/07/2019 1:39 PM    [place]

## 2019-06-07 ENCOUNTER — Encounter: Payer: Self-pay | Admitting: Cardiovascular Disease

## 2019-06-07 ENCOUNTER — Other Ambulatory Visit: Payer: Self-pay

## 2019-06-07 ENCOUNTER — Ambulatory Visit (INDEPENDENT_AMBULATORY_CARE_PROVIDER_SITE_OTHER): Payer: BC Managed Care – PPO | Admitting: Cardiovascular Disease

## 2019-06-07 VITALS — BP 132/83 | HR 71 | Ht 64.5 in | Wt 224.4 lb

## 2019-06-07 DIAGNOSIS — R0789 Other chest pain: Secondary | ICD-10-CM

## 2019-06-07 DIAGNOSIS — R1010 Upper abdominal pain, unspecified: Secondary | ICD-10-CM

## 2019-06-07 DIAGNOSIS — R0602 Shortness of breath: Secondary | ICD-10-CM

## 2019-06-07 DIAGNOSIS — I10 Essential (primary) hypertension: Secondary | ICD-10-CM | POA: Diagnosis not present

## 2019-06-07 DIAGNOSIS — E7800 Pure hypercholesterolemia, unspecified: Secondary | ICD-10-CM

## 2019-06-07 HISTORY — DX: Other chest pain: R07.89

## 2019-06-07 MED ORDER — METOPROLOL TARTRATE 50 MG PO TABS: ORAL_TABLET | ORAL | 0 refills | Status: DC

## 2019-06-07 NOTE — Patient Instructions (Addendum)
Medication Instructions:  TAKE METOPROLOL 50 MG 2 HOURS PRIOR TO YOUR CT  *If you need a refill on your cardiac medications before your next appointment, please call your pharmacy*  Lab Work: BMET 1 WEEK PRIOR TO CARDIAC CT  If you have labs (blood work) drawn today and your tests are completely normal, you will receive your results only by: Marland Kitchen MyChart Message (if you have MyChart) OR . A paper copy in the mail If you have any lab test that is abnormal or we need to change your treatment, we will call you to review the results.  Testing/Procedures: Your physician has requested that you have cardiac CT. Cardiac computed tomography (CT) is a painless test that uses an x-ray machine to take clear, detailed pictures of your heart. For further information please visit [email or web address]. Please follow instruction sheet as given.  Follow-Up: At [facility], you and your health needs are our priority.  As part of our continuing mission to provide you with exceptional heart care, we have created designated Provider Care Teams.  These Care Teams include your primary Cardiologist (physician) and Advanced Practice Providers (APPs -  Physician Assistants and Nurse Practitioners) who all work together to provide you with the care you need, when you need it.  Your next appointment:   4 months  The format for your next appointment:   Either In Person or Virtual  Provider:   You may see DR [facility] or one of the following Advanced Practice Providers on your designated Care Team:    Kerin Ransom, PA-C  [place], [place]  Coletta Memos,    Other Instructions   Your cardiac CT will be scheduled at one of the below locations:   [facility] [address] [place], [place] [address] [phone]  [place] [address] [place], [place] [address] [phone]  If scheduled at [facility], please arrive at  the [facility] main entrance of [facility] 30-45 minutes prior to test start time. Proceed to the [facility] Radiology Department (first floor) to check-in and test prep.  If scheduled at [facility], please arrive 15 mins early for check-in and test prep.  Please follow these instructions carefully (unless otherwise directed):  Hold all erectile dysfunction medications at least 3 days (72 hrs) prior to test.  On the Night Before the Test: . Be sure to Drink plenty of water. . Do not consume any caffeinated/decaffeinated beverages or chocolate 12 hours prior to your test. . Do not take any antihistamines 12 hours prior to your test. . If the patient has contrast allergy: ? Patient will need a prescription for Prednisone and very clear instructions (as follows): 1. Prednisone 50 mg - take 13 hours prior to test 2. Take another Prednisone 50 mg 7 hours prior to test 3. Take another Prednisone 50 mg 1 hour prior to test 4. Take Benadryl 50 mg 1 hour prior to test . Patient must complete all four doses of above prophylactic medications. . Patient will need a ride after test due to Benadryl.  On the Day of the Test: . Drink plenty of water. Do not drink any water within one hour of the test. . Do not eat any food 4 hours prior to the test. . You may take your regular medications prior to the test.  . Take metoprolol (Lopressor) two hours prior to test. . HOLD Furosemide/Hydrochlorothiazide morning of the test. .  FEMALES- please wear underwire-free bra if available   Do not give Lopressor to patients with an allergy to lopressor or anyone with asthma or active COPD symptoms (currently taking steroids).       After the Test: . Drink plenty of water. . After receiving IV contrast, you may experience a mild flushed feeling. This is normal. . On occasion, you may experience a mild rash up to 24 hours after the test. This is not dangerous. If this occurs,  you can take Benadryl 25 mg and increase your fluid intake. . If you experience trouble breathing, this can be serious. If it is severe call 911 IMMEDIATELY. If it is mild, please call our office. . If you take any of these medications: Glipizide/Metformin, Avandament, Glucavance, please do not take 48 hours after completing test unless otherwise instructed.   Once we have confirmed authorization from your insurance company, we will call you to set up a date and time for your test.   For non-scheduling related questions, please contact the cardiac imaging nurse navigator should you have any questions/concerns: Marchia Bond, RN Navigator Cardiac Imaging [facility] [phone] Office     Cardiac CT Angiogram  A cardiac CT angiogram is a procedure to look at the heart and the area around the heart. It may be done to help find the cause of chest pains or other symptoms of heart disease. During this procedure, a large X-ray machine, called a CT scanner, takes detailed pictures of the heart and the surrounding area after a dye (contrast material) has been injected into blood vessels in the area. The procedure is also sometimes called a coronary CT angiogram, coronary artery scanning, or CTA. A cardiac CT angiogram allows the health care provider to see how well blood is flowing to and from the heart. The health care provider will be able to see if there are any problems, such as:  Blockage or narrowing of the coronary arteries in the heart.  Fluid around the heart.  Signs of weakness or disease in the muscles, valves, and tissues of the heart. Tell a health care provider about:  Any allergies you have. This is especially important if you have had a previous allergic reaction to contrast dye.  All medicines you are taking, including vitamins, herbs, eye drops, creams, and over-the-counter medicines.  Any blood disorders you have.  Any surgeries you have  had.  Any medical conditions you have.  Whether you are pregnant or may be pregnant.  Any anxiety disorders, chronic pain, or other conditions you have that may increase your stress or prevent you from lying still. What are the risks? Generally, this is a safe procedure. However, problems may occur, including:  Bleeding.  Infection.  Allergic reactions to medicines or dyes.  Damage to other structures or organs.  Kidney damage from the dye or contrast that is used.  Increased risk of cancer from radiation exposure. This risk is low. Talk with your health care provider about: ? The risks and benefits of testing. ? How you can receive the lowest dose of radiation. What happens before the procedure?  Wear comfortable clothing and remove any jewelry, glasses, dentures, and hearing aids.  Follow instructions from your health care provider about eating and drinking. This may include: ? For 12 hours before the test - avoid caffeine. This includes tea, coffee, soda, energy drinks, and diet pills. Drink plenty of water or other fluids that do not have caffeine in them.  Being well-hydrated can prevent complications. ? For 4-6 hours before the test - stop eating and drinking. The contrast dye can cause nausea, but this is less likely if your stomach is empty.  Ask your health care provider about changing or stopping your regular medicines. This is especially important if you are taking diabetes medicines, blood thinners, or medicines to treat erectile dysfunction. What happens during the procedure?  Hair on your chest may need to be removed so that small sticky patches called electrodes can be placed on your chest. These will transmit information that helps to monitor your heart during the test.  An IV tube will be inserted into one of your veins.  You might be given a medicine to control your heart rate during the test. This will help to ensure that good images are obtained.  You will  be asked to lie on an exam table. This table will slide in and out of the CT machine during the procedure.  Contrast dye will be injected into the IV tube. You might feel warm, or you may get a metallic taste in your mouth.  You will be given a medicine (nitroglycerin) to relax (dilate) the arteries in your heart.  The table that you are lying on will move into the CT machine tunnel for the scan.  The person running the machine will give you instructions while the scans are being done. You may be asked to: ? Keep your arms above your head. ? Hold your breath. ? Stay very still, even if the table is moving.  When the scanning is complete, you will be moved out of the machine.  The IV tube will be removed. The procedure may vary among health care providers and hospitals. What happens after the procedure?  You might feel warm, or you may get a metallic taste in your mouth from the contrast dye.  You may have a headache from the nitroglycerin.  After the procedure, drink water or other fluids to wash (flush) the contrast material out of your body.  Contact a health care provider if you have any symptoms of allergy to the contrast. These symptoms include: ? Shortness of breath. ? Rash or hives. ? A racing heartbeat.  Most people can return to their normal activities right after the procedure. Ask your health care provider what activities are safe for you.  It is up to you to get the results of your procedure. Ask your health care provider, or the department that is doing the procedure, when your results will be ready. Summary  A cardiac CT angiogram is a procedure to look at the heart and the area around the heart. It may be done to help find the cause of chest pains or other symptoms of heart disease.  During this procedure, a large X-ray machine, called a CT scanner, takes detailed pictures of the heart and the surrounding area after a dye (contrast material) has been injected into  blood vessels in the area.  Ask your health care provider about changing or stopping your regular medicines before the procedure. This is especially important if you are taking diabetes medicines, blood thinners, or medicines to treat erectile dysfunction.  After the procedure, drink water or other fluids to wash (flush) the contrast material out of your body. This information is not intended to replace advice given to you by your health care provider. Make sure you discuss any questions you have with your health care provider. Document Released: 07/11/2008 Document Revised:  07/11/2017 Document Reviewed: 06/17/2016 Elsevier Patient Education  [place].

## 2019-06-17 ENCOUNTER — Telehealth: Payer: Self-pay | Admitting: *Deleted

## 2019-06-17 ENCOUNTER — Ambulatory Visit (AMBULATORY_SURGERY_CENTER): Payer: Self-pay | Admitting: *Deleted

## 2019-06-17 ENCOUNTER — Other Ambulatory Visit: Payer: Self-pay

## 2019-06-17 VITALS — Temp 96.6°F | Ht 64.5 in | Wt 226.0 lb

## 2019-06-17 DIAGNOSIS — Z1211 Encounter for screening for malignant neoplasm of colon: Secondary | ICD-10-CM

## 2019-06-17 MED ORDER — SUPREP BOWEL PREP KIT 17.5-3.13-1.6 GM/177ML PO SOLN: 1.0000 | Freq: Once | ORAL | 0 refills | Status: AC

## 2019-06-17 NOTE — Telephone Encounter (Signed)
Patient is being scheduled for cardiac ct related to family hx of premature cardiac problems and recent chest pain. This test has not been scheduled yet by cardiology. May we continue on with planned colonoscopy for Thursday, 07/01/2019. Thanks for your advice Margie Ege RN

## 2019-06-17 NOTE — Progress Notes (Signed)
No egg or soy allergy known to patient  No issues with past sedation with any surgeries  or procedures, no intubation problems   PATIENT STATING SHE HAS SURGICAL AMNESIA FOLLOWING ANESTHESIA No diet pills per patient No home 02 use per patient  No blood thinners per patient  Pt denies issues with constipation  No A fib or A flutter  EMMI INFORMATION GIVEN  Patient to be scheduled for cardicac ct, te to dr Henrene Pastor and Osvaldo Angst crna for confirmation to proceed with procedure, patient aware  SUPREP COUPON GIVEN  Due to the COVID-19 pandemic we are asking patients to follow these guidelines. Please only bring one care partner. Please be aware that your care partner may wait in the car in the parking lot or if they feel like they will be too hot to wait in the car, they may wait in the lobby on the 4th floor. All care partners are required to wear a mask the entire time (we do not have any that we can provide them), they need to practice social distancing, and we will do a Covid check for all patient's and care partners when you arrive. Also we will check their temperature and your temperature. If the care partner waits in their car they need to stay in the parking lot the entire time and we will call them on their cell phone when the patient is ready for discharge so they can bring the car to the front of the building. Also all patient's will need to wear a mask into building.

## 2019-06-18 ENCOUNTER — Encounter: Payer: Self-pay | Admitting: Internal Medicine

## 2019-06-18 NOTE — Telephone Encounter (Signed)
Probably, but let's ask Dr. Oval Linsey (I reviewed the recent cardiology note. Pain atypical LUQ). Thanks

## 2019-06-18 NOTE — Telephone Encounter (Signed)
Patient was called to give her the doctors recommendation regarding proceeding with colonoscopy. No answer. Left a message on voicemail that it was ok to proceed with colonoscopy as scheduled.   Riki Sheer, LPN ( [facility] )

## 2019-06-18 NOTE — Telephone Encounter (Signed)
I agree.  Low risk to proceed with colonoscopy.

## 2019-06-18 NOTE — Telephone Encounter (Signed)
Shannon Quinn,  I concur with Dr. Henrene Pastor.  Osvaldo Angst

## 2019-06-22 ENCOUNTER — Other Ambulatory Visit: Payer: Self-pay

## 2019-06-22 ENCOUNTER — Ambulatory Visit (HOSPITAL_COMMUNITY)
Admission: RE | Admit: 2019-06-22 | Discharge: 2019-06-22 | Disposition: A | Discharge status: Home / Self Care | Payer: BC Managed Care – PPO | Source: Ambulatory Visit | Attending: Oncology | Admitting: Oncology

## 2019-06-22 ENCOUNTER — Inpatient Hospital Stay: Payer: BC Managed Care – PPO | Attending: Oncology

## 2019-06-22 DIAGNOSIS — C439 Malignant melanoma of skin, unspecified: Secondary | ICD-10-CM | POA: Diagnosis not present

## 2019-06-22 DIAGNOSIS — Z85820 Personal history of malignant melanoma of skin: Secondary | ICD-10-CM | POA: Diagnosis not present

## 2019-06-22 LAB — CMP (CANCER CENTER ONLY)
ALT: 40 U/L (ref 0–44)
AST: 27 U/L (ref 15–41)
Albumin: 3.9 g/dL (ref 3.5–5.0)
Alkaline Phosphatase: 77 U/L (ref 38–126)
Anion gap: 11 (ref 5–15)
BUN: 13 mg/dL (ref 6–20)
CO2: 29 mmol/L (ref 22–32)
Calcium: 9.3 mg/dL (ref 8.9–10.3)
Chloride: 104 mmol/L (ref 98–111)
Creatinine: 0.98 mg/dL (ref 0.44–1.00)
GFR, Est AFR Am: >60 mL/min (ref >60)
GFR, Estimated: >60 mL/min (ref >60)
Glucose, Bld: 101 mg/dL — ABNORMAL HIGH (ref 70–99)
Potassium: 3.4 mmol/L — ABNORMAL LOW (ref 3.5–5.1)
Sodium: 144 mmol/L (ref 135–145)
Total Bilirubin: 0.5 mg/dL (ref 0.3–1.2)
Total Protein: 7.5 g/dL (ref 6.5–8.1)

## 2019-06-22 LAB — CBC WITH DIFFERENTIAL (CANCER CENTER ONLY)
Abs Immature Granulocytes: 0.02 10*3/uL (ref 0.00–0.07)
Basophils Absolute: 0.1 10*3/uL (ref 0.0–0.1)
Basophils Relative: 1 %
Eosinophils Absolute: 0.1 10*3/uL (ref 0.0–0.5)
Eosinophils Relative: 2 %
HCT: 44.6 % (ref 36.0–46.0)
Hemoglobin: 14.5 g/dL (ref 12.0–15.0)
Immature Granulocytes: 0 %
Lymphocytes Relative: 29 %
Lymphs Abs: 1.7 10*3/uL (ref 0.7–4.0)
MCH: 29.5 pg (ref 26.0–34.0)
MCHC: 32.5 g/dL (ref 30.0–36.0)
MCV: 90.7 fL (ref 80.0–100.0)
Monocytes Absolute: 0.4 10*3/uL (ref 0.1–1.0)
Monocytes Relative: 6 %
Neutro Abs: 3.6 10*3/uL (ref 1.7–7.7)
Neutrophils Relative %: 62 %
Platelet Count: 289 10*3/uL (ref 150–400)
RBC: 4.92 MIL/uL (ref 3.87–5.11)
RDW: 13.6 % (ref 11.5–15.5)
WBC Count: 5.8 10*3/uL (ref 4.0–10.5)
nRBC: 0 % (ref 0.0–0.2)

## 2019-06-22 LAB — GLUCOSE, CAPILLARY: Glucose-Capillary: 96 mg/dL (ref 70–99)

## 2019-06-22 MED ORDER — FLUDEOXYGLUCOSE F - 18 (FDG) INJECTION
11.9300 | Freq: Once | INTRAVENOUS | Status: AC | PRN
  Administered 2019-06-22: 11.93 via INTRAVENOUS

## 2019-06-28 ENCOUNTER — Ambulatory Visit (INDEPENDENT_AMBULATORY_CARE_PROVIDER_SITE_OTHER): Payer: BC Managed Care – PPO

## 2019-06-28 ENCOUNTER — Other Ambulatory Visit: Payer: Self-pay | Admitting: Internal Medicine

## 2019-06-28 DIAGNOSIS — Z03818 Encounter for observation for suspected exposure to other biological agents ruled out: Secondary | ICD-10-CM | POA: Diagnosis not present

## 2019-06-28 DIAGNOSIS — Z1159 Encounter for screening for other viral diseases: Secondary | ICD-10-CM

## 2019-06-29 ENCOUNTER — Other Ambulatory Visit: Payer: Self-pay

## 2019-06-29 ENCOUNTER — Inpatient Hospital Stay (HOSPITAL_BASED_OUTPATIENT_CLINIC_OR_DEPARTMENT_OTHER): Payer: BC Managed Care – PPO | Admitting: Oncology

## 2019-06-29 VITALS — BP 136/83 | HR 63 | Temp 97.8°F | Resp 17 | Ht 64.5 in | Wt 229.1 lb

## 2019-06-29 DIAGNOSIS — C439 Malignant melanoma of skin, unspecified: Secondary | ICD-10-CM | POA: Diagnosis not present

## 2019-06-29 DIAGNOSIS — Z85820 Personal history of malignant melanoma of skin: Secondary | ICD-10-CM | POA: Diagnosis not present

## 2019-06-29 LAB — SARS CORONAVIRUS 2 (TAT 6-24 HRS): SARS Coronavirus 2: NEGATIVE

## 2019-06-29 NOTE — Progress Notes (Signed)
Hematology and Oncology Follow Up Visit  Shannon Quinn [id number] [birth year] 48 y.o. 06/29/2019 2:50 PM Shannon Quinn, Shannon Quinn, Shannon Quinn, Shannon A, DO   Principle Diagnosis: 48 year old woman with T2aN1 melanoma of the upper back diagnosed in 2019.  She was found to have 1.1 mm depth with 1 out of 10 lymph node involved with 0.1 mm involvement in the lymph node.   Prior Therapy: She underwent wide excision with bilateral sentinel lymph node biopsy of the axilla.  The final pathology obtained on April 02, 2018 showed Quinn residual melanoma of 0.8 mm T staging was T2a.  She had bilateral lymph node sentinel lymph node biopsy with 1 out of 10 lymph nodes were positive for melanoma.  Less than 0.1 mm tiny focus of melanoma seen on 1 of those 10 lymph nodes.   Current therapy: Active surveillance.  Interim History: [name] returns today for Quinn follow-up visit.  Since the last visit, she reports no major changes in her health.  She did have Quinn skin lesion removed from her calf but no melanoma detected.  She remains active and continues to attend to activities of daily living.  She is undergoing Quinn cardiac CT for complete evaluation.  She continues to follow with dermatology at this time.  Patient denied headaches, blurry vision, syncope or seizures.  Denies any fevers, chills or sweats.  Denied chest pain, palpitation, orthopnea or leg edema.  Denied cough, wheezing or hemoptysis.  Denied nausea, vomiting or abdominal pain.  Denies any constipation or diarrhea.  Denies any frequency urgency or hesitancy.  Denies any arthralgias or myalgias.  Denies any skin rashes or lesions.  Denies any bleeding or clotting tendency.  Denies any easy bruising.  Denies any hair or nail changes.  Denies any anxiety or depression.  Remaining review of system is negative.              Medications: Updated without any changes. Current Outpatient Medications  Medication Sig Dispense Refill  . cetirizine (ZYRTEC) 10 MG  tablet Take 10 mg by mouth daily.    . fluticasone (FLONASE) 50 MCG/ACT nasal spray Place 2 sprays into both nostrils daily. 16 g 11  . hydrochlorothiazide (HYDRODIURIL) 25 MG tablet Take 1 tablet (25 mg total) by mouth daily. 90 tablet 1  . ibuprofen (ADVIL) 200 MG tablet Take 200 mg by mouth every 6 (six) hours as needed.    . metoprolol tartrate (LOPRESSOR) 50 MG tablet TAKE 1 TABLET 2 HOURS PRIOR TO YOUR CT (Patient not taking: Reported on 06/17/2019) 1 tablet 0  . Omega-3 1000 MG CAPS Take 2,000 mg by mouth.    Marland Kitchen omeprazole (PRILOSEC) 40 MG capsule Take 1 capsule (40 mg total) by mouth daily. 30 capsule 0  . OVER THE COUNTER MEDICATION     . venlafaxine XR (EFFEXOR-XR) 75 MG 24 hr capsule Take 1 capsule (75 mg total) by mouth daily. 90 capsule 1   No current facility-administered medications for this visit.      Allergies:  Allergies  Allergen Reactions  . Chlorhexidine Other (See Comments)    Red and purple burns and peeled skin. Cannot use surgical glue.  Pt states Betadine is safe and without reaction.  Marland Kitchen Neomycin     UNSPECIFIED REACTION   . Sulfasalazine Other (See Comments)  . Sulfonamide Derivatives     UNSPECIFIED REACTION   . Augmentin [Amoxicillin-Pot Clavulanate] Nausea And Vomiting    amox ok.   . Hydrocodone Nausea And Vomiting  Severe vomiting  . Oxycodone Nausea And Vomiting    Severe vomiting  . Prednisone Other (See Comments)    insomnia    Past Medical History, Surgical history, Social history, and Family History were reviewed and updated.    Physical Exam: Blood pressure 136/83, pulse 63, temperature 97.8 F (36.6 C), temperature source Temporal, resp. rate 17, height 5' 4.5" (1.638 m), weight 229 lb 1.6 oz (103.9 kg), last menstrual period 08/23/2017, SpO2 100 %.   ECOG: 0  General appearance: Comfortable appearing without any discomfort Head: Normocephalic without any trauma Oropharynx: Mucous membranes are moist and pink without any thrush  or ulcers. Eyes: Pupils are equal and round reactive to light. Lymph nodes: No cervical, supraclavicular, inguinal or axillary lymphadenopathy.   Heart:regular rate and rhythm.  S1 and S2 without leg edema. Lung: Clear without any rhonchi or wheezes.  No dullness to percussion. Abdomin: Soft, nontender, nondistended with good bowel sounds.  No hepatosplenomegaly. Musculoskeletal: No joint deformity or effusion.  Full range of motion noted. Neurological: No deficits noted on motor, sensory and deep tendon reflex exam. Skin: No petechial rash or dryness.  Appeared moist.  Psychiatric: Mood and affect appeared appropriate.  .    Lab Results: Lab Results  Component Value Date   WBC 5.8 06/22/2019   HGB 14.5 06/22/2019   HCT 44.6 06/22/2019   MCV 90.7 06/22/2019   PLT 289 06/22/2019     Chemistry      Component Value Date/Time   NA 144 06/22/2019 1102   K 3.4 (L) 06/22/2019 1102   CL 104 06/22/2019 1102   CO2 29 06/22/2019 1102   BUN 13 06/22/2019 1102   CREATININE 0.98 06/22/2019 1102   CREATININE 0.88 06/01/2019 1556   GLU 87 08/30/2017      Component Value Date/Time   CALCIUM 9.3 06/22/2019 1102   ALKPHOS 77 06/22/2019 1102   AST 27 06/22/2019 1102   ALT 40 06/22/2019 1102   BILITOT 0.5 06/22/2019 1102      IMPRESSION: 1. Small left supraclavicular and axillary lymph nodes have low-grade metabolic activity. These are small (less than or equal to 6 mm in short axis) and low-grade but the appearance is asymmetric compared to the contralateral side, and these lymph nodes merit imaging surveillance. 2. No specific/definite findings of active malignancy. 3. Nonobstructive left nephrolithiasis   Impression and Plan:  48 year old woman with:  1. 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    She is currently on active surveillance and has not received adjuvant therapy.  PET CT scan obtained on 06/22/2019 was personally reviewed and showed no  convincing evidence of metastatic disease.  The natural course of this disease as well as the risk of relapse was assessed at this time.  I have recommended continued monitoring at this time.   2.  Dermatology surveillance: He is up-to-date at this time I recommended continuing to do so.  3.  Age-appropriate cancer screening: She remains up-to-date at this time.  She will have repeat ultrasound in 2021.  4.  Follow-up:will be in 6 months for repeat evaluation.  15  minutes was spent with the patient face-to-face today.  More than 50% of time was dedicated to reviewing imaging studies, disease status update and future recommendation for plan of care.  Zola Button, MD 11/17/20202:50 PM

## 2019-06-30 ENCOUNTER — Telehealth: Payer: Self-pay | Admitting: Oncology

## 2019-06-30 NOTE — Telephone Encounter (Signed)
Scheduled appt per 11/17 los.  Left a VM of the appt date and time.

## 2019-07-01 ENCOUNTER — Other Ambulatory Visit: Payer: Self-pay

## 2019-07-01 ENCOUNTER — Ambulatory Visit (AMBULATORY_SURGERY_CENTER): Payer: BC Managed Care – PPO | Admitting: Internal Medicine

## 2019-07-01 ENCOUNTER — Encounter: Payer: Self-pay | Admitting: Internal Medicine

## 2019-07-01 VITALS — BP 137/80 | HR 71 | Temp 98.0°F | Resp 10 | Ht 64.0 in | Wt 226.0 lb

## 2019-07-01 DIAGNOSIS — Z1211 Encounter for screening for malignant neoplasm of colon: Secondary | ICD-10-CM

## 2019-07-01 DIAGNOSIS — Z86010 Personal history of colonic polyps: Secondary | ICD-10-CM

## 2019-07-01 DIAGNOSIS — Z800 Family history of malignant neoplasm of digestive organs: Secondary | ICD-10-CM | POA: Diagnosis not present

## 2019-07-01 MED ORDER — SODIUM CHLORIDE 0.9 % IV SOLN: 500.0000 mL | Freq: Once | INTRAVENOUS | Status: DC

## 2019-07-01 NOTE — Patient Instructions (Signed)
Impression/Recommendations:  Repeat colonoscopy in 10 years for screening purposes.  Resume previous diet. Continue present medications.  YOU HAD AN ENDOSCOPIC PROCEDURE TODAY AT [facility]:   Refer to the procedure report that was given to you for any specific questions about what was found during the examination.  If the procedure report does not answer your questions, please call your gastroenterologist to clarify.  If you requested that your care partner not be given the details of your procedure findings, then the procedure report has been included in a sealed envelope for you to review at your convenience later.  YOU SHOULD EXPECT: Some feelings of bloating in the abdomen. Passage of more gas than usual.  Walking can help get rid of the air that was put into your GI tract during the procedure and reduce the bloating. If you had a lower endoscopy (such as a colonoscopy or flexible sigmoidoscopy) you may notice spotting of blood in your stool or on the toilet paper. If you underwent a bowel prep for your procedure, you may not have a normal bowel movement for a few days.  Please Note:  You might notice some irritation and congestion in your nose or some drainage.  This is from the oxygen used during your procedure.  There is no need for concern and it should clear up in a day or so.  SYMPTOMS TO REPORT IMMEDIATELY:   Following lower endoscopy (colonoscopy or flexible sigmoidoscopy):  Excessive amounts of blood in the stool  Significant tenderness or worsening of abdominal pains  Swelling of the abdomen that is new, acute  Fever of 100F or higher  For urgent or emergent issues, a gastroenterologist can be reached at any hour by calling [phone].   DIET:  We do recommend a small meal at first, but then you may proceed to your regular diet.  Drink plenty of fluids but you should avoid alcoholic beverages for 24 hours.  ACTIVITY:  You should plan to take it easy for  the rest of today and you should NOT DRIVE or use heavy machinery until tomorrow (because of the sedation medicines used during the test).    FOLLOW UP: Our staff will call the number listed on your records 48-72 hours following your procedure to check on you and address any questions or concerns that you may have regarding the information given to you following your procedure. If we do not reach you, we will leave a message.  We will attempt to reach you two times.  During this call, we will ask if you have developed any symptoms of COVID 19. If you develop any symptoms (ie: fever, flu-like symptoms, shortness of breath, cough etc.) before then, please call [phone].  If you test positive for Covid 19 in the 2 weeks post procedure, please call and report this information to [name].    If any biopsies were taken you will be contacted by phone or by letter within the next 1-3 weeks.  Please call us at [phone] if you have not heard about the biopsies in 3 weeks.    SIGNATURES/CONFIDENTIALITY: You and/or your care partner have signed paperwork which will be entered into your electronic medical record.  These signatures attest to the fact that that the information above on your After Visit Summary has been reviewed and is understood.  Full responsibility of the confidentiality of this discharge information lies with you and/or your care-partner.

## 2019-07-01 NOTE — Op Note (Signed)
[place] Patient Name: Shannon Quinn Procedure Date: 07/01/2019 1:47 PM MRN: [id number] Endoscopist: Docia Chuck. Henrene Pastor , MD Age: 48 Referring MD:  Date of Birth: 1970-09-17 Gender: Female Account #: [id number] Procedure:                Colonoscopy Indications:              Colon cancer screening in patient at increased                            risk: Family history of colorectal cancer in                            multiple 2nd degree relatives. Reports 3 uncles.                            Previous examination September 2010 with                            hyperplastic polyps only Medicines:                Monitored Anesthesia Care Procedure:                Pre-Anesthesia Assessment:                           - Prior to the procedure, a History and Physical                            was performed, and patient medications and                            allergies were reviewed. The patient's tolerance of                            previous anesthesia was also reviewed. The risks                            and benefits of the procedure and the sedation                            options and risks were discussed with the patient.                            All questions were answered, and informed consent                            was obtained. Prior Anticoagulants: The patient has                            taken no previous anticoagulant or antiplatelet                            agents. ASA Grade Assessment: II - A patient with  mild systemic disease. After reviewing the risks                            and benefits, the patient was deemed in                            satisfactory condition to undergo the procedure.                           After obtaining informed consent, the colonoscope                            was passed under direct vision. Throughout the                            procedure, the patient's blood pressure, pulse, and                       oxygen saturations were monitored continuously. The                            Colonoscope was introduced through the anus and                            advanced to the the cecum, identified by                            appendiceal orifice and ileocecal valve. The                            ileocecal valve, appendiceal orifice, and rectum                            were photographed. The quality of the bowel                            preparation was excellent. The colonoscopy was                            performed without difficulty. The patient tolerated                            the procedure well. The bowel preparation used was                            SUPREP via split dose instruction. Scope In: 1:55:58 PM Scope Out: 2:11:27 PM Scope Withdrawal Time: 0 hours 12 minutes 25 seconds  Total Procedure Duration: 0 hours 15 minutes 29 seconds  Findings:                 The entire examined colon appeared normal on direct                            and retroflexion views. Internal hemorrhoids noted. Complications:  No immediate complications. Estimated blood loss:                            None. Estimated Blood Loss:     Estimated blood loss: none. Impression:               - The entire examined colon is normal on direct and                            retroflexion views. Internal hemorrhoids noted.                           - No specimens collected. Recommendation:           - Repeat colonoscopy in 10 years for screening                            purposes.                           - Patient has a contact number available for                            emergencies. The signs and symptoms of potential                            delayed complications were discussed with the                            patient. Return to normal activities tomorrow.                            Written discharge instructions were provided to the                             patient.                           - Resume previous diet.                           - Continue present medications. Docia Chuck. Henrene Pastor, MD 07/01/2019 2:18:48 PM This report has been signed electronically.

## 2019-07-01 NOTE — Progress Notes (Signed)
Report to PACU, RN, vss, BBS= Clear.  

## 2019-07-01 NOTE — Progress Notes (Signed)
Pt. Fully awake and alert at time of discharge.  Let pt. Rest on stretcher in her bay until she was alert.  (Pt. Had reported in admitting that she is slow to wake up after procedures.  RR time bore witness to that fact.)   Pt. Went to restroom, awake and comfortable.

## 2019-07-05 ENCOUNTER — Telehealth: Payer: Self-pay

## 2019-07-05 ENCOUNTER — Telehealth: Payer: Self-pay | Admitting: *Deleted

## 2019-07-05 NOTE — Telephone Encounter (Signed)
  Follow up Call-  Call back number 07/01/2019  Post procedure Call Back phone  # [phone]  Permission to leave phone message Yes  Some recent data might be hidden     Patient questions:  Do you have a fever, pain , or abdominal swelling? No. Pain Score  0 *  Have you tolerated food without any problems? Yes.    Have you been able to return to your normal activities? Yes.    Do you have any questions about your discharge instructions: Diet   No. Medications  No. Follow up visit  No.  Do you have questions or concerns about your Care? No.  Actions: * If pain score is 4 or above: 1. No action needed, pain <4.Have you developed a fever since your procedure? no  2.   Have you had an respiratory symptoms (SOB or cough) since your procedure? no  3.   Have you tested positive for COVID 19 since your procedure no  4.   Have you had any family members/close contacts diagnosed with the COVID 19 since your procedure?  no   If yes to any of these questions please route to Joylene John, RN and Alphonsa Gin, Therapist, sports.

## 2019-07-05 NOTE — Telephone Encounter (Signed)
  Follow up Call-  Call back number 07/01/2019  Post procedure Call Back phone  # [phone]  Permission to leave phone message Yes  Some recent data might be hidden     Patient questions:  Message left to call if necessary.

## 2019-07-07 DIAGNOSIS — R0602 Shortness of breath: Secondary | ICD-10-CM | POA: Diagnosis not present

## 2019-07-08 LAB — BASIC METABOLIC PANEL
BUN/Creatinine Ratio: 12 (ref 9–23)
BUN: 12 mg/dL (ref 6–24)
CO2: 25 mmol/L (ref 20–29)
Calcium: 9.8 mg/dL (ref 8.7–10.2)
Chloride: 102 mmol/L (ref 96–106)
Creatinine, Ser: 1.02 mg/dL — ABNORMAL HIGH (ref 0.57–1.00)
GFR calc Af Amer: 74 mL/min/{1.73_m2} (ref >59)
GFR calc non Af Amer: 64 mL/min/{1.73_m2} (ref >59)
Glucose: 106 mg/dL — ABNORMAL HIGH (ref 65–99)
Potassium: 3.5 mmol/L (ref 3.5–5.2)
Sodium: 142 mmol/L (ref 134–144)

## 2019-07-12 ENCOUNTER — Telehealth (HOSPITAL_COMMUNITY): Payer: Self-pay | Admitting: Emergency Medicine

## 2019-07-12 NOTE — Telephone Encounter (Signed)
Left message on voicemail with name and callback number Mykalah Saari RN Navigator Cardiac Imaging [facility] [phone] Office [phone] Cell  

## 2019-07-13 ENCOUNTER — Ambulatory Visit (HOSPITAL_COMMUNITY): Payer: BC Managed Care – PPO

## 2019-07-13 ENCOUNTER — Telehealth (HOSPITAL_COMMUNITY): Payer: Self-pay | Admitting: Emergency Medicine

## 2019-07-13 NOTE — Telephone Encounter (Signed)
Phone call by Clarise Cruz to patient to inform her that her appt for cardiac CTA was cancelled due to the scanner being down. Offered appt for [place] location but refused, would rather stay in [place]. Told patient to hold her PO metoprolol for next appt. Pt denies further questions  Marchia Bond RN Navigator Cardiac Imaging [facility] [phone] Office  [phone] Cell

## 2019-07-20 ENCOUNTER — Encounter (HOSPITAL_COMMUNITY): Payer: Self-pay | Admitting: Emergency Medicine

## 2019-07-20 ENCOUNTER — Other Ambulatory Visit: Payer: Self-pay

## 2019-07-20 ENCOUNTER — Emergency Department (HOSPITAL_COMMUNITY)
Admission: EM | Admit: 2019-07-20 | Discharge: 2019-07-20 | Disposition: A | Discharge status: Home / Self Care | Payer: BC Managed Care – PPO | Attending: Emergency Medicine | Admitting: Emergency Medicine

## 2019-07-20 ENCOUNTER — Telehealth: Payer: Self-pay | Admitting: Family Medicine

## 2019-07-20 ENCOUNTER — Emergency Department (HOSPITAL_COMMUNITY): Payer: BC Managed Care – PPO

## 2019-07-20 ENCOUNTER — Encounter: Payer: Self-pay | Admitting: Family Medicine

## 2019-07-20 ENCOUNTER — Ambulatory Visit (INDEPENDENT_AMBULATORY_CARE_PROVIDER_SITE_OTHER): Payer: BC Managed Care – PPO | Admitting: Family Medicine

## 2019-07-20 VITALS — Ht 64.5 in

## 2019-07-20 DIAGNOSIS — R112 Nausea with vomiting, unspecified: Secondary | ICD-10-CM

## 2019-07-20 DIAGNOSIS — R079 Chest pain, unspecified: Secondary | ICD-10-CM | POA: Diagnosis not present

## 2019-07-20 DIAGNOSIS — G43009 Migraine without aura, not intractable, without status migrainosus: Secondary | ICD-10-CM

## 2019-07-20 DIAGNOSIS — E860 Dehydration: Secondary | ICD-10-CM | POA: Diagnosis not present

## 2019-07-20 DIAGNOSIS — Z5321 Procedure and treatment not carried out due to patient leaving prior to being seen by health care provider: Secondary | ICD-10-CM | POA: Insufficient documentation

## 2019-07-20 LAB — BASIC METABOLIC PANEL
Anion gap: 14 (ref 5–15)
BUN: 13 mg/dL (ref 6–20)
CO2: 23 mmol/L (ref 22–32)
Calcium: 9.8 mg/dL (ref 8.9–10.3)
Chloride: 104 mmol/L (ref 98–111)
Creatinine, Ser: 0.94 mg/dL (ref 0.44–1.00)
GFR calc Af Amer: >60 mL/min (ref >60)
GFR calc non Af Amer: >60 mL/min (ref >60)
Glucose, Bld: 115 mg/dL — ABNORMAL HIGH (ref 70–99)
Potassium: 3.3 mmol/L — ABNORMAL LOW (ref 3.5–5.1)
Sodium: 141 mmol/L (ref 135–145)

## 2019-07-20 LAB — CBC
HCT: 48.4 % — ABNORMAL HIGH (ref 36.0–46.0)
Hemoglobin: 15.4 g/dL — ABNORMAL HIGH (ref 12.0–15.0)
MCH: 29.4 pg (ref 26.0–34.0)
MCHC: 31.8 g/dL (ref 30.0–36.0)
MCV: 92.5 fL (ref 80.0–100.0)
Platelets: 344 10*3/uL (ref 150–400)
RBC: 5.23 MIL/uL — ABNORMAL HIGH (ref 3.87–5.11)
RDW: 13.4 % (ref 11.5–15.5)
WBC: 10.1 10*3/uL (ref 4.0–10.5)
nRBC: 0 % (ref 0.0–0.2)

## 2019-07-20 LAB — I-STAT BETA HCG BLOOD, ED (MC, WL, AP ONLY): I-stat hCG, quantitative: <5 m[IU]/mL (ref <5)

## 2019-07-20 LAB — TROPONIN I (HIGH SENSITIVITY): Troponin I (High Sensitivity): 4 ng/L (ref <18)

## 2019-07-20 LAB — LIPASE, BLOOD: Lipase: 20 U/L (ref 11–51)

## 2019-07-20 MED ORDER — PROMETHAZINE HCL 12.5 MG PO TABS: 12.5000 mg | ORAL_TABLET | Freq: Three times a day (TID) | ORAL | 0 refills | Status: DC | PRN

## 2019-07-20 MED ORDER — ONDANSETRON 4 MG PO TBDP
4.0000 mg | ORAL_TABLET | Freq: Once | ORAL | Status: DC | PRN
  Filled 2019-07-20: qty 1

## 2019-07-20 MED ORDER — HYDROXYZINE PAMOATE 25 MG PO CAPS: 25.0000 mg | ORAL_CAPSULE | Freq: Two times a day (BID) | ORAL | 0 refills | Status: DC | PRN

## 2019-07-20 MED ORDER — SODIUM CHLORIDE 0.9% FLUSH: 3.0000 mL | Freq: Once | INTRAVENOUS | Status: DC

## 2019-07-20 MED ORDER — POTASSIUM CHLORIDE CRYS ER 20 MEQ PO TBCR: EXTENDED_RELEASE_TABLET | ORAL | 0 refills | Status: DC

## 2019-07-20 MED ORDER — ONDANSETRON HCL 4 MG PO TABS: 4.0000 mg | ORAL_TABLET | Freq: Three times a day (TID) | ORAL | 0 refills | Status: DC | PRN

## 2019-07-20 NOTE — Telephone Encounter (Signed)
Patient forgot to ask about the bell sound in her ear that she hears every time she moves her head. She would like to know if it is related to her migraine. Please call.

## 2019-07-20 NOTE — Patient Instructions (Signed)
As soon as you pick up the meds>> take a promethazine and vistaril, try to drink a glass of G2/water combination or Pedialyte and lay down in a dark room. Take Zofran about 4 hours after taking promethazine and start pushing the water and G2, hydrating with the goal of your urine to be very light clear yellow. Once tolerating liquids, start a liquid/bland diet for food and potassium supplement.  Continue replacing electrolytes also with G2 and hydrating with water. You can use the Phenergan every 8-12 hours if needed, between every 8 hours Zofran doses.  I would encourage you to take the Zofran scheduled every 8 hours for 3 days to allow for hydration and nutrients to be delivered without nausea vomiting. Vistaril can be used every 12 hours for tension/headache and has a mild antinausea component to it.  I am uncertain if you possibly had a food poisoning or a stomach bug versus an atypical migraine.  If unable to tolerate fluids despite the above, then you will need to go back to the emergency room to have IV fluids delivered to you and possibly further testing.

## 2019-07-20 NOTE — Telephone Encounter (Signed)
Patient was just seen today regarding migraine. She did not mention bell sound in her ear that she hears every time she moves her head. She wanted to know if this could be related to her migraine. PCP advised "Return in about 1 week (around 07/27/2019), or if symptoms worsen or fail to improve."  Please advise, thanks.

## 2019-07-20 NOTE — Progress Notes (Signed)
VIRTUAL VISIT VIA VIDEO  I connected with Shannon Quinn on 07/20/19 at  3:00 PM EST by a video enabled telemedicine application and verified that I am speaking with the correct person using two identifiers. Location patient: Home Location provider: [facility], Office Persons participating in the virtual visit: Patient, Dr. Raoul Pitch and R.Baker, LPN  I discussed the limitations of evaluation and management by telemedicine and the availability of in person appointments. The patient expressed understanding and agreed to proceed.   SUBJECTIVE Chief Complaint  Patient presents with  . Nausea    Pt has nausea and vomiting last night and went to ED. She was not given fluids and was told it was a 9 hr wait to get fluids. Was given Zofran under the tongue and she went home. Denies fever or SOB.   . Emesis    HPI: Shannon Quinn is a 48 y.o. female present for acute illness.  Patient reports Sunday evening she started to feel unwell and fatigued.  That evening she would vomit anytime she try to consume anything by mouth or fluids.  Monday morning she vomited upon awakening, and could not hold anything on her stomach.  She reports she vomited approximately 12 times in a day.  She has become dizzy, endorses light sensitivity and sound sensitivity.  She reports she is okay if she lays down with her eyes closed in a dark room.  She reports a odd sound she hears in her head " chinging" on occasions with eye or neck movement.  She denies fever, chills, cough, shortness of breath or bowel changes.  She endorses a headache.  She had a history of migraine headaches in the past usually 1-2 a year, but it typically feels different than what she is experiencing now.  She does not believe she consumed any under prepared meals or food that had went bad.  No one in her family is sick.  She has no known exposures to COVID-19.  She was seen in the emergency room last night and had a chest x-ray completed which did not  show any acute processes.  She also had a CBC-which appeared concentrated, CMP with low potassium, negative troponin and a negative pregnancy test.  She reports this afternoon she has been able to drink about a glass of water total.  ROS: See pertinent positives and negatives per HPI.  Patient Active Problem List   Diagnosis Date Noted  . Atypical chest pain 06/07/2019  . Left ear pain 06/01/2019  . Left upper quadrant pain 06/01/2019  . Essential hypertension 06/01/2019  . Family history of heart disease 06/01/2019  . Hot flashes due to menopause 12/16/2018  . Abnormality of right breast on screening mammogram 10/09/2018  . Impingement syndrome of right shoulder region 06/04/2018  . 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 Melanoma of skin ([place]) 03/26/2018  . Nasal turbinate hypertrophy 04/15/2017  . Eustachian tube dysfunction, bilateral 01/29/2017  . Right cervical radiculopathy 11/08/2015  . Anxiety and depression 03/26/2013  . Varicose veins 12/01/2012  . Hypothyroid 05/25/2012    Social History   Tobacco Use  . Smoking status: Never Smoker  . Smokeless tobacco: Never Used  Substance Use Topics  . Alcohol use: Yes    Alcohol/week: 0.0 standard drinks    Comment: rarely    Current Outpatient Medications:  .  fluticasone (FLONASE) 50 MCG/ACT nasal spray, Place 2 sprays into both nostrils daily., Disp: 16 g, Rfl: 11 .  hydrochlorothiazide (HYDRODIURIL) 25 MG  tablet, Take 1 tablet (25 mg total) by mouth daily., Disp: 90 tablet, Rfl: 1 .  ibuprofen (ADVIL) 200 MG tablet, Take 200 mg by mouth every 6 (six) hours as needed., Disp: , Rfl:  .  metoprolol tartrate (LOPRESSOR) 50 MG tablet, TAKE 1 TABLET 2 HOURS PRIOR TO YOUR CT, Disp: 1 tablet, Rfl: 0 .  Omega-3 1000 MG CAPS, Take 2,000 mg by mouth., Disp: , Rfl:  .  omeprazole (PRILOSEC) 40 MG capsule, Take 1 capsule (40 mg total) by mouth daily., Disp: 30 capsule, Rfl: 0 .  OVER THE COUNTER MEDICATION, , Disp: ,  Rfl:  .  venlafaxine XR (EFFEXOR-XR) 75 MG 24 hr capsule, Take 1 capsule (75 mg total) by mouth daily., Disp: 90 capsule, Rfl: 1  Allergies  Allergen Reactions  . Chlorhexidine Other (See Comments)    Red and purple burns and peeled skin. Cannot use surgical glue.  Pt states Betadine is safe and without reaction.  Marland Kitchen Neomycin     UNSPECIFIED REACTION   . Other Other (See Comments)    Body glue, turns skin blue  . Sulfasalazine Other (See Comments)  . Sulfonamide Derivatives     UNSPECIFIED REACTION   . Augmentin [Amoxicillin-Pot Clavulanate] Nausea And Vomiting    amox ok.   . Hydrocodone Nausea And Vomiting    Severe vomiting  . Oxycodone Nausea And Vomiting    Severe vomiting  . Prednisone Other (See Comments)    insomnia    OBJECTIVE: Ht 5' 4.5" (1.638 m)   LMP 08/23/2017   BMI 38.19 kg/m  Gen: Nontoxic in appearance.  Laying in bed. HENT: AT. [place].  Dry mucous membranes, dry lips. Eyes:Pupils Equal Round Reactive to light, Extraocular movements intact,  Conjunctiva without redness, discharge or icterus. Chest: Cough or shortness of breath not present Skin: No rashes, purpura or petechiae.  Neuro:  Alert. Oriented x3 -although had to think about what day it was. Psych: Normal affect, dress and demeanor. Normal speech. Normal thought content and judgment.  ASSESSMENT AND PLAN: [name] is a 48 y.o. female present for  Atypical migraine/Dehydration/Intractable vomiting with nausea, unspecified vomiting type Uncertain etiology of her symptoms, possibly could have been food poisoning which led to dehydration and headache versus atypical migraine versus viral GI illness.  Reviewed labs with her today and chest x-ray. Encouraged her to push hydration with G2 and water.  Goal is urination that is very light yellow to clear. Stop HCTZ until tolerating full p.o. Start K-Dur 20 mEq twice daily for 3 days once tolerating p.o. Phenergan 12.5 mg prescribed, start with Vistaril 25  mg which was prescribed as soon as she receives from pharmacy.  Drink a glass of water.  Lay down in a dark room. Start to push fluids G2 and water. Start Zofran 4 hours later.  Use Zofran every 8 hours for 2-3 days until tolerating full p.o.  May use Phenergan every 8-12 hours between Zofran doses if needed only. May use Vistaril every 12 hours if needed. Slowly advance diet to full clear liquids, then bland as tolerated. If unable to tolerate liquids will need to return back to emergency room for IV fluids and possibly further testing.  Patient understands directions fully.  > 25 minutes spent with patient, >50% of time spent face to face   No orders of the defined types were placed in this encounter.    Howard Pouch, DO 07/20/2019

## 2019-07-20 NOTE — ED Triage Notes (Signed)
Pt reports chest and abdominal pain X3weeks.  Pt has vomited numerous time today.  PCP told her to come and get checked out.  Pt reports "my nerves are causing me pain."

## 2019-07-21 NOTE — Telephone Encounter (Signed)
She actually did mention a ringing or a "chinging" sound in her ear when she moved her head.  I am uncertain of the cause of this ringing in her ear and would encourage her to follow-up in 1 to 2 weeks if the symptoms are not improved including the ringing.

## 2019-07-21 NOTE — Telephone Encounter (Signed)
Pt was called and detailed VM giving patient information

## 2019-07-22 DIAGNOSIS — D2261 Melanocytic nevi of right upper limb, including shoulder: Secondary | ICD-10-CM | POA: Diagnosis not present

## 2019-07-22 DIAGNOSIS — Z85820 Personal history of malignant melanoma of skin: Secondary | ICD-10-CM | POA: Diagnosis not present

## 2019-07-22 DIAGNOSIS — D1722 Benign lipomatous neoplasm of skin and subcutaneous tissue of left arm: Secondary | ICD-10-CM | POA: Diagnosis not present

## 2019-07-26 DIAGNOSIS — D1722 Benign lipomatous neoplasm of skin and subcutaneous tissue of left arm: Secondary | ICD-10-CM | POA: Diagnosis not present

## 2019-07-29 ENCOUNTER — Telehealth (HOSPITAL_COMMUNITY): Payer: Self-pay | Admitting: Emergency Medicine

## 2019-07-29 NOTE — Telephone Encounter (Signed)
Reaching out to patient to offer assistance regarding upcoming cardiac imaging study; pt verbalizes understanding of appt date/time, parking situation and where to check in, pre-test NPO status and medications ordered, and verified current allergies; name and call back number provided for further questions should they arise Mavryk Pino RN Navigator Cardiac Imaging [facility] [phone] office [phone] cell 

## 2019-07-30 ENCOUNTER — Other Ambulatory Visit: Payer: Self-pay

## 2019-07-30 ENCOUNTER — Ambulatory Visit (HOSPITAL_COMMUNITY)
Admission: RE | Admit: 2019-07-30 | Discharge: 2019-07-30 | Disposition: A | Discharge status: Home / Self Care | Payer: BC Managed Care – PPO | Source: Ambulatory Visit | Attending: Cardiovascular Disease | Admitting: Cardiovascular Disease

## 2019-07-30 ENCOUNTER — Encounter (INDEPENDENT_AMBULATORY_CARE_PROVIDER_SITE_OTHER): Payer: Self-pay

## 2019-07-30 DIAGNOSIS — R0602 Shortness of breath: Secondary | ICD-10-CM | POA: Insufficient documentation

## 2019-07-30 DIAGNOSIS — R0789 Other chest pain: Secondary | ICD-10-CM | POA: Diagnosis not present

## 2019-07-30 MED ORDER — NITROGLYCERIN 0.4 MG SL SUBL
0.8000 mg | SUBLINGUAL_TABLET | Freq: Once | SUBLINGUAL | Status: AC
  Administered 2019-07-30: 16:00:00 0.8 mg via SUBLINGUAL

## 2019-07-30 MED ORDER — IOHEXOL 350 MG/ML SOLN
80.0000 mL | Freq: Once | INTRAVENOUS | Status: AC | PRN
  Administered 2019-07-30: 16:00:00 80 mL via INTRAVENOUS

## 2019-07-30 MED ORDER — NITROGLYCERIN 0.4 MG SL SUBL
SUBLINGUAL_TABLET | SUBLINGUAL | Status: AC
  Filled 2019-07-30: qty 2

## 2019-08-10 ENCOUNTER — Encounter: Payer: Self-pay | Admitting: Family Medicine

## 2019-08-10 DIAGNOSIS — I2510 Atherosclerotic heart disease of native coronary artery without angina pectoris: Secondary | ICD-10-CM | POA: Insufficient documentation

## 2019-08-11 ENCOUNTER — Other Ambulatory Visit: Payer: Self-pay

## 2019-08-11 DIAGNOSIS — I2510 Atherosclerotic heart disease of native coronary artery without angina pectoris: Secondary | ICD-10-CM

## 2019-08-11 DIAGNOSIS — E7800 Pure hypercholesterolemia, unspecified: Secondary | ICD-10-CM

## 2019-08-11 DIAGNOSIS — I10 Essential (primary) hypertension: Secondary | ICD-10-CM

## 2019-08-11 IMAGING — MG MM PLC BREAST LOC DEV 1ST LESION INC*R*
8 series · 8 of 8 positions shown · non-contrast
Comparison: Previous exam(s).

CLINICAL DATA: New area of architectural distortion in the upper
inner left breast recently biopsied under 3D stereotactic guidance
with discordant benign results. Surgical excision was recommended.
Pre excision localization.

EXAM:
MAMMOGRAPHIC GUIDED RADIOACTIVE SEED LOCALIZATION OF THE RIGHT
BREAST

[R CC (1 of 3)]
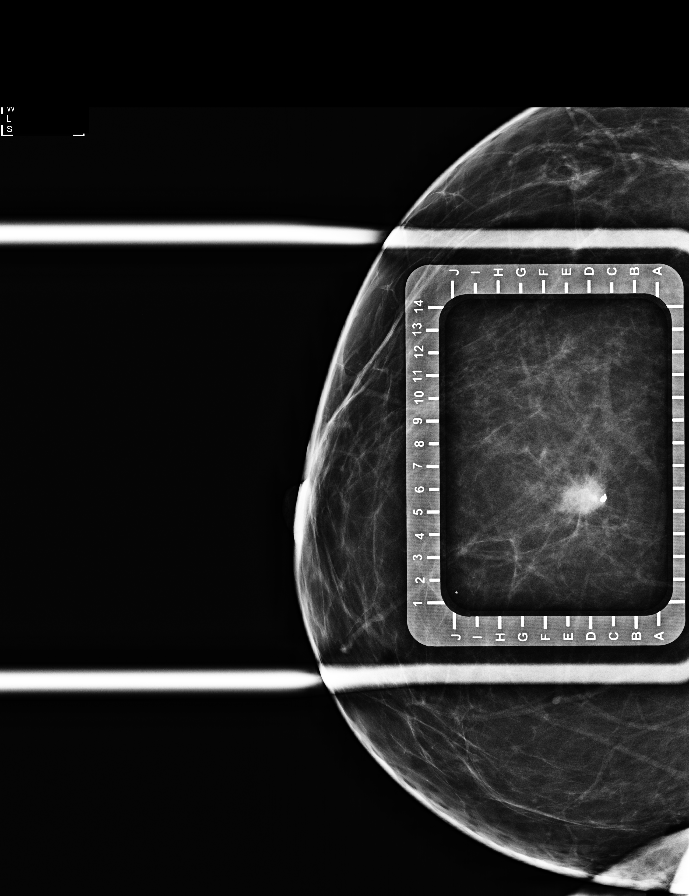

[R CC (2 of 3)]
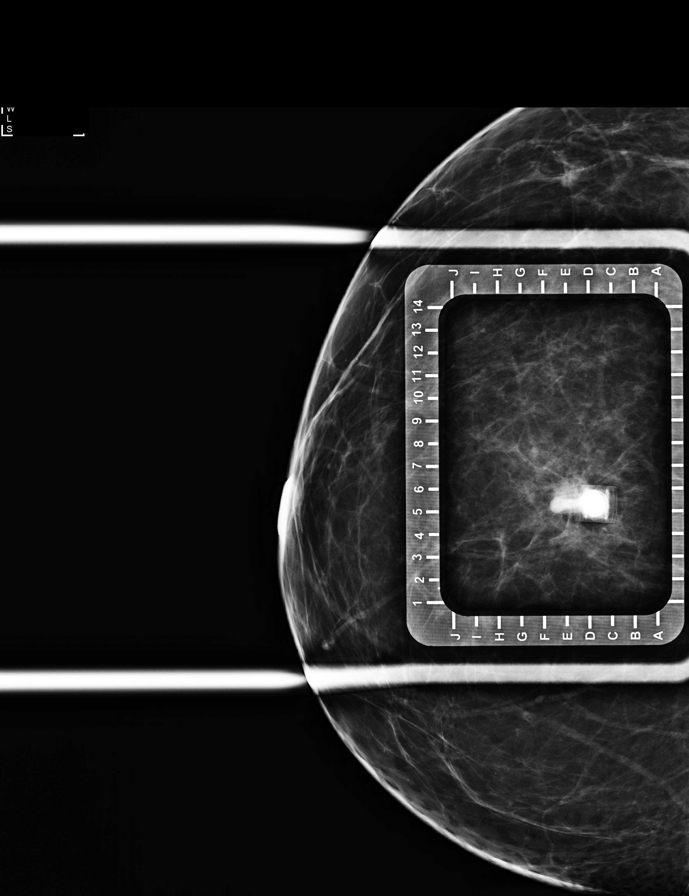

[R ML (1 of 5)]
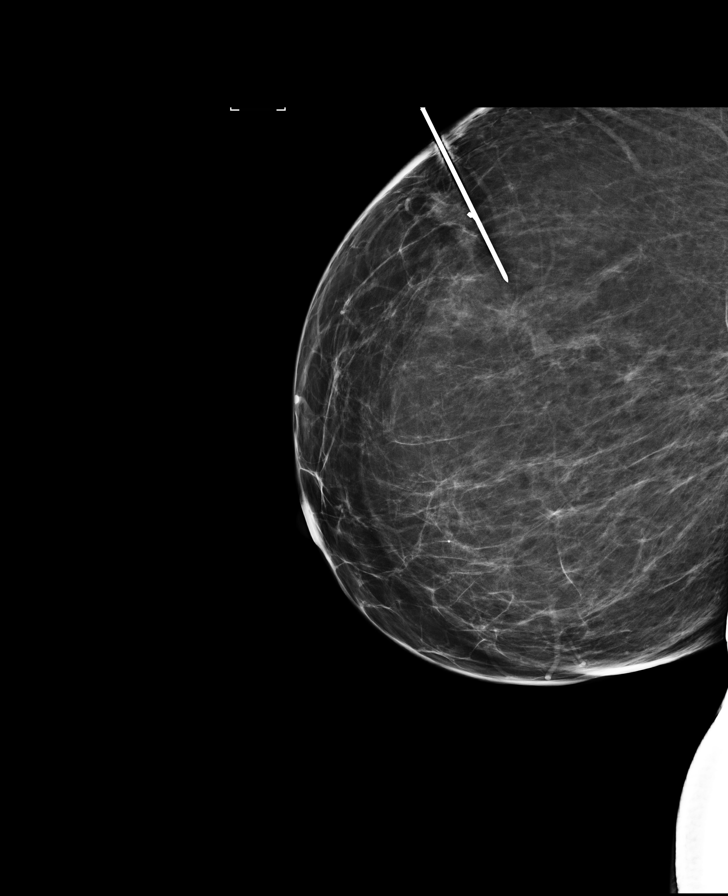

[R ML (2 of 5)]
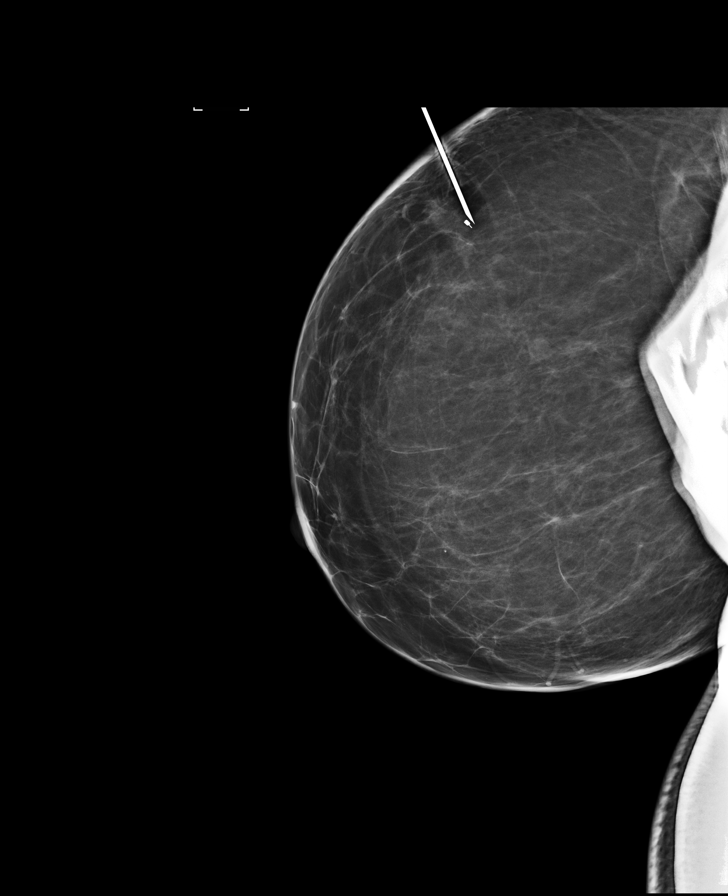

[R ML (3 of 5)]
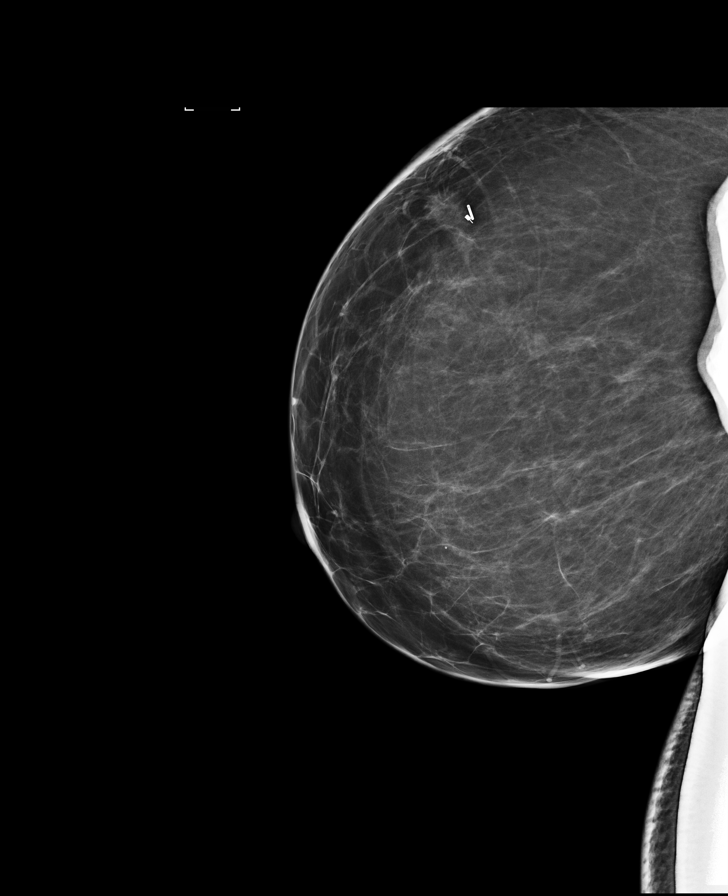

[R CC (3 of 3)]
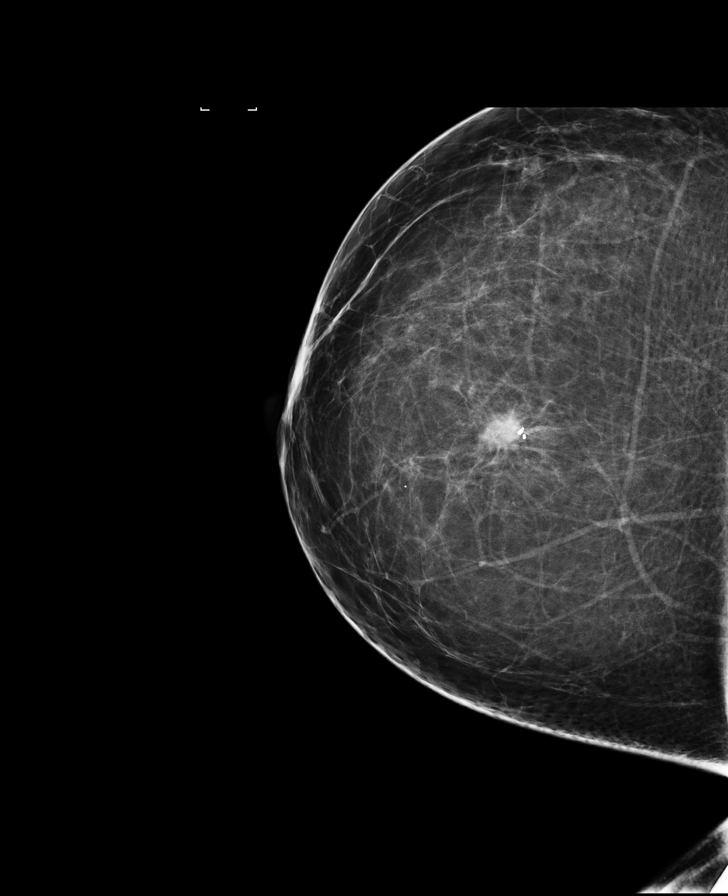

[R ML (4 of 5)]
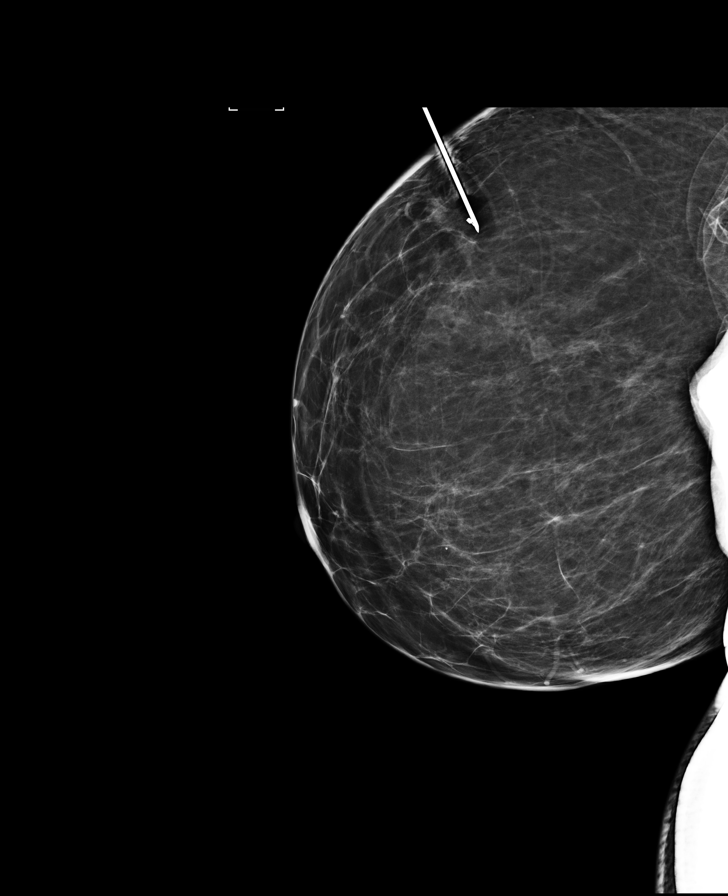

[R ML (5 of 5)]
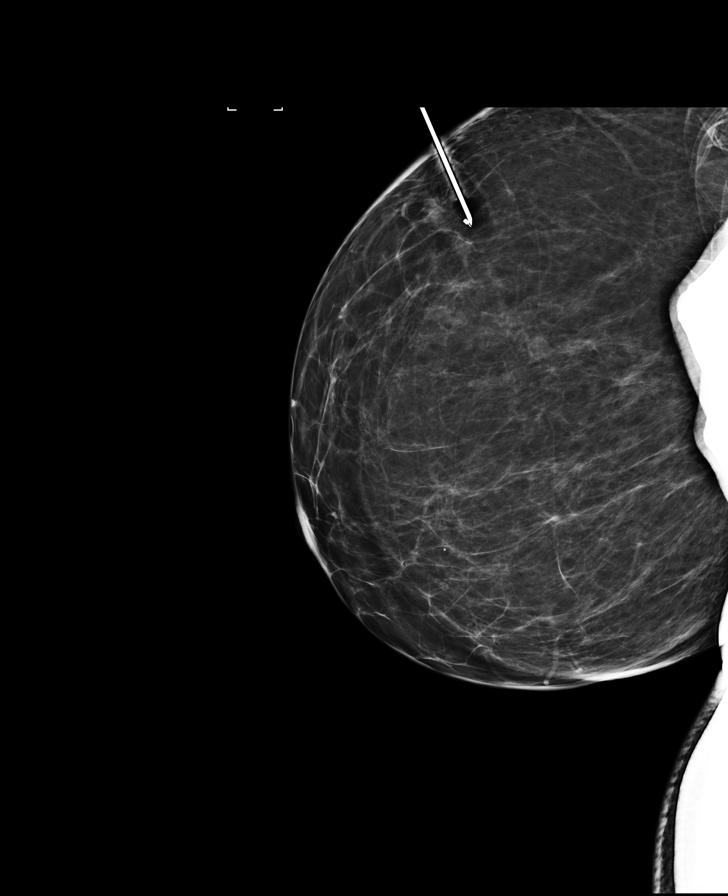

[8 of 8 positions shown; findings below may reference images not displayed]

FINDINGS: Patient presents for radioactive seed localization prior to right
breast excision. I met with the patient and we discussed the
procedure of seed localization including benefits and alternatives.
We discussed the high likelihood of a successful procedure. We
discussed the risks of the procedure including infection, bleeding,
tissue injury and further surgery. We discussed the low dose of
radioactivity involved in the procedure. Informed, written consent
was given.

The usual time-out protocol was performed immediately prior to the
procedure.

Using mammographic guidance, sterile technique, 1% lidocaine and an
U-LJB radioactive seed, the recently placed coil shaped biopsy
marker clip in the upper right breast was localized using a cephalad
approach. The follow-up mammogram images confirm the seed in the
expected location and were marked for Dr. Shemeikka.

Follow-up survey of the patient confirms presence of the radioactive
seed.

Order number of U-LJB seed:  [id number].

Total activity:  0.249 mCi reference Date: 09/14/2018

The patient tolerated the procedure well and was released from the
[REDACTED]. She was given instructions regarding seed removal.
IMPRESSION: Radioactive seed localization right breast. No apparent
complications.

## 2019-08-11 NOTE — Progress Notes (Signed)
lipid

## 2019-09-02 DIAGNOSIS — M5032 Other cervical disc degeneration, mid-cervical region, unspecified level: Secondary | ICD-10-CM | POA: Diagnosis not present

## 2019-09-02 DIAGNOSIS — M25512 Pain in left shoulder: Secondary | ICD-10-CM | POA: Diagnosis not present

## 2019-09-02 DIAGNOSIS — M25511 Pain in right shoulder: Secondary | ICD-10-CM | POA: Diagnosis not present

## 2019-09-02 DIAGNOSIS — M542 Cervicalgia: Secondary | ICD-10-CM | POA: Diagnosis not present

## 2019-09-07 DIAGNOSIS — M542 Cervicalgia: Secondary | ICD-10-CM | POA: Diagnosis not present

## 2019-09-10 ENCOUNTER — Other Ambulatory Visit: Payer: Self-pay | Admitting: Oncology

## 2019-09-10 DIAGNOSIS — Z9889 Other specified postprocedural states: Secondary | ICD-10-CM | POA: Diagnosis not present

## 2019-09-10 DIAGNOSIS — C779 Secondary and unspecified malignant neoplasm of lymph node, unspecified: Secondary | ICD-10-CM | POA: Diagnosis not present

## 2019-09-10 DIAGNOSIS — C4359 Malignant melanoma of other part of trunk: Secondary | ICD-10-CM | POA: Diagnosis not present

## 2019-09-10 DIAGNOSIS — C439 Malignant melanoma of skin, unspecified: Secondary | ICD-10-CM | POA: Diagnosis not present

## 2019-09-14 ENCOUNTER — Other Ambulatory Visit: Payer: Self-pay | Admitting: General Surgery

## 2019-09-14 DIAGNOSIS — M542 Cervicalgia: Secondary | ICD-10-CM | POA: Diagnosis not present

## 2019-09-14 DIAGNOSIS — N6310 Unspecified lump in the right breast, unspecified quadrant: Secondary | ICD-10-CM

## 2019-09-17 ENCOUNTER — Telehealth: Payer: BC Managed Care – PPO | Admitting: Cardiovascular Disease

## 2019-09-20 ENCOUNTER — Telehealth: Payer: Self-pay | Admitting: *Deleted

## 2019-09-20 NOTE — Telephone Encounter (Signed)
Dr Jamse Belfast with [facility] would like to discuss Ms Munos. She can be reached at [phone]

## 2019-09-21 DIAGNOSIS — M542 Cervicalgia: Secondary | ICD-10-CM | POA: Diagnosis not present

## 2019-09-24 NOTE — Progress Notes (Signed)
Cardiology Clinic Note   Patient Name: Shannon Quinn Date of Encounter: 09/27/2019  Primary Care Provider:  Ma Hillock, DO Primary Cardiologist:  [name], MD  Patient Profile    Shannon Quinn 49 year old female presents today for follow-up evaluation of her essential hypertension, coronary atherosclerosis, and atypical chest pain.  Past Medical History    Past Medical History:  Diagnosis Date  . Abnormality of right breast on screening mammogram 10/09/2018  . Allergy   . Colon polyps 2011   3 polpys removed  . Complication of anesthesia    "severe Reaction" "Physically ill"   . Deviated septum   . Family history of adverse reaction to anesthesia    violent when waking up from anesthesia - very sick  . Hypertension   . Hypoglycemic syndrome 1996   during pregnancy  . Hypothyroidism   . Lymphedema of right arm   . Melanoma ([place])    03/10/2018  . PONV (postoperative nausea and vomiting)   . Shoulder injury    right - side happened from last surgery  . Thyroid disease   . Varicose vein of leg    Past Surgical History:  Procedure Laterality Date  . ABDOMINAL HYSTERECTOMY    . BREAST LUMPECTOMY WITH RADIOACTIVE SEED LOCALIZATION Right 10/09/2018   Procedure: RIGHT BREAST LUMPECTOMY WITH RADIOACTIVE SEED LOCALIZATION ERAS PATHWAY;  Surgeon: Fanny Skates, MD;  Location: [facility];  Service: General;  Laterality: Right;  . COLONOSCOPY     polyps  . COLONOSCOPY    . LAPAROSCOPIC TOTAL HYSTERECTOMY  09/04/2017   Dr. Gaetano Net for endometriosis  . MELANOMA EXCISION WITH SENTINEL LYMPH NODE BIOPSY Left 04/02/2018   Procedure: INJECT BLUE DYE MELANOMA LEFT BACK WITH  WIDE LOCAL EXCISION MELANOMA LEFT BACK BILATERAL AXILLARY DEEP SENTINEL LYMPH NODE BIOPSY;  Surgeon: Fanny Skates, MD;  Location: [facility];  Service: General;  Laterality: Left;  . TUBAL LIGATION    . WISDOM TOOTH EXTRACTION      Allergies  Allergies  Allergen  Reactions  . Chlorhexidine Other (See Comments)    Red and purple burns and peeled skin. Cannot use surgical glue.  Pt states Betadine is safe and without reaction.  Marland Kitchen Neomycin     UNSPECIFIED REACTION   . Other Other (See Comments)    Body glue, turns skin blue  . Sulfasalazine Other (See Comments)  . Sulfonamide Derivatives     UNSPECIFIED REACTION   . Augmentin [Amoxicillin-Pot Clavulanate] Nausea And Vomiting    amox ok.   . Hydrocodone Nausea And Vomiting    Severe vomiting  . Oxycodone Nausea And Vomiting    Severe vomiting  . Prednisone Other (See Comments)    insomnia    History of Present Illness    Ms. Polishchuk has a past medical history of essential hypertension, anxiety, atypical chest pain, and coronary atherosclerosis.  She presented to the cardiology clinic 05/2019 at the request of her PCP.  She had been experiencing left upper quadrant abdominal pain that would radiate to her back.  She had also had an episode of right jaw pain that had occurred at the same time as her LUQ pain.  This was also associated with dizziness.  All of her symptoms were  concerning for ischemia.  Dr. Oval Linsey proceeded to evaluate with CT-A which showed nonobstructive calcified plaque noted in the proximal LAD less than 25%.  She was noted to have a coronary calcium score of 75.7, 97 percentile for age  and sex matched control, normal coronary origin with right dominance.  During that time she stated she was less active due to the COVID-19 pandemic.  She noticed increased work of breathing with increased activity however, her breathing returned to normal with normal activities.  She admitted she had gained weight since her hysterectomy and was in the process of adopting a Mediterranean style diet.  She also indicated that her father had a heart attack at age 30.  She presents the clinic today and states she has been following a Mediterranean style diet and exercising at least 30 minutes a day on her  elliptical.  She has not had any further episodes of chest pain or pressure since her initial concern.  She does state she has some shortness of breath with increased physical activities however, she does not notice any shortness of breath with her 30-minute elliptical bouts of exercise.  She states she is eating more rich foods over the last week with Valentine's Day but as a whole in the last several months has been eating better.  Her blood pressure initially today is 155/94 on recheck it is 128/76.  I will give her the salty 6 sheet, have her increase fiber in her diet, repeat her lipid profile, and have her follow-up based on her results.  Today she denies chest pain, shortness of breath, lower extremity edema, fatigue, palpitations, melena, hematuria, hemoptysis, diaphoresis, orthopnea, and PND.     Home Medications    Prior to Admission medications   Medication Sig Start Date End Date Taking? Authorizing Provider  fluticasone (FLONASE) 50 MCG/ACT nasal spray Place 2 sprays into both nostrils daily. 06/01/19   Kuneff, [name], DO  hydrochlorothiazide (HYDRODIURIL) 25 MG tablet Take 1 tablet (25 mg total) by mouth daily. 06/02/19   Kuneff, [name], DO  hydrOXYzine (VISTARIL) 25 MG capsule Take 1 capsule (25 mg total) by mouth 2 (two) times daily as needed. 07/20/19   Kuneff, [name], DO  ibuprofen (ADVIL) 200 MG tablet Take 200 mg by mouth every 6 (six) hours as needed.    [provider]  metoprolol tartrate (LOPRESSOR) 50 MG tablet TAKE 1 TABLET 2 HOURS PRIOR TO YOUR CT 06/07/19   [name], MD  Omega-3 1000 MG CAPS Take 2,000 mg by mouth.    [provider]  omeprazole (PRILOSEC) 40 MG capsule Take 1 capsule (40 mg total) by mouth daily. 06/02/19   Kuneff, [name], DO  ondansetron (ZOFRAN) 4 MG tablet Take 1 tablet (4 mg total) by mouth every 8 (eight) hours as needed for nausea or vomiting. 07/20/19   Kuneff, [name], DO  OVER THE COUNTER MEDICATION     [provider]  potassium chloride SA (KLOR-CON) 20 MEQ tablet Once able to tolerate PO, take 1 tab BID for 3 days 07/20/19   Kuneff, [name], DO  promethazine (PHENERGAN) 12.5 MG tablet Take 1 tablet (12.5 mg total) by mouth every 8 (eight) hours as needed for nausea or vomiting. 07/20/19   Kuneff, [name], DO  venlafaxine XR (EFFEXOR-XR) 75 MG 24 hr capsule Take 1 capsule (75 mg total) by mouth daily. 06/01/19   Ma Hillock, DO    Family History    Family History  Problem Relation Age of Onset  . Arthritis Mother   . Alcohol abuse Father   . Arthritis Father   . Hyperlipidemia Father   . Stroke Father        2012  .  Depression Father   . Diabetes Father   . Heart disease Father 32  . Heart failure Father   . Heart attack Father   . Diabetes Sister   . Dementia Maternal Grandmother   . Heart disease Maternal Grandfather 42  . CAD Maternal Grandfather   . Diabetes Paternal Grandmother   . COPD Paternal Grandfather        emphysema  . Cancer Paternal Uncle        breast cancer  . Colon cancer Paternal Uncle   . CAD Maternal Uncle   . Colon cancer Maternal Uncle   . CAD Paternal Aunt   . Cancer Maternal Uncle   . Esophageal cancer Neg Hx   . Stomach cancer Neg Hx   . Rectal cancer Neg Hx    She indicated that her mother is alive. She indicated that her father is alive. She indicated that her sister is alive. She indicated that her maternal grandmother is deceased. She indicated that her maternal grandfather is deceased. She indicated that her paternal grandmother is deceased. She indicated that her paternal grandfather is deceased. She indicated that the status of her paternal aunt is unknown. She indicated that the status of her paternal uncle is unknown. She indicated that the status of her neg hx is unknown.  Social History    Social History   Socioeconomic History  . Marital status: Married    Spouse name: Not on file  . Number of children: 2  . Years of  education: Not on file  . Highest education level: Not on file  Occupational History  . Not on file  Tobacco Use  . Smoking status: Never Smoker  . Smokeless tobacco: Never Used  Substance and Sexual Activity  . Alcohol use: Yes    Alcohol/week: 0.0 standard drinks    Comment: rarely  . Drug use: No  . Sexual activity: Yes    Partners: Male    Birth control/protection: Surgical  Other Topics Concern  . Not on file  Social History Narrative   Regular exercise: walking 1 mile per day   Caffeine use: soda drinker   Lives with husband and 2 children in a one story home.     Works for [employer].  She works in Science writer   Education: associates degree.     Social Determinants of Health   Financial Resource Strain:   . Difficulty of Paying Living Expenses: Not on file  Food Insecurity:   . Worried About Charity fundraiser in the Last Year: Not on file  . Ran Out of Food in the Last Year: Not on file  Transportation Needs:   . Lack of Transportation (Medical): Not on file  . Lack of Transportation (Non-Medical): Not on file  Physical Activity:   . Days of Exercise per Week: Not on file  . Minutes of Exercise per Session: Not on file  Stress:   . Feeling of Stress : Not on file  Social Connections:   . Frequency of Communication with Friends and Family: Not on file  . Frequency of Social Gatherings with Friends and Family: Not on file  . Attends Religious Services: Not on file  . Active Member of Clubs or Organizations: Not on file  . Attends Archivist Meetings: Not on file  . Marital Status: Not on file  Intimate Partner Violence:   . Fear of Current or Ex-Partner: Not on file  . Emotionally  Abused: Not on file  . Physically Abused: Not on file  . Sexually Abused: Not on file     Review of Systems    General:  No chills, fever, night sweats or weight changes.  Cardiovascular:  No chest pain, dyspnea on exertion, edema,  orthopnea, palpitations, paroxysmal nocturnal dyspnea. Dermatological: No rash, lesions/masses Respiratory: No cough, dyspnea Urologic: No hematuria, dysuria Abdominal:   No nausea, vomiting, diarrhea, bright red blood per rectum, melena, or hematemesis Neurologic:  No visual changes, wkns, changes in mental status. All other systems reviewed and are otherwise negative except as noted above.  Physical Exam    VS:  BP 128/76   Pulse 67   Temp (!) 97.2 F (36.2 C)   Ht 5\' 4"  (1.626 m)   Wt 227 lb 3.2 oz (103.1 kg)   LMP 08/23/2017   SpO2 97%   BMI 39.00 kg/m  , BMI Body mass index is 39 kg/m. GEN: Well nourished, well developed, in no acute distress. HEENT: normal. Neck: Supple, no JVD, carotid bruits, or masses. Cardiac: RRR, no murmurs, rubs, or gallops. No clubbing, cyanosis, edema.  Radials/DP/PT 2+ and equal bilaterally.  Respiratory:  Respirations regular and unlabored, clear to auscultation bilaterally. GI: Soft, nontender, nondistended, BS + x 4. MS: no deformity or atrophy. Skin: warm and dry, no rash. Neuro:  Strength and sensation are intact. Psych: Normal affect.  Accessory Clinical Findings    ECG personally reviewed by me today-none today.  EKG 07/20/2019 Normal sinus rhythm rightward axis deviation anterior infarct undetermined age 68 bpm  Coronary CTA 07/30/2019 Aorta: Normal size. Ascending aorta 3.0 cm. No calcifications. No dissection.  Aortic Valve:  Trileaflet.  No calcifications.  Coronary Arteries:  Normal coronary origin.  Right dominance.  RCA is a large dominant artery that gives rise to PDA and PLVB. There is no plaque.  Left main is a large artery that gives rise to LAD and LCX arteries.  LAD is a large vessel that has minimal (<25%) calcified plaque proximally. There are two small diagonals.  LCX is a non-dominant artery that gives rise to a small OM1 and large OM2 without plaque.  Other findings:  Normal pulmonary vein  drainage into the left atrium.  Normal let atrial appendage without a thrombus.  Normal size of the pulmonary artery.  IMPRESSION: 1. Coronary calcium score of 75.7. This was 97th percentile for age and sex matched control.  2. Normal coronary origin with right dominance.  3. Non-obstructive, calcified plaque noted in the proximal LAD.   Assessment & Plan   1.  Coronary artery disease-no chest pain today.  Continues on her Mediterranean diet and is trying to increase her physical activity.  Coronary CTA 07/30/2019 showed nonobstructive coronary artery disease. Start aspirin 81 mg daily Heart healthy low-sodium diet-salty 6 Increase physical activity as tolerated  Hyperlipidemia-06/01/2019: Cholesterol 197; HDL 50; LDL Cholesterol (Calc) 127; Triglycerides 95 Heart healthy low-sodium high-fiber diet Increase physical activity as tolerated Repeat lipid panel   Essential hypertension-BP today 128/76.  Continue HCTZ 25 mg daily Heart healthy low-sodium diet Increase physical activity as tolerated  Obesity-weight today 227 from 226 on 07/01/2019 Continue weight loss  Disposition: Follow-up with Dr. Oval Linsey in 1 year   Jossie Ng. [facility] [place] [address] Office [phone] Fax [phone]

## 2019-09-27 ENCOUNTER — Other Ambulatory Visit: Payer: Self-pay

## 2019-09-27 ENCOUNTER — Ambulatory Visit (INDEPENDENT_AMBULATORY_CARE_PROVIDER_SITE_OTHER): Payer: BC Managed Care – PPO | Admitting: General Practice

## 2019-09-27 ENCOUNTER — Encounter: Payer: Self-pay | Admitting: General Practice

## 2019-09-27 VITALS — BP 128/76 | HR 67 | Temp 97.2°F | Ht 64.0 in | Wt 227.2 lb

## 2019-09-27 DIAGNOSIS — I2510 Atherosclerotic heart disease of native coronary artery without angina pectoris: Secondary | ICD-10-CM | POA: Diagnosis not present

## 2019-09-27 DIAGNOSIS — E669 Obesity, unspecified: Secondary | ICD-10-CM

## 2019-09-27 DIAGNOSIS — E7800 Pure hypercholesterolemia, unspecified: Secondary | ICD-10-CM | POA: Diagnosis not present

## 2019-09-27 DIAGNOSIS — I10 Essential (primary) hypertension: Secondary | ICD-10-CM

## 2019-09-27 DIAGNOSIS — Z79899 Other long term (current) drug therapy: Secondary | ICD-10-CM | POA: Diagnosis not present

## 2019-09-27 MED ORDER — ASPIRIN EC 81 MG PO TBEC: 81.0000 mg | DELAYED_RELEASE_TABLET | Freq: Every day | ORAL | 3 refills | Status: AC

## 2019-09-27 NOTE — Patient Instructions (Addendum)
MEDICATIONS: START ASPIRIN 81MG   Labwork: FASTING LIPID NEXT WEEK HERE IN OUR OFFICE AT [facility]   You will need to fast. DO NOT EAT OR DRINK PAST MIDNIGHT.       If you have labs (blood work) drawn today and your tests are completely normal, you will receive your results only by: Marland Kitchen MyChart Message (if you have MyChart) OR A paper copy in the mail If you have any lab test that is abnormal or we need to change your treatment, we will call you to review these results. You may go to any [facility] lab that is convenient for you however, we do have a lab in our office that is able to assist you. You do NOT need an appointment for our lab. Once in our office in our office lobby there is a podium where you sign-in and ring the doorbell to alert [name] that you are here. Lab is open 8:00am and closes at 4:00pm; closes for lunch from 12:45 - 1:45pm. PLEASE BRING A COPY OF YOUR INSURANCE CARD WITH YOU.  Special Instructions: PLEASE READ AND FOLLOW SALTY 6 ATTACHED  Reduce your risk of getting COVID-19 With your heart disease it is especially important for people at increased risk of severe illness from COVID-19, and those who live with them, to protect themselves from getting COVID-19. The best way to protect yourself and to help reduce the spread of the virus that causes COVID-19 is to: Marland Kitchen Limit your interactions with other people as much as possible. . Take precautions to prevent getting COVID-19 when you do interact with others. If you start feeling sick and think you may have COVID-19, get in touch with your healthcare provider within 24 hours.  Follow-Up: 12 months Please call our office 2 months in advance, [phone] to schedule this FEB 2022 appointment. In Person You may see [name], MD or one of the following Advanced Practice Providers on your designated Care Team:  Coletta Memos, [place], PA-C  [place], [place].    At [facility], you and your health needs are our priority.   As part of our continuing mission to provide you with exceptional heart care, we have created designated Provider Care Teams.  These Care Teams include your primary Cardiologist (physician) and Advanced Practice Providers (APPs -  Physician Assistants and Nurse Practitioners) who all work together to provide you with the care you need, when you need it.  Thank you for choosing [facility] at [facility]!!

## 2019-10-08 DIAGNOSIS — Z136 Encounter for screening for cardiovascular disorders: Secondary | ICD-10-CM | POA: Diagnosis not present

## 2019-10-08 DIAGNOSIS — Z13220 Encounter for screening for lipoid disorders: Secondary | ICD-10-CM | POA: Diagnosis not present

## 2019-10-08 DIAGNOSIS — Z713 Dietary counseling and surveillance: Secondary | ICD-10-CM | POA: Diagnosis not present

## 2019-10-08 DIAGNOSIS — I10 Essential (primary) hypertension: Secondary | ICD-10-CM | POA: Diagnosis not present

## 2019-10-08 DIAGNOSIS — E7800 Pure hypercholesterolemia, unspecified: Secondary | ICD-10-CM | POA: Diagnosis not present

## 2019-10-08 DIAGNOSIS — Z79899 Other long term (current) drug therapy: Secondary | ICD-10-CM | POA: Diagnosis not present

## 2019-10-08 LAB — LIPID PANEL
Chol/HDL Ratio: 2.6 ratio (ref 0.0–4.4)
Cholesterol, Total: 191 mg/dL (ref 100–199)
HDL: 73 mg/dL (ref >39)
LDL Chol Calc (NIH): 106 mg/dL — ABNORMAL HIGH (ref 0–99)
Triglycerides: 64 mg/dL (ref 0–149)
VLDL Cholesterol Cal: 12 mg/dL (ref 5–40)

## 2019-10-11 ENCOUNTER — Encounter: Payer: Self-pay | Admitting: Family Medicine

## 2019-10-11 ENCOUNTER — Ambulatory Visit (INDEPENDENT_AMBULATORY_CARE_PROVIDER_SITE_OTHER): Payer: BC Managed Care – PPO | Admitting: Family Medicine

## 2019-10-11 ENCOUNTER — Other Ambulatory Visit: Payer: Self-pay

## 2019-10-11 VITALS — BP 134/79 | HR 77 | Temp 97.5°F | Resp 16 | Ht 64.5 in | Wt 221.4 lb

## 2019-10-11 DIAGNOSIS — G43809 Other migraine, not intractable, without status migrainosus: Secondary | ICD-10-CM

## 2019-10-11 MED ORDER — HYDROXYZINE PAMOATE 25 MG PO CAPS: 25.0000 mg | ORAL_CAPSULE | Freq: Three times a day (TID) | ORAL | 0 refills | Status: DC | PRN

## 2019-10-11 MED ORDER — RIZATRIPTAN BENZOATE 5 MG PO TABS: 5.0000 mg | ORAL_TABLET | ORAL | 0 refills | Status: DC | PRN

## 2019-10-11 MED ORDER — DIPHENHYDRAMINE HCL 50 MG/ML IJ SOLN
25.0000 mg | Freq: Once | INTRAMUSCULAR | Status: AC
  Administered 2019-10-11: 17:00:00 25 mg via INTRAMUSCULAR

## 2019-10-11 MED ORDER — KETOROLAC TROMETHAMINE 60 MG/2ML IM SOLN
60.0000 mg | Freq: Once | INTRAMUSCULAR | Status: AC
  Administered 2019-10-11: 60 mg via INTRAMUSCULAR

## 2019-10-11 MED ORDER — ONDANSETRON HCL 4 MG/2ML IJ SOLN
4.0000 mg | Freq: Once | INTRAMUSCULAR | Status: AC
  Administered 2019-10-11: 4 mg via INTRAMUSCULAR

## 2019-10-11 NOTE — Patient Instructions (Addendum)
Start b12 1000 mg a day.  Start magnesium supplement. Daily supplement of the above can help prevent headaches.   I called in maxalt you take at the onset of headache. Once.  Hydroxyzine prescribed. Take at onset of headache as well>> may make you sleepy.  Today:  Toradol/benadryl/zofran cocktail.    Migraine Headache A migraine headache is a very strong throbbing pain on one side or both sides of your head. This type of headache can also cause other symptoms. It can last from 4 hours to 3 days. Talk with your doctor about what things may bring on (trigger) this condition. What are the causes? The exact cause of this condition is not known. This condition may be triggered or caused by:  Drinking alcohol.  Smoking.  Taking medicines, such as: ? Medicine used to treat chest pain (nitroglycerin). ? Birth control pills. ? Estrogen. ? Some blood pressure medicines.  Eating or drinking certain products.  Doing physical activity. Other things that may trigger a migraine headache include:  Having a menstrual period.  Pregnancy.  Hunger.  Stress.  Not getting enough sleep or getting too much sleep.  Weather changes.  Tiredness (fatigue). What increases the risk?  Being 22-85 years old.  Being female.  Having a family history of migraine headaches.  Being Caucasian.  Having depression or anxiety.  Being very overweight. What are the signs or symptoms?  A throbbing pain. This pain may: ? Happen in any area of the head, such as on one side or both sides. ? Make it hard to do daily activities. ? Get worse with physical activity. ? Get worse around bright lights or loud noises.  Other symptoms may include: ? Feeling sick to your stomach (nauseous). ? Vomiting. ? Dizziness. ? Being sensitive to bright lights, loud noises, or smells.  Before you get a migraine headache, you may get warning signs (an aura). An aura may include: ? Seeing flashing lights or having  blind spots. ? Seeing bright spots, halos, or zigzag lines. ? Having tunnel vision or blurred vision. ? Having numbness or a tingling feeling. ? Having trouble talking. ? Having weak muscles.  Some people have symptoms after a migraine headache (postdromal phase), such as: ? Tiredness. ? Trouble thinking (concentrating). How is this treated?  Taking medicines that: ? Relieve pain. ? Relieve the feeling of being sick to your stomach. ? Prevent migraine headaches.  Treatment may also include: ? Having acupuncture. ? Avoiding foods that bring on migraine headaches. ? Learning ways to control your body functions (biofeedback). ? Therapy to help you know and deal with negative thoughts (cognitive behavioral therapy). Follow these instructions at home: Medicines  Take over-the-counter and prescription medicines only as told by your doctor.  Ask your doctor if the medicine prescribed to you: ? Requires you to avoid driving or using heavy machinery. ? Can cause trouble pooping (constipation). You may need to take these steps to prevent or treat trouble pooping:  Drink enough fluid to keep your pee (urine) pale yellow.  Take over-the-counter or prescription medicines.  Eat foods that are high in fiber. These include beans, whole grains, and fresh fruits and vegetables.  Limit foods that are high in fat and sugar. These include fried or sweet foods. Lifestyle  Do not drink alcohol.  Do not use any products that contain nicotine or tobacco, such as cigarettes, e-cigarettes, and chewing tobacco. If you need help quitting, ask your doctor.  Get at least 8 hours of sleep  every night.  Limit and deal with stress. General instructions      Keep a journal to find out what may bring on your migraine headaches. For example, write down: ? What you eat and drink. ? How much sleep you get. ? Any change in what you eat or drink. ? Any change in your medicines.  If you have a  migraine headache: ? Avoid things that make your symptoms worse, such as bright lights. ? It may help to lie down in a dark, quiet room. ? Do not drive or use heavy machinery. ? Ask your doctor what activities are safe for you.  Keep all follow-up visits as told by your doctor. This is important. Contact a doctor if:  You get a migraine headache that is different or worse than others you have had.  You have more than 15 headache days in one month. Get help right away if:  Your migraine headache gets very bad.  Your migraine headache lasts longer than 72 hours.  You have a fever.  You have a stiff neck.  You have trouble seeing.  Your muscles feel weak or like you cannot control them.  You start to lose your balance a lot.  You start to have trouble walking.  You pass out (faint).  You have a seizure. Summary  A migraine headache is a very strong throbbing pain on one side or both sides of your head. These headaches can also cause other symptoms.  This condition may be treated with medicines and changes to your lifestyle.  Keep a journal to find out what may bring on your migraine headaches.  Contact a doctor if you get a migraine headache that is different or worse than others you have had.  Contact your doctor if you have more than 15 headache days in a month. This information is not intended to replace advice given to you by your health care provider. Make sure you discuss any questions you have with your health care provider. Document Revised: 11/20/2018 Document Reviewed: 09/10/2018 Elsevier Patient Education  [place].

## 2019-10-11 NOTE — Progress Notes (Signed)
This visit occurred during the SARS-CoV-2 public health emergency.  Safety protocols were in place, including screening questions prior to the visit, additional usage of staff PPE, and extensive cleaning of exam room while observing appropriate contact time as indicated for disinfecting solutions.    Shannon Quinn , Aug 08, 1971, 49 y.o., female MRN: [id number] Patient Care Team    Relationship Specialty Notifications Start End  Ma Hillock, DO PCP - General Family Medicine  11/03/17   [name], MD PCP - Cardiology Cardiology  09/24/19   [facility]    11/03/17   Everlene Farrier, MD Consulting Physician Obstetrics and Gynecology  11/03/17   Jerrell Belfast, MD Consulting Physician Otolaryngology  11/03/17   Alda Berthold, DO Consulting Physician Neurology  11/03/17   Netta Cedars, MD Consulting Physician Orthopedic Surgery  06/01/19   Fanny Skates, MD Consulting Physician General Surgery  06/01/19     Chief Complaint  Patient presents with  . Migraine    Pt has had Migraine since Friday afternoon. Ringing in head, sensitive to light, Vomitting. Taken RX for nausea and Excedrin Migraine, No relief.      Subjective: Pt presents for an OV with complaints of migraine for 3 days.  Patient reports she is having another migraine.  Last migraine December 03/2019.  She reports this migraine is similar with hearing a ringing in her head, light sensitivity, nausea present.  She did take some leftover Zofran on Saturday which was very helpful.  However on Sunday the nausea remained despite Zofran use.  She did not take the Vistaril.  Patient has not had a history of more chronic migraines until her December migraine.  She reports having watery eyes prior to onset of this migraine.  She is receiving steroid injections for her chronic musculoskeletal/joint pain.  She does endorse complete resolution of all symptoms between her migraines from December until now.  She  does report decreasing her red meat production over the last few months.  She reports her mother has a history of vertigo.  Her sister had vertigo with a "ear tumor.  "  Depression screen [facility] 2/9 06/01/2019  Decreased Interest 0  Down, Depressed, Hopeless 0  PHQ - 2 Score 0    Allergies  Allergen Reactions  . Chlorhexidine Other (See Comments)    Red and purple burns and peeled skin. Cannot use surgical glue.  Pt states Betadine is safe and without reaction.  Marland Kitchen Neomycin     UNSPECIFIED REACTION   . Other Other (See Comments)    Body glue, turns skin blue  . Sulfasalazine Other (See Comments)  . Sulfonamide Derivatives     UNSPECIFIED REACTION   . Augmentin [Amoxicillin-Pot Clavulanate] Nausea And Vomiting    amox ok.   . Hydrocodone Nausea And Vomiting    Severe vomiting  . Oxycodone Nausea And Vomiting    Severe vomiting  . Prednisone Other (See Comments)    insomnia   Social History   Social History Narrative   Regular exercise: walking 1 mile per day   Caffeine use: soda drinker   Lives with husband and 2 children in a one story home.     Works for [employer].  She works in Science writer   Education: associates degree.     Past Medical History:  Diagnosis Date  . Abnormality of right breast on screening mammogram 10/09/2018  . Allergy   . Colon polyps 2011  3 polpys removed  . Complication of anesthesia    "severe Reaction" "Physically ill"   . Deviated septum   . Family history of adverse reaction to anesthesia    violent when waking up from anesthesia - very sick  . Hypertension   . Hypoglycemic syndrome 1996   during pregnancy  . Hypothyroidism   . Lymphedema of right arm   . Melanoma ([place])    03/10/2018  . PONV (postoperative nausea and vomiting)   . Shoulder injury    right - side happened from last surgery  . Thyroid disease   . Varicose vein of leg    Past Surgical History:  Procedure Laterality Date  .  ABDOMINAL HYSTERECTOMY    . BREAST LUMPECTOMY WITH RADIOACTIVE SEED LOCALIZATION Right 10/09/2018   Procedure: RIGHT BREAST LUMPECTOMY WITH RADIOACTIVE SEED LOCALIZATION ERAS PATHWAY;  Surgeon: Fanny Skates, MD;  Location: [place];  Service: General;  Laterality: Right;  . COLONOSCOPY     polyps  . COLONOSCOPY    . LAPAROSCOPIC TOTAL HYSTERECTOMY  09/04/2017   Dr. Gaetano Net for endometriosis  . MELANOMA EXCISION WITH SENTINEL LYMPH NODE BIOPSY Left 04/02/2018   Procedure: INJECT BLUE DYE MELANOMA LEFT BACK WITH  WIDE LOCAL EXCISION MELANOMA LEFT BACK BILATERAL AXILLARY DEEP SENTINEL LYMPH NODE BIOPSY;  Surgeon: Fanny Skates, MD;  Location: [place];  Service: General;  Laterality: Left;  . TUBAL LIGATION    . WISDOM TOOTH EXTRACTION     Family History  Problem Relation Age of Onset  . Arthritis Mother   . Alcohol abuse Father   . Arthritis Father   . Hyperlipidemia Father   . Stroke Father        2012  . Depression Father   . Diabetes Father   . Heart disease Father 87  . Heart failure Father   . Heart attack Father   . Diabetes Sister   . Dementia Maternal Grandmother   . Heart disease Maternal Grandfather 42  . CAD Maternal Grandfather   . Diabetes Paternal Grandmother   . COPD Paternal Grandfather        emphysema  . Cancer Paternal Uncle        breast cancer  . Colon cancer Paternal Uncle   . CAD Maternal Uncle   . Colon cancer Maternal Uncle   . CAD Paternal Aunt   . Cancer Maternal Uncle   . Esophageal cancer Neg Hx   . Stomach cancer Neg Hx   . Rectal cancer Neg Hx    Allergies as of 10/11/2019      Reactions   Chlorhexidine Other (See Comments)   Red and purple burns and peeled skin. Cannot use surgical glue.  Pt states Betadine is safe and without reaction.   Neomycin    UNSPECIFIED REACTION    Other Other (See Comments)   Body glue, turns skin blue   Sulfasalazine Other (See Comments)   Sulfonamide Derivatives     UNSPECIFIED REACTION    Augmentin [amoxicillin-pot Clavulanate] Nausea And Vomiting   amox ok.    Hydrocodone Nausea And Vomiting   Severe vomiting   Oxycodone Nausea And Vomiting   Severe vomiting   Prednisone Other (See Comments)   insomnia      Medication List       Accurate as of October 11, 2019  4:53 PM. If you have any questions, ask your nurse or doctor.        STOP taking these medications  ondansetron 4 MG tablet Commonly known as: ZOFRAN Stopped by: Howard Pouch, DO   potassium chloride SA 20 MEQ tablet Commonly known as: KLOR-CON Stopped by: Howard Pouch, DO   promethazine 12.5 MG tablet Commonly known as: PHENERGAN Stopped by: Howard Pouch, DO     TAKE these medications   aspirin EC 81 MG tablet Take 1 tablet (81 mg total) by mouth daily.   fluticasone 50 MCG/ACT nasal spray Commonly known as: FLONASE Place 2 sprays into both nostrils daily.   hydrochlorothiazide 25 MG tablet Commonly known as: HYDRODIURIL Take 1 tablet (25 mg total) by mouth daily.   hydrOXYzine 25 MG capsule Commonly known as: Vistaril Take 1 capsule (25 mg total) by mouth 3 (three) times daily as needed. What changed: when to take this Changed by: Howard Pouch, DO   ibuprofen 200 MG tablet Commonly known as: ADVIL Take 200 mg by mouth every 6 (six) hours as needed.   metoprolol tartrate 50 MG tablet Commonly known as: Lopressor TAKE 1 TABLET 2 HOURS PRIOR TO YOUR CT   Omega-3 1000 MG Caps Take 2,000 mg by mouth.   omeprazole 40 MG capsule Commonly known as: PRILOSEC Take 1 capsule (40 mg total) by mouth daily.   OVER THE COUNTER MEDICATION   rizatriptan 5 MG tablet Commonly known as: Maxalt Take 1 tablet (5 mg total) by mouth as needed for migraine. May repeat in 2 hours if needed Started by: Howard Pouch, DO   venlafaxine XR 75 MG 24 hr capsule Commonly known as: EFFEXOR-XR Take 1 capsule (75 mg total) by mouth daily.       All past medical history,  surgical history, allergies, family history, immunizations andmedications were updated in the EMR today and reviewed under the history and medication portions of their EMR.     ROS: Negative, with the exception of above mentioned in HPI   Objective:  BP 134/79 (BP Location: Right Arm, Patient Position: Sitting, Cuff Size: Normal)   Pulse 77   Temp (!) 97.5 F (36.4 C) (Temporal)   Resp 16   Ht 5' 4.5" (1.638 m)   Wt 221 lb 6 oz (100.4 kg)   LMP 08/23/2017   SpO2 97%   BMI 37.41 kg/m  Body mass index is 37.41 kg/m. Gen: Afebrile. No acute distress. Nontoxic in appearance, well developed, well nourished.  In exam room with the lights out. HENT: AT. [place]. Eyes:Pupils Equal Round Reactive to light, Extraocular movements intact,  Conjunctiva without redness, discharge or icterus. Neck/lymp/endocrine: Supple, no lymphadenopathy CV: RRR Chest: CTAB, no wheeze or crackles.  Neuro:  Normal gait. PERLA. EOMi. Alert. Oriented x3.  Psych: Normal affect, dress and demeanor. Normal speech. Normal thought content and judgment.  No exam data present No results found. No results found for this or any previous visit (from the past 24 hour(s)).  Assessment/Plan: Shannon Quinn is a 49 y.o. female present for OV for  Other migraine without status migrainosus, not intractable Most likely migraine related considering she has complete resolution after her last migraine in December.  Discussed options for acute and chronic treatment with her today. Patient was provided with Toradol 60/Benadryl 25/Zofran 4 mg IM today. Maxalt and hydroxyzine was prescribed for onset of headache and medication use was explained to her. She was also encouraged to follow-up with her ophthalmologist for a full eye exam. Patient was encouraged to start daily B12 and magnesium supplements. Patient was encouraged to follow-up if symptoms do not resolve within 24 hours.  If any remaining neurological symptoms or ringing in the  ears remains after headache resolves, she is to follow-up at that time as well and we would need to consider initiation of further neurological evaluation.   Reviewed expectations re: course of current medical issues.  Discussed self-management of symptoms.  Outlined signs and symptoms indicating need for more acute intervention.  Patient verbalized understanding and all questions were answered.  Patient received an After-Visit Summary.    No orders of the defined types were placed in this encounter.  Meds ordered this encounter  Medications  . rizatriptan (MAXALT) 5 MG tablet    Sig: Take 1 tablet (5 mg total) by mouth as needed for migraine. May repeat in 2 hours if needed    Dispense:  10 tablet    Refill:  0  . hydrOXYzine (VISTARIL) 25 MG capsule    Sig: Take 1 capsule (25 mg total) by mouth 3 (three) times daily as needed.    Dispense:  90 capsule    Refill:  0  . ketorolac (TORADOL) injection 60 mg  . ondansetron (ZOFRAN) injection 4 mg  . diphenhydrAMINE (BENADRYL) injection 25 mg   Referral Orders  No referral(s) requested today     Note is dictated utilizing voice recognition software. Although note has been proof read prior to signing, occasional typographical errors still can be missed. If any questions arise, please do not hesitate to call for verification.   electronically signed by:  Howard Pouch, DO  [place]

## 2019-10-21 ENCOUNTER — Telehealth: Payer: Self-pay | Admitting: *Deleted

## 2019-10-21 DIAGNOSIS — I10 Essential (primary) hypertension: Secondary | ICD-10-CM

## 2019-10-21 DIAGNOSIS — E7800 Pure hypercholesterolemia, unspecified: Secondary | ICD-10-CM

## 2019-10-21 DIAGNOSIS — Z5181 Encounter for therapeutic drug level monitoring: Secondary | ICD-10-CM

## 2019-10-21 MED ORDER — ROSUVASTATIN CALCIUM 10 MG PO TABS: 10.0000 mg | ORAL_TABLET | Freq: Every day | ORAL | 3 refills | Status: DC

## 2019-10-21 NOTE — Telephone Encounter (Signed)
-----   Message from [name], MD sent at 10/15/2019  1:24 PM EST ----- Cholesterol levels are pretty good.  However given that she has some plaque in her coronaries her LDL should be less than 70.  Recommend starting rosuvastatin 10 mg daily.  This is a low dose.  Repeat lipids and a CMP in 3 months.

## 2019-10-21 NOTE — Telephone Encounter (Signed)
Advised patient of lab results, verbalized understanding.  

## 2019-10-26 ENCOUNTER — Other Ambulatory Visit: Payer: Self-pay

## 2019-10-26 ENCOUNTER — Ambulatory Visit: Payer: BC Managed Care – PPO

## 2019-10-26 ENCOUNTER — Ambulatory Visit
Admission: RE | Admit: 2019-10-26 | Discharge: 2019-10-26 | Disposition: A | Discharge status: Home / Self Care | Payer: BC Managed Care – PPO | Source: Ambulatory Visit | Attending: General Surgery | Admitting: General Surgery

## 2019-10-26 DIAGNOSIS — Z853 Personal history of malignant neoplasm of breast: Secondary | ICD-10-CM | POA: Diagnosis not present

## 2019-10-26 DIAGNOSIS — N6310 Unspecified lump in the right breast, unspecified quadrant: Secondary | ICD-10-CM

## 2019-10-26 DIAGNOSIS — R928 Other abnormal and inconclusive findings on diagnostic imaging of breast: Secondary | ICD-10-CM | POA: Diagnosis not present

## 2019-11-01 ENCOUNTER — Telehealth: Payer: Self-pay

## 2019-11-01 ENCOUNTER — Other Ambulatory Visit: Payer: Self-pay | Admitting: Family Medicine

## 2019-11-01 DIAGNOSIS — G43809 Other migraine, not intractable, without status migrainosus: Secondary | ICD-10-CM

## 2019-11-01 NOTE — Telephone Encounter (Signed)
Patient is requesting a call back from CMA about the frequency of her migraines.

## 2019-11-01 NOTE — Telephone Encounter (Signed)
Contacted patient, she states that her nausea / vomiting associated with her migraines has gotten better but she states she is having increased " ching ching" she states its' like a bike bell that her brain does to indicate a migraine coming on.  I have scheduled her for Friday 3/26 to discuss with Dr Lutricia Feil.   She is requesting refill of maxalt, she states she only has 4 pills remaining.  Please advise if refill is appropriate?

## 2019-11-03 MED ORDER — RIZATRIPTAN BENZOATE 5 MG PO TABS: 5.0000 mg | ORAL_TABLET | ORAL | 0 refills | Status: DC | PRN

## 2019-11-03 NOTE — Telephone Encounter (Signed)
Pt was called and told refill was sent

## 2019-11-03 NOTE — Telephone Encounter (Signed)
Refilled Maxalt

## 2019-11-05 ENCOUNTER — Encounter: Payer: Self-pay | Admitting: Family Medicine

## 2019-11-05 ENCOUNTER — Ambulatory Visit: Payer: BC Managed Care – PPO | Admitting: Family Medicine

## 2019-11-05 ENCOUNTER — Other Ambulatory Visit: Payer: Self-pay

## 2019-11-05 VITALS — BP 113/73 | HR 67 | Temp 98.1°F | Resp 17 | Ht 65.0 in | Wt 229.2 lb

## 2019-11-05 DIAGNOSIS — R519 Headache, unspecified: Secondary | ICD-10-CM | POA: Diagnosis not present

## 2019-11-05 DIAGNOSIS — Z23 Encounter for immunization: Secondary | ICD-10-CM | POA: Diagnosis not present

## 2019-11-05 MED ORDER — TOPIRAMATE 25 MG PO TABS: ORAL_TABLET | ORAL | 2 refills | Status: DC

## 2019-11-05 NOTE — Progress Notes (Signed)
This visit occurred during the SARS-CoV-2 public health emergency.  Safety protocols were in place, including screening questions prior to the visit, additional usage of staff PPE, and extensive cleaning of exam room while observing appropriate contact time as indicated for disinfecting solutions.    Shannon Quinn , [birth year], 49 y.o., female MRN: [id number] Patient Care Team    Relationship Specialty Notifications Start End  Ma Hillock, DO PCP - General Family Medicine  11/03/17   [name], MD PCP - Cardiology Cardiology  09/24/19   [facility]    11/03/17   Everlene Farrier, MD Consulting Physician Obstetrics and Gynecology  11/03/17   Jerrell Belfast, MD Consulting Physician Otolaryngology  11/03/17   Alda Berthold, DO Consulting Physician Neurology  11/03/17   Netta Cedars, MD Consulting Physician Orthopedic Surgery  06/01/19   Fanny Skates, MD Consulting Physician General Surgery  06/01/19     Chief Complaint  Patient presents with  . Migraine    Pt is concerned with taking all the medications she is on and that this is what is causing her migraines.      Subjective: Shannon Quinn is a 49 y.o. female present for migraine follow up.  Patient reports since being seen 10/11/2018 when she has had approximately 7 headache days in total.  She reports after the first headache in which she had a headache cocktail in the office she had no additional nausea and the noise went away for some time however with the start of her most recent symptoms about 1 week ago the "ching ching "noise has returned.  She reports the noise seems to start in the back of her head and work forward.  She has been using the Maxalt and laying down when headaches occur and the Maxalt can be helpful, but has not been taken away the headaches completely.  She feels the noise she hears has increased in volume.  Her mother has vertigo.  Her sister has vertigo and also was diagnosed with  an inner ear tumor.  Patient has had dizziness, hot flashes and nausea with her new worsening headaches. Prior note:  Pt presents for an OV with complaints of migraine for 3 days.  Patient reports she is having another migraine.  Last migraine December 03/2019.  She reports this migraine is similar with hearing a ringing in her head, light sensitivity, nausea present.  She did take some leftover Zofran on Saturday which was very helpful.  However on Sunday the nausea remained despite Zofran use.  She did not take the Vistaril.  Patient has not had a history of more chronic migraines until her December migraine.  She reports having watery eyes prior to onset of this migraine.  She is receiving steroid injections for her chronic musculoskeletal/joint pain.  She does endorse complete resolution of all symptoms between her migraines from December until now.  She does report decreasing her red meat production over the last few months.  She reports her mother has a history of vertigo.  Her sister had vertigo with a "ear tumor.  "  Depression screen [facility] 2/9 06/01/2019  Decreased Interest 0  Down, Depressed, Hopeless 0  PHQ - 2 Score 0    Allergies  Allergen Reactions  . Chlorhexidine Other (See Comments)    Red and purple burns and peeled skin. Cannot use surgical glue.  Pt states Betadine is safe and without reaction.  Marland Kitchen Neomycin     UNSPECIFIED REACTION   .  Other Other (See Comments)    Body glue, turns skin blue  . Sulfasalazine Other (See Comments)  . Sulfonamide Derivatives     UNSPECIFIED REACTION   . Augmentin [Amoxicillin-Pot Clavulanate] Nausea And Vomiting    amox ok.   . Hydrocodone Nausea And Vomiting    Severe vomiting  . Oxycodone Nausea And Vomiting    Severe vomiting  . Prednisone Other (See Comments)    insomnia   Social History   Social History Narrative   Regular exercise: walking 1 mile per day   Caffeine use: soda drinker   Lives with husband and 2 children in a one  story home.     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     Past Medical History:  Diagnosis Date  . Abnormality of right breast on screening mammogram 10/09/2018  . Allergy   . Colon polyps 2011   3 polpys removed  . Complication of anesthesia    "severe Reaction" "Physically ill"   . Deviated septum   . Family history of adverse reaction to anesthesia    violent when waking up from anesthesia - very sick  . Hypertension   . Hypoglycemic syndrome 1996   during pregnancy  . Hypothyroidism   . Lymphedema of right arm   . Melanoma ([place])    03/10/2018  . PONV (postoperative nausea and vomiting)   . Shoulder injury    right - side happened from last surgery  . Thyroid disease   . Varicose vein of leg    Past Surgical History:  Procedure Laterality Date  . ABDOMINAL HYSTERECTOMY    . BREAST LUMPECTOMY WITH RADIOACTIVE SEED LOCALIZATION Right 10/09/2018   Procedure: RIGHT BREAST LUMPECTOMY WITH RADIOACTIVE SEED LOCALIZATION ERAS PATHWAY;  Surgeon: Fanny Skates, MD;  Location: [place];  Service: General;  Laterality: Right;  . COLONOSCOPY     polyps  . COLONOSCOPY    . LAPAROSCOPIC TOTAL HYSTERECTOMY  09/04/2017   Dr. Gaetano Net for endometriosis  . MELANOMA EXCISION WITH SENTINEL LYMPH NODE BIOPSY Left 04/02/2018   Procedure: INJECT BLUE DYE MELANOMA LEFT BACK WITH  WIDE LOCAL EXCISION MELANOMA LEFT BACK BILATERAL AXILLARY DEEP SENTINEL LYMPH NODE BIOPSY;  Surgeon: Fanny Skates, MD;  Location: [facility];  Service: General;  Laterality: Left;  . TUBAL LIGATION    . WISDOM TOOTH EXTRACTION     Family History  Problem Relation Age of Onset  . Arthritis Mother   . Alcohol abuse Father   . Arthritis Father   . Hyperlipidemia Father   . Stroke Father        2012  . Depression Father   . Diabetes Father   . Heart disease Father 21  . Heart failure Father   . Heart  attack Father   . Diabetes Sister   . Dementia Maternal Grandmother   . Heart disease Maternal Grandfather 42  . CAD Maternal Grandfather   . Diabetes Paternal Grandmother   . COPD Paternal Grandfather        emphysema  . Cancer Paternal Uncle        breast cancer  . Colon cancer Paternal Uncle   . CAD Maternal Uncle   . Colon cancer Maternal Uncle   . CAD Paternal Aunt   . Cancer Maternal Uncle   . Esophageal cancer Neg Hx   . Stomach cancer Neg Hx   . Rectal cancer  Neg Hx    Allergies as of 11/05/2019      Reactions   Chlorhexidine Other (See Comments)   Red and purple burns and peeled skin. Cannot use surgical glue.  Pt states Betadine is safe and without reaction.   Neomycin    UNSPECIFIED REACTION    Other Other (See Comments)   Body glue, turns skin blue   Sulfasalazine Other (See Comments)   Sulfonamide Derivatives    UNSPECIFIED REACTION    Augmentin [amoxicillin-pot Clavulanate] Nausea And Vomiting   amox ok.    Hydrocodone Nausea And Vomiting   Severe vomiting   Oxycodone Nausea And Vomiting   Severe vomiting   Prednisone Other (See Comments)   insomnia      Medication List       Accurate as of November 05, 2019 11:59 PM. If you have any questions, ask your nurse or doctor.        STOP taking these medications   diphenhydrAMINE 25 MG tablet Commonly known as: BENADRYL Stopped by: Howard Pouch, DO   ibuprofen 200 MG tablet Commonly known as: ADVIL Stopped by: Howard Pouch, DO   metoprolol tartrate 50 MG tablet Commonly known as: Lopressor Stopped by: Howard Pouch, DO   omeprazole 40 MG capsule Commonly known as: PRILOSEC Stopped by: Howard Pouch, DO     TAKE these medications   aspirin EC 81 MG tablet Take 1 tablet (81 mg total) by mouth daily.   Excedrin Migraine 250-250-65 MG tablet Generic drug: aspirin-acetaminophen-caffeine 2 tablets.   fluticasone 50 MCG/ACT nasal spray Commonly known as: FLONASE Place 2 sprays into both nostrils  daily.   hydrochlorothiazide 25 MG tablet Commonly known as: HYDRODIURIL Take 1 tablet (25 mg total) by mouth daily.   hydrOXYzine 25 MG capsule Commonly known as: Vistaril Take 1 capsule (25 mg total) by mouth 3 (three) times daily as needed.   Omega-3 1000 MG Caps Take 2,000 mg by mouth.   OVER THE COUNTER MEDICATION   rizatriptan 5 MG tablet Commonly known as: Maxalt Take 1 tablet (5 mg total) by mouth as needed for migraine. May repeat in 2 hours if needed   rosuvastatin 10 MG tablet Commonly known as: CRESTOR Take 1 tablet (10 mg total) by mouth daily.   topiramate 25 MG tablet Commonly known as: TOPAMAX 1 tab daily for 7 days, then 1 tabs daily. Started by: Howard Pouch, DO   venlafaxine XR 75 MG 24 hr capsule Commonly known as: EFFEXOR-XR Take 1 capsule (75 mg total) by mouth daily.       All past medical history, surgical history, allergies, family history, immunizations andmedications were updated in the EMR today and reviewed under the history and medication portions of their EMR.     ROS: Negative, with the exception of above mentioned in HPI   Objective:  BP 113/73 (BP Location: Right Arm, Patient Position: Sitting, Cuff Size: Normal)   Pulse 67   Temp 98.1 F (36.7 C) (Temporal)   Resp 17   Ht 5\' 5"  (1.651 m)   Wt 229 lb 4 oz (104 kg)   LMP 08/23/2017   SpO2 98%   BMI 38.15 kg/m  Body mass index is 38.15 kg/m. Gen: Afebrile. No acute distress.  Nontoxic in appearance.  Well-developed, well-nourished, Caucasian female HENT: AT. [place]. Bilateral TM visualized and normal in appearance. MMM. Bilateral nares without erythema, drainage or swelling. Throat without erythema or exudates.  No cough or hoarseness Eyes:Pupils Equal Round Reactive to light, Extraocular  movements intact,  Conjunctiva without redness, discharge or icterus. Neck/lymp/endocrine: Supple, no lymphadenopathy CV: RRR no murmurs, no edema, +2/4 P posterior tibialis pulses.  No carotid  bruit Chest: CTAB, no wheeze or crackles Neuro:  Normal gait. PERLA. EOMi. Alert. Oriented.  Psych: Normal affect, dress and demeanor. Normal speech. Normal thought content and judgment.   No exam data present No results found. No results found for this or any previous visit (from the past 24 hour(s)).  Assessment/Plan: Shannon Quinn is a 49 y.o. female present for OV for  Need for shingles vaccine - Varicella-zoster vaccine IM  Worsening headaches in increased frequency Patient with increased frequency of headaches and worsening headaches along with a sound which can technically be characterized has tinnitus per her description.  She has a mother has vertigo, sister with vertigo and a ear tumor and her worsening headaches have been persistent since December warranting further evaluation with an MRI at this point. She was also encouraged to follow-up with her ophthalmologist for a full eye exam. Continue B12 and magnesium supplements. Continue Maxalt as needed Start Topamax taper to 50 mg daily for migraine prophylaxis given increased frequency of migraines Continue migraine diary - MR Brain W Wo Contrast; Future Patient will be called with MRI results and if warranted a specialty referral will be placed at that time depending upon those results. Follow-up 4 weeks with this provider   Reviewed expectations re: course of current medical issues.  Discussed self-management of symptoms.  Outlined signs and symptoms indicating need for more acute intervention.  Patient verbalized understanding and all questions were answered.  Patient received an After-Visit Summary.    Orders Placed This Encounter  Procedures  . MR Brain W Wo Contrast  . Varicella-zoster vaccine IM   Referral Orders  No referral(s) requested today   Meds started: Topamax  Note is dictated utilizing voice recognition software. Although note has been proof read prior to signing, occasional typographical  errors still can be missed. If any questions arise, please do not hesitate to call for verification.   electronically signed by:  Howard Pouch, DO  [place]

## 2019-11-05 NOTE — Patient Instructions (Addendum)
COVID-19 Vaccine Information can be found at: [email or web address] For questions related to vaccine distribution or appointments, please email [email or web address] or call [phone].  Covid Vaccine appointment go to [email or web address].   Wait 4 weeks until you have your COVID shot from your shingrix shot. CDC recommends 2 weeks between any vaccines and covid- but I tell people 4 weeks between covid and shingrix.   Meds the same with added topamax> 1 tab for 7 days then 2 tabs daily (sametime).   They will call you to schedule your MRI and then we will decide on which direction to go for speciality referral.   Follow 4 weeks with me for med changes.    Analgesic Rebound Headache An analgesic rebound headache, sometimes called a medication overuse headache, is a headache that comes after pain medicine (analgesic) taken to treat the original (primary) headache has worn off. Any type of primary headache can return as a rebound headache if a person regularly takes analgesics more than three times a week to treat it. The types of primary headaches that are commonly associated with rebound headaches include:  Migraines.  Headaches that arise from tense muscles in the head and neck area (tension headaches).  Headaches that develop and happen again (recur) on one side of the head and around the eye (cluster headaches). If rebound headaches continue, they become chronic daily headaches. What are the causes? This condition may be caused by frequent use of:  Over-the-counter medicines such as aspirin, ibuprofen, and acetaminophen.  Sinus relief medicines and other medicines that contain caffeine.  Narcotic pain medicines such as codeine and oxycodone. What are the signs or symptoms? The symptoms of a rebound headache are the same as the symptoms of the original headache. Some of the symptoms of specific types of headaches  include: Migraine headache  Pulsing or throbbing pain on one or both sides of the head.  Severe pain that interferes with daily activities.  Pain that is worsened by physical activity.  Nausea, vomiting, or both.  Pain with exposure to bright light, loud noises, or strong smells.  General sensitivity to bright light, loud noises, or strong smells.  Visual changes.  Numbness of one or both arms. Tension headache  Pressure around the head.  Dull, aching head pain.  Pain felt over the front and sides of the head.  Tenderness in the muscles of the head, neck, and shoulders. Cluster headache  Severe pain that begins in or around one eye or temple.  Redness and tearing in the eye on the same side as the pain.  Droopy or swollen eyelid.  One-sided head pain.  Nausea.  Runny nose.  Sweaty, pale facial skin.  Restlessness. How is this diagnosed? This condition is diagnosed by:  Reviewing your medical history. This includes the nature of your primary headaches.  Reviewing the types of pain medicines that you have been using to treat your headaches and how often you take them. How is this treated? This condition may be treated or managed by:  Discontinuing frequent use of the analgesic medicine. Doing this may worsen your headaches at first, but the pain should eventually become more manageable, less frequent, and less severe.  Seeing a headache specialist. He or she may be able to help you manage your headaches and help make sure there is not another cause of the headaches.  Using methods of stress relief, such as acupuncture, counseling, biofeedback, and massage. Talk with your health care provider about which methods might be good  for you. Follow these instructions at home:  Take over-the-counter and prescription medicines only as told by your health care provider.  Stop the repeated use of pain medicine as told by your health care provider. Stopping can be  difficult. Carefully follow instructions from your health care provider.  Avoid triggers that are known to cause your primary headaches.  Keep all follow-up visits as told by your health care provider. This is important. Contact a health care provider if:  You continue to experience headaches after following treatments that your health care provider recommended. Get help right away if:  You develop new headache pain.  You develop headache pain that is different than what you have experienced in the past.  You develop numbness or tingling in your arms or legs.  You develop changes in your speech or vision. This information is not intended to replace advice given to you by your health care provider. Make sure you discuss any questions you have with your health care provider. Document Revised: 07/11/2017 Document Reviewed: 01/01/2016 Elsevier Patient Education  [place].  Migraine Headache A migraine headache is a very strong throbbing pain on one side or both sides of your head. This type of headache can also cause other symptoms. It can last from 4 hours to 3 days. Talk with your doctor about what things may bring on (trigger) this condition. What are the causes? The exact cause of this condition is not known. This condition may be triggered or caused by:  Drinking alcohol.  Smoking.  Taking medicines, such as: ? Medicine used to treat chest pain (nitroglycerin). ? Birth control pills. ? Estrogen. ? Some blood pressure medicines.  Eating or drinking certain products.  Doing physical activity. Other things that may trigger a migraine headache include:  Having a menstrual period.  Pregnancy.  Hunger.  Stress.  Not getting enough sleep or getting too much sleep.  Weather changes.  Tiredness (fatigue). What increases the risk?  Being 43-24 years old.  Being female.  Having a family history of migraine headaches.  Being Caucasian.  Having depression  or anxiety.  Being very overweight. What are the signs or symptoms?  A throbbing pain. This pain may: ? Happen in any area of the head, such as on one side or both sides. ? Make it hard to do daily activities. ? Get worse with physical activity. ? Get worse around bright lights or loud noises.  Other symptoms may include: ? Feeling sick to your stomach (nauseous). ? Vomiting. ? Dizziness. ? Being sensitive to bright lights, loud noises, or smells.  Before you get a migraine headache, you may get warning signs (an aura). An aura may include: ? Seeing flashing lights or having blind spots. ? Seeing bright spots, halos, or zigzag lines. ? Having tunnel vision or blurred vision. ? Having numbness or a tingling feeling. ? Having trouble talking. ? Having weak muscles.  Some people have symptoms after a migraine headache (postdromal phase), such as: ? Tiredness. ? Trouble thinking (concentrating). How is this treated?  Taking medicines that: ? Relieve pain. ? Relieve the feeling of being sick to your stomach. ? Prevent migraine headaches.  Treatment may also include: ? Having acupuncture. ? Avoiding foods that bring on migraine headaches. ? Learning ways to control your body functions (biofeedback). ? Therapy to help you know and deal with negative thoughts (cognitive behavioral therapy). Follow these instructions at home: Medicines  Take over-the-counter and prescription medicines only as told by your  doctor.  Ask your doctor if the medicine prescribed to you: ? Requires you to avoid driving or using heavy machinery. ? Can cause trouble pooping (constipation). You may need to take these steps to prevent or treat trouble pooping:  Drink enough fluid to keep your pee (urine) pale yellow.  Take over-the-counter or prescription medicines.  Eat foods that are high in fiber. These include beans, whole grains, and fresh fruits and vegetables.  Limit foods that are high in  fat and sugar. These include fried or sweet foods. Lifestyle  Do not drink alcohol.  Do not use any products that contain nicotine or tobacco, such as cigarettes, e-cigarettes, and chewing tobacco. If you need help quitting, ask your doctor.  Get at least 8 hours of sleep every night.  Limit and deal with stress. General instructions      Keep a journal to find out what may bring on your migraine headaches. For example, write down: ? What you eat and drink. ? How much sleep you get. ? Any change in what you eat or drink. ? Any change in your medicines.  If you have a migraine headache: ? Avoid things that make your symptoms worse, such as bright lights. ? It may help to lie down in a dark, quiet room. ? Do not drive or use heavy machinery. ? Ask your doctor what activities are safe for you.  Keep all follow-up visits as told by your doctor. This is important. Contact a doctor if:  You get a migraine headache that is different or worse than others you have had.  You have more than 15 headache days in one month. Get help right away if:  Your migraine headache gets very bad.  Your migraine headache lasts longer than 72 hours.  You have a fever.  You have a stiff neck.  You have trouble seeing.  Your muscles feel weak or like you cannot control them.  You start to lose your balance a lot.  You start to have trouble walking.  You pass out (faint).  You have a seizure. Summary  A migraine headache is a very strong throbbing pain on one side or both sides of your head. These headaches can also cause other symptoms.  This condition may be treated with medicines and changes to your lifestyle.  Keep a journal to find out what may bring on your migraine headaches.  Contact a doctor if you get a migraine headache that is different or worse than others you have had.  Contact your doctor if you have more than 15 headache days in a month. This information is not  intended to replace advice given to you by your health care provider. Make sure you discuss any questions you have with your health care provider. Document Revised: 11/20/2018 Document Reviewed: 09/10/2018 Elsevier Patient Education  [place].

## 2019-11-08 ENCOUNTER — Telehealth: Payer: Self-pay

## 2019-11-08 MED ORDER — TOPIRAMATE 25 MG PO TABS: ORAL_TABLET | ORAL | 2 refills | Status: DC

## 2019-11-08 NOTE — Addendum Note (Signed)
Addended by: Howard Pouch A on: 11/08/2019 08:57 AM   Modules accepted: Orders

## 2019-11-08 NOTE — Telephone Encounter (Signed)
Correction was made to prescription and resent, please call pharmacy to verify they have received.  Tapering instructions of Topamax 25 mg (1 tab)daily for 7 days and increase to 50 mg (2 tab) daily

## 2019-11-08 NOTE — Telephone Encounter (Signed)
[facility] sent fax needing clarification on the instructions for her, Topiramate 25mg . Please advise.

## 2019-11-09 NOTE — Telephone Encounter (Signed)
Called pharmacy and clarified medication instructions.

## 2019-11-27 ENCOUNTER — Ambulatory Visit (HOSPITAL_BASED_OUTPATIENT_CLINIC_OR_DEPARTMENT_OTHER)
Admission: RE | Admit: 2019-11-27 | Discharge: 2019-11-27 | Disposition: A | Discharge status: Home / Self Care | Payer: BC Managed Care – PPO | Source: Ambulatory Visit | Attending: Family Medicine | Admitting: Family Medicine

## 2019-11-27 ENCOUNTER — Other Ambulatory Visit: Payer: Self-pay

## 2019-11-27 DIAGNOSIS — R519 Headache, unspecified: Secondary | ICD-10-CM

## 2019-11-27 DIAGNOSIS — R42 Dizziness and giddiness: Secondary | ICD-10-CM | POA: Diagnosis not present

## 2019-11-27 MED ORDER — GADOBUTROL 1 MMOL/ML IV SOLN
10.0000 mL | Freq: Once | INTRAVENOUS | Status: AC | PRN
  Administered 2019-11-27: 17:00:00 10 mL via INTRAVENOUS

## 2019-11-29 ENCOUNTER — Telehealth: Payer: Self-pay | Admitting: Family Medicine

## 2019-11-29 NOTE — Telephone Encounter (Signed)
Pt was called and given information/results. She made F/U appt.

## 2019-11-29 NOTE — Telephone Encounter (Signed)
Please inform patient her MRI is normal. Have placed a referral to neurology to further evaluate her increased headaches and associated symptoms.  I do not see she has an appointment here for follow-up after starting the Topamax.  Please make sure she is scheduled for an appointment with this provider ~4/26

## 2019-12-09 ENCOUNTER — Telehealth: Payer: Self-pay

## 2019-12-09 MED ORDER — VENLAFAXINE HCL ER 75 MG PO CP24: 75.0000 mg | ORAL_CAPSULE | Freq: Every day | ORAL | 0 refills | Status: DC

## 2019-12-09 NOTE — Telephone Encounter (Signed)
Pt needs refill on Effexor. She was last seen 05/31/2020 for depression/anxiety. Pt has appt scheduled 12/17/2019. Will send enough meds to last until patients upcoming appt.

## 2019-12-17 ENCOUNTER — Ambulatory Visit: Payer: BC Managed Care – PPO | Admitting: Family Medicine

## 2019-12-17 ENCOUNTER — Other Ambulatory Visit: Payer: Self-pay

## 2019-12-17 ENCOUNTER — Encounter: Payer: Self-pay | Admitting: Family Medicine

## 2019-12-17 VITALS — BP 120/84 | HR 65 | Temp 97.7°F | Resp 17 | Ht 65.0 in | Wt 228.5 lb

## 2019-12-17 DIAGNOSIS — F32A Depression, unspecified: Secondary | ICD-10-CM

## 2019-12-17 DIAGNOSIS — H6983 Other specified disorders of Eustachian tube, bilateral: Secondary | ICD-10-CM | POA: Diagnosis not present

## 2019-12-17 DIAGNOSIS — I10 Essential (primary) hypertension: Secondary | ICD-10-CM

## 2019-12-17 DIAGNOSIS — G43809 Other migraine, not intractable, without status migrainosus: Secondary | ICD-10-CM | POA: Diagnosis not present

## 2019-12-17 DIAGNOSIS — F419 Anxiety disorder, unspecified: Secondary | ICD-10-CM

## 2019-12-17 DIAGNOSIS — F329 Major depressive disorder, single episode, unspecified: Secondary | ICD-10-CM

## 2019-12-17 MED ORDER — TOPIRAMATE 25 MG PO TABS: 25.0000 mg | ORAL_TABLET | Freq: Every day | ORAL | 1 refills | Status: DC

## 2019-12-17 MED ORDER — RIZATRIPTAN BENZOATE 5 MG PO TABS: 5.0000 mg | ORAL_TABLET | ORAL | 0 refills | Status: DC | PRN

## 2019-12-17 MED ORDER — HYDROCHLOROTHIAZIDE 25 MG PO TABS: 25.0000 mg | ORAL_TABLET | Freq: Every day | ORAL | 1 refills | Status: DC

## 2019-12-17 MED ORDER — VENLAFAXINE HCL ER 75 MG PO CP24: 75.0000 mg | ORAL_CAPSULE | Freq: Every day | ORAL | 1 refills | Status: DC

## 2019-12-17 MED ORDER — HYDROXYZINE PAMOATE 25 MG PO CAPS: 25.0000 mg | ORAL_CAPSULE | Freq: Three times a day (TID) | ORAL | 1 refills | Status: DC | PRN

## 2019-12-17 NOTE — Progress Notes (Signed)
This visit occurred during the SARS-CoV-2 public health emergency.  Safety protocols were in place, including screening questions prior to the visit, additional usage of staff PPE, and extensive cleaning of exam room while observing appropriate contact time as indicated for disinfecting solutions.    Shannon Quinn , 1971/08/06, 49 y.o., female MRN: [id number] Patient Care Team    Relationship Specialty Notifications Start End  Ma Hillock, DO PCP - General Family Medicine  11/03/17   [name], MD PCP - Cardiology Cardiology  09/24/19   [facility]    11/03/17   Everlene Farrier, MD Consulting Physician Obstetrics and Gynecology  11/03/17   Jerrell Belfast, MD Consulting Physician Otolaryngology  11/03/17   Alda Berthold, DO Consulting Physician Neurology  11/03/17   Netta Cedars, MD Consulting Physician Orthopedic Surgery  06/01/19   Fanny Skates, MD Consulting Physician General Surgery  06/01/19     Chief Complaint  Patient presents with  . Migraine    Pt is here for F/U on migraines.      Subjective: Shannon Quinn is a 49 y.o. female present for migraine follow up and [place]. Anxiety and depression/Hot flashes due to menopause Pt reports she is doing well on  effexor 75 mg QD- no complaints.   Hypertension/Fhx heart disease Pt reports complaince with HCTZ. Patient denies chest pain, shortness of breath, dizziness or lower extremity edema.  Pt does not take  daily baby ASA. Pt is not prescribed statin. She and her family have started a mediterranean diet ant home. RF: HTN, obesity, FH heart disease and MI at 13 in father.  Migraine:   She states her headaches have greatly improved. She has not needed to take the maxalt since starting topamax 25 mg QD. The noise in her ear/head has resolved. Prior note: Patient reports since being seen 10/11/2018 when she has had approximately 7 headache days in total.  She reports after the first headache in which  she had a headache cocktail in the office she had no additional nausea and the noise went away for some time however with the start of her most recent symptoms about 1 week ago the "ching ching "noise has returned.  She reports the noise seems to start in the back of her head and work forward.  She has been using the Maxalt and laying down when headaches occur and the Maxalt can be helpful, but has not been taken away the headaches completely.  She feels the noise she hears has increased in volume.  Her mother has vertigo.  Her sister has vertigo and also was diagnosed with an inner ear tumor.  Patient has had dizziness, hot flashes and nausea with her new worsening headaches. Prior note:  Pt presents for an OV with complaints of migraine for 3 days.  Patient reports she is having another migraine.  Last migraine December 03/2019.  She reports this migraine is similar with hearing a ringing in her head, light sensitivity, nausea present.  She did take some leftover Zofran on Saturday which was very helpful.  However on Sunday the nausea remained despite Zofran use.  She did not take the Vistaril.  Patient has not had a history of more chronic migraines until her December migraine.  She reports having watery eyes prior to onset of this migraine.  She is receiving steroid injections for her chronic musculoskeletal/joint pain.  She does endorse complete resolution of all symptoms between her migraines from December until now.  She does report decreasing her red meat production over the last few months.  She reports her mother has a history of vertigo.  Her sister had vertigo with a "ear tumor.  "  Depression screen [facility] 2/9 12/17/2019 06/01/2019  Decreased Interest 0 0  Down, Depressed, Hopeless 1 0  PHQ - 2 Score 1 0  Altered sleeping 1 -  Tired, decreased energy 3 -  Change in appetite 0 -  Feeling bad or failure about yourself  1 -  Trouble concentrating 0 -  Moving slowly or fidgety/restless 0 -  Suicidal  thoughts 0 -  PHQ-9 Score 6 -  Difficult doing work/chores Not difficult at all -    Allergies  Allergen Reactions  . Chlorhexidine Other (See Comments)    Red and purple burns and peeled skin. Cannot use surgical glue.  Pt states Betadine is safe and without reaction.  Marland Kitchen Neomycin     UNSPECIFIED REACTION   . Other Other (See Comments)    Body glue, turns skin blue  . Sulfasalazine Other (See Comments)  . Sulfonamide Derivatives     UNSPECIFIED REACTION   . Augmentin [Amoxicillin-Pot Clavulanate] Nausea And Vomiting    amox ok.   . Hydrocodone Nausea And Vomiting    Severe vomiting  . Oxycodone Nausea And Vomiting    Severe vomiting  . Prednisone Other (See Comments)    insomnia   Social History   Social History Narrative   Regular exercise: walking 1 mile per day   Caffeine use: soda drinker   Lives with husband and 2 children in a one story home.     Works for [employer].  She works in Science writer   Education: associates degree.     Past Medical History:  Diagnosis Date  . Abnormality of right breast on screening mammogram 10/09/2018  . Allergy   . Colon polyps 2011   3 polpys removed  . Complication of anesthesia    "severe Reaction" "Physically ill"   . Deviated septum   . Family history of adverse reaction to anesthesia    violent when waking up from anesthesia - very sick  . Hypertension   . Hypoglycemic syndrome 1996   during pregnancy  . Hypothyroidism   . Lymphedema of right arm   . Melanoma ([place])    03/10/2018  . PONV (postoperative nausea and vomiting)   . Shoulder injury    right - side happened from last surgery  . Thyroid disease   . Varicose vein of leg    Past Surgical History:  Procedure Laterality Date  . ABDOMINAL HYSTERECTOMY    . BREAST LUMPECTOMY WITH RADIOACTIVE SEED LOCALIZATION Right 10/09/2018   Procedure: RIGHT BREAST LUMPECTOMY WITH RADIOACTIVE SEED LOCALIZATION ERAS PATHWAY;  Surgeon: Fanny Skates, MD;  Location: [facility];  Service: General;  Laterality: Right;  . COLONOSCOPY     polyps  . COLONOSCOPY    . LAPAROSCOPIC TOTAL HYSTERECTOMY  09/04/2017   Dr. Gaetano Net for endometriosis  . MELANOMA EXCISION WITH SENTINEL LYMPH NODE BIOPSY Left 04/02/2018   Procedure: INJECT BLUE DYE MELANOMA LEFT BACK WITH  WIDE LOCAL EXCISION MELANOMA LEFT BACK BILATERAL AXILLARY DEEP SENTINEL LYMPH NODE BIOPSY;  Surgeon: Fanny Skates, MD;  Location: [place];  Service: General;  Laterality: Left;  . TUBAL LIGATION    . WISDOM TOOTH EXTRACTION     Family History  Problem Relation Age of Onset  . Arthritis  Mother   . Alcohol abuse Father   . Arthritis Father   . Hyperlipidemia Father   . Stroke Father        2012  . Depression Father   . Diabetes Father   . Heart disease Father 67  . Heart failure Father   . Heart attack Father   . Diabetes Sister   . Dementia Maternal Grandmother   . Heart disease Maternal Grandfather 42  . CAD Maternal Grandfather   . Diabetes Paternal Grandmother   . COPD Paternal Grandfather        emphysema  . Cancer Paternal Uncle        breast cancer  . Colon cancer Paternal Uncle   . CAD Maternal Uncle   . Colon cancer Maternal Uncle   . CAD Paternal Aunt   . Cancer Maternal Uncle   . Esophageal cancer Neg Hx   . Stomach cancer Neg Hx   . Rectal cancer Neg Hx    Allergies as of 12/17/2019      Reactions   Chlorhexidine Other (See Comments)   Red and purple burns and peeled skin. Cannot use surgical glue.  Pt states Betadine is safe and without reaction.   Neomycin    UNSPECIFIED REACTION    Other Other (See Comments)   Body glue, turns skin blue   Sulfasalazine Other (See Comments)   Sulfonamide Derivatives    UNSPECIFIED REACTION    Augmentin [amoxicillin-pot Clavulanate] Nausea And Vomiting   amox ok.    Hydrocodone Nausea And Vomiting   Severe vomiting   Oxycodone Nausea And Vomiting   Severe vomiting    Prednisone Other (See Comments)   insomnia      Medication List       Accurate as of Dec 17, 2019 11:34 AM. If you have any questions, ask your nurse or doctor.        STOP taking these medications   Excedrin Migraine 250-250-65 MG tablet Generic drug: aspirin-acetaminophen-caffeine Stopped by: Howard Pouch, DO     TAKE these medications   aspirin EC 81 MG tablet Take 1 tablet (81 mg total) by mouth daily.   fluticasone 50 MCG/ACT nasal spray Commonly known as: FLONASE Place 2 sprays into both nostrils daily.   hydrochlorothiazide 25 MG tablet Commonly known as: HYDRODIURIL Take 1 tablet (25 mg total) by mouth daily.   hydrOXYzine 25 MG capsule Commonly known as: Vistaril Take 1 capsule (25 mg total) by mouth 3 (three) times daily as needed.   Omega-3 1000 MG Caps Take 2,000 mg by mouth.   OVER THE COUNTER MEDICATION   rizatriptan 5 MG tablet Commonly known as: Maxalt Take 1 tablet (5 mg total) by mouth as needed for migraine. May repeat in 2 hours if needed   rosuvastatin 10 MG tablet Commonly known as: CRESTOR Take 1 tablet (10 mg total) by mouth daily.   topiramate 25 MG tablet Commonly known as: TOPAMAX Take 1 tablet (25 mg total) by mouth daily. What changed:   how much to take  how to take this  when to take this  additional instructions Changed by: Howard Pouch, DO   venlafaxine XR 75 MG 24 hr capsule Commonly known as: EFFEXOR-XR Take 1 capsule (75 mg total) by mouth daily.       All past medical history, surgical history, allergies, family history, immunizations andmedications were updated in the EMR today and reviewed under the history and medication portions of their EMR.  ROS: Negative, with the exception of above mentioned in HPI   Objective:  BP 120/84 (BP Location: Left Arm, Patient Position: Sitting, Cuff Size: Normal)   Pulse 65   Temp 97.7 F (36.5 C) (Temporal)   Resp 17   Ht 5\' 5"  (1.651 m)   Wt 228 lb 8 oz (103.6  kg)   LMP 08/23/2017   SpO2 100%   BMI 38.02 kg/m  Body mass index is 38.02 kg/m. Gen: Afebrile. No acute distress.  HENT: AT. [place].MMM.  Eyes:Pupils Equal Round Reactive to light, Extraocular movements intact,  Conjunctiva without redness, discharge or icterus. CV: RRR no murmur, no edema Chest: CTAB, no wheeze or crackles Skin: no rashes, purpura or petechiae.  Neuro:  Normal gait. PERLA. EOMi. Alert. Oriented x3  Psych: Normal affect, dress and demeanor. Normal speech. Normal thought content and judgment.  No exam data present No results found. No results found for this or any previous visit (from the past 24 hour(s)).  Assessment/Plan: Jewelya Dirusso is a 49 y.o. female present for OV for  Worsening headaches in increased frequency Much improved. Continue topamax 25 mg QD Continue B12 and magnesium supplements. Continue Maxalt as needed - MR Brain W Wo Contrast> WNL Follow-up 5.5 months  Anxiety and depression/Hot flashes due to menopause Stable.  Continue effexor 75  Hypertension/Fhx heart disease:  Stable.  Continue hctz 25 Low-sodium diet, routine exercise.    Reviewed expectations re: course of current medical issues.  Discussed self-management of symptoms.  Outlined signs and symptoms indicating need for more acute intervention.  Patient verbalized understanding and all questions were answered.  Patient received an After-Visit Summary.  f/u 5.5 mos.   No orders of the defined types were placed in this encounter.  Referral Orders  No referral(s) requested today   Meds ordered this encounter  Medications  . venlafaxine XR (EFFEXOR-XR) 75 MG 24 hr capsule    Sig: Take 1 capsule (75 mg total) by mouth daily.    Dispense:  90 capsule    Refill:  1  . topiramate (TOPAMAX) 25 MG tablet    Sig: Take 1 tablet (25 mg total) by mouth daily.    Dispense:  90 tablet    Refill:  1  . rizatriptan (MAXALT) 5 MG tablet    Sig: Take 1 tablet (5 mg total) by  mouth as needed for migraine. May repeat in 2 hours if needed    Dispense:  10 tablet    Refill:  0  . hydrOXYzine (VISTARIL) 25 MG capsule    Sig: Take 1 capsule (25 mg total) by mouth 3 (three) times daily as needed.    Dispense:  90 capsule    Refill:  1  . hydrochlorothiazide (HYDRODIURIL) 25 MG tablet    Sig: Take 1 tablet (25 mg total) by mouth daily.    Dispense:  90 tablet    Refill:  1     Note is dictated utilizing voice recognition software. Although note has been proof read prior to signing, occasional typographical errors still can be missed. If any questions arise, please do not hesitate to call for verification.   electronically signed by:  Howard Pouch, DO  [place]

## 2019-12-17 NOTE — Patient Instructions (Signed)
I am glad you are feeling better!  I have refilled all your meds.  Follow up in 5.5 months- prior to needing med refills.

## 2019-12-22 ENCOUNTER — Other Ambulatory Visit: Payer: Self-pay | Admitting: Oncology

## 2019-12-22 ENCOUNTER — Ambulatory Visit (HOSPITAL_COMMUNITY)
Admission: RE | Admit: 2019-12-22 | Discharge: 2019-12-22 | Disposition: A | Discharge status: Home / Self Care | Payer: BC Managed Care – PPO | Source: Ambulatory Visit | Attending: Oncology | Admitting: Oncology

## 2019-12-22 ENCOUNTER — Encounter (HOSPITAL_COMMUNITY): Payer: Self-pay

## 2019-12-22 ENCOUNTER — Other Ambulatory Visit: Payer: Self-pay

## 2019-12-22 DIAGNOSIS — C4490 Unspecified malignant neoplasm of skin, unspecified: Secondary | ICD-10-CM | POA: Diagnosis not present

## 2019-12-22 DIAGNOSIS — C439 Malignant melanoma of skin, unspecified: Secondary | ICD-10-CM

## 2019-12-22 MED ORDER — IOHEXOL 300 MG/ML  SOLN: 75.0000 mL | Freq: Once | INTRAMUSCULAR | Status: DC | PRN

## 2019-12-22 MED ORDER — IOHEXOL 300 MG/ML  SOLN
75.0000 mL | Freq: Once | INTRAMUSCULAR | Status: AC | PRN
  Administered 2019-12-22: 75 mL via INTRAVENOUS

## 2019-12-28 ENCOUNTER — Inpatient Hospital Stay: Payer: BC Managed Care – PPO | Attending: Oncology

## 2019-12-28 ENCOUNTER — Other Ambulatory Visit: Payer: Self-pay

## 2019-12-28 ENCOUNTER — Inpatient Hospital Stay: Payer: BC Managed Care – PPO | Admitting: Oncology

## 2019-12-28 VITALS — BP 124/91 | HR 62 | Temp 98.2°F | Resp 17 | Ht 65.0 in | Wt 226.7 lb

## 2019-12-28 DIAGNOSIS — C439 Malignant melanoma of skin, unspecified: Secondary | ICD-10-CM

## 2019-12-28 DIAGNOSIS — Z85820 Personal history of malignant melanoma of skin: Secondary | ICD-10-CM | POA: Diagnosis not present

## 2019-12-28 LAB — CBC WITH DIFFERENTIAL (CANCER CENTER ONLY)
Abs Immature Granulocytes: 0.01 10*3/uL (ref 0.00–0.07)
Basophils Absolute: 0.1 10*3/uL (ref 0.0–0.1)
Basophils Relative: 1 %
Eosinophils Absolute: 0.1 10*3/uL (ref 0.0–0.5)
Eosinophils Relative: 2 %
HCT: 43.4 % (ref 36.0–46.0)
Hemoglobin: 14 g/dL (ref 12.0–15.0)
Immature Granulocytes: 0 %
Lymphocytes Relative: 33 %
Lymphs Abs: 2.4 10*3/uL (ref 0.7–4.0)
MCH: 29.5 pg (ref 26.0–34.0)
MCHC: 32.3 g/dL (ref 30.0–36.0)
MCV: 91.4 fL (ref 80.0–100.0)
Monocytes Absolute: 0.5 10*3/uL (ref 0.1–1.0)
Monocytes Relative: 7 %
Neutro Abs: 4.1 10*3/uL (ref 1.7–7.7)
Neutrophils Relative %: 57 %
Platelet Count: 287 10*3/uL (ref 150–400)
RBC: 4.75 MIL/uL (ref 3.87–5.11)
RDW: 13.2 % (ref 11.5–15.5)
WBC Count: 7.1 10*3/uL (ref 4.0–10.5)
nRBC: 0 % (ref 0.0–0.2)

## 2019-12-28 LAB — CMP (CANCER CENTER ONLY)
ALT: 24 U/L (ref 0–44)
AST: 19 U/L (ref 15–41)
Albumin: 3.8 g/dL (ref 3.5–5.0)
Alkaline Phosphatase: 81 U/L (ref 38–126)
Anion gap: 10 (ref 5–15)
BUN: 15 mg/dL (ref 6–20)
CO2: 24 mmol/L (ref 22–32)
Calcium: 9.4 mg/dL (ref 8.9–10.3)
Chloride: 109 mmol/L (ref 98–111)
Creatinine: 0.97 mg/dL (ref 0.44–1.00)
GFR, Est AFR Am: >60 mL/min (ref >60)
GFR, Estimated: >60 mL/min (ref >60)
Glucose, Bld: 76 mg/dL (ref 70–99)
Potassium: 3.8 mmol/L (ref 3.5–5.1)
Sodium: 143 mmol/L (ref 135–145)
Total Bilirubin: 0.4 mg/dL (ref 0.3–1.2)
Total Protein: 7.2 g/dL (ref 6.5–8.1)

## 2019-12-28 NOTE — Progress Notes (Signed)
Hematology and Oncology Follow Up Visit  Shannon Quinn [id number] 1971-05-28 49 y.o. 12/28/2019 9:32 AM Raoul Pitch, [name], DOKuneff, [name], DO   Principle Diagnosis: 49 year old woman with melanoma of the upper back in 2019.  She was found to have T2aN1, 1.1 mm depth with 1 out of 10 lymph node involved with 0.1 mm involvement in the lymph node.   Prior Therapy: She underwent wide excision with bilateral sentinel lymph node biopsy of the axilla.  The final pathology obtained on April 02, 2018 showed a residual melanoma of 0.8 mm T staging was T2a.  She had bilateral lymph node sentinel lymph node biopsy with 1 out of 10 lymph nodes were positive for melanoma.  Less than 0.1 mm tiny focus of melanoma seen on 1 of those 10 lymph nodes.   Current therapy: Active surveillance.  Interim History: [name] is here for a follow-up evaluation.  Since her last visit, he reports no major changes in her health.  She was diagnosed with migraine headaches and had an MRI on November 27, 2019.  She also follows with cardiology regarding hypertension and coronary disease.  Overall she has not reported any new complaints.  She has not reported any recent hospitalizations or illnesses.  She denies any recent skin rash or lesions and continues to have dermatology surveillance.             Medications: Reviewed without changes. Current Outpatient Medications  Medication Sig Dispense Refill  . aspirin EC 81 MG tablet Take 1 tablet (81 mg total) by mouth daily. 90 tablet 3  . fluticasone (FLONASE) 50 MCG/ACT nasal spray Place 2 sprays into both nostrils daily. 16 g 11  . hydrochlorothiazide (HYDRODIURIL) 25 MG tablet Take 1 tablet (25 mg total) by mouth daily. 90 tablet 1  . hydrOXYzine (VISTARIL) 25 MG capsule Take 1 capsule (25 mg total) by mouth 3 (three) times daily as needed. 90 capsule 1  . Omega-3 1000 MG CAPS Take 2,000 mg by mouth.    Marland Kitchen OVER THE COUNTER MEDICATION     . rizatriptan (MAXALT) 5  MG tablet Take 1 tablet (5 mg total) by mouth as needed for migraine. May repeat in 2 hours if needed 10 tablet 0  . rosuvastatin (CRESTOR) 10 MG tablet Take 1 tablet (10 mg total) by mouth daily. 90 tablet 3  . topiramate (TOPAMAX) 25 MG tablet Take 1 tablet (25 mg total) by mouth daily. 90 tablet 1  . venlafaxine XR (EFFEXOR-XR) 75 MG 24 hr capsule Take 1 capsule (75 mg total) by mouth daily. 90 capsule 1   No current facility-administered medications for this visit.     Allergies:  Allergies  Allergen Reactions  . Chlorhexidine Other (See Comments)    Red and purple burns and peeled skin. Cannot use surgical glue.  Pt states Betadine is safe and without reaction.  Marland Kitchen Neomycin     UNSPECIFIED REACTION   . Other Other (See Comments)    Body glue, turns skin blue  . Sulfasalazine Other (See Comments)  . Sulfonamide Derivatives     UNSPECIFIED REACTION   . Augmentin [Amoxicillin-Pot Clavulanate] Nausea And Vomiting    amox ok.   . Hydrocodone Nausea And Vomiting    Severe vomiting  . Oxycodone Nausea And Vomiting    Severe vomiting  . Prednisone Other (See Comments)    insomnia        Physical Exam:  Blood pressure (!) 124/91, pulse 62, temperature 98.2 F (36.8 C),  temperature source Temporal, resp. rate 17, height 5\' 5"  (1.651 m), weight 226 lb 11.2 oz (102.8 kg), last menstrual period 08/23/2017, SpO2 100 %.   ECOG: 0    General appearance: Alert, awake without any distress. Head: Atraumatic without abnormalities Oropharynx: Without any thrush or ulcers. Eyes: No scleral icterus. Lymph nodes: No lymphadenopathy noted in the cervical, supraclavicular, or axillary nodes Heart:regular rate and rhythm, without any murmurs or gallops.   Lung: Clear to auscultation without any rhonchi, wheezes or dullness to percussion. Abdomin: Soft, nontender without any shifting dullness or ascites. Musculoskeletal: No clubbing or cyanosis. Neurological: No motor or sensory  deficits. Skin: No rashes or lesions.   .    Lab Results: Lab Results  Component Value Date   WBC 10.1 07/20/2019   HGB 15.4 (H) 07/20/2019   HCT 48.4 (H) 07/20/2019   MCV 92.5 07/20/2019   PLT 344 07/20/2019     Chemistry      Component Value Date/Time   NA 141 07/20/2019 0049   NA 142 07/07/2019 1437   K 3.3 (L) 07/20/2019 0049   CL 104 07/20/2019 0049   CO2 23 07/20/2019 0049   BUN 13 07/20/2019 0049   BUN 12 07/07/2019 1437   CREATININE 0.94 07/20/2019 0049   CREATININE 0.98 06/22/2019 1102   CREATININE 0.88 06/01/2019 1556   GLU 87 08/30/2017 0000      Component Value Date/Time   CALCIUM 9.8 07/20/2019 0049   ALKPHOS 77 06/22/2019 1102   AST 27 06/22/2019 1102   ALT 40 06/22/2019 1102   BILITOT 0.5 06/22/2019 1102     IMPRESSION: 1. Postoperative changes about the RIGHT breast and axilla. No evidence of chest wall mass or lymphadenopathy. 2. No signs of metastatic disease to the chest at this time. 3. Hepatic cysts and hepatic steatosis. 4. Calcified coronary artery disease in LEFT coronary circulation as before.   Impression and Plan:  49 year old woman with:  1.  Moderate MR of the upper back diagnosed in 2019.  She presented with T2a N1  lesion that has been successfully resected.  The natural course of her disease were reviewed and risk of relapse was assessed.  PET CT scan obtained in November showed small left supraclavicular and axillary lymph node uptake.  CT scan obtained on Dec 22, 2019 did not show any lymphadenopathy indicating likely reactive lymph nodes rather than malignant etiology.  I have recommended continued active surveillance at this time with repeat PET scan in 6 months.  She is agreeable to continue at this time.   2.  Dermatology surveillance: She is continue to follow with dermatology at this time and she is up-to-date.  3.  Age-appropriate cancer screening: She is up-to-date on her mammography as well with last mammogram  obtained in March 2021.  She is up-to-date on her colonoscopy as well.  4.  Follow-up: In 6 months for a follow-up evaluation.  30  minutes were dedicated to this visit. The time was spent on reviewing laboratory data, imaging studies, discussing treatment options, and answering questions regarding future plan.   Zola Button, MD 5/18/20219:32 AM

## 2019-12-29 ENCOUNTER — Telehealth: Payer: Self-pay | Admitting: Oncology

## 2019-12-29 NOTE — Telephone Encounter (Signed)
Scheduled appt per 5/18 los.  Spoke with pt and she is aware of her appt dates and time,

## 2020-01-03 DIAGNOSIS — E7800 Pure hypercholesterolemia, unspecified: Secondary | ICD-10-CM | POA: Diagnosis not present

## 2020-01-03 DIAGNOSIS — I10 Essential (primary) hypertension: Secondary | ICD-10-CM | POA: Diagnosis not present

## 2020-01-03 DIAGNOSIS — Z5181 Encounter for therapeutic drug level monitoring: Secondary | ICD-10-CM | POA: Diagnosis not present

## 2020-01-03 LAB — COMPREHENSIVE METABOLIC PANEL
ALT: 22 IU/L (ref 0–32)
AST: 18 IU/L (ref 0–40)
Albumin/Globulin Ratio: 1.7 (ref 1.2–2.2)
Albumin: 4.3 g/dL (ref 3.8–4.8)
Alkaline Phosphatase: 91 IU/L (ref 48–121)
BUN/Creatinine Ratio: 12 (ref 9–23)
BUN: 10 mg/dL (ref 6–24)
Bilirubin Total: 0.3 mg/dL (ref 0.0–1.2)
CO2: 23 mmol/L (ref 20–29)
Calcium: 9.6 mg/dL (ref 8.7–10.2)
Chloride: 104 mmol/L (ref 96–106)
Creatinine, Ser: 0.85 mg/dL (ref 0.57–1.00)
GFR calc Af Amer: 92 mL/min/{1.73_m2} (ref >59)
GFR calc non Af Amer: 80 mL/min/{1.73_m2} (ref >59)
Globulin, Total: 2.6 g/dL (ref 1.5–4.5)
Glucose: 88 mg/dL (ref 65–99)
Potassium: 4.6 mmol/L (ref 3.5–5.2)
Sodium: 142 mmol/L (ref 134–144)
Total Protein: 6.9 g/dL (ref 6.0–8.5)

## 2020-01-03 LAB — LIPID PANEL
Chol/HDL Ratio: 2.1 ratio (ref 0.0–4.4)
Cholesterol, Total: 123 mg/dL (ref 100–199)
HDL: 60 mg/dL (ref >39)
LDL Chol Calc (NIH): 46 mg/dL (ref 0–99)
Triglycerides: 86 mg/dL (ref 0–149)
VLDL Cholesterol Cal: 17 mg/dL (ref 5–40)

## 2020-01-20 DIAGNOSIS — D2372 Other benign neoplasm of skin of left lower limb, including hip: Secondary | ICD-10-CM | POA: Diagnosis not present

## 2020-01-20 DIAGNOSIS — D2261 Melanocytic nevi of right upper limb, including shoulder: Secondary | ICD-10-CM | POA: Diagnosis not present

## 2020-01-20 DIAGNOSIS — C44712 Basal cell carcinoma of skin of right lower limb, including hip: Secondary | ICD-10-CM | POA: Diagnosis not present

## 2020-01-20 DIAGNOSIS — D225 Melanocytic nevi of trunk: Secondary | ICD-10-CM | POA: Diagnosis not present

## 2020-01-20 DIAGNOSIS — Z85820 Personal history of malignant melanoma of skin: Secondary | ICD-10-CM | POA: Diagnosis not present

## 2020-02-07 ENCOUNTER — Encounter: Payer: Self-pay | Admitting: Family Medicine

## 2020-02-13 ENCOUNTER — Other Ambulatory Visit: Payer: Self-pay | Admitting: Family Medicine

## 2020-02-13 DIAGNOSIS — G43809 Other migraine, not intractable, without status migrainosus: Secondary | ICD-10-CM

## 2020-02-20 ENCOUNTER — Other Ambulatory Visit: Payer: Self-pay | Admitting: Family Medicine

## 2020-02-20 DIAGNOSIS — G43809 Other migraine, not intractable, without status migrainosus: Secondary | ICD-10-CM

## 2020-02-21 DIAGNOSIS — C44712 Basal cell carcinoma of skin of right lower limb, including hip: Secondary | ICD-10-CM | POA: Diagnosis not present

## 2020-02-21 DIAGNOSIS — Z6838 Body mass index (BMI) 38.0-38.9, adult: Secondary | ICD-10-CM | POA: Diagnosis not present

## 2020-02-21 DIAGNOSIS — C779 Secondary and unspecified malignant neoplasm of lymph node, unspecified: Secondary | ICD-10-CM | POA: Diagnosis not present

## 2020-02-21 DIAGNOSIS — B070 Plantar wart: Secondary | ICD-10-CM | POA: Diagnosis not present

## 2020-02-21 DIAGNOSIS — C4359 Malignant melanoma of other part of trunk: Secondary | ICD-10-CM | POA: Diagnosis not present

## 2020-03-21 DIAGNOSIS — Z79899 Other long term (current) drug therapy: Secondary | ICD-10-CM | POA: Diagnosis not present

## 2020-03-21 DIAGNOSIS — H04123 Dry eye syndrome of bilateral lacrimal glands: Secondary | ICD-10-CM | POA: Diagnosis not present

## 2020-03-28 ENCOUNTER — Telehealth: Payer: Self-pay

## 2020-03-28 NOTE — Telephone Encounter (Signed)
Appt made for 08/25 to do paperwork for capabilities and limitations form

## 2020-03-28 NOTE — Telephone Encounter (Signed)
Patient needs form completed. Capabilities and limitations form.  She has been working from home since last year, but now her company is sending her area back in the building.  With her health concerns, past and present, possible future (?)  She is trying to protect herself by not going around a large number of people.  Patient said if Dr. Raoul Pitch needed her to come in for a visit, she is okay with scheduling an appt.  Please advise. Patient can be reached at [phone]

## 2020-04-05 ENCOUNTER — Encounter: Payer: Self-pay | Admitting: Family Medicine

## 2020-04-05 ENCOUNTER — Ambulatory Visit: Payer: BC Managed Care – PPO | Admitting: Family Medicine

## 2020-04-05 ENCOUNTER — Other Ambulatory Visit: Payer: Self-pay

## 2020-04-05 VITALS — BP 122/70 | HR 69 | Temp 97.8°F | Ht 65.0 in | Wt 233.2 lb

## 2020-04-05 DIAGNOSIS — Z7189 Other specified counseling: Secondary | ICD-10-CM | POA: Diagnosis not present

## 2020-04-05 DIAGNOSIS — Z0289 Encounter for other administrative examinations: Secondary | ICD-10-CM | POA: Diagnosis not present

## 2020-04-05 MED ORDER — ROSUVASTATIN CALCIUM 10 MG PO TABS: 10.0000 mg | ORAL_TABLET | Freq: Every day | ORAL | 3 refills | Status: DC

## 2020-04-05 NOTE — Patient Instructions (Signed)
We will call you when form is complete    Carpal tunnel brace is what I recommend.

## 2020-04-05 NOTE — Progress Notes (Signed)
This visit occurred during the SARS-CoV-2 public health emergency.  Safety protocols were in place, including screening questions prior to the visit, additional usage of staff PPE, and extensive cleaning of exam room while observing appropriate contact time as indicated for disinfecting solutions.    Shannon Quinn , [birth year], 49 y.o., female MRN: [id number] Patient Care Team    Relationship Specialty Notifications Start End  Ma Hillock, DO PCP - General Family Medicine  11/03/17   [name], MD PCP - Cardiology Cardiology  09/24/19   [facility]    11/03/17   Everlene Farrier, MD Consulting Physician Obstetrics and Gynecology  11/03/17   Jerrell Belfast, MD Consulting Physician Otolaryngology  11/03/17   Alda Berthold, DO Consulting Physician Neurology  11/03/17   Netta Cedars, MD Consulting Physician Orthopedic Surgery  06/01/19   Fanny Skates, MD Consulting Physician General Surgery  06/01/19     Chief Complaint  Patient presents with  . Follow-up    Pt would like Capabilities and limitaitons form. Pt would like to discuss recent fall and crush injury to hands. States they are doing worse.   . Medication Refill    Pt would like a refill of Crestor     Subjective: Pt presents for an OV today to discuss a capabilities and limitations form.  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  Unvaccinated personnel is still allowed to work from home.  Patient has great concerns on returning to work despite her vaccination status secondary to her chronic conditions of obesity, hypertension, hyperlipidemia and past medical history of melanoma skin cancer.  She is at a moderate level of NIOEV-03 risk of complication.  Their return to an office dated April 20, 2020.  Depression screen [facility] 2/9 04/05/2020 12/17/2019 06/01/2019  Decreased  Interest 1 0 0  Down, Depressed, Hopeless 1 1 0  PHQ - 2 Score 2 1 0  Altered sleeping 1 1 -  Tired, decreased energy 3 3 -  Change in appetite 1 0 -  Feeling bad or failure about yourself  3 1 -  Trouble concentrating 1 0 -  Moving slowly or fidgety/restless 3 0 -  Suicidal thoughts 0 0 -  PHQ-9 Score 14 6 -  Difficult doing work/chores Somewhat difficult Not difficult at all -    Allergies  Allergen Reactions  . Chlorhexidine Other (See Comments)    Red and purple burns and peeled skin. Cannot use surgical glue.  Pt states Betadine is safe and without reaction.  Marland Kitchen Neomycin     UNSPECIFIED REACTION   . Other Other (See Comments)    Body glue, turns skin blue  . Sulfamethoxazole-Trimethoprim     UNSPECIFIED REACTION   . Sulfasalazine Other (See Comments)  . Sulfonamide Derivatives     UNSPECIFIED REACTION   . Augmentin [Amoxicillin-Pot Clavulanate] Nausea And Vomiting    amox ok.   . Hydrocodone Nausea And Vomiting    Severe vomiting  . Oxycodone Nausea And Vomiting    Severe vomiting  . Prednisone Other (See Comments)    insomnia   Social History   Social History Narrative   Regular exercise: walking 1 mile per day   Caffeine use: soda drinker   Lives with husband and 2 children in a one story home.     Works for [employer] of  [place].  She works in Science writer   Education: associates degree.     Past Medical History:  Diagnosis Date  . Abnormality of right breast on screening mammogram 10/09/2018  . Allergy   . Colon polyps 2011   3 polpys removed  . Complication of anesthesia    "severe Reaction" "Physically ill"   . Deviated septum   . Family history of adverse reaction to anesthesia    violent when waking up from anesthesia - very sick  . Hypertension   . Hypoglycemic syndrome 1996   during pregnancy  . Hypothyroidism   . Lymphedema of right arm   . Melanoma ([place])    03/10/2018  . PONV (postoperative nausea and vomiting)     . Shoulder injury    right - side happened from last surgery  . Thyroid disease   . Varicose vein of leg    Past Surgical History:  Procedure Laterality Date  . ABDOMINAL HYSTERECTOMY    . BREAST LUMPECTOMY WITH RADIOACTIVE SEED LOCALIZATION Right 10/09/2018   Procedure: RIGHT BREAST LUMPECTOMY WITH RADIOACTIVE SEED LOCALIZATION ERAS PATHWAY;  Surgeon: Fanny Skates, MD;  Location: [facility];  Service: General;  Laterality: Right;  . COLONOSCOPY     polyps  . COLONOSCOPY    . LAPAROSCOPIC TOTAL HYSTERECTOMY  09/04/2017   Dr. Gaetano Net for endometriosis  . MELANOMA EXCISION WITH SENTINEL LYMPH NODE BIOPSY Left 04/02/2018   Procedure: INJECT BLUE DYE MELANOMA LEFT BACK WITH  WIDE LOCAL EXCISION MELANOMA LEFT BACK BILATERAL AXILLARY DEEP SENTINEL LYMPH NODE BIOPSY;  Surgeon: Fanny Skates, MD;  Location: [facility];  Service: General;  Laterality: Left;  . TUBAL LIGATION    . WISDOM TOOTH EXTRACTION     Family History  Problem Relation Age of Onset  . Arthritis Mother   . Alcohol abuse Father   . Arthritis Father   . Hyperlipidemia Father   . Stroke Father        2012  . Depression Father   . Diabetes Father   . Heart disease Father 19  . Heart failure Father   . Heart attack Father   . Diabetes Sister   . Dementia Maternal Grandmother   . Heart disease Maternal Grandfather 42  . CAD Maternal Grandfather   . Diabetes Paternal Grandmother   . COPD Paternal Grandfather        emphysema  . Cancer Paternal Uncle        breast cancer  . Colon cancer Paternal Uncle   . CAD Maternal Uncle   . Colon cancer Maternal Uncle   . CAD Paternal Aunt   . Cancer Maternal Uncle   . Esophageal cancer Neg Hx   . Stomach cancer Neg Hx   . Rectal cancer Neg Hx    Allergies as of 04/05/2020      Reactions   Chlorhexidine Other (See Comments)   Red and purple burns and peeled skin. Cannot use surgical glue.  Pt states Betadine is safe and without reaction.    Neomycin    UNSPECIFIED REACTION    Other Other (See Comments)   Body glue, turns skin blue   Sulfamethoxazole-trimethoprim    UNSPECIFIED REACTION    Sulfasalazine Other (See Comments)   Sulfonamide Derivatives    UNSPECIFIED REACTION    Augmentin [amoxicillin-pot Clavulanate] Nausea And Vomiting   amox ok.    Hydrocodone Nausea And Vomiting   Severe vomiting   Oxycodone Nausea And Vomiting  Severe vomiting   Prednisone Other (See Comments)   insomnia      Medication List       Accurate as of April 05, 2020  1:47 PM. If you have any questions, ask your nurse or doctor.        aspirin EC 81 MG tablet Take 1 tablet (81 mg total) by mouth daily.   fluticasone 50 MCG/ACT nasal spray Commonly known as: FLONASE Place 2 sprays into both nostrils daily.   hydrochlorothiazide 25 MG tablet Commonly known as: HYDRODIURIL Take 1 tablet (25 mg total) by mouth daily.   hydrOXYzine 25 MG capsule Commonly known as: Vistaril Take 1 capsule (25 mg total) by mouth 3 (three) times daily as needed.   Omega-3 1000 MG Caps Take 2,000 mg by mouth.   OVER THE COUNTER MEDICATION   rizatriptan 5 MG tablet Commonly known as: Maxalt Take 1 tablet (5 mg total) by mouth as needed for migraine. May repeat in 2 hours if needed   rosuvastatin 10 MG tablet Commonly known as: CRESTOR Take 1 tablet (10 mg total) by mouth daily.   topiramate 25 MG tablet Commonly known as: TOPAMAX Take 1 tablet (25 mg total) by mouth daily.   venlafaxine XR 75 MG 24 hr capsule Commonly known as: EFFEXOR-XR Take 1 capsule (75 mg total) by mouth daily.       All past medical history, surgical history, allergies, family history, immunizations andmedications were updated in the EMR today and reviewed under the history and medication portions of their EMR.     ROS: Negative, with the exception of above mentioned in HPI   Objective:  BP 122/70 (BP Location: Left Arm, Patient Position: Sitting)    Pulse 69   Temp 97.8 F (36.6 C)   Ht 5\' 5"  (1.651 m)   Wt 233 lb 3.2 oz (105.8 kg)   LMP 08/23/2017   SpO2 99%   BMI 38.81 kg/m  Body mass index is 38.81 kg/m. Gen: Afebrile. No acute distress. Nontoxic in appearance, well developed, well nourished.  HENT: AT. [place]. Eyes:Pupils Equal Round Reactive to light, Extraocular movements intact,  Conjunctiva without redness, discharge or icterus. CV: RRR  Chest: CTAB, no wheeze or crackles. Good air movement, normal resp effort.  Abd: Soft.  Obese.  NTND. BS present. Neuro:  Normal gait. PERLA. EOMi. Alert. Oriented x3  Psych: Normal affect, dress and demeanor. Normal speech. Normal thought content and judgment.  No exam data present No results found. No results found for this or any previous visit (from the past 24 hour(s)).  Assessment/Plan: Contrina Orona is a 49 y.o. female present for OV for  Encounter for completion of form with patient/covid counseling.  Agree with patient.  She should certainly be considered to continue to work from home given her chronic conditions, despite vaccine status, especially since unvaccinated personnel are still able to work from home. I have written a letter for her to allow continued work from home status.  I will fill out her capabilities and limitations forms, however I do not feel that is the proper forms for these particular issues.   Reviewed expectations re: course of current medical issues.  Discussed self-management of symptoms.  Outlined signs and symptoms indicating need for more acute intervention.  Patient verbalized understanding and all questions were answered.  Patient received an After-Visit Summary.    No orders of the defined types were placed in this encounter.  No orders of the defined types were placed in  this encounter.  Referral Orders  No referral(s) requested today     Note is dictated utilizing voice recognition software. Although note has been proof read prior  to signing, occasional typographical errors still can be missed. If any questions arise, please do not hesitate to call for verification.   electronically signed by:  Howard Pouch, DO  [place]

## 2020-04-09 ENCOUNTER — Telehealth: Payer: Self-pay | Admitting: Family Medicine

## 2020-04-09 ENCOUNTER — Encounter: Payer: Self-pay | Admitting: Family Medicine

## 2020-04-09 NOTE — Telephone Encounter (Signed)
Work form and letter completed and placed in [place] work [employer].  Please complete lower half of form with address and office stamp please. Please inform patient that have been completed make a copy for her for her records.  Fax originals.  And scan a copy into chart.   Thanks.

## 2020-04-10 NOTE — Telephone Encounter (Signed)
Notified pt that forms were faxed 04/10/20. Sent to scan

## 2020-05-11 ENCOUNTER — Telehealth (INDEPENDENT_AMBULATORY_CARE_PROVIDER_SITE_OTHER): Payer: BC Managed Care – PPO | Admitting: Family Medicine

## 2020-05-11 ENCOUNTER — Encounter: Payer: Self-pay | Admitting: Family Medicine

## 2020-05-11 DIAGNOSIS — R21 Rash and other nonspecific skin eruption: Secondary | ICD-10-CM | POA: Diagnosis not present

## 2020-05-11 MED ORDER — PREDNISONE 10 MG PO TABS: ORAL_TABLET | ORAL | 0 refills | Status: DC

## 2020-05-11 NOTE — Patient Instructions (Signed)
-  I sent the medication(s) we discussed to your pharmacy: Meds ordered this encounter  Medications  . predniSONE (DELTASONE) 10 MG tablet    Sig: 5 tablets (50mg ) daily for 3 days, then 4 tablets (40 mg) daily for 3 days, then 3 tablets (30mg ) daily for 3 days, then 2 tablets (20 mg) daily for 3 days, then 1 tablet (10 mg) daily for 3 days.    Dispense:  45 tablet    Refill:  0    I hope you are feeling better soon! Seek in person care promptly if your symptoms worsen, new concerns arise or you are not improving with treatment over the next few days.  Please note: I only do telemedicine visits for [facility] [place] on Tuesdays and Thursdays.  If you have any further questions or concerns following this visit today, please make a follow-up appointment with your primary care doctor or an urgent care.

## 2020-05-11 NOTE — Progress Notes (Signed)
Virtual Visit via Telephone Note  I connected with [name] on 05/11/20 at  4:40 PM EDT by telephone and verified that I am speaking with the correct person using two identifiers.   I discussed the limitations, risks, security and privacy concerns of performing an evaluation and management service by telephone and the availability of in person appointments. I also discussed with the patient that there may be a patient responsible charge related to this service. The patient expressed understanding and agreed to proceed.  Location patient: home, [place] Location provider: work or home office Participants present for the call: patient, provider Patient did not have a visit in the prior 7 days to address this/these issue(s).   History of Present Illness:   Acute telemedicine visit for rash: -Onset: 4-5 days ago after working outside with poison ivy vines - one hit her in the face -Symptoms include: very itchy papulovesicular lesions on face near lip, nose, near eye, bilateral and several spots on her lower legs near feet as well -Denies: lesion on mucus membranes, swelling -Has tried: Over-the-counter creams, but is afraid to use this near the eyes -Pertinent past medical history: Poison ivy, has taken prednisone in the past -Pertinent medication allergies: Prednisone is listed on her allergy/intolerance list, however she reports she can take this and that it simply interfered with her sleep.   Observations/Objective: Patient sounds cheerful and well on the phone. I do not appreciate any SOB. Speech and thought processing are grossly intact. Patient reported vitals:  Assessment and Plan:  Skin rash  -we discussed possible serious and likely etiologies, options for evaluation and workup, limitations of telemedicine visit vs in person visit, treatment, treatment risks and precautions. Pt prefers to treat via telemedicine empirically rather than in person at this moment.  I did  advise that seeing the rash would be better, however she was not able to get the video visit working and preferred to talk by phone rather than canceling the appointment.  She feels pretty certain that she has poison ivy, as she was working with poison ivy vines and 1 of these did brush her against the face.  Given lesions in multiple body areas and on the face, the treatment is typically a taper of oral prednisone.  She reports she can take prednisone without issue, except some sleep disturbance.  She prefers to try this.  Denies any allergy to this medication.  Advised to seek prompt follow up in person care if worsening, new symptoms arise, or if is not improving with treatment over the next few days. Did let the patient know that I only do telemedicine shifts for [facility] on Tuesdays and Thursdays and advised a follow up visit with PCP or at an [facility] if has further questions or concerns.   Follow Up Instructions:  I did not refer this patient for an OV in the next 24 hours for this/these issue(s).  I discussed the assessment and treatment plan with the patient. The patient was provided an opportunity to ask questions and all were answered. The patient agreed with the plan and demonstrated an understanding of the instructions.   I provided 19 minutes of non-face-to-face time during this encounter.   Lucretia Kern, DO

## 2020-05-15 ENCOUNTER — Other Ambulatory Visit: Payer: Self-pay | Admitting: Family Medicine

## 2020-05-15 NOTE — Telephone Encounter (Signed)
Patient had reaction to steroids given to her for poison ivy on face   Please call regarding med reaction to mark in her chart. [phone].  Patient stated that she needs refill on  topiramate (TOPAMAX) 25 MG tablet [[id number]] Was told by pharmacy to call pcp office if needed prescription expedited.  She needs before 48 hours.  If we can call today it would be awesome!  Thank you!

## 2020-05-16 MED ORDER — TOPIRAMATE 50 MG PO TABS: 50.0000 mg | ORAL_TABLET | Freq: Every day | ORAL | 0 refills | Status: DC

## 2020-05-16 NOTE — Telephone Encounter (Signed)
Spoke with pt and pt is currently taking 50 mg of Topamax. Pt was prescribed 25 mg QD per last note on 5/7, but on 3/26 pt was informed to take 25 mg for 7 days then inc to 50 mg. I do not see where pt was informed to switch but to cont 25 mg. Please advise for clarification. Pt was advise to take 25 mg at the moment until heard further instructions. 30 tabs sent to pharmacy, pt appt sched for 10/18.

## 2020-05-16 NOTE — Telephone Encounter (Signed)
Patient following up on prescription status.   Patient scheduled appt with Dr. Raoul Pitch on 10/19.  Dr. Raoul Pitch changed dosage to pills daily for  topiramate (TOPAMAX) 25 MG tablet [[id number]]   She is out of meds.  She has been trying since Friday 10/1 to get filled.  No one told her why it was denying fill.  She also needs   rizatriptan (MAXALT) 5 MG tablet [[id number]]    No refills.  My error for not adding to original note yesterday.   [facility]   Can we please rush this thru for her?

## 2020-05-16 NOTE — Telephone Encounter (Signed)
Spoke with pharmacy, pt picked up medication 5/7 and again on 7/14. Pt is out but picked up meds for 30 days earlier.

## 2020-05-16 NOTE — Telephone Encounter (Signed)
Pt aware.

## 2020-05-16 NOTE — Telephone Encounter (Signed)
Patient reported she was taking 25 mg of Topamax last visit.  If she is now tapered up to the 50 mg dose that is okay.  I have called in a prescription for Topamax 50 mg daily she is to only take 1 pill now since it is 50 mg/pill.  Keep upcoming follow-up appointment

## 2020-05-30 ENCOUNTER — Encounter: Payer: Self-pay | Admitting: Family Medicine

## 2020-05-30 ENCOUNTER — Ambulatory Visit (INDEPENDENT_AMBULATORY_CARE_PROVIDER_SITE_OTHER): Payer: BC Managed Care – PPO | Admitting: Family Medicine

## 2020-05-30 ENCOUNTER — Other Ambulatory Visit: Payer: Self-pay

## 2020-05-30 VITALS — BP 96/66 | HR 58 | Temp 98.2°F | Ht 65.75 in | Wt 232.0 lb

## 2020-05-30 DIAGNOSIS — Z23 Encounter for immunization: Secondary | ICD-10-CM | POA: Diagnosis not present

## 2020-05-30 DIAGNOSIS — I10 Essential (primary) hypertension: Secondary | ICD-10-CM

## 2020-05-30 DIAGNOSIS — I2510 Atherosclerotic heart disease of native coronary artery without angina pectoris: Secondary | ICD-10-CM | POA: Diagnosis not present

## 2020-05-30 DIAGNOSIS — N951 Menopausal and female climacteric states: Secondary | ICD-10-CM | POA: Diagnosis not present

## 2020-05-30 DIAGNOSIS — F419 Anxiety disorder, unspecified: Secondary | ICD-10-CM

## 2020-05-30 DIAGNOSIS — Z8249 Family history of ischemic heart disease and other diseases of the circulatory system: Secondary | ICD-10-CM | POA: Diagnosis not present

## 2020-05-30 DIAGNOSIS — F32A Depression, unspecified: Secondary | ICD-10-CM

## 2020-05-30 DIAGNOSIS — G43809 Other migraine, not intractable, without status migrainosus: Secondary | ICD-10-CM

## 2020-05-30 MED ORDER — VENLAFAXINE HCL ER 75 MG PO CP24: 75.0000 mg | ORAL_CAPSULE | Freq: Every day | ORAL | 1 refills | Status: DC

## 2020-05-30 MED ORDER — RIZATRIPTAN BENZOATE 5 MG PO TABS: 5.0000 mg | ORAL_TABLET | ORAL | 5 refills | Status: DC | PRN

## 2020-05-30 MED ORDER — ONDANSETRON HCL 4 MG PO TABS: 4.0000 mg | ORAL_TABLET | Freq: Three times a day (TID) | ORAL | 5 refills | Status: DC | PRN

## 2020-05-30 MED ORDER — HYDROXYZINE HCL 10 MG PO TABS: 10.0000 mg | ORAL_TABLET | Freq: Two times a day (BID) | ORAL | 5 refills | Status: DC | PRN

## 2020-05-30 MED ORDER — HYDROCHLOROTHIAZIDE 25 MG PO TABS: 25.0000 mg | ORAL_TABLET | Freq: Every day | ORAL | 1 refills | Status: DC

## 2020-05-30 MED ORDER — TOPIRAMATE 50 MG PO TABS: 50.0000 mg | ORAL_TABLET | Freq: Every day | ORAL | 1 refills | Status: DC

## 2020-05-30 MED ORDER — HYDROXYZINE PAMOATE 25 MG PO CAPS: 25.0000 mg | ORAL_CAPSULE | Freq: Three times a day (TID) | ORAL | 1 refills | Status: DC | PRN

## 2020-05-30 NOTE — Progress Notes (Signed)
This visit occurred during the SARS-CoV-2 public health emergency.  Safety protocols were in place, including screening questions prior to the visit, additional usage of staff PPE, and extensive cleaning of exam room while observing appropriate contact time as indicated for disinfecting solutions.    Shannon Quinn , [birth year], 48 y.o., female MRN: [id number] Patient Care Team    Relationship Specialty Notifications Start End  Ma Hillock, DO PCP - General Family Medicine  11/03/17   [name], MD PCP - Cardiology Cardiology  09/24/19   [facility]    11/03/17   Everlene Farrier, MD Consulting Physician Obstetrics and Gynecology  11/03/17   Jerrell Belfast, MD Consulting Physician Otolaryngology  11/03/17   Alda Berthold, DO Consulting Physician Neurology  11/03/17   Netta Cedars, MD Consulting Physician Orthopedic Surgery  06/01/19   Fanny Skates, MD Consulting Physician General Surgery  06/01/19     Chief Complaint  Patient presents with  . Follow-up    [facility]     Subjective: Shannon Quinn is a 49 y.o. female present for [facility] Anxiety and depression/Hot flashes due to menopause Pt reports she is doing well on effexor 75 mg QD- no complaints.    Hypertension/Fhx heart disease Pt reports compliance with HCTZ. Patient denies chest pain, shortness of breath, dizziness or lower extremity edema.  Pt does  take  daily baby ASA. Pt is  prescribed statin. She and her family have started a mediterranean diet ant home. RF: HTN, obesity, FH heart disease and MI at 30 in father.  Migraine:   She states her headaches have greatly improved on topamax. She reports compliance with Topamax 50 mg. She has not needed to take the maxalt since starting. The noise in her ear/head has resolved. Prior note:  Pt presents for an OV with complaints of migraine for 3 days.  Patient reports she is having another migraine.  Last migraine December 03/2019.  She reports this  migraine is similar with hearing a ringing in her head, light sensitivity, nausea present.  She did take some leftover Zofran on Saturday which was very helpful.  However on Sunday the nausea remained despite Zofran use.  She did not take the Vistaril.  Patient has not had a history of more chronic migraines until her December migraine.  She reports having watery eyes prior to onset of this migraine.  She is receiving steroid injections for her chronic musculoskeletal/joint pain.  She does endorse complete resolution of all symptoms between her migraines from December until now.  She does report decreasing her red meat production over the last few months.  She reports her mother has a history of vertigo.  Her sister had vertigo with a "ear tumor.  "  Depression screen [facility] 2/9 05/30/2020 04/05/2020 12/17/2019 06/01/2019  Decreased Interest 1 1 0 0  Down, Depressed, Hopeless 1 1 1  0  PHQ - 2 Score 2 2 1  0  Altered sleeping 0 1 1 -  Tired, decreased energy 1 3 3  -  Change in appetite 0 1 0 -  Feeling bad or failure about yourself  2 3 1  -  Trouble concentrating 0 1 0 -  Moving slowly or fidgety/restless 1 3 0 -  Suicidal thoughts 0 0 0 -  PHQ-9 Score 6 14 6  -  Difficult doing work/chores - Somewhat difficult Not difficult at all -    Allergies  Allergen Reactions  . Chlorhexidine Other (See Comments)    Red  and purple burns and peeled skin. Cannot use surgical glue.  Pt states Betadine is safe and without reaction.  Marland Kitchen Neomycin     UNSPECIFIED REACTION   . Other Other (See Comments)    Body glue, turns skin blue  . Sulfamethoxazole-Trimethoprim     UNSPECIFIED REACTION   . Sulfasalazine Other (See Comments)  . Sulfonamide Derivatives     UNSPECIFIED REACTION   . Augmentin [Amoxicillin-Pot Clavulanate] Nausea And Vomiting    amox ok.   . Hydrocodone Nausea And Vomiting    Severe vomiting  . Oxycodone Nausea And Vomiting    Severe vomiting  . Prednisone Other (See Comments)    insomnia    Social History   Social History Narrative   Regular exercise: walking 1 mile per day   Caffeine use: soda drinker   Lives with husband and 2 children in a one story home.     Works for [employer].  She works in Science writer   Education: associates degree.     Past Medical History:  Diagnosis Date  . Abnormality of right breast on screening mammogram 10/09/2018  . Allergy   . Atypical chest pain 06/07/2019  . Colon polyps 2011   3 polpys removed  . Complication of anesthesia    "severe Reaction" "Physically ill"   . Deviated septum   . Family history of adverse reaction to anesthesia    violent when waking up from anesthesia - very sick  . Hypertension   . Hypoglycemic syndrome 1996   during pregnancy  . Hypothyroidism   . Impingement syndrome of right shoulder region 06/04/2018  . Lymphedema of right arm   . Melanoma ([place])    03/10/2018  . PONV (postoperative nausea and vomiting)   . Shoulder injury    right - side happened from last surgery  . Thyroid disease   . Varicose vein of leg    Past Surgical History:  Procedure Laterality Date  . ABDOMINAL HYSTERECTOMY    . BREAST LUMPECTOMY WITH RADIOACTIVE SEED LOCALIZATION Right 10/09/2018   Procedure: RIGHT BREAST LUMPECTOMY WITH RADIOACTIVE SEED LOCALIZATION ERAS PATHWAY;  Surgeon: Fanny Skates, MD;  Location: [place];  Service: General;  Laterality: Right;  . COLONOSCOPY     polyps  . COLONOSCOPY    . LAPAROSCOPIC TOTAL HYSTERECTOMY  09/04/2017   Dr. Gaetano Net for endometriosis  . MELANOMA EXCISION WITH SENTINEL LYMPH NODE BIOPSY Left 04/02/2018   Procedure: INJECT BLUE DYE MELANOMA LEFT BACK WITH  WIDE LOCAL EXCISION MELANOMA LEFT BACK BILATERAL AXILLARY DEEP SENTINEL LYMPH NODE BIOPSY;  Surgeon: Fanny Skates, MD;  Location: [facility];  Service: General;  Laterality: Left;  . TUBAL LIGATION    . WISDOM TOOTH EXTRACTION     Family History   Problem Relation Age of Onset  . Arthritis Mother   . Alcohol abuse Father   . Arthritis Father   . Hyperlipidemia Father   . Stroke Father        2012  . Depression Father   . Diabetes Father   . Heart disease Father 5  . Heart failure Father   . Heart attack Father   . Diabetes Sister   . Dementia Maternal Grandmother   . Heart disease Maternal Grandfather 42  . CAD Maternal Grandfather   . Diabetes Paternal Grandmother   . COPD Paternal Grandfather        emphysema  . Cancer Paternal Uncle  breast cancer  . Colon cancer Paternal Uncle   . CAD Maternal Uncle   . Colon cancer Maternal Uncle   . CAD Paternal Aunt   . Congestive Heart Failure Paternal Aunt   . Kidney failure Paternal Aunt   . Cancer Maternal Uncle   . Esophageal cancer Neg Hx   . Stomach cancer Neg Hx   . Rectal cancer Neg Hx    Allergies as of 05/30/2020      Reactions   Chlorhexidine Other (See Comments)   Red and purple burns and peeled skin. Cannot use surgical glue.  Pt states Betadine is safe and without reaction.   Neomycin    UNSPECIFIED REACTION    Other Other (See Comments)   Body glue, turns skin blue   Sulfamethoxazole-trimethoprim    UNSPECIFIED REACTION    Sulfasalazine Other (See Comments)   Sulfonamide Derivatives    UNSPECIFIED REACTION    Augmentin [amoxicillin-pot Clavulanate] Nausea And Vomiting   amox ok.    Hydrocodone Nausea And Vomiting   Severe vomiting   Oxycodone Nausea And Vomiting   Severe vomiting   Prednisone Other (See Comments)   insomnia      Medication List       Accurate as of May 30, 2020  8:35 AM. If you have any questions, ask your nurse or doctor.        STOP taking these medications   fluticasone 50 MCG/ACT nasal spray Commonly known as: FLONASE Stopped by: Howard Pouch, DO   predniSONE 10 MG tablet Commonly known as: DELTASONE Stopped by: Howard Pouch, DO     TAKE these medications   aspirin EC 81 MG tablet Take 1 tablet  (81 mg total) by mouth daily.   hydrochlorothiazide 25 MG tablet Commonly known as: HYDRODIURIL Take 1 tablet (25 mg total) by mouth daily.   hydrOXYzine 25 MG capsule Commonly known as: Vistaril Take 1 capsule (25 mg total) by mouth 3 (three) times daily as needed.   Omega-3 1000 MG Caps Take 2,000 mg by mouth.   ondansetron 4 MG tablet Commonly known as: ZOFRAN Take 1 tablet (4 mg total) by mouth every 8 (eight) hours as needed for nausea or vomiting. Started by: Howard Pouch, DO   OVER THE COUNTER MEDICATION   rizatriptan 5 MG tablet Commonly known as: Maxalt Take 1 tablet (5 mg total) by mouth as needed for migraine. May repeat in 2 hours if needed   rosuvastatin 10 MG tablet Commonly known as: CRESTOR Take 1 tablet (10 mg total) by mouth daily.   topiramate 50 MG tablet Commonly known as: TOPAMAX Take 1 tablet (50 mg total) by mouth daily.   venlafaxine XR 75 MG 24 hr capsule Commonly known as: EFFEXOR-XR Take 1 capsule (75 mg total) by mouth daily.       All past medical history, surgical history, allergies, family history, immunizations andmedications were updated in the EMR today and reviewed under the history and medication portions of their EMR.     ROS: Negative, with the exception of above mentioned in HPI   Objective:  BP 96/66   Pulse (!) 58   Temp 98.2 F (36.8 C) (Oral)   Ht 5' 5.75" (1.67 m)   Wt 232 lb (105.2 kg)   LMP 08/23/2017   SpO2 98%   BMI 37.73 kg/m  Body mass index is 37.73 kg/m. Gen: Afebrile. No acute distress.  HENT: AT. [place].  Eyes:Pupils Equal Round Reactive to light, Extraocular movements intact,  Conjunctiva without redness, discharge or icterus. CV: RRR no murmur, no edema, +2/4 P posterior tibialis pulses Chest: CTAB, no wheeze or crackles Abd: Soft. NTND. BS present Neuro: Normal gait. PERLA. EOMi. Alert. Oriented x3  Psych: Normal affect, dress and demeanor. Normal speech. Normal thought content and judgment.    No  exam data present No results found. No results found for this or any previous visit (from the past 24 hour(s)).  Assessment/Plan: Shannon Quinn is a 49 y.o. female present for OV for  Worsening headaches in increased frequency She is doing well.  Continue topamax 50 mg QD continue B12 and magnesium supplements. continue Maxalt as needed - MR Brain W Wo Contrast> WNL Follow-up 5.5 months (as CPE)  Anxiety and depression/Hot flashes due to menopause stable continue effexor 75  Hypertension/Fhx heart disease:  Stable.  Continue  hctz 25 Low-sodium diet, routine exercise.  Influenza vaccine administered today   Reviewed expectations re: course of current medical issues.  Discussed self-management of symptoms.  Outlined signs and symptoms indicating need for more acute intervention.  Patient verbalized understanding and all questions were answered.  Patient received an After-Visit Summary.  f/u 5.5 mos for CPE/CMC  Orders Placed This Encounter  Procedures  . Flu Vaccine QUAD 6+ mos PF IM (Fluarix Quad PF)   Referral Orders  No referral(s) requested today   Meds ordered this encounter  Medications  . ondansetron (ZOFRAN) 4 MG tablet    Sig: Take 1 tablet (4 mg total) by mouth every 8 (eight) hours as needed for nausea or vomiting.    Dispense:  20 tablet    Refill:  5  . venlafaxine XR (EFFEXOR-XR) 75 MG 24 hr capsule    Sig: Take 1 capsule (75 mg total) by mouth daily.    Dispense:  90 capsule    Refill:  1  . topiramate (TOPAMAX) 50 MG tablet    Sig: Take 1 tablet (50 mg total) by mouth daily.    Dispense:  90 tablet    Refill:  1    Discontinue prior dose. Please inform pt each pill is 50mg  and to just take one  . rizatriptan (MAXALT) 5 MG tablet    Sig: Take 1 tablet (5 mg total) by mouth as needed for migraine. May repeat in 2 hours if needed    Dispense:  10 tablet    Refill:  5  . hydrOXYzine (VISTARIL) 25 MG capsule    Sig: Take 1 capsule (25 mg  total) by mouth 3 (three) times daily as needed.    Dispense:  90 capsule    Refill:  1  . hydrochlorothiazide (HYDRODIURIL) 25 MG tablet    Sig: Take 1 tablet (25 mg total) by mouth daily.    Dispense:  90 tablet    Refill:  1     Note is dictated utilizing voice recognition software. Although note has been proof read prior to signing, occasional typographical errors still can be missed. If any questions arise, please do not hesitate to call for verification.   electronically signed by:  Howard Pouch, DO  [place]

## 2020-05-30 NOTE — Patient Instructions (Addendum)
Nice to see you today.  Next appt end of March - schedule as your physical. We will cover your chronic conditions as well at same appt and preventive screenings. We will collect fasting labs at that appt.

## 2020-06-08 ENCOUNTER — Telehealth: Payer: Self-pay

## 2020-06-08 NOTE — Telephone Encounter (Signed)
Lattie Haw from [employer] (patients employer) was calling regarding patients medical accommodations for patient to work from home due to her medical history.  Lattie Haw is requesting clarification on patient's condition for her to continue to work from home.  I told Lattie Haw I would have someone from Dr. Lucita Lora clinical team call tomorrow.  Lattie Haw [phone]

## 2020-06-09 NOTE — Telephone Encounter (Signed)
LVM for pt to CB in regards message from employer. Would like pt ok to provided any detailed information in regards her [place].   FYI. Forms does not explain any details in regards to pt Shannon Quinn on why she must work from home. Scanned in 03/2020

## 2020-06-09 NOTE — Telephone Encounter (Signed)
Rec'd pt okay to her employer. Please advise on which chronic conditions is necessary.

## 2020-06-09 NOTE — Telephone Encounter (Signed)
Agree, Would need approval from pt. Prior to providing additional information.  Thanks.

## 2020-06-12 NOTE — Telephone Encounter (Signed)
Spoke with Lattie Haw, who stated she will need to know the accommodations/conditions needed for her to work from home.

## 2020-06-12 NOTE — Telephone Encounter (Signed)
Spoke with Lattie Haw and informed her of the information below. Pt is currently under surveillance per Oncologist note and follows every 6 mos.

## 2020-06-12 NOTE — Telephone Encounter (Signed)
Patient is at higher risk of complications to Covid than the average population due to her hypertension, obesity and malignant melanoma.

## 2020-06-25 DIAGNOSIS — D225 Melanocytic nevi of trunk: Secondary | ICD-10-CM | POA: Diagnosis not present

## 2020-06-25 DIAGNOSIS — L739 Follicular disorder, unspecified: Secondary | ICD-10-CM | POA: Diagnosis not present

## 2020-06-28 ENCOUNTER — Ambulatory Visit (HOSPITAL_COMMUNITY)
Admission: RE | Admit: 2020-06-28 | Discharge: 2020-06-28 | Disposition: A | Discharge status: Home / Self Care | Payer: BC Managed Care – PPO | Source: Ambulatory Visit | Attending: Oncology | Admitting: Oncology

## 2020-06-28 ENCOUNTER — Inpatient Hospital Stay: Payer: BC Managed Care – PPO | Attending: Oncology

## 2020-06-28 ENCOUNTER — Other Ambulatory Visit: Payer: Self-pay

## 2020-06-28 DIAGNOSIS — C4359 Malignant melanoma of other part of trunk: Secondary | ICD-10-CM | POA: Diagnosis not present

## 2020-06-28 DIAGNOSIS — C439 Malignant melanoma of skin, unspecified: Secondary | ICD-10-CM | POA: Diagnosis not present

## 2020-06-28 DIAGNOSIS — Z85820 Personal history of malignant melanoma of skin: Secondary | ICD-10-CM | POA: Insufficient documentation

## 2020-06-28 LAB — CMP (CANCER CENTER ONLY)
ALT: 35 U/L (ref 0–44)
AST: 26 U/L (ref 15–41)
Albumin: 3.7 g/dL (ref 3.5–5.0)
Alkaline Phosphatase: 85 U/L (ref 38–126)
Anion gap: 9 (ref 5–15)
BUN: 14 mg/dL (ref 6–20)
CO2: 24 mmol/L (ref 22–32)
Calcium: 9.5 mg/dL (ref 8.9–10.3)
Chloride: 110 mmol/L (ref 98–111)
Creatinine: 0.96 mg/dL (ref 0.44–1.00)
GFR, Estimated: >60 mL/min (ref >60)
Glucose, Bld: 97 mg/dL (ref 70–99)
Potassium: 4.5 mmol/L (ref 3.5–5.1)
Sodium: 143 mmol/L (ref 135–145)
Total Bilirubin: 0.3 mg/dL (ref 0.3–1.2)
Total Protein: 7 g/dL (ref 6.5–8.1)

## 2020-06-28 LAB — CBC WITH DIFFERENTIAL (CANCER CENTER ONLY)
Abs Immature Granulocytes: 0.01 10*3/uL (ref 0.00–0.07)
Basophils Absolute: 0.1 10*3/uL (ref 0.0–0.1)
Basophils Relative: 1 %
Eosinophils Absolute: 0.1 10*3/uL (ref 0.0–0.5)
Eosinophils Relative: 2 %
HCT: 41.9 % (ref 36.0–46.0)
Hemoglobin: 13.4 g/dL (ref 12.0–15.0)
Immature Granulocytes: 0 %
Lymphocytes Relative: 31 %
Lymphs Abs: 1.9 10*3/uL (ref 0.7–4.0)
MCH: 29.3 pg (ref 26.0–34.0)
MCHC: 32 g/dL (ref 30.0–36.0)
MCV: 91.5 fL (ref 80.0–100.0)
Monocytes Absolute: 0.4 10*3/uL (ref 0.1–1.0)
Monocytes Relative: 6 %
Neutro Abs: 3.6 10*3/uL (ref 1.7–7.7)
Neutrophils Relative %: 60 %
Platelet Count: 273 10*3/uL (ref 150–400)
RBC: 4.58 MIL/uL (ref 3.87–5.11)
RDW: 14.2 % (ref 11.5–15.5)
WBC Count: 6 10*3/uL (ref 4.0–10.5)
nRBC: 0 % (ref 0.0–0.2)

## 2020-06-28 LAB — GLUCOSE, CAPILLARY: Glucose-Capillary: 92 mg/dL (ref 70–99)

## 2020-06-28 MED ORDER — FLUDEOXYGLUCOSE F - 18 (FDG) INJECTION
11.8900 | Freq: Once | INTRAVENOUS | Status: AC
  Administered 2020-06-28: 11.89 via INTRAVENOUS

## 2020-07-05 ENCOUNTER — Inpatient Hospital Stay: Payer: BC Managed Care – PPO | Admitting: Oncology

## 2020-07-05 ENCOUNTER — Other Ambulatory Visit: Payer: Self-pay

## 2020-07-05 VITALS — BP 117/63 | HR 69 | Temp 97.8°F | Resp 20 | Wt 229.7 lb

## 2020-07-05 DIAGNOSIS — C439 Malignant melanoma of skin, unspecified: Secondary | ICD-10-CM | POA: Diagnosis not present

## 2020-07-05 NOTE — Progress Notes (Signed)
Hematology and Oncology Follow Up Visit  Shannon Quinn [id number] 08/31/1970 49 y.o. 07/05/2020 10:01 AM Shannon Quinn, Shannon Quinn, Shannon Quinn, Shannon A, DO   Principle Diagnosis: 49 year old woman with T2AN1 melanoma of the upper back diagnosed in 2019.  She was found to have with 1 out of 10 lymph node involved with 0.1 mm involvement in the lymph node.   Prior Therapy: She underwent wide excision with bilateral sentinel lymph node biopsy of the axilla.  The final pathology obtained on April 02, 2018 showed Quinn residual melanoma of 0.8 mm T staging was T2a.  She had bilateral lymph node sentinel lymph node biopsy with 1 out of 10 lymph nodes were positive for melanoma.  Less than 0.1 mm tiny focus of melanoma seen on 1 of those 10 lymph nodes.   Current therapy: Active surveillance.  Interim History: Ms. Rena returns today for Quinn follow-up.  Since the last visit, she reports no major changes in her health.  She continues to be active and attends activities of daily living without any decline in ability to do so.  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  She also developed migraine headaches which caused some nausea and vomiting.            Medications: Updated on review. Current Outpatient Medications  Medication Sig Dispense Refill  . aspirin EC 81 MG tablet Take 1 tablet (81 mg total) by mouth daily. 90 tablet 3  . hydrochlorothiazide (HYDRODIURIL) 25 MG tablet Take 1 tablet (25 mg total) by mouth daily. 90 tablet 1  . hydrOXYzine (ATARAX/VISTARIL) 10 MG tablet Take 1-3 tablets (10-30 mg total) by mouth 2 (two) times daily as needed. 90 tablet 5  . Omega-3 1000 MG CAPS Take 2,000 mg by mouth.    . ondansetron (ZOFRAN) 4 MG tablet Take 1 tablet (4 mg total) by mouth every 8 (eight) hours as needed for nausea or vomiting. 20 tablet 5  . OVER THE COUNTER MEDICATION     . rizatriptan (MAXALT) 5 MG tablet Take 1 tablet (5 mg total) by mouth as needed for migraine. May  repeat in 2 hours if needed 10 tablet 5  . rosuvastatin (CRESTOR) 10 MG tablet Take 1 tablet (10 mg total) by mouth daily. 90 tablet 3  . topiramate (TOPAMAX) 50 MG tablet Take 1 tablet (50 mg total) by mouth daily. 90 tablet 1  . venlafaxine XR (EFFEXOR-XR) 75 MG 24 hr capsule Take 1 capsule (75 mg total) by mouth daily. 90 capsule 1   No current facility-administered medications for this visit.     Allergies:  Allergies  Allergen Reactions  . Chlorhexidine Other (See Comments)    Red and purple burns and peeled skin. Cannot use surgical glue.  Pt states Betadine is safe and without reaction.  Marland Kitchen Neomycin     UNSPECIFIED REACTION   . Other Other (See Comments)    Body glue, turns skin blue  . Sulfamethoxazole-Trimethoprim     UNSPECIFIED REACTION   . Sulfasalazine Other (See Comments)  . Sulfonamide Derivatives     UNSPECIFIED REACTION   . Augmentin [Amoxicillin-Pot Clavulanate] Nausea And Vomiting    amox ok.   . Hydrocodone Nausea And Vomiting    Severe vomiting  . Oxycodone Nausea And Vomiting    Severe vomiting  . Prednisone Other (See Comments)    insomnia        Physical Exam:  Blood pressure 117/63, pulse 69, temperature 97.8 F (36.6  C), temperature source Tympanic, resp. rate 20, weight 229 lb 11.2 oz (104.2 kg), last menstrual period 08/23/2017, SpO2 99 %.    ECOG: 0   General appearance: Comfortable appearing without any discomfort Head: Normocephalic without any trauma Oropharynx: Mucous membranes are moist and pink without any thrush or ulcers. Eyes: Pupils are equal and round reactive to light. Lymph nodes: No cervical, supraclavicular, inguinal or axillary lymphadenopathy.   Heart:regular rate and rhythm.  S1 and S2 without leg edema. Lung: Clear without any rhonchi or wheezes.  No dullness to percussion. Abdomin: Soft, nontender, nondistended with good bowel sounds.  No hepatosplenomegaly. Musculoskeletal: No joint deformity or effusion.  Full  range of motion noted. Neurological: No deficits noted on motor, sensory and deep tendon reflex exam. Skin: No petechial rash or dryness.  Appeared moist.         Lab Results: Lab Results  Component Value Date   WBC 6.0 06/28/2020   HGB 13.4 06/28/2020   HCT 41.9 06/28/2020   MCV 91.5 06/28/2020   PLT 273 06/28/2020     Chemistry      Component Value Date/Time   NA 143 06/28/2020 1001   NA 142 01/03/2020 1151   K 4.5 06/28/2020 1001   CL 110 06/28/2020 1001   CO2 24 06/28/2020 1001   BUN 14 06/28/2020 1001   BUN 10 01/03/2020 1151   CREATININE 0.96 06/28/2020 1001   CREATININE 0.88 06/01/2019 1556   GLU 87 08/30/2017 0000      Component Value Date/Time   CALCIUM 9.5 06/28/2020 1001   ALKPHOS 85 06/28/2020 1001   AST 26 06/28/2020 1001   ALT 35 06/28/2020 1001   BILITOT 0.3 06/28/2020 1001      IMPRESSION: 1. No findings to suggest residual/recurrence FDG avid tumor or metastatic disease. 2. Nonobstructing left renal calculi. 3. Mild hepatic steatosis 4. Liver cysts.  Impression and Plan:  50 year old woman with:  1.  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   The natural course of her disease was reviewed and risk of relapse was assessed at this time.  PET CT scan obtained on June 28, 2020 was reviewed and showed no evidence of any metastatic disease.  Risks and benefits of continued active surveillance were discussed and treatment options in case of disease relapse was reviewed.  Immunotherapy remains the best option to treat first-line if she has metastatic disease developed.  This will be in the form of single agent versus combination of ipilimumab and nivolumab.   2.  Dermatology surveillance: I recommended continued dermatology follow-up at this time and she is up-to-date.  3.  Age-appropriate cancer screening: She is currently up-to-date and her next mammogram scheduled for March 2022.  4.  Follow-up: In 6  months for repeat follow-up and repeat imaging studies 12 months.  30  minutes were spent on this encounter.  Time was dedicated to reviewing her disease status, reviewing imaging studies and future plan of care reviewed.   Zola Button, MD 11/24/202110:01 AM

## 2020-07-25 DIAGNOSIS — Z85828 Personal history of other malignant neoplasm of skin: Secondary | ICD-10-CM | POA: Diagnosis not present

## 2020-07-25 DIAGNOSIS — L821 Other seborrheic keratosis: Secondary | ICD-10-CM | POA: Diagnosis not present

## 2020-07-25 DIAGNOSIS — L820 Inflamed seborrheic keratosis: Secondary | ICD-10-CM | POA: Diagnosis not present

## 2020-07-25 DIAGNOSIS — Z85820 Personal history of malignant melanoma of skin: Secondary | ICD-10-CM | POA: Diagnosis not present

## 2020-07-25 DIAGNOSIS — D485 Neoplasm of uncertain behavior of skin: Secondary | ICD-10-CM | POA: Diagnosis not present

## 2020-07-25 DIAGNOSIS — D2262 Melanocytic nevi of left upper limb, including shoulder: Secondary | ICD-10-CM | POA: Diagnosis not present

## 2020-08-21 ENCOUNTER — Other Ambulatory Visit: Payer: Self-pay | Admitting: General Surgery

## 2020-08-21 DIAGNOSIS — Z1231 Encounter for screening mammogram for malignant neoplasm of breast: Secondary | ICD-10-CM

## 2020-08-29 ENCOUNTER — Telehealth: Payer: Self-pay | Admitting: Family Medicine

## 2020-08-29 NOTE — Telephone Encounter (Signed)
Patient states she woke up with migraine and nausea. She vomited and felt bile go through her nostril and felt like it went into her ear canals also. Patient wonders if this will cause problems and what she should do. Please call patient to advise.

## 2020-08-29 NOTE — Telephone Encounter (Signed)
Please advise 

## 2020-08-30 NOTE — Telephone Encounter (Signed)
Spoke with pt who stated that she has seen some improvement. Pt was given instructions from provider.

## 2020-08-30 NOTE — Telephone Encounter (Signed)
Hope she is feeling better today.  Would encourage her  to rinse out nostrils with nasal saline.  This really should not be much of an issue in the way of complications.  Just feels awful.

## 2020-08-31 DIAGNOSIS — D1723 Benign lipomatous neoplasm of skin and subcutaneous tissue of right leg: Secondary | ICD-10-CM | POA: Diagnosis not present

## 2020-09-01 DIAGNOSIS — C439 Malignant melanoma of skin, unspecified: Secondary | ICD-10-CM | POA: Diagnosis not present

## 2020-09-01 DIAGNOSIS — C4359 Malignant melanoma of other part of trunk: Secondary | ICD-10-CM | POA: Diagnosis not present

## 2020-09-01 DIAGNOSIS — C779 Secondary and unspecified malignant neoplasm of lymph node, unspecified: Secondary | ICD-10-CM | POA: Diagnosis not present

## 2020-09-17 DIAGNOSIS — Z13220 Encounter for screening for lipoid disorders: Secondary | ICD-10-CM | POA: Diagnosis not present

## 2020-09-17 DIAGNOSIS — Z713 Dietary counseling and surveillance: Secondary | ICD-10-CM | POA: Diagnosis not present

## 2020-10-12 ENCOUNTER — Ambulatory Visit (INDEPENDENT_AMBULATORY_CARE_PROVIDER_SITE_OTHER): Payer: BC Managed Care – PPO | Admitting: Family Medicine

## 2020-10-12 ENCOUNTER — Encounter: Payer: Self-pay | Admitting: Family Medicine

## 2020-10-12 ENCOUNTER — Other Ambulatory Visit: Payer: Self-pay

## 2020-10-12 VITALS — BP 134/82 | HR 72 | Temp 98.1°F | Ht 67.5 in | Wt 226.0 lb

## 2020-10-12 DIAGNOSIS — G43809 Other migraine, not intractable, without status migrainosus: Secondary | ICD-10-CM | POA: Diagnosis not present

## 2020-10-12 DIAGNOSIS — R112 Nausea with vomiting, unspecified: Secondary | ICD-10-CM

## 2020-10-12 DIAGNOSIS — G43009 Migraine without aura, not intractable, without status migrainosus: Secondary | ICD-10-CM | POA: Diagnosis not present

## 2020-10-12 MED ORDER — RIZATRIPTAN BENZOATE 5 MG PO TABS: 5.0000 mg | ORAL_TABLET | ORAL | 5 refills | Status: DC | PRN

## 2020-10-12 MED ORDER — ONDANSETRON HCL 4 MG/2ML IJ SOLN
4.0000 mg | Freq: Once | INTRAMUSCULAR | Status: AC
  Administered 2020-10-12: 4 mg via INTRAMUSCULAR

## 2020-10-12 MED ORDER — METHYLPREDNISOLONE 4 MG PO TBPK: ORAL_TABLET | ORAL | 0 refills | Status: DC

## 2020-10-12 MED ORDER — ONDANSETRON HCL 4 MG PO TABS: 4.0000 mg | ORAL_TABLET | Freq: Three times a day (TID) | ORAL | 5 refills | Status: DC | PRN

## 2020-10-12 MED ORDER — DIPHENHYDRAMINE HCL 50 MG/ML IJ SOLN
25.0000 mg | Freq: Once | INTRAMUSCULAR | Status: AC
  Administered 2020-10-12: 25 mg via INTRAVENOUS

## 2020-10-12 MED ORDER — PROMETHAZINE HCL 25 MG RE SUPP: 25.0000 mg | Freq: Four times a day (QID) | RECTAL | 0 refills | Status: DC | PRN

## 2020-10-12 MED ORDER — TOPIRAMATE 50 MG PO TABS: 50.0000 mg | ORAL_TABLET | Freq: Every day | ORAL | 1 refills | Status: DC

## 2020-10-12 MED ORDER — KETOROLAC TROMETHAMINE 30 MG/ML IJ SOLN
60.0000 mg | Freq: Once | INTRAMUSCULAR | Status: AC
  Administered 2020-10-12: 60 mg via INTRAMUSCULAR

## 2020-10-12 NOTE — Progress Notes (Signed)
This visit occurred during the SARS-CoV-2 public health emergency.  Safety protocols were in place, including screening questions prior to the visit, additional usage of staff PPE, and extensive cleaning of exam room while observing appropriate contact time as indicated for disinfecting solutions.    Shannon Quinn , Aug 16, 1970, 50 y.o., female MRN: [id number] Patient Care Team    Relationship Specialty Notifications Start End  Ma Hillock, DO PCP - General Family Medicine  11/03/17   [name], MD PCP - Cardiology Cardiology  09/24/19   [facility]    11/03/17   Everlene Farrier, MD Consulting Physician Obstetrics and Gynecology  11/03/17   Jerrell Belfast, MD Consulting Physician Otolaryngology  11/03/17   Alda Berthold, DO Consulting Physician Neurology  11/03/17   Netta Cedars, MD Consulting Physician Orthopedic Surgery  06/01/19   Fanny Skates, MD Consulting Physician General Surgery  06/01/19     Chief Complaint  Patient presents with  . Migraine    With n/v/d     Subjective: Pt presents for an OV with complaints of migraine headache that started yesterday evening.  She attempted to take Zofran and Vistaril but has been unable to keep anything on her stomach.  She has been unable to hold down water today.  She was able to eat a small amount of lunch yesterday before onset of the majority of her symptoms.  She endorses her typical migraine pattern of pain with light sensitivity, nausea and vomiting.  Ironically, she had a migraine of this caliber last year almost to the date.  She had an MRI 11/27/2019 that was normal.  She was started on Topamax 50 mg daily and this has greatly improved her migraines.  Depression screen [facility] 2/9 10/12/2020 05/30/2020 04/05/2020 12/17/2019 06/01/2019  Decreased Interest 0 1 1 0 0  Down, Depressed, Hopeless 0 1 1 1  0  PHQ - 2 Score 0 2 2 1  0  Altered sleeping 1 0 1 1 -  Tired, decreased energy 1 1 3 3  -  Change in  appetite 0 0 1 0 -  Feeling bad or failure about yourself  0 2 3 1  -  Trouble concentrating 1 0 1 0 -  Moving slowly or fidgety/restless 3 1 3  0 -  Suicidal thoughts 0 0 0 0 -  PHQ-9 Score 6 6 14 6  -  Difficult doing work/chores - - Somewhat difficult Not difficult at all -    Allergies  Allergen Reactions  . Chlorhexidine Other (See Comments)    Red and purple burns and peeled skin. Cannot use surgical glue.  Pt states Betadine is safe and without reaction.  Marland Kitchen Neomycin     UNSPECIFIED REACTION   . Other Other (See Comments)    Body glue, turns skin blue  . Sulfamethoxazole-Trimethoprim     UNSPECIFIED REACTION   . Sulfasalazine Other (See Comments)  . Sulfonamide Derivatives     UNSPECIFIED REACTION   . Augmentin [Amoxicillin-Pot Clavulanate] Nausea And Vomiting    amox ok.   . Hydrocodone Nausea And Vomiting    Severe vomiting  . Oxycodone Nausea And Vomiting    Severe vomiting  . Prednisone Other (See Comments)    insomnia   Social History   Social History Narrative   Regular exercise: walking 1 mile per day   Caffeine use: soda drinker   Lives with husband and 2 children in a one story home.     Works for [employer]  [place].  She works in Science writer   Education: associates degree.     Past Medical History:  Diagnosis Date  . Abnormality of right breast on screening mammogram 10/09/2018  . Allergy   . Atypical chest pain 06/07/2019  . Colon polyps 2011   3 polpys removed  . Complication of anesthesia    "severe Reaction" "Physically ill"   . Deviated septum   . Family history of adverse reaction to anesthesia    violent when waking up from anesthesia - very sick  . Hypertension   . Hypoglycemic syndrome 1996   during pregnancy  . Hypothyroidism   . Impingement syndrome of right shoulder region 06/04/2018  . Lymphedema of right arm   . Melanoma ([place])    03/10/2018  . PONV (postoperative nausea and vomiting)   . Shoulder  injury    right - side happened from last surgery  . Thyroid disease   . Varicose vein of leg    Past Surgical History:  Procedure Laterality Date  . ABDOMINAL HYSTERECTOMY    . BREAST LUMPECTOMY WITH RADIOACTIVE SEED LOCALIZATION Right 10/09/2018   Procedure: RIGHT BREAST LUMPECTOMY WITH RADIOACTIVE SEED LOCALIZATION ERAS PATHWAY;  Surgeon: Fanny Skates, MD;  Location: [place];  Service: General;  Laterality: Right;  . COLONOSCOPY     polyps  . COLONOSCOPY    . LAPAROSCOPIC TOTAL HYSTERECTOMY  09/04/2017   Dr. Gaetano Net for endometriosis  . MELANOMA EXCISION WITH SENTINEL LYMPH NODE BIOPSY Left 04/02/2018   Procedure: INJECT BLUE DYE MELANOMA LEFT BACK WITH  WIDE LOCAL EXCISION MELANOMA LEFT BACK BILATERAL AXILLARY DEEP SENTINEL LYMPH NODE BIOPSY;  Surgeon: Fanny Skates, MD;  Location: [place];  Service: General;  Laterality: Left;  . TUBAL LIGATION    . WISDOM TOOTH EXTRACTION     Family History  Problem Relation Age of Onset  . Arthritis Mother   . Alcohol abuse Father   . Arthritis Father   . Hyperlipidemia Father   . Stroke Father        2012  . Depression Father   . Diabetes Father   . Heart disease Father 54  . Heart failure Father   . Heart attack Father   . Diabetes Sister   . Dementia Maternal Grandmother   . Heart disease Maternal Grandfather 42  . CAD Maternal Grandfather   . Diabetes Paternal Grandmother   . COPD Paternal Grandfather        emphysema  . Cancer Paternal Uncle        breast cancer  . Colon cancer Paternal Uncle   . CAD Maternal Uncle   . Colon cancer Maternal Uncle   . CAD Paternal Aunt   . Congestive Heart Failure Paternal Aunt   . Kidney failure Paternal Aunt   . Cancer Maternal Uncle   . Esophageal cancer Neg Hx   . Stomach cancer Neg Hx   . Rectal cancer Neg Hx    Allergies as of 10/12/2020      Reactions   Chlorhexidine Other (See Comments)   Red and purple burns and peeled skin. Cannot use  surgical glue.  Pt states Betadine is safe and without reaction.   Neomycin    UNSPECIFIED REACTION    Other Other (See Comments)   Body glue, turns skin blue   Sulfamethoxazole-trimethoprim    UNSPECIFIED REACTION    Sulfasalazine Other (See Comments)   Sulfonamide Derivatives    UNSPECIFIED REACTION  Augmentin [amoxicillin-pot Clavulanate] Nausea And Vomiting   amox ok.    Hydrocodone Nausea And Vomiting   Severe vomiting   Oxycodone Nausea And Vomiting   Severe vomiting   Prednisone Other (See Comments)   insomnia      Medication List       Accurate as of October 12, 2020 11:30 AM. If you have any questions, ask your nurse or doctor.        aspirin EC 81 MG tablet Take 1 tablet (81 mg total) by mouth daily.   hydrochlorothiazide 25 MG tablet Commonly known as: HYDRODIURIL Take 1 tablet (25 mg total) by mouth daily.   hydrOXYzine 10 MG tablet Commonly known as: ATARAX/VISTARIL Take 1-3 tablets (10-30 mg total) by mouth 2 (two) times daily as needed.   Omega-3 1000 MG Caps Take 2,000 mg by mouth.   ondansetron 4 MG tablet Commonly known as: ZOFRAN Take 1-2 tablets (4-8 mg total) by mouth every 8 (eight) hours as needed for nausea or vomiting. What changed: how much to take Changed by: Howard Pouch, DO   OVER THE COUNTER MEDICATION   rizatriptan 5 MG tablet Commonly known as: Maxalt Take 1 tablet (5 mg total) by mouth as needed for migraine. May repeat in 2 hours if needed   rosuvastatin 10 MG tablet Commonly known as: CRESTOR Take 1 tablet (10 mg total) by mouth daily.   topiramate 50 MG tablet Commonly known as: TOPAMAX Take 1 tablet (50 mg total) by mouth daily.   venlafaxine XR 75 MG 24 hr capsule Commonly known as: EFFEXOR-XR Take 1 capsule (75 mg total) by mouth daily.       All past medical history, surgical history, allergies, family history, immunizations andmedications were updated in the EMR today and reviewed under the history and  medication portions of their EMR.     ROS: Negative, with the exception of above mentioned in HPI   Objective:  BP 134/82   Pulse 72   Temp 98.1 F (36.7 C) (Oral)   Ht 5' 7.5" (1.715 m)   Wt 226 lb (102.5 kg)   LMP 08/23/2017   SpO2 98%   BMI 34.87 kg/m  Body mass index is 34.87 kg/m. Gen: Afebrile.  Nauseated and vomiting and wheeze paper basket. HENT: AT. [place].  Eyes:Pupils Equal Round Reactive to light, Extraocular movements intact,  Conjunctiva without redness, discharge or icterus.  Light sensitivity present. Neuro: Normal gait. PERLA. EOMi. Alert. Oriented x3  No exam data present No results found. No results found for this or any previous visit (from the past 24 hour(s)).  Assessment/Plan: Shannon Quinn is a 50 y.o. female present for OV for  Migraine without aura and without status migrainosus, not intractable/nausea vomiting Migraine cocktail IM provided today. She was encouraged to hydrate and rest the remainder of the day. - ondansetron (ZOFRAN) injection 4 mg - diphenhydrAMINE (BENADRYL) injection 25 mg - ketorolac (TORADOL) 30 MG/ML injection 60 mg -Continue Maxalt as needed -Continue Vistaril as needed -Continue Topamax -Continue Zofran 4 mg every 8 hours as needed -Start Phenergan suppositories every 8 hours as needed if unable to tolerate p.o. only. -Medrol Dosepak to start tomorrow. Follow-up in 1 week and we can discuss other types of therapy for her migraines.   Reviewed expectations re: course of current medical issues.  Discussed self-management of symptoms.  Outlined signs and symptoms indicating need for more acute intervention.  Patient verbalized understanding and all questions were answered.  Patient received an After-Visit Summary.   >  72 Minutes was dedicated to this patient's encounter to include pre-visit review of chart, face-to-face time with patient and post-visit work- which include documentation and prescribing medications and/or  ordering test when necessary.     No orders of the defined types were placed in this encounter.  Meds ordered this encounter  Medications  . ondansetron (ZOFRAN) 4 MG tablet    Sig: Take 1-2 tablets (4-8 mg total) by mouth every 8 (eight) hours as needed for nausea or vomiting.    Dispense:  40 tablet    Refill:  5  . rizatriptan (MAXALT) 5 MG tablet    Sig: Take 1 tablet (5 mg total) by mouth as needed for migraine. May repeat in 2 hours if needed    Dispense:  10 tablet    Refill:  5  . topiramate (TOPAMAX) 50 MG tablet    Sig: Take 1 tablet (50 mg total) by mouth daily.    Dispense:  90 tablet    Refill:  1   Referral Orders  No referral(s) requested today     Note is dictated utilizing voice recognition software. Although note has been proof read prior to signing, occasional typographical errors still can be missed. If any questions arise, please do not hesitate to call for verification.   electronically signed by:  Howard Pouch, DO  [place]

## 2020-10-12 NOTE — Patient Instructions (Addendum)
I have called in phenergan suppositories for you in the event you need an anti-nausea medication and are not able to tolerate the zofran. This will make you sleepy.   Start steroid today.  I have refilled your zofran.   Migraine cocktail today.

## 2020-10-18 ENCOUNTER — Other Ambulatory Visit: Payer: Self-pay

## 2020-10-19 ENCOUNTER — Encounter: Payer: Self-pay | Admitting: Family Medicine

## 2020-10-19 ENCOUNTER — Ambulatory Visit: Payer: BC Managed Care – PPO | Admitting: Family Medicine

## 2020-10-19 VITALS — BP 112/74 | HR 78 | Temp 97.9°F | Ht 68.0 in | Wt 231.0 lb

## 2020-10-19 DIAGNOSIS — G43009 Migraine without aura, not intractable, without status migrainosus: Secondary | ICD-10-CM

## 2020-10-19 MED ORDER — AMITRIPTYLINE HCL 10 MG PO TABS: 10.0000 mg | ORAL_TABLET | Freq: Every day | ORAL | 3 refills | Status: DC

## 2020-10-19 NOTE — Progress Notes (Signed)
This visit occurred during the SARS-CoV-2 public health emergency.  Safety protocols were in place, including screening questions prior to the visit, additional usage of staff PPE, and extensive cleaning of exam room while observing appropriate contact time as indicated for disinfecting solutions.    Shannon Quinn , October 25, 1970, 50 y.o., female MRN: [id number] Patient Care Team    Relationship Specialty Notifications Start End  Ma Hillock, DO PCP - General Family Medicine  11/03/17   [name], MD PCP - Cardiology Cardiology  09/24/19   [facility]    11/03/17   Everlene Farrier, MD Consulting Physician Obstetrics and Gynecology  11/03/17   Jerrell Belfast, MD Consulting Physician Otolaryngology  11/03/17   Alda Berthold, DO Consulting Physician Neurology  11/03/17   Netta Cedars, MD Consulting Physician Orthopedic Surgery  06/01/19   Fanny Skates, MD Consulting Physician General Surgery  06/01/19     Chief Complaint  Patient presents with  . Migraine    Much improved      Subjective: Pt presents for an OV to follow up on migraine headache and prevention.  Patient was seen 1 week ago with a rather significant migraine.  She is provided with a migraine cocktail of Benadryl 25, Zofran 4 and Toradol.  She states her headache resolved over the following 24 hours after being seen.  She feels like her headaches are coming more frequently approximately once a week.  Bad headaches such as like when she was seen last week are approximately both 3-4-year.  She states that she is even late to taking her Topamax her headaches will start.  She does not feel she gets the warning signs of a migraine like she has in the past.  She feels that it comes on rather quickly now.  She also endorses the time for recovery after her migraines seems to be longer.  She is able to catch the migraine early enough Maxalt is helpful.  Prior note:  with complaints of migraine headache  that started yesterday evening.  She attempted to take Zofran and Vistaril but has been unable to keep anything on her stomach.  She has been unable to hold down water today.  She was able to eat a small amount of lunch yesterday before onset of the majority of her symptoms.  She endorses her typical migraine pattern of pain with light sensitivity, nausea and vomiting.  Ironically, she had a migraine of this caliber last year almost to the date.  She had an MRI 11/27/2019 that was normal.  She was started on Topamax 50 mg daily and this has greatly improved her migraines.  Depression screen [facility] 2/9 10/12/2020 05/30/2020 04/05/2020 12/17/2019 06/01/2019  Decreased Interest 0 1 1 0 0  Down, Depressed, Hopeless 0 1 1 1  0  PHQ - 2 Score 0 2 2 1  0  Altered sleeping 1 0 1 1 -  Tired, decreased energy 1 1 3 3  -  Change in appetite 0 0 1 0 -  Feeling bad or failure about yourself  0 2 3 1  -  Trouble concentrating 1 0 1 0 -  Moving slowly or fidgety/restless 3 1 3  0 -  Suicidal thoughts 0 0 0 0 -  PHQ-9 Score 6 6 14 6  -  Difficult doing work/chores - - Somewhat difficult Not difficult at all -    Allergies  Allergen Reactions  . Chlorhexidine Other (See Comments)    Red and purple burns and  peeled skin. Cannot use surgical glue.  Pt states Betadine is safe and without reaction.  Marland Kitchen Neomycin     UNSPECIFIED REACTION   . Other Other (See Comments)    Body glue, turns skin blue  . Sulfamethoxazole-Trimethoprim     UNSPECIFIED REACTION   . Sulfasalazine Other (See Comments)  . Sulfonamide Derivatives     UNSPECIFIED REACTION   . Augmentin [Amoxicillin-Pot Clavulanate] Nausea And Vomiting    amox ok.   . Hydrocodone Nausea And Vomiting    Severe vomiting  . Oxycodone Nausea And Vomiting    Severe vomiting  . Prednisone Other (See Comments)    insomnia   Social History   Social History Narrative   Regular exercise: walking 1 mile per day   Caffeine use: soda drinker   Lives with husband and 2  children in a one story home.     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     Past Medical History:  Diagnosis Date  . Abnormality of right breast on screening mammogram 10/09/2018  . Allergy   . Atypical chest pain 06/07/2019  . Colon polyps 2011   3 polpys removed  . Complication of anesthesia    "severe Reaction" "Physically ill"   . Deviated septum   . Family history of adverse reaction to anesthesia    violent when waking up from anesthesia - very sick  . Hypertension   . Hypoglycemic syndrome 1996   during pregnancy  . Hypothyroidism   . Impingement syndrome of right shoulder region 06/04/2018  . Lymphedema of right arm   . Melanoma ([place])    03/10/2018  . PONV (postoperative nausea and vomiting)   . Shoulder injury    right - side happened from last surgery  . Thyroid disease   . Varicose vein of leg    Past Surgical History:  Procedure Laterality Date  . ABDOMINAL HYSTERECTOMY    . BREAST LUMPECTOMY WITH RADIOACTIVE SEED LOCALIZATION Right 10/09/2018   Procedure: RIGHT BREAST LUMPECTOMY WITH RADIOACTIVE SEED LOCALIZATION ERAS PATHWAY;  Surgeon: Fanny Skates, MD;  Location: [place];  Service: General;  Laterality: Right;  . COLONOSCOPY     polyps  . COLONOSCOPY    . LAPAROSCOPIC TOTAL HYSTERECTOMY  09/04/2017   Dr. Gaetano Net for endometriosis  . MELANOMA EXCISION WITH SENTINEL LYMPH NODE BIOPSY Left 04/02/2018   Procedure: INJECT BLUE DYE MELANOMA LEFT BACK WITH  WIDE LOCAL EXCISION MELANOMA LEFT BACK BILATERAL AXILLARY DEEP SENTINEL LYMPH NODE BIOPSY;  Surgeon: Fanny Skates, MD;  Location: [place];  Service: General;  Laterality: Left;  . TUBAL LIGATION    . WISDOM TOOTH EXTRACTION     Family History  Problem Relation Age of Onset  . Arthritis Mother   . Alcohol abuse Father   . Arthritis Father   . Hyperlipidemia Father   . Stroke Father         2012  . Depression Father   . Diabetes Father   . Heart disease Father 7  . Heart failure Father   . Heart attack Father   . Diabetes Sister   . Dementia Maternal Grandmother   . Heart disease Maternal Grandfather 42  . CAD Maternal Grandfather   . Diabetes Paternal Grandmother   . COPD Paternal Grandfather        emphysema  . Cancer Paternal Uncle  breast cancer  . Colon cancer Paternal Uncle   . CAD Maternal Uncle   . Colon cancer Maternal Uncle   . CAD Paternal Aunt   . Congestive Heart Failure Paternal Aunt   . Kidney failure Paternal Aunt   . Cancer Maternal Uncle   . Esophageal cancer Neg Hx   . Stomach cancer Neg Hx   . Rectal cancer Neg Hx    Allergies as of 10/19/2020      Reactions   Chlorhexidine Other (See Comments)   Red and purple burns and peeled skin. Cannot use surgical glue.  Pt states Betadine is safe and without reaction.   Neomycin    UNSPECIFIED REACTION    Other Other (See Comments)   Body glue, turns skin blue   Sulfamethoxazole-trimethoprim    UNSPECIFIED REACTION    Sulfasalazine Other (See Comments)   Sulfonamide Derivatives    UNSPECIFIED REACTION    Augmentin [amoxicillin-pot Clavulanate] Nausea And Vomiting   amox ok.    Hydrocodone Nausea And Vomiting   Severe vomiting   Oxycodone Nausea And Vomiting   Severe vomiting   Prednisone Other (See Comments)   insomnia      Medication List       Accurate as of October 19, 2020 12:31 PM. If you have any questions, ask your nurse or doctor.        STOP taking these medications   methylPREDNISolone 4 MG Tbpk tablet Commonly known as: MEDROL DOSEPAK Stopped by: Howard Pouch, DO     TAKE these medications   amitriptyline 10 MG tablet Commonly known as: ELAVIL Take 1-3 tablets (10-30 mg total) by mouth at bedtime. Started by: Howard Pouch, DO   aspirin EC 81 MG tablet Take 1 tablet (81 mg total) by mouth daily.   hydrochlorothiazide 25 MG tablet Commonly known as:  HYDRODIURIL Take 1 tablet (25 mg total) by mouth daily.   hydrOXYzine 10 MG tablet Commonly known as: ATARAX/VISTARIL Take 1-3 tablets (10-30 mg total) by mouth 2 (two) times daily as needed.   Omega-3 1000 MG Caps Take 2,000 mg by mouth.   ondansetron 4 MG tablet Commonly known as: ZOFRAN Take 1-2 tablets (4-8 mg total) by mouth every 8 (eight) hours as needed for nausea or vomiting.   OVER THE COUNTER MEDICATION   promethazine 25 MG suppository Commonly known as: Phenergan Place 1 suppository (25 mg total) rectally every 6 (six) hours as needed for nausea or vomiting.   rizatriptan 5 MG tablet Commonly known as: Maxalt Take 1 tablet (5 mg total) by mouth as needed for migraine. May repeat in 2 hours if needed   rosuvastatin 10 MG tablet Commonly known as: CRESTOR Take 1 tablet (10 mg total) by mouth daily.   topiramate 50 MG tablet Commonly known as: TOPAMAX Take 1 tablet (50 mg total) by mouth daily.   venlafaxine XR 75 MG 24 hr capsule Commonly known as: EFFEXOR-XR Take 1 capsule (75 mg total) by mouth daily.       All past medical history, surgical history, allergies, family history, immunizations andmedications were updated in the EMR today and reviewed under the history and medication portions of their EMR.     ROS: Negative, with the exception of above mentioned in HPI   Objective:  BP 112/74   Pulse 78   Temp 97.9 F (36.6 C) (Oral)   Ht 5\' 8"  (1.727 m)   Wt 231 lb (104.8 kg)   LMP 08/23/2017   SpO2 96%  BMI 35.12 kg/m  Body mass index is 35.12 kg/m. Gen: Afebrile. No acute distress.  Nontoxic.  Very pleasant female HENT: AT. [place]. Eyes:Pupils Equal Round Reactive to light, Extraocular movements intact,  Conjunctiva without redness, discharge or icterus. CV: RRR  Chest: CTAB, no wheeze or crackle Neuro: Normal gait. PERLA. EOMi. Alert. Oriented m x3 Psych: Normal affect, dress and demeanor. Normal speech. Normal thought content and  judgment..  No exam data present No results found. No results found for this or any previous visit (from the past 24 hour(s)).  Assessment/Plan: Shannon Quinn is a 50 y.o. female present for OV for  Migraine without aura and without status migrainosus, not intractabl Much improved today however chronic migraines are occurring once a week.  Migraines with severe symptoms such as when last seen are approximately 3-4 times a year. Start amitriptyline 10 mg nightly.  Patient was instructed on tapering up to 30 mg nightly max dose as needed with weekly taper Continue Maxalt as needed Continue Vistaril as needed Continue Topamax Continue Zofran 4 mg every 8 hours as needed Continue Phenergan suppositories every 8 hours as needed if unable to tolerate p.o. only.  During migraine headaches If unable to increase headache days, will refer to neurology.   Reviewed expectations re: course of current medical issues.  Discussed self-management of symptoms.  Outlined signs and symptoms indicating need for more acute intervention.  Patient verbalized understanding and all questions were answered.  Patient received an After-Visit Summary.    No orders of the defined types were placed in this encounter.  Meds ordered this encounter  Medications  . amitriptyline (ELAVIL) 10 MG tablet    Sig: Take 1-3 tablets (10-30 mg total) by mouth at bedtime.    Dispense:  90 tablet    Refill:  3   Referral Orders  No referral(s) requested today     Note is dictated utilizing voice recognition software. Although note has been proof read prior to signing, occasional typographical errors still can be missed. If any questions arise, please do not hesitate to call for verification.   electronically signed by:  Howard Pouch, DO  [place]

## 2020-10-19 NOTE — Patient Instructions (Signed)
Chronic Migraine Headache A migraine is a type of headache that is usually stronger and more sudden than other headaches. Migraines are characterized by an intense pulsing, throbbing pain that is usually only present on one side of the head. Migraine pain usually gets worse with activity. Migraines can cause nausea, vomiting, sensitivity to light and sound, and vision changes. Migraines that keep coming back are called recurrent migraines. A migraine is called a chronic migraine if it happens at least 15 days in a month for more than 3 months. Talk with your health care provider about what things may bring on (trigger) your migraines. What are the causes? The exact cause of this condition is not known. However, a migraine may be caused when nerves in the brain become irritated and release chemicals that cause inflammation of blood vessels. The inflammation of the blood vessels causes pain. Migraines may be triggered or caused by:  Smoking.  Certain foods and drinks, such as: ? Aged cheese. ? Chocolate. ? Alcohol. ? Caffeine. ? Foods or drinks that contain nitrates, glutamate, aspartame, MSG, or tyramine.  Medicines, such as birth control pills or some blood pressure medicines. Other things that may trigger a migraine include:  Menstruation.  Emotional stress.  Lack of sleep or too much sleep.  Tiredness (fatigue).  Bright lights or loud noises.  Odors.  Weather changes and high altitude. What increases the risk? The following factors may make you more likely to experience chronic migraine:  Having migraines or a family history of migraines.  Having a mental health condition, such as depression or anxiety.  Having to take a lot of pain medicine.  Having sleep problems.  Having heart disease, diabetes, or obesity. What are the signs or symptoms? Symptoms of a migraine vary for each person and may include:  Pulsating or throbbing pain.  Pain that is usually only present  on one side of the head. In some cases, the pain may be on both sides of the head or around the head or neck.  Severe pain that prevents you from doing daily activities.  Pain that gets worse with physical activity.  Nausea, vomiting, or both.  Pain with exposure to bright lights, loud noises, or activity.  General sensitivity to bright lights, loud noises, or smells.  Dizziness. A sign that a migraine is becoming chronic is an increasing number of migraine episodes. It is considered chronic if the migraine happens at least 15 days in a month for more than 3 months. How is this diagnosed? This condition is often diagnosed based on:  Your symptoms and medical history.  A physical exam. You may also have tests, including:  A CT scan or an MRI of your brain. These imaging tests cannot diagnose migraines, but they can help to rule out other causes of headaches.  Taking fluid from the spine (lumbar puncture) and analyzing it (cerebrospinal fluid analysis, or CSF analysis).  Blood tests. How is this treated? This condition is treated with:  Medicines. These help to: ? Lessen pain and nausea. ? Prevent migraines.  Lifestyle changes, such as changes to your diet or sleeping patterns.  Behavior therapy. This may include: ? Relaxation training. ? Biofeedback. This is a treatment that teaches you to relax and use your brain to lower your heart rate and control your breathing. ? Cognitive behavioral therapy (CBT). This is a form of talk therapy. This therapy helps you set goals and follow up on the changes that you make.  Acupuncture.  Using   a device that provides electrical stimulation to your nerves, which can relieve pain (neuromodulation therapy).  Surgery, if the other treatments are not working. Follow these instructions at home: Medicines  Take over-the-counter and prescription medicines only as told by your health care provider.  Ask your health care provider if the  medicine prescribed to you requires you to avoid driving or using machinery. Lifestyle  Do not use any products that contain nicotine or tobacco, such as cigarettes, e-cigarettes, and chewing tobacco. If you need help quitting, ask your health care provider.  Do not drink alcohol.  Get 7-9 hours of sleep each night, or the amount of sleep recommended by your health care provider.  Find ways to manage stress, such as meditation, deep breathing, or yoga.  Maintain a healthy weight. If you need help losing weight, ask your health care provider.  Exercise regularly. Aim for 150 minutes of moderate-intensity exercise, such as walking, biking, or yoga, or 75 minutes of vigorous exercise each week. Vigorous exercise includes running, circuit training, and swimming.   General instructions  Keep a journal to find out what triggers your migraines so you can avoid these triggers. For example, write down: ? What you eat and drink. ? How much sleep you get. ? Any change to your diet or medicines.  Lie down in a dark, quiet room when you have a migraine.  Try placing a cool towel over your head when you have a migraine.  Keep lights dim, if bright lights bother you or make your migraines worse.  Keep all follow-up visits as told by your health care provider. This is important.   Where to find more information  Coalition for Headache and Migraine Patients (CHAMP): headachemigraine.org  American Migraine Foundation: americanmigrainefoundation.org  National Headache Foundation: headaches.org Contact a health care provider if:  Your pain does not improve, even with medicine.  Your migraines continue to return, even with medicine. Get help right away if:  Your migraine becomes severe and medicine does not help.  You have a stiff neck and fever.  You have a loss of vision.  You have muscle weakness or loss of muscle control.  You start losing your balance, or you have trouble  walking.  You feel like you may faint, or you faint.  You start having sudden and unexpected, severe headaches.  You have a seizure. Summary  Migraine headaches are usually stronger and more sudden than other headaches. Migraines are characterized by an intense pulsing, throbbing pain that is usually only present on one side of the head.  Migraines that keep coming back are called recurrent migraines. A migraine is called a chronic migraine if it happens 15 days in a month for more than 3 months.  Certain things may trigger migraines, such as lack of sleep or too much sleep, smoking, certain foods, alcohol, stress, and certain medicines.  Your treatment plan may include medicines, lifestyle changes, and behavior therapy. This information is not intended to replace advice given to you by your health care provider. Make sure you discuss any questions you have with your health care provider. Document Revised: 09/15/2019 Document Reviewed: 09/15/2019 Elsevier Patient Education  2021 Elsevier Inc.  

## 2020-10-27 ENCOUNTER — Other Ambulatory Visit: Payer: Self-pay

## 2020-10-27 ENCOUNTER — Ambulatory Visit
Admission: RE | Admit: 2020-10-27 | Discharge: 2020-10-27 | Disposition: A | Discharge status: Home / Self Care | Payer: BC Managed Care – PPO | Source: Ambulatory Visit | Attending: General Surgery | Admitting: General Surgery

## 2020-10-27 DIAGNOSIS — Z1231 Encounter for screening mammogram for malignant neoplasm of breast: Secondary | ICD-10-CM

## 2020-11-17 ENCOUNTER — Ambulatory Visit: Payer: BC Managed Care – PPO | Admitting: Family Medicine

## 2020-11-17 ENCOUNTER — Encounter: Payer: Self-pay | Admitting: Family Medicine

## 2020-11-17 ENCOUNTER — Other Ambulatory Visit: Payer: Self-pay

## 2020-11-17 VITALS — BP 101/70 | HR 67 | Temp 98.5°F | Ht 68.0 in | Wt 228.0 lb

## 2020-11-17 DIAGNOSIS — G43009 Migraine without aura, not intractable, without status migrainosus: Secondary | ICD-10-CM | POA: Diagnosis not present

## 2020-11-17 DIAGNOSIS — I10 Essential (primary) hypertension: Secondary | ICD-10-CM

## 2020-11-17 DIAGNOSIS — N951 Menopausal and female climacteric states: Secondary | ICD-10-CM

## 2020-11-17 DIAGNOSIS — Z8249 Family history of ischemic heart disease and other diseases of the circulatory system: Secondary | ICD-10-CM

## 2020-11-17 DIAGNOSIS — F419 Anxiety disorder, unspecified: Secondary | ICD-10-CM

## 2020-11-17 DIAGNOSIS — F32A Depression, unspecified: Secondary | ICD-10-CM

## 2020-11-17 MED ORDER — ROSUVASTATIN CALCIUM 10 MG PO TABS: 10.0000 mg | ORAL_TABLET | Freq: Every day | ORAL | 3 refills | Status: DC

## 2020-11-17 MED ORDER — HYDROXYZINE HCL 10 MG PO TABS: 10.0000 mg | ORAL_TABLET | Freq: Two times a day (BID) | ORAL | 5 refills | Status: DC | PRN

## 2020-11-17 MED ORDER — AMITRIPTYLINE HCL 10 MG PO TABS: 10.0000 mg | ORAL_TABLET | Freq: Every day | ORAL | 1 refills | Status: DC

## 2020-11-17 MED ORDER — HYDROCHLOROTHIAZIDE 25 MG PO TABS: 25.0000 mg | ORAL_TABLET | Freq: Every day | ORAL | 1 refills | Status: DC

## 2020-11-17 MED ORDER — VENLAFAXINE HCL ER 75 MG PO CP24: 75.0000 mg | ORAL_CAPSULE | Freq: Every day | ORAL | 1 refills | Status: DC

## 2020-11-17 NOTE — Patient Instructions (Signed)
    Next appt mid oct for physical.  Refilled meds today.   Consider:   - decreasing Topamax by half- can cause fatigue/fog like symptoms.  - if you decide to do that, then increase amitriptyline 1-2 weeks prior to 2 tabs, then try decreased Topamax.

## 2020-11-17 NOTE — Progress Notes (Signed)
This visit occurred during the SARS-CoV-2 public health emergency.  Safety protocols were in place, including screening questions prior to the visit, additional usage of staff PPE, and extensive cleaning of exam room while observing appropriate contact time as indicated for disinfecting solutions.    [name] , 11-24-1970, 50 y.o., female MRN: [id number] Patient Care Team    Relationship Specialty Notifications Start End  Ma Hillock, DO PCP - General Family Medicine  11/03/17   [name], MD PCP - Cardiology Cardiology  09/24/19   [employer]    11/03/17   Everlene Farrier, MD Consulting Physician Obstetrics and Gynecology  11/03/17   Jerrell Belfast, MD Consulting Physician Otolaryngology  11/03/17   Alda Berthold, DO Consulting Physician Neurology  11/03/17   Netta Cedars, MD Consulting Physician Orthopedic Surgery  06/01/19   Fanny Skates, MD Consulting Physician General Surgery  06/01/19     Chief Complaint  Patient presents with  . Follow-up    CMC; pt is fasting     Subjective: Daleena Rotter is a 50 y.o. female present for [facility] Anxiety and depression/Hot flashes due to menopause Pt reports she is doing well on effexor 75 mg QD- no complaints.    Hypertension/Fhx heart disease Pt reports compliance with HCTZ. Patient denies chest pain, shortness of breath, dizziness or lower extremity edema.  Pt does  take  daily baby ASA. Pt is  prescribed statin. She and her family have started a mediterranean diet ant home. RF: HTN, obesity, FH heart disease and MI at 15 in father.  Migraine:   Feeling well. Migraine free since starting amitriptyline 10 mg qhs. She did not taper up. She feels it helps her sleep, may cause a little drowsiness first in the morning. She is complaint with topamax, but does endorse mental Fogginess.   Prior note: Pt presents for an OV to follow up on migraine headache and prevention.  Patient was seen 1 week ago with a  rather significant migraine.  She is provided with a migraine cocktail of Benadryl 25, Zofran 4 and Toradol.  She states her headache resolved over the following 24 hours after being seen.  She feels like her headaches are coming more frequently approximately once a week.  Bad headaches such as like when she was seen last week are approximately both 3-4-year.  She states that she is even late to taking her Topamax her headaches will start.  She does not feel she gets the warning signs of a migraine like she has in the past.  She feels that it comes on rather quickly now.  She also endorses the time for recovery after her migraines seems to be longer.  She is able to catch the migraine early enough Maxalt is helpful. She states her headaches have greatly improved on topamax. She reports compliance with Topamax 50 mg. She has not needed to take the maxalt since starting. The noise in her ear/head has resolved. Prior note:  Pt presents for an OV with complaints of migraine for 3 days.  Patient reports she is having another migraine.  Last migraine December 03/2019.  She reports this migraine is similar with hearing a ringing in her head, light sensitivity, nausea present.  She did take some leftover Zofran on Saturday which was very helpful.  However on Sunday the nausea remained despite Zofran use.  She did not take the Vistaril.  Patient has not had a history of more chronic migraines until  her December migraine.  She reports having watery eyes prior to onset of this migraine.  She is receiving steroid injections for her chronic musculoskeletal/joint pain.  She does endorse complete resolution of all symptoms between her migraines from December until now.  She does report decreasing her red meat production over the last few months.  She reports her mother has a history of vertigo.  Her sister had vertigo with a "ear tumor.  "  Depression screen [facility] 2/9 10/12/2020 05/30/2020 04/05/2020 12/17/2019 06/01/2019  Decreased  Interest 0 1 1 0 0  Down, Depressed, Hopeless 0 1 1 1  0  PHQ - 2 Score 0 2 2 1  0  Altered sleeping 1 0 1 1 -  Tired, decreased energy 1 1 3 3  -  Change in appetite 0 0 1 0 -  Feeling bad or failure about yourself  0 2 3 1  -  Trouble concentrating 1 0 1 0 -  Moving slowly or fidgety/restless 3 1 3  0 -  Suicidal thoughts 0 0 0 0 -  PHQ-9 Score 6 6 14 6  -  Difficult doing work/chores - - Somewhat difficult Not difficult at all -    Allergies  Allergen Reactions  . Chlorhexidine Other (See Comments)    Red and purple burns and peeled skin. Cannot use surgical glue.  Pt states Betadine is safe and without reaction.  Marland Kitchen Neomycin     UNSPECIFIED REACTION   . Other Other (See Comments)    Body glue, turns skin blue  . Sulfamethoxazole-Trimethoprim     UNSPECIFIED REACTION   . Sulfasalazine Other (See Comments)  . Sulfonamide Derivatives     UNSPECIFIED REACTION   . Augmentin [Amoxicillin-Pot Clavulanate] Nausea And Vomiting    amox ok.   . Hydrocodone Nausea And Vomiting    Severe vomiting  . Oxycodone Nausea And Vomiting    Severe vomiting  . Prednisone Other (See Comments)    insomnia   Social History   Social History Narrative   Regular exercise: walking 1 mile per day   Caffeine use: soda drinker   Lives with husband and 2 children in a one story home.     Works for [employer].  She works in Science writer   Education: associates degree.     Past Medical History:  Diagnosis Date  . Abnormality of right breast on screening mammogram 10/09/2018  . Allergy   . Atypical chest pain 06/07/2019  . Colon polyps 2011   3 polpys removed  . Complication of anesthesia    "severe Reaction" "Physically ill"   . Deviated septum   . Family history of adverse reaction to anesthesia    violent when waking up from anesthesia - very sick  . Hypertension   . Hypoglycemic syndrome 1996   during pregnancy  . Hypothyroidism   . Impingement syndrome of  right shoulder region 06/04/2018  . Lymphedema of right arm   . Melanoma ([place])    03/10/2018  . PONV (postoperative nausea and vomiting)   . Shoulder injury    right - side happened from last surgery  . Thyroid disease   . Varicose vein of leg    Past Surgical History:  Procedure Laterality Date  . ABDOMINAL HYSTERECTOMY    . BREAST LUMPECTOMY WITH RADIOACTIVE SEED LOCALIZATION Right 10/09/2018   Procedure: RIGHT BREAST LUMPECTOMY WITH RADIOACTIVE SEED LOCALIZATION ERAS PATHWAY;  Surgeon: Fanny Skates, MD;  Location: [facility];  Service:  General;  Laterality: Right;  . COLONOSCOPY     polyps  . COLONOSCOPY    . LAPAROSCOPIC TOTAL HYSTERECTOMY  09/04/2017   Dr. Gaetano Net for endometriosis  . MELANOMA EXCISION WITH SENTINEL LYMPH NODE BIOPSY Left 04/02/2018   Procedure: INJECT BLUE DYE MELANOMA LEFT BACK WITH  WIDE LOCAL EXCISION MELANOMA LEFT BACK BILATERAL AXILLARY DEEP SENTINEL LYMPH NODE BIOPSY;  Surgeon: Fanny Skates, MD;  Location: [place];  Service: General;  Laterality: Left;  . TUBAL LIGATION    . WISDOM TOOTH EXTRACTION     Family History  Problem Relation Age of Onset  . Arthritis Mother   . Dementia Mother 6  . Stroke Mother   . Alcohol abuse Father   . Arthritis Father   . Hyperlipidemia Father   . Stroke Father        2012  . Depression Father   . Diabetes Father   . Heart disease Father 35  . Heart failure Father   . Heart attack Father   . Diabetes Sister   . Dementia Maternal Grandmother   . Heart disease Maternal Grandfather 42  . CAD Maternal Grandfather   . Diabetes Paternal Grandmother   . COPD Paternal Grandfather        emphysema  . Cancer Paternal Uncle        breast cancer  . Colon cancer Paternal Uncle   . CAD Maternal Uncle   . Colon cancer Maternal Uncle   . CAD Paternal Aunt   . Congestive Heart Failure Paternal Aunt   . Kidney failure Paternal Aunt   . Cancer Maternal Uncle   . Esophageal cancer  Neg Hx   . Stomach cancer Neg Hx   . Rectal cancer Neg Hx    Allergies as of 11/17/2020      Reactions   Chlorhexidine Other (See Comments)   Red and purple burns and peeled skin. Cannot use surgical glue.  Pt states Betadine is safe and without reaction.   Neomycin    UNSPECIFIED REACTION    Other Other (See Comments)   Body glue, turns skin blue   Sulfamethoxazole-trimethoprim    UNSPECIFIED REACTION    Sulfasalazine Other (See Comments)   Sulfonamide Derivatives    UNSPECIFIED REACTION    Augmentin [amoxicillin-pot Clavulanate] Nausea And Vomiting   amox ok.    Hydrocodone Nausea And Vomiting   Severe vomiting   Oxycodone Nausea And Vomiting   Severe vomiting   Prednisone Other (See Comments)   insomnia      Medication List       Accurate as of November 17, 2020 10:23 AM. If you have any questions, ask your nurse or doctor.        amitriptyline 10 MG tablet Commonly known as: ELAVIL Take 1-3 tablets (10-30 mg total) by mouth at bedtime.   aspirin EC 81 MG tablet Take 1 tablet (81 mg total) by mouth daily.   hydrochlorothiazide 25 MG tablet Commonly known as: HYDRODIURIL Take 1 tablet (25 mg total) by mouth daily.   hydrOXYzine 10 MG tablet Commonly known as: ATARAX/VISTARIL Take 1-3 tablets (10-30 mg total) by mouth 2 (two) times daily as needed.   Omega-3 1000 MG Caps Take 2,000 mg by mouth.   ondansetron 4 MG tablet Commonly known as: ZOFRAN Take 1-2 tablets (4-8 mg total) by mouth every 8 (eight) hours as needed for nausea or vomiting.   OVER THE COUNTER MEDICATION   promethazine 25 MG suppository Commonly known as:  Phenergan Place 1 suppository (25 mg total) rectally every 6 (six) hours as needed for nausea or vomiting.   rizatriptan 5 MG tablet Commonly known as: Maxalt Take 1 tablet (5 mg total) by mouth as needed for migraine. May repeat in 2 hours if needed   rosuvastatin 10 MG tablet Commonly known as: CRESTOR Take 1 tablet (10 mg total) by  mouth daily.   topiramate 50 MG tablet Commonly known as: TOPAMAX Take 1 tablet (50 mg total) by mouth daily.   venlafaxine XR 75 MG 24 hr capsule Commonly known as: EFFEXOR-XR Take 1 capsule (75 mg total) by mouth daily.       All past medical history, surgical history, allergies, family history, immunizations andmedications were updated in the EMR today and reviewed under the history and medication portions of their EMR.     ROS: Negative, with the exception of above mentioned in HPI   Objective:  BP 101/70   Pulse 67   Temp 98.5 F (36.9 C) (Oral)   Ht 5\' 8"  (1.727 m)   Wt 228 lb (103.4 kg)   LMP 08/23/2017   SpO2 97%   BMI 34.67 kg/m  Body mass index is 34.67 kg/m. Gen: Afebrile. No acute distress. Nontoxic. Pleasant obese female.  HENT: AT. [place].  Eyes:Pupils Equal Round Reactive to light, Extraocular movements intact,  Conjunctiva without redness, discharge or icterus. Neck/lymp/endocrine: Supple,no lymphadenopathy, no thyromegaly CV: RRR no murmur,  No edema Chest: CTAB, no wheeze or crackles Skin: no rashes, purpura or petechiae.  Neuro:  Normal gait. PERLA. EOMi. Alert. Oriented x3 Psych: Normal affect, dress and demeanor. Normal speech. Normal thought content and judgment.  No exam data present No results found. No results found for this or any previous visit (from the past 24 hour(s)).  Assessment/Plan: Alizia Greif is a 50 y.o. female present for OV for  Migraine: Improved with the addition of amitriptyline Continue amitriptyline 10 mg nightly.    She is complaining of mental fogginess that is more likely coming from the Topamax, therefore encouraged her if she wanted to try to decrease her Topamax dose she could increase her amitriptyline to 20 mg nightly and then a week later decrease her Topamax dose by half Continue Maxalt as needed Continue Vistaril as needed Continue Topamax (see above) Continue Zofran 4 mg every 8 hours as needed Continue  Phenergan suppositories every 8 hours as needed if unable to tolerate p.o. only.  During migraine headaches  Anxiety and depression/Hot flashes due to menopause stable Continue  effexor 75  Hypertension/Fhx heart disease:  Stable.  Continue  hctz 25 Low-sodium diet, routine exercise.     Reviewed expectations re: course of current medical issues.  Discussed self-management of symptoms.  Outlined signs and symptoms indicating need for more acute intervention.  Patient verbalized understanding and all questions were answered.  Patient received an After-Visit Summary.  f/u 5.5 mos for CPE/CMC  No orders of the defined types were placed in this encounter.  Referral Orders  No referral(s) requested today   No orders of the defined types were placed in this encounter.    Note is dictated utilizing voice recognition software. Although note has been proof read prior to signing, occasional typographical errors still can be missed. If any questions arise, please do not hesitate to call for verification.   electronically signed by:  Howard Pouch, DO  [place]

## 2020-12-27 ENCOUNTER — Telehealth: Payer: Self-pay

## 2020-12-27 NOTE — Telephone Encounter (Signed)
Patient had 5 ticks on her over the past weekend.  3 places are giving her some concern. 2 on the back of knee and 1 in crease of thigh & private area is red and whelping  Please advise.  Appt available tomorrow? [phone]

## 2020-12-27 NOTE — Telephone Encounter (Signed)
Per provider no avail work ins for pt. Pt can be sched in next avail.

## 2020-12-29 ENCOUNTER — Ambulatory Visit: Payer: BC Managed Care – PPO | Admitting: Family Medicine

## 2020-12-29 ENCOUNTER — Encounter: Payer: Self-pay | Admitting: Family Medicine

## 2020-12-29 ENCOUNTER — Other Ambulatory Visit: Payer: Self-pay

## 2020-12-29 VITALS — BP 127/84 | HR 61 | Temp 98.1°F | Wt 224.0 lb

## 2020-12-29 DIAGNOSIS — S70361A Insect bite (nonvenomous), right thigh, initial encounter: Secondary | ICD-10-CM

## 2020-12-29 DIAGNOSIS — W57XXXA Bitten or stung by nonvenomous insect and other nonvenomous arthropods, initial encounter: Secondary | ICD-10-CM

## 2020-12-29 MED ORDER — DOXYCYCLINE HYCLATE 100 MG PO TABS: 100.0000 mg | ORAL_TABLET | Freq: Two times a day (BID) | ORAL | 0 refills | Status: DC

## 2020-12-29 NOTE — Patient Instructions (Signed)
Tick Bite Information, Adult  Ticks are insects that can bite. Most ticks live in shrubs and grassy areas. They climb onto people and animals that go by. Then they bite. Some ticks carry germs that can make you sick. How can I prevent tick bites? Take these steps: Use insect repellent  Use an insect repellent that has 20% or higher of the ingredients DEET, picaridin, or IR3535. Follow the instructions on the label. Put it on: ? Bare skin. ? The tops of your boots. ? Your pant legs. ? The ends of your sleeves.  If you use an insect repellent that has the ingredient permethrin, follow the instructions on the label. Put it on: ? Clothing. ? Boots. ? Supplies or outdoor gear. ? Tents. When you are outside  Wear long sleeves and long pants.  Wear light-colored clothes.  Tuck your pant legs into your socks.  Stay in the middle of the trail. Do not touch the bushes.  Avoid walking through long grass.  Check for ticks on your clothes, hair, and skin often while you are outside. Before going inside your house, check your clothes, skin, head, neck, armpits, waist, groin, and joint areas. When you go indoors  Check your clothes for ticks. Dry your clothes in a dryer on high heat for 10 minutes or more. If clothes are damp, additional time may be needed.  Wash your clothes right away if they need to be washed. Use hot water.  Check your pets and outdoor gear.  Shower right away.  Check your body for ticks. Do a full body check using a mirror. What is the right way to remove a tick? Remove the tick from your skin as soon as possible. Do not remove the tick with your bare fingers.  To remove a tick that is crawling on your skin: ? Go outdoors and brush the tick off. ? Use tape or a lint roller.  To remove a tick that is biting: 1. Wash your hands. 2. If you have latex gloves, put them on. 3. Use tweezers, curved forceps, or a tick-removal tool to grasp the tick. Grasp the tick  as close to your skin and as close to the tick's head as possible. 4. Gently pull up until the tick lets go.  Try to keep the tick's head attached to its body.  Do not twist or jerk the tick.  Do not squeeze or crush the tick. Do not try to remove a tick with heat, alcohol, petroleum jelly, or fingernail polish.   What should I do after taking out a tick?  Throw away the tick. Do not crush a tick with your fingers.  Clean the bite area and your hands with soap and water, rubbing alcohol, or an iodine wash.  If an antiseptic cream or ointment is available, apply a small amount to the bite area.  Wash and disinfect any instruments that you used to remove the tick. How should I get rid of a live tick? To dispose of a live tick, use one of these methods:  Place the tick in rubbing alcohol.  Place the tick in a bag or container you can close tightly.  Wrap the tick tightly in tape.  Flush the tick down the toilet. Contact a doctor if:  You have symptoms, such as: ? A fever or chills. ? A red rash that makes a circle (bull's-eye rash) in the bite area. ? Redness and swelling where the tick bit you. ? Headache. ?  Pain in a muscle, joint, or bone. ? Being more tired than normal. ? Trouble walking or moving your legs. ? Numbness in your legs. ? Tender and swollen lymph glands.  A part of a tick breaks off and gets stuck in your skin. Get help right away if:  You cannot remove a tick.  You cannot move (have paralysis) or feel weak.  You are feeling worse or have new symptoms.  You find a tick that is biting you and filled with blood. This is important if you are in an area where diseases from ticks are common. Summary  Ticks may carry germs that can make you sick.  To prevent tick bites wear long sleeves, long pants, and light colors. Use insect repellent. Follow the instructions on the label.  If the tick is biting, do not try to remove it with heat, alcohol, petroleum  jelly, or fingernail polish.  Use tweezers, curved forceps, or a tick-removal tool to grasp the tick. Gently pull up until the tick lets go. Do not twist or jerk the tick. Do not squeeze or crush the tick.  If you have symptoms, contact a doctor. This information is not intended to replace advice given to you by your health care provider. Make sure you discuss any questions you have with your health care provider. Document Revised: 07/26/2019 Document Reviewed: 07/26/2019 Elsevier Patient Education  2021 [employer].

## 2020-12-29 NOTE — Progress Notes (Signed)
This visit occurred during the SARS-CoV-2 public health emergency.  Safety protocols were in place, including screening questions prior to the visit, additional usage of staff PPE, and extensive cleaning of exam room while observing appropriate contact time as indicated for disinfecting solutions.    Shannon Quinn , [birth year], 50 y.o., female MRN: [id number] Patient Care Team    Relationship Specialty Notifications Start End  Ma Hillock, DO PCP - General Family Medicine  11/03/17   [name], MD PCP - Cardiology Cardiology  09/24/19   [facility]    11/03/17   Everlene Farrier, MD Consulting Physician Obstetrics and Gynecology  11/03/17   Jerrell Belfast, MD Consulting Physician Otolaryngology  11/03/17   Alda Berthold, DO Consulting Physician Neurology  11/03/17   Netta Cedars, MD Consulting Physician Orthopedic Surgery  06/01/19   Fanny Skates, MD Consulting Physician General Surgery  06/01/19     Chief Complaint  Patient presents with  . Insect Bite    Pt c/o 4 tick bites on both legs that was noticed and removed over the weekend     Subjective: Pt presents for an OV with complaints of tick exposure 5-6 days ago.  She reports she is walking through a field they are clearing further home and she later found for ticks on her and they were removed.  She states there was an additional tick on her right inner thigh that she was not aware was there and she thinks was attached greater than 24 hours to 48 hours.  She denies any fevers, chills, fatigue, myalgias or arthralgias.  She denies any rash.  She denies headache.  Depression screen [facility] 2/9 12/29/2020 11/17/2020 10/12/2020 05/30/2020 04/05/2020  Decreased Interest 0 1 0 1 1  Down, Depressed, Hopeless 0 1 0 1 1  PHQ - 2 Score 0 2 0 2 2  Altered sleeping - 2 1 0 1  Tired, decreased energy - 3 1 1 3   Change in appetite - 1 0 0 1  Feeling bad or failure about yourself  - 1 0 2 3  Trouble concentrating  - 0 1 0 1  Moving slowly or fidgety/restless - 3 3 1 3   Suicidal thoughts - 0 0 0 0  PHQ-9 Score - 12 6 6 14   Difficult doing work/chores - - - - Somewhat difficult    Allergies  Allergen Reactions  . Chlorhexidine Other (See Comments)    Red and purple burns and peeled skin. Cannot use surgical glue.  Pt states Betadine is safe and without reaction.  Marland Kitchen Neomycin     UNSPECIFIED REACTION   . Other Other (See Comments)    Body glue, turns skin blue  . Sulfamethoxazole-Trimethoprim     UNSPECIFIED REACTION   . Sulfasalazine Other (See Comments)  . Sulfonamide Derivatives     UNSPECIFIED REACTION   . Augmentin [Amoxicillin-Pot Clavulanate] Nausea And Vomiting    amox ok.   . Hydrocodone Nausea And Vomiting    Severe vomiting  . Oxycodone Nausea And Vomiting    Severe vomiting  . Prednisone Other (See Comments)    insomnia   Social History   Social History Narrative   Regular exercise: walking 1 mile per day   Caffeine use: soda drinker   Lives with husband and 2 children in a one story home.     Works for [employer].  She works in Science writer   Education: associates  degree.     Past Medical History:  Diagnosis Date  . Abnormality of right breast on screening mammogram 10/09/2018  . Allergy   . Atypical chest pain 06/07/2019  . Colon polyps 2011   3 polpys removed  . Complication of anesthesia    "severe Reaction" "Physically ill"   . Deviated septum   . Family history of adverse reaction to anesthesia    violent when waking up from anesthesia - very sick  . Hypertension   . Hypoglycemic syndrome 1996   during pregnancy  . Hypothyroidism   . Impingement syndrome of right shoulder region 06/04/2018  . Lymphedema of right arm   . Melanoma ([place])    03/10/2018  . PONV (postoperative nausea and vomiting)   . Shoulder injury    right - side happened from last surgery  . Thyroid disease   . Varicose vein of leg    Past Surgical  History:  Procedure Laterality Date  . BREAST LUMPECTOMY WITH RADIOACTIVE SEED LOCALIZATION Right 10/09/2018   Procedure: RIGHT BREAST LUMPECTOMY WITH RADIOACTIVE SEED LOCALIZATION ERAS PATHWAY;  Surgeon: Fanny Skates, MD;  Location: [facility];  Service: General;  Laterality: Right;  . COLONOSCOPY     polyps  . LAPAROSCOPIC TOTAL HYSTERECTOMY  09/04/2017   Dr. Gaetano Net for endometriosis  . MELANOMA EXCISION WITH SENTINEL LYMPH NODE BIOPSY Left 04/02/2018   Procedure: INJECT BLUE DYE MELANOMA LEFT BACK WITH  WIDE LOCAL EXCISION MELANOMA LEFT BACK BILATERAL AXILLARY DEEP SENTINEL LYMPH NODE BIOPSY;  Surgeon: Fanny Skates, MD;  Location: [place];  Service: General;  Laterality: Left;  . TUBAL LIGATION    . WISDOM TOOTH EXTRACTION     Family History  Problem Relation Age of Onset  . Arthritis Mother   . Dementia Mother 14  . Stroke Mother   . Alcohol abuse Father   . Arthritis Father   . Hyperlipidemia Father   . Stroke Father        2012  . Depression Father   . Diabetes Father   . Heart disease Father 79  . Heart failure Father   . Heart attack Father   . Diabetes Sister   . Dementia Maternal Grandmother   . Heart disease Maternal Grandfather 42  . CAD Maternal Grandfather   . Diabetes Paternal Grandmother   . COPD Paternal Grandfather        emphysema  . Cancer Paternal Uncle        breast cancer  . Colon cancer Paternal Uncle   . CAD Maternal Uncle   . Colon cancer Maternal Uncle   . CAD Paternal Aunt   . Congestive Heart Failure Paternal Aunt   . Kidney failure Paternal Aunt   . Cancer Maternal Uncle   . Esophageal cancer Neg Hx   . Stomach cancer Neg Hx   . Rectal cancer Neg Hx    Allergies as of 12/29/2020      Reactions   Chlorhexidine Other (See Comments)   Red and purple burns and peeled skin. Cannot use surgical glue.  Pt states Betadine is safe and without reaction.   Neomycin    UNSPECIFIED REACTION    Other Other  (See Comments)   Body glue, turns skin blue   Sulfamethoxazole-trimethoprim    UNSPECIFIED REACTION    Sulfasalazine Other (See Comments)   Sulfonamide Derivatives    UNSPECIFIED REACTION    Augmentin [amoxicillin-pot Clavulanate] Nausea And Vomiting   amox ok.    Hydrocodone  Nausea And Vomiting   Severe vomiting   Oxycodone Nausea And Vomiting   Severe vomiting   Prednisone Other (See Comments)   insomnia      Medication List       Accurate as of Dec 29, 2020  5:59 PM. If you have any questions, ask your nurse or doctor.        amitriptyline 10 MG tablet Commonly known as: ELAVIL Take 1-2 tablets (10-20 mg total) by mouth at bedtime.   aspirin EC 81 MG tablet Take 1 tablet (81 mg total) by mouth daily.   doxycycline 100 MG tablet Commonly known as: VIBRA-TABS Take 1 tablet (100 mg total) by mouth 2 (two) times daily. Started by: Howard Pouch, DO   hydrochlorothiazide 25 MG tablet Commonly known as: HYDRODIURIL Take 1 tablet (25 mg total) by mouth daily.   hydrOXYzine 10 MG tablet Commonly known as: ATARAX/VISTARIL Take 1-3 tablets (10-30 mg total) by mouth 2 (two) times daily as needed.   Omega-3 1000 MG Caps Take 2,000 mg by mouth.   ondansetron 4 MG tablet Commonly known as: ZOFRAN Take 1-2 tablets (4-8 mg total) by mouth every 8 (eight) hours as needed for nausea or vomiting.   OVER THE COUNTER MEDICATION   promethazine 25 MG suppository Commonly known as: Phenergan Place 1 suppository (25 mg total) rectally every 6 (six) hours as needed for nausea or vomiting.   rizatriptan 5 MG tablet Commonly known as: Maxalt Take 1 tablet (5 mg total) by mouth as needed for migraine. May repeat in 2 hours if needed   rosuvastatin 10 MG tablet Commonly known as: CRESTOR Take 1 tablet (10 mg total) by mouth daily.   topiramate 50 MG tablet Commonly known as: TOPAMAX Take 1 tablet (50 mg total) by mouth daily.   venlafaxine XR 75 MG 24 hr capsule Commonly  known as: EFFEXOR-XR Take 1 capsule (75 mg total) by mouth daily.       All past medical history, surgical history, allergies, family history, immunizations andmedications were updated in the EMR today and reviewed under the history and medication portions of their EMR.     ROS: Negative, with the exception of above mentioned in HPI   Objective:  BP 127/84   Pulse 61   Temp 98.1 F (36.7 C) (Oral)   Wt 224 lb (101.6 kg)   LMP 08/23/2017   SpO2 99%   BMI 34.06 kg/m  Body mass index is 34.06 kg/m. Gen: Afebrile. No acute distress. Nontoxic in appearance, well developed, well nourished.  HENT: AT. [place].  Eyes:Pupils Equal Round Reactive to light, Extraocular movements intact,  Conjunctiva without redness, discharge or icterus. Neck/lymp/endocrine: Supple, no lymphadenopathy Skin: No rashes, purpura or petechiae.  Neuro: Normal gait. PERLA. EOMi. Alert. Oriented x3  Psych: Normal affect, dress and demeanor. Normal speech. Normal thought content and judgment.  No exam data present No results found. No results found for this or any previous visit (from the past 24 hour(s)).  Assessment/Plan: Odalis Jordan is a 50 y.o. female present for OV for  Tick bite, unspecified site, initial encounter Multiple tick bites over the course of the weekend.  Concern 1 tick was present greater than 48 hours.  Sites do have some redness surrounding each site, with medial right thigh having a larger red raised area. Discussed preventative measures to avoid tick exposure. Patient was given AVS information on tick exposures. Doxycycline twice daily x7 days provided Lyme titers ordered for future to be collected in  5 to 6 weeks. - B. burgdorfi antibodies; Future Patient will monitor for any symptoms of rash, headache fever etc. that would indicate tickborne illness and if he appreciates any of the above, will call in right away to be seen.  Patient is agreement with plan.   Reviewed expectations re:  course of current medical issues.  Discussed self-management of symptoms.  Outlined signs and symptoms indicating need for more acute intervention.  Patient verbalized understanding and all questions were answered.  Patient received an After-Visit Summary.    Orders Placed This Encounter  Procedures  . B. burgdorfi antibodies   Meds ordered this encounter  Medications  . doxycycline (VIBRA-TABS) 100 MG tablet    Sig: Take 1 tablet (100 mg total) by mouth 2 (two) times daily.    Dispense:  14 tablet    Refill:  0   Referral Orders  No referral(s) requested today     Note is dictated utilizing voice recognition software. Although note has been proof read prior to signing, occasional typographical errors still can be missed. If any questions arise, please do not hesitate to call for verification.   electronically signed by:  Howard Pouch, DO  [place]

## 2020-12-30 NOTE — Progress Notes (Signed)
Cardiology Office Note:    Date:  01/03/2021    ID:  Shannon Quinn, DOB June 28, 1971, MRN [id number]  PCP:  Ma Hillock, DO  Cardiologist:  [name], MD  Electrophysiologist:  None   Referring MD: Ma Hillock, DO   Chief Complaint: routine follow-up for non-obstructive CAD  History of Present Illness:    Shannon Quinn is a 50 y.o. female with a history of atypical chest pain with non-obstruction CAD on coronary CTA in 07/2019, hypertension, hyperlipidemia, hypothyroidism, migraines, melanoma, and anxiety who is followed by Dr. Oval Linsey and presents today for routine follow-up.  Patient was referred to Dr. Oval Linsey in 05/2019 at which time she reported left upper quadrant pain that would radiate to her back as well as right jaw pain that sometimes occurred at the same time as the abdominal pain. This was also associated with dizziness. Symptoms were concerning for ischemia. Therefore, coronary CTA was ordered which showed coronary calcium score of 75.7 (97th percentile for age and sex) and non-obstructive calcified plaque noted in the proximal LAD. Patient was last seen by Coletta Memos, NP, in 09/2019 at which time she was doing well from a cardiac standpoint.  Patient presents today for follow-up. Here alone. Patient has been doing well since last visit. She reports a couple of episodes of sharp pain under her right breast that will take her breath away. Last no longer than 1 minute. Always occurs a rest. [place] and [place] very active around their property. She denies any exertional chest pain. She has chronic dyspnea on exertion but this stable. She describes stable orthopnea which she has had for a long time as well as occasional PND. However, the more I talked with patient sounds like it may be more from sleep apnea than true CHF symptoms. She reports snoring and does not feel well rested in the morning. She states her husband will also wake her up sometimes when  it doesn't look like she is breathing. These sound like apneic episodes. She has mild ankle edema which is chronic relatively well controlled on HCTZ. She denies any palpitations except during a recent panic attack. She notes a little dizziness occasionally which she attributes to her Topamax but nothing significant. She does report a syncopal episode in 10/2020 that occurred in the setting of a severe migraine with associated nausea/vomting for 3 days. She denied any chest pain, shortness of breath, palpitations, lightheadedness, or dizziness prior to this. She does state she has a history of syncope - she "passed out" a lot when she was younger; however, the last syncopal episode she had before this one in March was when she was pregnant over 20 years ago. Sounds like this was due to dehydration and possible increased vagal tone. No recurrent syncopal episodes.  Past Medical History:  Diagnosis Date  . Abnormality of right breast on screening mammogram 10/09/2018  . Allergy   . Atypical chest pain 06/07/2019  . Colon polyps 2011   3 polpys removed  . Complication of anesthesia    "severe Reaction" "Physically ill"   . Deviated septum   . Family history of adverse reaction to anesthesia    violent when waking up from anesthesia - very sick  . Hypertension   . Hypoglycemic syndrome 1996   during pregnancy  . Hypothyroidism   . Impingement syndrome of right shoulder region 06/04/2018  . Lymphedema of right arm   . Melanoma ([place])    03/10/2018  .  PONV (postoperative nausea and vomiting)   . Shoulder injury    right - side happened from last surgery  . Thyroid disease   . Varicose vein of leg     Past Surgical History:  Procedure Laterality Date  . BREAST LUMPECTOMY WITH RADIOACTIVE SEED LOCALIZATION Right 10/09/2018   Procedure: RIGHT BREAST LUMPECTOMY WITH RADIOACTIVE SEED LOCALIZATION ERAS PATHWAY;  Surgeon: Fanny Skates, MD;  Location: [facility];  Service: General;   Laterality: Right;  . COLONOSCOPY     polyps  . LAPAROSCOPIC TOTAL HYSTERECTOMY  09/04/2017   Dr. Gaetano Net for endometriosis  . MELANOMA EXCISION WITH SENTINEL LYMPH NODE BIOPSY Left 04/02/2018   Procedure: INJECT BLUE DYE MELANOMA LEFT BACK WITH  WIDE LOCAL EXCISION MELANOMA LEFT BACK BILATERAL AXILLARY DEEP SENTINEL LYMPH NODE BIOPSY;  Surgeon: Fanny Skates, MD;  Location: [facility];  Service: General;  Laterality: Left;  . TUBAL LIGATION    . WISDOM TOOTH EXTRACTION      Current Medications: Current Meds  Medication Sig  . amitriptyline (ELAVIL) 10 MG tablet Take 1-2 tablets (10-20 mg total) by mouth at bedtime.  Marland Kitchen aspirin EC 81 MG tablet Take 1 tablet (81 mg total) by mouth daily.  Marland Kitchen doxycycline (VIBRA-TABS) 100 MG tablet Take 1 tablet (100 mg total) by mouth 2 (two) times daily.  . hydrochlorothiazide (HYDRODIURIL) 25 MG tablet Take 1 tablet (25 mg total) by mouth daily.  . hydrOXYzine (ATARAX/VISTARIL) 10 MG tablet Take 1-3 tablets (10-30 mg total) by mouth 2 (two) times daily as needed.  . Omega-3 1000 MG CAPS Take 2,000 mg by mouth.  . ondansetron (ZOFRAN) 4 MG tablet Take 1-2 tablets (4-8 mg total) by mouth every 8 (eight) hours as needed for nausea or vomiting.  Marland Kitchen OVER THE COUNTER MEDICATION   . promethazine (PHENERGAN) 25 MG suppository Place 1 suppository (25 mg total) rectally every 6 (six) hours as needed for nausea or vomiting.  . rizatriptan (MAXALT) 5 MG tablet Take 1 tablet (5 mg total) by mouth as needed for migraine. May repeat in 2 hours if needed  . rosuvastatin (CRESTOR) 10 MG tablet Take 1 tablet (10 mg total) by mouth daily.  Marland Kitchen topiramate (TOPAMAX) 50 MG tablet Take 1 tablet (50 mg total) by mouth daily.  Marland Kitchen venlafaxine XR (EFFEXOR-XR) 75 MG 24 hr capsule Take 1 capsule (75 mg total) by mouth daily.     Allergies:   Chlorhexidine, Neomycin, Other, Sulfamethoxazole-trimethoprim, Sulfasalazine, Sulfonamide derivatives, Augmentin [amoxicillin-pot  clavulanate], Hydrocodone, Oxycodone, and Prednisone   Social History   Socioeconomic History  . Marital status: Married    Spouse name: Not on file  . Number of children: 2  . Years of education: Not on file  . Highest education level: Not on file  Occupational History  . Not on file  Tobacco Use  . Smoking status: Never Smoker  . Smokeless tobacco: Never Used  Vaping Use  . Vaping Use: Never used  Substance and Sexual Activity  . Alcohol use: Yes    Alcohol/week: 0.0 standard drinks    Comment: rarely  . Drug use: No  . Sexual activity: Yes    Partners: Male    Birth control/protection: Surgical  Other Topics Concern  . Not on file  Social History Narrative   Regular exercise: walking 1 mile per day   Caffeine use: soda drinker   Lives with husband and 2 children in a one story home.     Works for [employer] of  [place].  She works in Science writer   Education: associates degree.     Social Determinants of Health   Financial Resource Strain: Not on file  Food Insecurity: Not on file  Transportation Needs: Not on file  Physical Activity: Not on file  Stress: Not on file  Social Connections: Not on file     Family History: The patient's family history includes Alcohol abuse in her father; Arthritis in her father and mother; CAD in her maternal grandfather, maternal uncle, and paternal aunt; COPD in her paternal grandfather; Cancer in her maternal uncle and paternal uncle; Colon cancer in her maternal uncle and paternal uncle; Congestive Heart Failure in her paternal aunt; Dementia in her maternal grandmother; Dementia (age of onset: 100) in her mother; Depression in her father; Diabetes in her father, paternal grandmother, and sister; Heart attack in her father; Heart disease (age of onset: 1) in her maternal grandfather; Heart disease (age of onset: 75) in her father; Heart failure in her father; Hyperlipidemia in her father; Kidney failure in her  paternal aunt; Stroke in her father and mother. There is no history of Esophageal cancer, Stomach cancer, or Rectal cancer.  ROS:   Please see the history of present illness.     EKGs/Labs/Other Studies Reviewed:    The following studies were reviewed today:  Coronary CTA 07/30/2019: Impressions: 1. Coronary calcium score of 75.7. This was 97th percentile for age and sex matched control. 2. Normal coronary origin with right dominance. 3. Non-obstructive, calcified plaque noted in the proximal LAD.   EKG:  EKG ordered today. EKG personally reviewed and demonstrates normal sinus rhythm, rate 67 bpm, with possible Q waves in lead III and V3-V6 with poor R wave progression. No-specific ST/T changes. QTc 443 ms.  Recent Labs: 01/03/2021: ALT 39; BUN 16; Creatinine 0.97; Hemoglobin 13.7; Platelet Count 299; Potassium 3.8; Sodium 145  Recent Lipid Panel    Component Value Date/Time   CHOL 123 01/03/2020 1151   TRIG 86 01/03/2020 1151   HDL 60 01/03/2020 1151   CHOLHDL 2.1 01/03/2020 1151   CHOLHDL 3.9 06/01/2019 1556   VLDL 20 05/25/2012 1500   LDLCALC 46 01/03/2020 1151   LDLCALC 127 (H) 06/01/2019 1556    Physical Exam:    Vital Signs: BP 116/60   Pulse 67   Ht 5' 4.5" (1.638 m)   Wt 232 lb 9.6 oz (105.5 kg)   LMP 08/23/2017   BMI 39.31 kg/m     Wt Readings from Last 3 Encounters:  01/03/21 232 lb 14.4 oz (105.6 kg)  01/03/21 232 lb 9.6 oz (105.5 kg)  12/29/20 224 lb (101.6 kg)     General: 50 y.o. obese Caucasian female in no acute distress. HEENT: Normocephalic and atraumatic. Sclera clear.  Neck: Supple. No carotid bruits. No JVD. Heart: RRR. Distinct S1 and S2. No murmurs, gallops, or rubs.  Lungs: No increased work of breathing. Clear to ausculation bilaterally. No wheezes, rhonchi, or rales.  Abdomen: Soft, non-distended, and non-tender to palpation.  Extremities: No significant lower extremity edema.    Skin: Warm and dry. Neuro: Alert and oriented x3. No  focal deficits. Psych: Normal affect. Responds appropriately.  Assessment:    1. Coronary artery disease involving native coronary artery of native heart without angina pectoris   2. Primary hypertension   3. Hyperlipidemia, unspecified hyperlipidemia type   4. Syncope, unspecified syncope type   5. Snoring     Plan:    Non-Obstructive  CAD - History of atypical chest pain. Coronary CTA in 07/2019 showed non-obstructive CAD.  - She describe a couple of episodes of very atypical chest pain at rest but nothing that sounds like angina. No exertional pain.  - Continue aspirin and statin. - No additional work-up necessary at this time.  Hypertension - BP difficult to auscultate today, especially the diastolic BP. On my personal check, I got around 116/60 (?). Patient states BP often in the 130s/70-80s at home. - Continue HCTZ 25mg  daily. - Advised patient to keep a BP/HR log for 2 weeks and then send [name] this. If BP above goal, will likely increase HCTZ given some lower extremity edema.  Hyperlipidemia - Last lipid panel in 12/2019: Total Cholesterol 123, Triglycerides 86, HDL 60, LDL 46.  - Continue Crestor 10mg  daily.  - Patient will come back for CMET and lipid panel tomorrow.  Syncope Secondary to Dehydration - Patient reports a syncopal episode in 10/2020 in the setting of a severe migraine with nausea/vomiting for 3 days. This was likely vasovagal syncope from dehydration and vomiting. However, patient also has a history of syncope. She states she used to pass out a lot when she was little. Last time she had a syncopal episode prior to the one in March was when she was pregnant over 20 years ago.  - I do think we have a clear cause for her syncopal episode in March. However, she has never had a Echo. Therefore, I will go ahead and order this. She also has some chronic dyspnea on exertion so this will help [name] have a baseline assessment of her LV function as well. I don't think we need to  order a monitor at this time. However, if she has recurrent syncope, I would consider a monitor at that time.  Of note, decision for Echo was made after visit. I tried to call patient with updated plan but had to leave a message. Will can update her on this plan when we call her with her labs results in the next couple of days.  Snoring - Patient reports snoring and not feeling well rested in the morning. It also sounds like she may be having apneic episodes.  - Suspect she has obstructive sleep apnea. Will order home sleep study.  Disposition: Follow up in 6 months with Dr. Oval Linsey.   Medication Adjustments/Labs and Tests Ordered: Current medicines are reviewed at length with the patient today.  Concerns regarding medicines are outlined above.  Orders Placed This Encounter  Procedures  . Comprehensive metabolic panel  . Lipid panel  . EKG 12-Lead  . Home sleep test   No orders of the defined types were placed in this encounter.   Patient Instructions  Medication Instructions:  Your physician recommends that you continue on your current medications as directed. Please refer to the Current Medication list given to you today.  *If you need a refill on your cardiac medications before your next appointment, please call your pharmacy*   Lab Work: Your physician recommends that you return for lab work TODAY:   CMET  Fasting Lipid Panel-DO NOT EAT OR DRINK PAST MIDNIGHT. OKAY TO HAVE WATER. If you have labs (blood work) drawn today and your tests are completely normal, you will receive your results only by: Marland Kitchen MyChart Message (if you have MyChart) OR . A paper copy in the mail If you have any lab test that is abnormal or we need to change your treatment, we will call you to  review the results.  Testing/Procedures: Your physician has recommended that you have a sleep study. This test records several body functions during sleep, including: brain activity, eye movement, oxygen and  carbon dioxide blood levels, heart rate and rhythm, breathing rate and rhythm, the flow of air through your mouth and nose, snoring, body muscle movements, and chest and belly movement.   Please schedule for 1 month    Follow-Up: At [facility], you and your health needs are our priority.  As part of our continuing mission to provide you with exceptional heart Quinn, we have created designated Provider Quinn Teams.  These Quinn Teams include your primary Cardiologist (physician) and Advanced Practice Providers (APPs -  Physician Assistants and Nurse Practitioners) who all work together to provide you with the Quinn you need, when you need it.  We recommend signing up for the patient portal called "MyChart".  Sign up information is provided on this After Visit Summary.  MyChart is used to connect with patients for Virtual Visits (Telemedicine).  Patients are able to view lab/test results, encounter notes, upcoming appointments, etc.  Non-urgent messages can be sent to your provider as well.   To learn more about what you can do with MyChart, go to [email or web address].    Your next appointment:   6 month(s)  The format for your next appointment:   In Person  Provider:   [name], MD  Other Instructions  MONITOR your blood pressure (BP) and heart rate (HR) for 2 weeks. Give our office a call with those results.      Signed, Darreld Mclean, PA-C  01/03/2021 5:14 PM    [facility]

## 2021-01-03 ENCOUNTER — Inpatient Hospital Stay: Payer: BC Managed Care – PPO | Attending: Oncology

## 2021-01-03 ENCOUNTER — Encounter: Payer: Self-pay | Admitting: Student

## 2021-01-03 ENCOUNTER — Other Ambulatory Visit: Payer: Self-pay

## 2021-01-03 ENCOUNTER — Ambulatory Visit: Payer: BC Managed Care – PPO | Admitting: Student

## 2021-01-03 ENCOUNTER — Inpatient Hospital Stay: Payer: BC Managed Care – PPO | Admitting: Oncology

## 2021-01-03 VITALS — BP 134/74 | HR 72 | Temp 97.9°F | Resp 17 | Ht 64.5 in | Wt 232.9 lb

## 2021-01-03 VITALS — BP 116/60 | HR 67 | Ht 64.5 in | Wt 232.6 lb

## 2021-01-03 DIAGNOSIS — Z85820 Personal history of malignant melanoma of skin: Secondary | ICD-10-CM | POA: Insufficient documentation

## 2021-01-03 DIAGNOSIS — E785 Hyperlipidemia, unspecified: Secondary | ICD-10-CM

## 2021-01-03 DIAGNOSIS — I10 Essential (primary) hypertension: Secondary | ICD-10-CM | POA: Diagnosis not present

## 2021-01-03 DIAGNOSIS — C439 Malignant melanoma of skin, unspecified: Secondary | ICD-10-CM

## 2021-01-03 DIAGNOSIS — I2510 Atherosclerotic heart disease of native coronary artery without angina pectoris: Secondary | ICD-10-CM

## 2021-01-03 DIAGNOSIS — R0683 Snoring: Secondary | ICD-10-CM

## 2021-01-03 DIAGNOSIS — R55 Syncope and collapse: Secondary | ICD-10-CM

## 2021-01-03 LAB — CBC WITH DIFFERENTIAL (CANCER CENTER ONLY)
Abs Immature Granulocytes: 0.01 10*3/uL (ref 0.00–0.07)
Basophils Absolute: 0.1 10*3/uL (ref 0.0–0.1)
Basophils Relative: 1 %
Eosinophils Absolute: 0.1 10*3/uL (ref 0.0–0.5)
Eosinophils Relative: 1 %
HCT: 40.6 % (ref 36.0–46.0)
Hemoglobin: 13.7 g/dL (ref 12.0–15.0)
Immature Granulocytes: 0 %
Lymphocytes Relative: 32 %
Lymphs Abs: 2.6 10*3/uL (ref 0.7–4.0)
MCH: 29.8 pg (ref 26.0–34.0)
MCHC: 33.7 g/dL (ref 30.0–36.0)
MCV: 88.3 fL (ref 80.0–100.0)
Monocytes Absolute: 0.5 10*3/uL (ref 0.1–1.0)
Monocytes Relative: 6 %
Neutro Abs: 4.9 10*3/uL (ref 1.7–7.7)
Neutrophils Relative %: 60 %
Platelet Count: 299 10*3/uL (ref 150–400)
RBC: 4.6 MIL/uL (ref 3.87–5.11)
RDW: 13.6 % (ref 11.5–15.5)
WBC Count: 8.1 10*3/uL (ref 4.0–10.5)
nRBC: 0 % (ref 0.0–0.2)

## 2021-01-03 LAB — CMP (CANCER CENTER ONLY)
ALT: 39 U/L (ref 0–44)
AST: 25 U/L (ref 15–41)
Albumin: 3.9 g/dL (ref 3.5–5.0)
Alkaline Phosphatase: 83 U/L (ref 38–126)
Anion gap: 10 (ref 5–15)
BUN: 16 mg/dL (ref 6–20)
CO2: 24 mmol/L (ref 22–32)
Calcium: 9.4 mg/dL (ref 8.9–10.3)
Chloride: 111 mmol/L (ref 98–111)
Creatinine: 0.97 mg/dL (ref 0.44–1.00)
GFR, Estimated: >60 mL/min (ref >60)
Glucose, Bld: 85 mg/dL (ref 70–99)
Potassium: 3.8 mmol/L (ref 3.5–5.1)
Sodium: 145 mmol/L (ref 135–145)
Total Bilirubin: 0.3 mg/dL (ref 0.3–1.2)
Total Protein: 7.2 g/dL (ref 6.5–8.1)

## 2021-01-03 NOTE — Patient Instructions (Signed)
Medication Instructions:  Your physician recommends that you continue on your current medications as directed. Please refer to the Current Medication list given to you today.  *If you need a refill on your cardiac medications before your next appointment, please call your pharmacy*   Lab Work: Your physician recommends that you return for lab work TODAY:   CMET  Fasting Lipid Panel-DO NOT EAT OR DRINK PAST MIDNIGHT. OKAY TO HAVE WATER. If you have labs (blood work) drawn today and your tests are completely normal, you will receive your results only by: Marland Kitchen MyChart Message (if you have MyChart) OR . A paper copy in the mail If you have any lab test that is abnormal or we need to change your treatment, we will call you to review the results.  Testing/Procedures: Your physician has recommended that you have a sleep study. This test records several body functions during sleep, including: brain activity, eye movement, oxygen and carbon dioxide blood levels, heart rate and rhythm, breathing rate and rhythm, the flow of air through your mouth and nose, snoring, body muscle movements, and chest and belly movement.   Please schedule for 1 month    Follow-Up: At [facility], you and your health needs are our priority.  As part of our continuing mission to provide you with exceptional heart care, we have created designated Provider Care Teams.  These Care Teams include your primary Cardiologist (physician) and Advanced Practice Providers (APPs -  Physician Assistants and Nurse Practitioners) who all work together to provide you with the care you need, when you need it.  We recommend signing up for the patient portal called "MyChart".  Sign up information is provided on this After Visit Summary.  MyChart is used to connect with patients for Virtual Visits (Telemedicine).  Patients are able to view lab/test results, encounter notes, upcoming appointments, etc.  Non-urgent messages can be sent to your  provider as well.   To learn more about what you can do with MyChart, go to [email or web address].    Your next appointment:   6 month(s)  The format for your next appointment:   In Person  Provider:   [name], MD  Other Instructions  MONITOR your blood pressure (BP) and heart rate (HR) for 2 weeks. Give our office a call with those results.

## 2021-01-03 NOTE — Progress Notes (Signed)
Hematology and Oncology Follow Up Visit  Shannon Quinn [id number] [birth year] 50 y.o. 01/03/2021 1:46 PM Ma Hillock, DOKuneff, [name], DO   Principle Diagnosis: 50 year old woman with cutaneous melanoma of the upper back diagnosed in 2019.  She was found to have T2AN1 with 1 out of 10 lymph node involved with 0.1 mm involvement.   Prior Therapy: She underwent wide excision with bilateral sentinel lymph node biopsy of the axilla.  The final pathology obtained on April 02, 2018 showed a residual melanoma of 0.8 mm T staging was T2a.  She had bilateral lymph node sentinel lymph node biopsy with 1 out of 10 lymph nodes were positive for melanoma.  Less than 0.1 mm tiny focus of melanoma seen on 1 of those 10 lymph nodes.   Current therapy: Active surveillance.  Interim History: Shannon Quinn is here for repeat evaluation.  Since her last visit, he reports no major changes in her health.  She denies any recent hospitalization or illnesses.  She denies any skin rashes or lesions.  She remains active and continues to attend to activities of daily living.  She denies any constitutional symptoms or weight loss.            Medications: Unchanged on review. Current Outpatient Medications  Medication Sig Dispense Refill  . amitriptyline (ELAVIL) 10 MG tablet Take 1-2 tablets (10-20 mg total) by mouth at bedtime. 180 tablet 1  . aspirin EC 81 MG tablet Take 1 tablet (81 mg total) by mouth daily. 90 tablet 3  . doxycycline (VIBRA-TABS) 100 MG tablet Take 1 tablet (100 mg total) by mouth 2 (two) times daily. 14 tablet 0  . hydrochlorothiazide (HYDRODIURIL) 25 MG tablet Take 1 tablet (25 mg total) by mouth daily. 90 tablet 1  . hydrOXYzine (ATARAX/VISTARIL) 10 MG tablet Take 1-3 tablets (10-30 mg total) by mouth 2 (two) times daily as needed. 90 tablet 5  . Omega-3 1000 MG CAPS Take 2,000 mg by mouth.    . ondansetron (ZOFRAN) 4 MG tablet Take 1-2 tablets (4-8 mg total) by mouth every 8 (eight)  hours as needed for nausea or vomiting. 40 tablet 5  . OVER THE COUNTER MEDICATION     . promethazine (PHENERGAN) 25 MG suppository Place 1 suppository (25 mg total) rectally every 6 (six) hours as needed for nausea or vomiting. 12 each 0  . rizatriptan (MAXALT) 5 MG tablet Take 1 tablet (5 mg total) by mouth as needed for migraine. May repeat in 2 hours if needed 10 tablet 5  . rosuvastatin (CRESTOR) 10 MG tablet Take 1 tablet (10 mg total) by mouth daily. 90 tablet 3  . topiramate (TOPAMAX) 50 MG tablet Take 1 tablet (50 mg total) by mouth daily. 90 tablet 1  . venlafaxine XR (EFFEXOR-XR) 75 MG 24 hr capsule Take 1 capsule (75 mg total) by mouth daily. 90 capsule 1   No current facility-administered medications for this visit.     Allergies:  Allergies  Allergen Reactions  . Chlorhexidine Other (See Comments)    Red and purple burns and peeled skin. Cannot use surgical glue.  Pt states Betadine is safe and without reaction.  Marland Kitchen Neomycin     UNSPECIFIED REACTION   . Other Other (See Comments)    Body glue, turns skin blue  . Sulfamethoxazole-Trimethoprim     UNSPECIFIED REACTION   . Sulfasalazine Other (See Comments)  . Sulfonamide Derivatives     UNSPECIFIED REACTION   . Augmentin [Amoxicillin-Pot Clavulanate] Nausea And  Vomiting    amox ok.   . Hydrocodone Nausea And Vomiting    Severe vomiting  . Oxycodone Nausea And Vomiting    Severe vomiting  . Prednisone Other (See Comments)    insomnia        Physical Exam:   Blood pressure 134/74, pulse 72, temperature 97.9 F (36.6 C), temperature source Tympanic, resp. rate 17, height 5' 4.5" (1.638 m), weight 232 lb 14.4 oz (105.6 kg), last menstrual period 08/23/2017, SpO2 98 %.    ECOG: 0   General appearance: Alert, awake without any distress. Head: Atraumatic without abnormalities Oropharynx: Without any thrush or ulcers. Eyes: No scleral icterus. Lymph nodes: No lymphadenopathy noted in the cervical,  supraclavicular, or axillary nodes Heart:regular rate and rhythm, without any murmurs or gallops.   Lung: Clear to auscultation without any rhonchi, wheezes or dullness to percussion. Abdomin: Soft, nontender without any shifting dullness or ascites. Musculoskeletal: No clubbing or cyanosis. Neurological: No motor or sensory deficits. Skin: No rashes or lesions.        Lab Results: Lab Results  Component Value Date   WBC 6.0 06/28/2020   HGB 13.4 06/28/2020   HCT 41.9 06/28/2020   MCV 91.5 06/28/2020   PLT 273 06/28/2020     Chemistry      Component Value Date/Time   NA 143 06/28/2020 1001   NA 142 01/03/2020 1151   K 4.5 06/28/2020 1001   CL 110 06/28/2020 1001   CO2 24 06/28/2020 1001   BUN 14 06/28/2020 1001   BUN 10 01/03/2020 1151   CREATININE 0.96 06/28/2020 1001   CREATININE 0.88 06/01/2019 1556   GLU 87 08/30/2017 0000      Component Value Date/Time   CALCIUM 9.5 06/28/2020 1001   ALKPHOS 85 06/28/2020 1001   AST 26 06/28/2020 1001   ALT 35 06/28/2020 1001   BILITOT 0.3 06/28/2020 1001        Impression and Plan:  50 year old woman with:  1. T2a N1  melanoma of the upper back in 2019.  She is status post surgical resection without any evidence of relapse since that time.   Disease status was updated at this time and treatment options were reviewed.  She continues to be without active surveillance without any evidence of relapse.  Imaging studies obtained in November 2022 were reiterated and showed no evidence of recurrence.  Risk of relapse was assessed at this time and treatment options if she develops relapsed were reviewed.  Immunotherapy remains the first option at this time if she develops relapsed disease.  The plan is to update her staging scans before the next visit.  She is agreeable to proceed at this time.   2.  Dermatology surveillance: I recommended continuing this indefinitely.  She has no recent issues reported.  3.  Age-appropriate  cancer screening: Mammogram obtained in March 2022 did not show any abnormalities and otherwise she is up-to-date.  Her colonoscopy was in the last few years and she is up-to-date.  4.  Follow-up: She will return in 6 months for repeat follow-up and updating her staging scans.  30  minutes were dedicated to this visit.  The time was spent on reviewing laboratory data, disease status update and outlining future plan of care.   Zola Button, MD 5/25/20221:46 PM

## 2021-01-04 DIAGNOSIS — I10 Essential (primary) hypertension: Secondary | ICD-10-CM | POA: Diagnosis not present

## 2021-01-04 DIAGNOSIS — E785 Hyperlipidemia, unspecified: Secondary | ICD-10-CM | POA: Diagnosis not present

## 2021-01-04 LAB — LIPID PANEL
Chol/HDL Ratio: 2.5 ratio (ref 0.0–4.4)
Cholesterol, Total: 140 mg/dL (ref 100–199)
HDL: 57 mg/dL (ref >39)
LDL Chol Calc (NIH): 70 mg/dL (ref 0–99)
Triglycerides: 61 mg/dL (ref 0–149)
VLDL Cholesterol Cal: 13 mg/dL (ref 5–40)

## 2021-01-04 LAB — COMPREHENSIVE METABOLIC PANEL
ALT: 39 IU/L — ABNORMAL HIGH (ref 0–32)
AST: 31 IU/L (ref 0–40)
Albumin/Globulin Ratio: 1.9 (ref 1.2–2.2)
Albumin: 4.4 g/dL (ref 3.8–4.9)
Alkaline Phosphatase: 92 IU/L (ref 44–121)
BUN/Creatinine Ratio: 16 (ref 9–23)
BUN: 14 mg/dL (ref 6–24)
Bilirubin Total: 0.4 mg/dL (ref 0.0–1.2)
CO2: 20 mmol/L (ref 20–29)
Calcium: 9.3 mg/dL (ref 8.7–10.2)
Chloride: 105 mmol/L (ref 96–106)
Creatinine, Ser: 0.9 mg/dL (ref 0.57–1.00)
Globulin, Total: 2.3 g/dL (ref 1.5–4.5)
Glucose: 97 mg/dL (ref 65–99)
Potassium: 4.3 mmol/L (ref 3.5–5.2)
Sodium: 139 mmol/L (ref 134–144)
Total Protein: 6.7 g/dL (ref 6.0–8.5)
eGFR: 77 mL/min/{1.73_m2} (ref >59)

## 2021-01-05 ENCOUNTER — Telehealth: Payer: Self-pay | Admitting: *Deleted

## 2021-01-05 NOTE — Telephone Encounter (Signed)
-----   Message from Jacqulynn Cadet, [place] sent at 01/03/2021 10:38 AM EDT ----- Regarding: home sleep study Good morning   Sande Rives, PA-C saw this patient and ordered a home sleep study.  Dx: Snoring   Thanks  Ellison Carwin

## 2021-01-05 NOTE — Telephone Encounter (Signed)
Left HST appointment details on VM. Per Synetta Fail with [employer] no PA is required.for HST. Call reference # [id number].

## 2021-01-12 ENCOUNTER — Other Ambulatory Visit: Payer: Self-pay

## 2021-01-12 ENCOUNTER — Telehealth: Payer: Self-pay | Admitting: Cardiovascular Disease

## 2021-01-12 DIAGNOSIS — R0609 Other forms of dyspnea: Secondary | ICD-10-CM

## 2021-01-12 DIAGNOSIS — R55 Syncope and collapse: Secondary | ICD-10-CM

## 2021-01-12 DIAGNOSIS — R110 Nausea: Secondary | ICD-10-CM

## 2021-01-12 DIAGNOSIS — R0600 Dyspnea, unspecified: Secondary | ICD-10-CM

## 2021-01-12 NOTE — Telephone Encounter (Signed)
Patient was returning call 

## 2021-01-12 NOTE — Telephone Encounter (Signed)
Called patient, advised of lab work.  Patient would like lab work sent to PCP for them to follow her elevated liver lab.   I did explain the need for the ECHO patient understood and had no question.  Will make PA and covering nurse aware.  Thanks!

## 2021-01-23 ENCOUNTER — Other Ambulatory Visit: Payer: Self-pay

## 2021-01-23 ENCOUNTER — Ambulatory Visit (INDEPENDENT_AMBULATORY_CARE_PROVIDER_SITE_OTHER): Payer: BC Managed Care – PPO

## 2021-01-23 DIAGNOSIS — L905 Scar conditions and fibrosis of skin: Secondary | ICD-10-CM | POA: Diagnosis not present

## 2021-01-23 DIAGNOSIS — Z85828 Personal history of other malignant neoplasm of skin: Secondary | ICD-10-CM | POA: Diagnosis not present

## 2021-01-23 DIAGNOSIS — Z85820 Personal history of malignant melanoma of skin: Secondary | ICD-10-CM | POA: Diagnosis not present

## 2021-01-23 DIAGNOSIS — S70361A Insect bite (nonvenomous), right thigh, initial encounter: Secondary | ICD-10-CM

## 2021-01-23 DIAGNOSIS — W57XXXA Bitten or stung by nonvenomous insect and other nonvenomous arthropods, initial encounter: Secondary | ICD-10-CM | POA: Diagnosis not present

## 2021-01-23 DIAGNOSIS — D2361 Other benign neoplasm of skin of right upper limb, including shoulder: Secondary | ICD-10-CM | POA: Diagnosis not present

## 2021-01-23 DIAGNOSIS — L821 Other seborrheic keratosis: Secondary | ICD-10-CM | POA: Diagnosis not present

## 2021-01-24 LAB — B. BURGDORFI ANTIBODIES: B burgdorferi Ab IgG+IgM: <0.9 index

## 2021-02-09 ENCOUNTER — Other Ambulatory Visit (HOSPITAL_COMMUNITY): Payer: BC Managed Care – PPO

## 2021-02-16 ENCOUNTER — Other Ambulatory Visit: Payer: Self-pay

## 2021-02-16 ENCOUNTER — Ambulatory Visit (HOSPITAL_BASED_OUTPATIENT_CLINIC_OR_DEPARTMENT_OTHER): Payer: BC Managed Care – PPO | Attending: Student | Admitting: Cardiovascular Disease

## 2021-02-16 DIAGNOSIS — R0683 Snoring: Secondary | ICD-10-CM | POA: Diagnosis not present

## 2021-02-16 DIAGNOSIS — G4733 Obstructive sleep apnea (adult) (pediatric): Secondary | ICD-10-CM | POA: Insufficient documentation

## 2021-02-16 DIAGNOSIS — R0902 Hypoxemia: Secondary | ICD-10-CM | POA: Insufficient documentation

## 2021-02-23 ENCOUNTER — Other Ambulatory Visit: Payer: Self-pay

## 2021-02-23 ENCOUNTER — Ambulatory Visit (HOSPITAL_COMMUNITY): Payer: BC Managed Care – PPO | Attending: Cardiovascular Disease

## 2021-02-23 DIAGNOSIS — R110 Nausea: Secondary | ICD-10-CM | POA: Diagnosis not present

## 2021-02-23 DIAGNOSIS — R0609 Other forms of dyspnea: Secondary | ICD-10-CM

## 2021-02-23 DIAGNOSIS — R55 Syncope and collapse: Secondary | ICD-10-CM | POA: Insufficient documentation

## 2021-02-23 DIAGNOSIS — R0600 Dyspnea, unspecified: Secondary | ICD-10-CM | POA: Insufficient documentation

## 2021-02-23 LAB — ECHOCARDIOGRAM COMPLETE
Area-P 1/2: 3.11 cm2
S' Lateral: 3.2 cm

## 2021-03-04 ENCOUNTER — Encounter (HOSPITAL_BASED_OUTPATIENT_CLINIC_OR_DEPARTMENT_OTHER): Payer: Self-pay | Admitting: Cardiovascular Disease

## 2021-03-04 NOTE — Procedures (Signed)
     Patient Name: Shannon Quinn, Shannon Quinn Date: 02/18/2021 Gender: Female D.O.B: 1971-01-19 Age (years): 49 Referring Provider: Sande Rives PA-C Height (inches): 65 Interpreting Physician: Shelva Majestic MD, ABSM Weight (lbs): 230 RPSGT: Jacolyn Reedy BMI: 39 MRN: [id number] Neck Size: 15.50  CLINICAL INFORMATION Sleep Study Type: HST  Indication for sleep study: snoring, non-restorative sleep  Epworth Sleepiness Score: 8  SLEEP STUDY TECHNIQUE A multi-channel overnight portable sleep study was performed. The channels recorded were: nasal airflow, thoracic respiratory movement, and oxygen saturation with a pulse oximetry. Snoring was also monitored.  MEDICATIONS amitriptyline (ELAVIL) 10 MG tablet aspirin EC 81 MG tablet doxycycline (VIBRA-TABS) 100 MG tablet hydrochlorothiazide (HYDRODIURIL) 25 MG tablet hydrOXYzine (ATARAX/VISTARIL) 10 MG tablet Omega-3 1000 MG CAPS ondansetron (ZOFRAN) 4 MG tablet OVER THE COUNTER MEDICATION promethazine (PHENERGAN) 25 MG suppository rizatriptan (MAXALT) 5 MG tablet rosuvastatin (CRESTOR) 10 MG tablet topiramate (TOPAMAX) 50 MG tablet venlafaxine XR (EFFEXOR-XR) 75 MG 24 hr capsule Patient self administered medications include: N/A.  SLEEP ARCHITECTURE Patient was studied for 363.2 minutes. The sleep efficiency was 100.0 % and the patient was supine for 0%. The arousal index was 0.0 per hour.  RESPIRATORY PARAMETERS The overall AHI was 20.3 per hour, with a central apnea index of 0 per hour.  The oxygen nadir was 75% during sleep.  CARDIAC DATA Mean heart rate during sleep was 62.9 bpm.  IMPRESSIONS - Moderate obstructive sleep apnea occurred during this study (AHI 20.3/h). The severity during REM sleep cannot be assessed on this home study. - Severe oxygen desaturation to a nadir of 75%. - Patient snored 1.6% during the sleep.  DIAGNOSIS - Obstructive Sleep Apnea (G47.33) - Nocturnal Hypoxemia  (G47.36)  RECOMMENDATIONS - Recommend a therapeutic CPAP titration study to further evaluate and treat her sleep disorded breathing. If unable to obtain a titration study, initiate Auto-PAP with EPR of 3 at 6 - 16 cm of water. - Effort should be made to optimize nasal and oropharyngeal patency. - Avoid alcohol, sedatives and other CNS depressants that may worsen sleep apnea and disrupt normal sleep architecture. - Sleep hygiene should be reviewed to assess factors that may improve sleep quality. - Weight management (BMI 39) and regular exercise should be initiated or continued. - Recommend a download and sleep clinic evaluation after one month of therapy.    [Electronically signed] 03/04/2021 01:03 PM  Shelva Majestic MD, [facility], [place], American Board of Sleep Medicine   NPI: [id number] [place] PH: [phone]   FX: [phone] [place]

## 2021-03-14 ENCOUNTER — Telehealth: Payer: Self-pay | Admitting: *Deleted

## 2021-03-14 NOTE — Telephone Encounter (Signed)
-----   Message from Troy Sine, MD sent at 03/04/2021  1:09 PM EDT ----- Mariann Laster, please notify pt; try for CPAP titration study, if unable, then initiate Auto-PAP

## 2021-03-14 NOTE — Telephone Encounter (Signed)
Patient notified of sleep study results and recommendations. She has no questions and agrees to proceed with having CPAP titration.

## 2021-03-23 ENCOUNTER — Other Ambulatory Visit: Payer: Self-pay | Admitting: Cardiovascular Disease

## 2021-03-23 DIAGNOSIS — G4736 Sleep related hypoventilation in conditions classified elsewhere: Secondary | ICD-10-CM

## 2021-03-23 DIAGNOSIS — G4733 Obstructive sleep apnea (adult) (pediatric): Secondary | ICD-10-CM

## 2021-03-23 NOTE — Telephone Encounter (Signed)
Patient notified of CPAP titration appointment details.

## 2021-05-13 ENCOUNTER — Ambulatory Visit (HOSPITAL_BASED_OUTPATIENT_CLINIC_OR_DEPARTMENT_OTHER): Payer: BC Managed Care – PPO | Attending: Cardiovascular Disease | Admitting: Cardiovascular Disease

## 2021-05-13 ENCOUNTER — Other Ambulatory Visit: Payer: Self-pay

## 2021-05-13 DIAGNOSIS — G4736 Sleep related hypoventilation in conditions classified elsewhere: Secondary | ICD-10-CM

## 2021-05-13 DIAGNOSIS — Z7982 Long term (current) use of aspirin: Secondary | ICD-10-CM | POA: Diagnosis not present

## 2021-05-13 DIAGNOSIS — G4733 Obstructive sleep apnea (adult) (pediatric): Secondary | ICD-10-CM | POA: Diagnosis not present

## 2021-05-13 DIAGNOSIS — Z79899 Other long term (current) drug therapy: Secondary | ICD-10-CM | POA: Diagnosis not present

## 2021-05-18 ENCOUNTER — Other Ambulatory Visit: Payer: Self-pay

## 2021-05-18 MED ORDER — AMITRIPTYLINE HCL 10 MG PO TABS: 10.0000 mg | ORAL_TABLET | Freq: Every day | ORAL | 0 refills | Status: DC

## 2021-05-23 ENCOUNTER — Ambulatory Visit (INDEPENDENT_AMBULATORY_CARE_PROVIDER_SITE_OTHER): Payer: BC Managed Care – PPO | Admitting: Family Medicine

## 2021-05-23 ENCOUNTER — Encounter: Payer: Self-pay | Admitting: Family Medicine

## 2021-05-23 ENCOUNTER — Other Ambulatory Visit: Payer: Self-pay

## 2021-05-23 VITALS — BP 139/79 | HR 70 | Temp 98.3°F | Ht 64.96 in | Wt 238.8 lb

## 2021-05-23 DIAGNOSIS — I10 Essential (primary) hypertension: Secondary | ICD-10-CM | POA: Diagnosis not present

## 2021-05-23 DIAGNOSIS — Z1159 Encounter for screening for other viral diseases: Secondary | ICD-10-CM

## 2021-05-23 DIAGNOSIS — E034 Atrophy of thyroid (acquired): Secondary | ICD-10-CM | POA: Diagnosis not present

## 2021-05-23 DIAGNOSIS — Z23 Encounter for immunization: Secondary | ICD-10-CM | POA: Diagnosis not present

## 2021-05-23 DIAGNOSIS — F32A Depression, unspecified: Secondary | ICD-10-CM

## 2021-05-23 DIAGNOSIS — Z131 Encounter for screening for diabetes mellitus: Secondary | ICD-10-CM

## 2021-05-23 DIAGNOSIS — I2510 Atherosclerotic heart disease of native coronary artery without angina pectoris: Secondary | ICD-10-CM

## 2021-05-23 DIAGNOSIS — C439 Malignant melanoma of skin, unspecified: Secondary | ICD-10-CM | POA: Diagnosis not present

## 2021-05-23 DIAGNOSIS — F419 Anxiety disorder, unspecified: Secondary | ICD-10-CM

## 2021-05-23 DIAGNOSIS — Z1231 Encounter for screening mammogram for malignant neoplasm of breast: Secondary | ICD-10-CM

## 2021-05-23 DIAGNOSIS — G43809 Other migraine, not intractable, without status migrainosus: Secondary | ICD-10-CM

## 2021-05-23 DIAGNOSIS — Z8249 Family history of ischemic heart disease and other diseases of the circulatory system: Secondary | ICD-10-CM

## 2021-05-23 DIAGNOSIS — Z0000 Encounter for general adult medical examination without abnormal findings: Secondary | ICD-10-CM | POA: Diagnosis not present

## 2021-05-23 DIAGNOSIS — N951 Menopausal and female climacteric states: Secondary | ICD-10-CM

## 2021-05-23 LAB — LIPID PANEL
Cholesterol: 163 mg/dL (ref 0–200)
HDL: 48.5 mg/dL (ref >39.00)
LDL Cholesterol: 88 mg/dL (ref 0–99)
NonHDL: 114.33
Total CHOL/HDL Ratio: 3
Triglycerides: 134 mg/dL (ref 0.0–149.0)
VLDL: 26.8 mg/dL (ref 0.0–40.0)

## 2021-05-23 LAB — CBC WITH DIFFERENTIAL/PLATELET
Basophils Absolute: 0.1 10*3/uL (ref 0.0–0.1)
Basophils Relative: 0.9 % (ref 0.0–3.0)
Eosinophils Absolute: 0.1 10*3/uL (ref 0.0–0.7)
Eosinophils Relative: 2.1 % (ref 0.0–5.0)
HCT: 40.9 % (ref 36.0–46.0)
Hemoglobin: 13.4 g/dL (ref 12.0–15.0)
Lymphocytes Relative: 27.8 % (ref 12.0–46.0)
Lymphs Abs: 1.7 10*3/uL (ref 0.7–4.0)
MCHC: 32.6 g/dL (ref 30.0–36.0)
MCV: 89.7 fl (ref 78.0–100.0)
Monocytes Absolute: 0.4 10*3/uL (ref 0.1–1.0)
Monocytes Relative: 6.2 % (ref 3.0–12.0)
Neutro Abs: 3.8 10*3/uL (ref 1.4–7.7)
Neutrophils Relative %: 63 % (ref 43.0–77.0)
Platelets: 241 10*3/uL (ref 150.0–400.0)
RBC: 4.56 Mil/uL (ref 3.87–5.11)
RDW: 14.6 % (ref 11.5–15.5)
WBC: 6.1 10*3/uL (ref 4.0–10.5)

## 2021-05-23 LAB — COMPREHENSIVE METABOLIC PANEL
ALT: 55 U/L — ABNORMAL HIGH (ref 0–35)
AST: 37 U/L (ref 0–37)
Albumin: 4.1 g/dL (ref 3.5–5.2)
Alkaline Phosphatase: 82 U/L (ref 39–117)
BUN: 13 mg/dL (ref 6–23)
CO2: 25 mEq/L (ref 19–32)
Calcium: 9.5 mg/dL (ref 8.4–10.5)
Chloride: 108 mEq/L (ref 96–112)
Creatinine, Ser: 0.85 mg/dL (ref 0.40–1.20)
GFR: 78.9 mL/min (ref >60.00)
Glucose, Bld: 103 mg/dL — ABNORMAL HIGH (ref 70–99)
Potassium: 4.1 mEq/L (ref 3.5–5.1)
Sodium: 140 mEq/L (ref 135–145)
Total Bilirubin: 0.4 mg/dL (ref 0.2–1.2)
Total Protein: 6.8 g/dL (ref 6.0–8.3)

## 2021-05-23 LAB — HEMOGLOBIN A1C: Hgb A1c MFr Bld: 5.7 % (ref 4.6–6.5)

## 2021-05-23 LAB — TSH: TSH: 2.33 u[IU]/mL (ref 0.35–5.50)

## 2021-05-23 LAB — T4, FREE: Free T4: 0.63 ng/dL (ref 0.60–1.60)

## 2021-05-23 MED ORDER — ONDANSETRON HCL 4 MG PO TABS: 4.0000 mg | ORAL_TABLET | Freq: Three times a day (TID) | ORAL | 5 refills | Status: DC | PRN

## 2021-05-23 MED ORDER — VENLAFAXINE HCL ER 75 MG PO CP24: 75.0000 mg | ORAL_CAPSULE | Freq: Every day | ORAL | 1 refills | Status: DC

## 2021-05-23 MED ORDER — HYDROXYZINE HCL 10 MG PO TABS: 10.0000 mg | ORAL_TABLET | Freq: Two times a day (BID) | ORAL | 5 refills | Status: DC | PRN

## 2021-05-23 MED ORDER — TOPIRAMATE 50 MG PO TABS: 50.0000 mg | ORAL_TABLET | Freq: Every day | ORAL | 1 refills | Status: DC

## 2021-05-23 MED ORDER — RIZATRIPTAN BENZOATE 5 MG PO TABS: 5.0000 mg | ORAL_TABLET | ORAL | 5 refills | Status: DC | PRN

## 2021-05-23 MED ORDER — AMITRIPTYLINE HCL 10 MG PO TABS: 10.0000 mg | ORAL_TABLET | Freq: Every day | ORAL | 1 refills | Status: DC

## 2021-05-23 MED ORDER — HYDROCHLOROTHIAZIDE 25 MG PO TABS: 25.0000 mg | ORAL_TABLET | Freq: Every day | ORAL | 1 refills | Status: DC
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
## 2021-05-23 NOTE — Patient Instructions (Signed)
Great to see you today.  I have refilled the medication(s) we provide.   If labs were collected, we will inform you of lab results once received either by echart message or telephone call.   - echart message- for normal results that have been seen by the patient already.   - telephone call: abnormal results or if patient has not viewed results in their echart. Health Maintenance, Female Adopting a healthy lifestyle and getting preventive care are important in promoting health and wellness. Ask your health care provider about: The right schedule for you to have regular tests and exams. Things you can do on your own to prevent diseases and keep yourself healthy. What should I know about diet, weight, and exercise? Eat a healthy diet  Eat a diet that includes plenty of vegetables, fruits, low-fat dairy products, and lean protein. Do not eat a lot of foods that are high in solid fats, added sugars, or sodium. Maintain a healthy weight Body mass index (BMI) is used to identify weight problems. It estimates body fat based on height and weight. Your health care provider can help determine your BMI and help you achieve or maintain a healthy weight. Get regular exercise Get regular exercise. This is one of the most important things you can do for your health. Most adults should: Exercise for at least 150 minutes each week. The exercise should increase your heart rate and make you sweat (moderate-intensity exercise). Do strengthening exercises at least twice a week. This is in addition to the moderate-intensity exercise. Spend less time sitting. Even light physical activity can be beneficial. Watch cholesterol and blood lipids Have your blood tested for lipids and cholesterol at 52 years of age, then have this test every 5 years. Have your cholesterol levels checked more often if: Your lipid or cholesterol levels are high. You are older than 52 years of age. You are at high risk for heart  disease. What should I know about cancer screening? Depending on your health history and family history, you may need to have cancer screening at various ages. This may include screening for: Breast cancer. Cervical cancer. Colorectal cancer. Skin cancer. Lung cancer. What should I know about heart disease, diabetes, and high blood pressure? Blood pressure and heart disease High blood pressure causes heart disease and increases the risk of stroke. This is more likely to develop in people who have high blood pressure readings, are of African descent, or are overweight. Have your blood pressure checked: Every 3-5 years if you are 18-39 years of age. Every year if you are 40 years old or older. Diabetes Have regular diabetes screenings. This checks your fasting blood sugar level. Have the screening done: Once every three years after age 40 if you are at a normal weight and have a low risk for diabetes. More often and at a younger age if you are overweight or have a high risk for diabetes. What should I know about preventing infection? Hepatitis B If you have a higher risk for hepatitis B, you should be screened for this virus. Talk with your health care provider to find out if you are at risk for hepatitis B infection. Hepatitis C Testing is recommended for: Everyone born from 1945 through 1965. Anyone with known risk factors for hepatitis C. Sexually transmitted infections (STIs) Get screened for STIs, including gonorrhea and chlamydia, if: You are sexually active and are younger than 52 years of age. You are older than 52 years of age and your   health care provider tells you that you are at risk for this type of infection. Your sexual activity has changed since you were last screened, and you are at increased risk for chlamydia or gonorrhea. Ask your health care provider if you are at risk. Ask your health care provider about whether you are at high risk for HIV. Your health care provider  may recommend a prescription medicine to help prevent HIV infection. If you choose to take medicine to prevent HIV, you should first get tested for HIV. You should then be tested every 3 months for as long as you are taking the medicine. Pregnancy If you are about to stop having your period (premenopausal) and you may become pregnant, seek counseling before you get pregnant. Take 400 to 800 micrograms (mcg) of folic acid every day if you become pregnant. Ask for birth control (contraception) if you want to prevent pregnancy. Osteoporosis and menopause Osteoporosis is a disease in which the bones lose minerals and strength with aging. This can result in bone fractures. If you are 65 years old or older, or if you are at risk for osteoporosis and fractures, ask your health care provider if you should: Be screened for bone loss. Take a calcium or vitamin D supplement to lower your risk of fractures. Be given hormone replacement therapy (HRT) to treat symptoms of menopause. Follow these instructions at home: Lifestyle Do not use any products that contain nicotine or tobacco, such as cigarettes, e-cigarettes, and chewing tobacco. If you need help quitting, ask your health care provider. Do not use street drugs. Do not share needles. Ask your health care provider for help if you need support or information about quitting drugs. Alcohol use Do not drink alcohol if: Your health care provider tells you not to drink. You are pregnant, may be pregnant, or are planning to become pregnant. If you drink alcohol: Limit how much you use to 0-1 drink a day. Limit intake if you are breastfeeding. Be aware of how much alcohol is in your drink. In the [place] one drink equals one 12 oz bottle of beer (355 mL), one 5 oz glass of wine (148 mL), or one 1 oz glass of hard liquor (44 mL). General instructions Schedule regular health, dental, and eye exams. Stay current with your vaccines. Tell your health care  provider if: You often feel depressed. You have ever been abused or do not feel safe at home. Summary Adopting a healthy lifestyle and getting preventive care are important in promoting health and wellness. Follow your health care provider's instructions about healthy diet, exercising, and getting tested or screened for diseases. Follow your health care provider's instructions on monitoring your cholesterol and blood pressure. This information is not intended to replace advice given to you by your health care provider. Make sure you discuss any questions you have with your health care provider. Document Revised: 10/06/2020 Document Reviewed: 07/22/2018 Elsevier Patient Education  2022 Elsevier Inc.  

## 2021-05-24 LAB — HEPATITIS C ANTIBODY
Hepatitis C Ab: NONREACTIVE
SIGNAL TO CUT-OFF: 0.3 (ref <1.00)

## 2021-05-30 ENCOUNTER — Encounter (HOSPITAL_BASED_OUTPATIENT_CLINIC_OR_DEPARTMENT_OTHER): Payer: Self-pay | Admitting: Cardiovascular Disease

## 2021-05-30 NOTE — Procedures (Signed)
Patient Name: Shannon Quinn, Shannon Quinn Date: 05/13/2021 Gender: Female D.O.B: 11/28/1970 Age (years): 50 Referring Provider: Sande Rives PA-C Height (inches): 65 Interpreting Physician: Shelva Majestic MD, ABSM Weight (lbs): 230 RPSGT: Baxter Flattery BMI: 34 MRN: [id number] Neck Size: 15.50  CLINICAL INFORMATION The patient is referred for a CPAP titration to treat sleep apnea.  Date of HST: 02/18/2021:  AHI 20.3/h; O2 nadir 75%.  SLEEP STUDY TECHNIQUE As per the AASM Manual for the Scoring of Sleep and Associated Events v2.3 (April 2016) with a hypopnea requiring 4% desaturations.  The channels recorded and monitored were frontal, central and occipital EEG, electrooculogram (EOG), submentalis EMG (chin), nasal and oral airflow, thoracic and abdominal wall motion, anterior tibialis EMG, snore microphone, electrocardiogram, and pulse oximetry. Continuous positive airway pressure (CPAP) was initiated at the beginning of the study and titrated to treat sleep-disordered breathing.  MEDICATIONS amitriptyline (ELAVIL) 10 MG tablet aspirin EC 81 MG tablet hydrochlorothiazide (HYDRODIURIL) 25 MG tablet hydrOXYzine (ATARAX/VISTARIL) 10 MG tablet Omega-3 1000 MG CAPS ondansetron (ZOFRAN) 4 MG tablet OVER THE COUNTER MEDICATION promethazine (PHENERGAN) 25 MG suppository rizatriptan (MAXALT) 5 MG tablet topiramate (TOPAMAX) 50 MG tablet venlafaxine XR (EFFEXOR-XR) 75 MG 24 hr capsule  Medications self-administered by patient taken the night of the study : N/A  TECHNICIAN COMMENTS Comments added by technician: Patient had difficulty initiating sleep. Comments added by scorer: N/A  RESPIRATORY PARAMETERS Optimal PAP Pressure (cm): 11 AHI at Optimal Pressure (/hr): 0 Overall Minimal O2 (%): 90.0 Supine % at Optimal Pressure (%): 0 Minimal O2 at Optimal Pressure (%): 90.0   SLEEP ARCHITECTURE The study was initiated at 11:15:00 PM and ended at 5:12:10 AM.  Sleep onset time was  41.6 minutes and the sleep efficiency was 84.7%%. The total sleep time was 302.5 minutes.  The patient spent 6.4%% of the night in stage N1 sleep, 93.6%% in stage N2 sleep, 0.0%% in stage N3 and 0% in REM.Stage REM latency was N/A minutes  Wake after sleep onset was 13.1. Alpha intrusion was absent. Supine sleep was 24.96%.  CARDIAC DATA The 2 lead EKG demonstrated sinus rhythm. The mean heart rate was 58.2 beats per minute. Other EKG findings include: None.  LEG MOVEMENT DATA The total Periodic Limb Movements of Sleep (PLMS) were 0. The PLMS index was 0.0. A PLMS index of <15 is considered normal in adults.  IMPRESSIONS - CPAP was initiated at 7 cm and was titrated to 11 cm of water. AHI was 0, but REM sleep was absent throughout the study. - Significant oxygen desaturations were not observed during this titration (min O2 90.0%). - The patient snored with moderate to soft snoring volume during this titration study. - No cardiac abnormalities were observed during this study. - Clinically significant periodic limb movements were not noted during this study. Arousals associated with PLMs were rare.  DIAGNOSIS - Obstructive Sleep Apnea (G47.33)  RECOMMENDATIONS - Recommend an initial trial of CPAP Auto therapy with EPR of 3 at 10 - 16 cm H2O with heated humidification.  A Small size Fisher&Paykel Full Face Mask Simplus mask was used for the titration. - Effort should be made to optimize nasal and oropharyngeal patency. - Avoid alcohol, sedatives and other CNS depressants that may worsen sleep apnea and disrupt normal sleep architecture. - Sleep hygiene should be reviewed to assess factors that may improve sleep quality. - Weight management (BMI39) and regular exercise should be initiated or continued. - Recommend a download in 30 days and sleep clinic evaluation  after 4 weeks of therapy   [Electronically signed] 05/30/2021 08:07 PM  Shelva Majestic MD, [facility], ABSM Diplomate, American Board  of Sleep Medicine   NPI: [id number]  [place] PH: [phone]   FX: [phone] [place]

## 2021-06-08 ENCOUNTER — Telehealth: Payer: Self-pay | Admitting: *Deleted

## 2021-06-08 NOTE — Telephone Encounter (Signed)
Left message that her CPAP titration has been completed and order for her machine has been sent to [facility].

## 2021-06-08 NOTE — Telephone Encounter (Signed)
-----   Message from Troy Sine, MD sent at 05/30/2021  8:17 PM EDT ----- Mariann Laster please notify pt and set up CPAP with DME

## 2021-07-10 ENCOUNTER — Ambulatory Visit (HOSPITAL_BASED_OUTPATIENT_CLINIC_OR_DEPARTMENT_OTHER): Payer: BC Managed Care – PPO | Admitting: Cardiovascular Disease

## 2021-07-10 ENCOUNTER — Other Ambulatory Visit: Payer: Self-pay

## 2021-07-10 ENCOUNTER — Encounter (HOSPITAL_BASED_OUTPATIENT_CLINIC_OR_DEPARTMENT_OTHER): Payer: Self-pay | Admitting: Cardiovascular Disease

## 2021-07-10 VITALS — BP 118/87 | HR 68 | Wt 239.0 lb

## 2021-07-10 DIAGNOSIS — I10 Essential (primary) hypertension: Secondary | ICD-10-CM | POA: Diagnosis not present

## 2021-07-10 DIAGNOSIS — I739 Peripheral vascular disease, unspecified: Secondary | ICD-10-CM

## 2021-07-10 DIAGNOSIS — E785 Hyperlipidemia, unspecified: Secondary | ICD-10-CM | POA: Diagnosis not present

## 2021-07-10 DIAGNOSIS — I2510 Atherosclerotic heart disease of native coronary artery without angina pectoris: Secondary | ICD-10-CM

## 2021-07-10 DIAGNOSIS — R600 Localized edema: Secondary | ICD-10-CM

## 2021-07-10 HISTORY — DX: Peripheral vascular disease, unspecified: I73.9
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
## 2021-07-10 NOTE — Assessment & Plan Note (Signed)
She has varicose veins and LE edema that seems to be due to venous insufficiency.  Her heart is structurally normal and has no diastolic dysfunction.

## 2021-07-10 NOTE — Assessment & Plan Note (Signed)
It is difficult to determine if her leg pain is coming from her back or claudication.  DP/TP pulses are diminished.  We will check ABI/Doppler.  If normal, suggest she follow up with orthopedics.

## 2021-07-10 NOTE — Assessment & Plan Note (Signed)
Non-obstrucitve disease.  She has no ischemic symptoms.  LDL goal <70.  She is going to work on diet and exercise.  Check fasting lipids/CMP in 6 months.  If LDL is >70 she will start a statin.

## 2021-07-10 NOTE — Patient Instructions (Addendum)
Medication Instructions:  Your physician recommends that you continue on your current medications as directed. Please refer to the Current Medication list given to you today.   *If you need a refill on your cardiac medications before your next appointment, please call your pharmacy*   Lab Work: FASTING LP/CMET IN 6 MONTHS  If you have labs (blood work) drawn today and your tests are completely normal, you will receive your results only by: [place] (if you have MyChart) OR A paper copy in the mail If you have any lab test that is abnormal or we need to change your treatment, we will call you to review the results.   Testing/Procedures: Your physician has requested that you have a lower or upper extremity arterial duplex. This test is an ultrasound of the arteries in the legs or arms. It looks at arterial blood flow in the legs and arms. Allow one hour for Lower and Upper Arterial scans. There are no restrictions or special instructions  Your physician has requested that you have an ankle brachial index (ABI). During this test an ultrasound and blood pressure cuff are used to evaluate the arteries that supply the arms and legs with blood. Allow thirty minutes for this exam. There are no restrictions or special instructions.  Follow-Up: At [facility], you and your health needs are our priority.  As part of our continuing mission to provide you with exceptional heart care, we have created designated Provider Care Teams.  These Care Teams include your primary Cardiologist (physician) and Advanced Practice Providers (APPs -  Physician Assistants and Nurse Practitioners) who all work together to provide you with the care you need, when you need it.  We recommend signing up for the patient portal called "MyChart".  Sign up information is provided on this After Visit Summary.  MyChart is used to connect with patients for Virtual Visits (Telemedicine).  Patients are able to view lab/test  results, encounter notes, upcoming appointments, etc.  Non-urgent messages can be sent to your provider as well.   To learn more about what you can do with MyChart, go to [email or web address].    Your next appointment:   6 month(s)  The format for your next appointment:   In Person  Provider:   [name], MD   You have been referred to [place]  IF YOU DO NOT HEAR FROM THEM IN 2 WEEKS CALL THEM DIRECTLY AT HIGHLIGHTED NUMBER   Other Instructions WORK HARDER ON DIET AND EXERCISE

## 2021-07-10 NOTE — Assessment & Plan Note (Signed)
Blood pressure well-controlled.  Continue HCTZ.  Increase exercise to at least 150 minutes weekly.

## 2021-07-12 ENCOUNTER — Inpatient Hospital Stay: Payer: BC Managed Care – PPO | Attending: Oncology

## 2021-07-12 ENCOUNTER — Ambulatory Visit (HOSPITAL_COMMUNITY)
Admission: RE | Admit: 2021-07-12 | Discharge: 2021-07-12 | Disposition: A | Discharge status: Home / Self Care | Payer: BC Managed Care – PPO | Source: Ambulatory Visit | Attending: Oncology | Admitting: Oncology

## 2021-07-12 ENCOUNTER — Other Ambulatory Visit: Payer: Self-pay

## 2021-07-12 DIAGNOSIS — C4359 Malignant melanoma of other part of trunk: Secondary | ICD-10-CM | POA: Diagnosis not present

## 2021-07-12 DIAGNOSIS — Z85820 Personal history of malignant melanoma of skin: Secondary | ICD-10-CM | POA: Insufficient documentation

## 2021-07-12 DIAGNOSIS — C439 Malignant melanoma of skin, unspecified: Secondary | ICD-10-CM | POA: Diagnosis not present

## 2021-07-12 LAB — CMP (CANCER CENTER ONLY)
ALT: 68 U/L — ABNORMAL HIGH (ref 0–44)
AST: 43 U/L — ABNORMAL HIGH (ref 15–41)
Albumin: 3.9 g/dL (ref 3.5–5.0)
Alkaline Phosphatase: 92 U/L (ref 38–126)
Anion gap: 9 (ref 5–15)
BUN: 14 mg/dL (ref 6–20)
CO2: 20 mmol/L — ABNORMAL LOW (ref 22–32)
Calcium: 8.8 mg/dL — ABNORMAL LOW (ref 8.9–10.3)
Chloride: 111 mmol/L (ref 98–111)
Creatinine: 1.02 mg/dL — ABNORMAL HIGH (ref 0.44–1.00)
GFR, Estimated: >60 mL/min (ref >60)
Glucose, Bld: 103 mg/dL — ABNORMAL HIGH (ref 70–99)
Potassium: 4 mmol/L (ref 3.5–5.1)
Sodium: 140 mmol/L (ref 135–145)
Total Bilirubin: 0.4 mg/dL (ref 0.3–1.2)
Total Protein: 7.5 g/dL (ref 6.5–8.1)

## 2021-07-12 LAB — CBC WITH DIFFERENTIAL (CANCER CENTER ONLY)
Abs Immature Granulocytes: 0.02 10*3/uL (ref 0.00–0.07)
Basophils Absolute: 0.1 10*3/uL (ref 0.0–0.1)
Basophils Relative: 1 %
Eosinophils Absolute: 0.2 10*3/uL (ref 0.0–0.5)
Eosinophils Relative: 2 %
HCT: 42.5 % (ref 36.0–46.0)
Hemoglobin: 13.9 g/dL (ref 12.0–15.0)
Immature Granulocytes: 0 %
Lymphocytes Relative: 28 %
Lymphs Abs: 2 10*3/uL (ref 0.7–4.0)
MCH: 29.3 pg (ref 26.0–34.0)
MCHC: 32.7 g/dL (ref 30.0–36.0)
MCV: 89.7 fL (ref 80.0–100.0)
Monocytes Absolute: 0.4 10*3/uL (ref 0.1–1.0)
Monocytes Relative: 6 %
Neutro Abs: 4.6 10*3/uL (ref 1.7–7.7)
Neutrophils Relative %: 63 %
Platelet Count: 304 10*3/uL (ref 150–400)
RBC: 4.74 MIL/uL (ref 3.87–5.11)
RDW: 13.7 % (ref 11.5–15.5)
WBC Count: 7.3 10*3/uL (ref 4.0–10.5)
nRBC: 0 % (ref 0.0–0.2)

## 2021-07-12 LAB — GLUCOSE, CAPILLARY: Glucose-Capillary: 100 mg/dL — ABNORMAL HIGH (ref 70–99)

## 2021-07-12 MED ORDER — FLUDEOXYGLUCOSE F - 18 (FDG) INJECTION
12.0000 | Freq: Once | INTRAVENOUS | Status: AC | PRN
  Administered 2021-07-12: 12 via INTRAVENOUS

## 2021-07-19 ENCOUNTER — Other Ambulatory Visit: Payer: Self-pay

## 2021-07-19 ENCOUNTER — Inpatient Hospital Stay: Payer: BC Managed Care – PPO | Admitting: Oncology

## 2021-07-19 VITALS — BP 137/86 | HR 80 | Temp 97.9°F | Resp 17 | Ht 64.96 in | Wt 239.7 lb

## 2021-07-19 DIAGNOSIS — C439 Malignant melanoma of skin, unspecified: Secondary | ICD-10-CM | POA: Diagnosis not present

## 2021-07-19 NOTE — Progress Notes (Signed)
Hematology and Oncology Follow Up Visit  Shannon Quinn [id number] 31-Mar-1971 50 y.o. 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  She was found to have 1 out of 10 lymph node involved.   Prior Therapy: She underwent wide excision with bilateral sentinel lymph node biopsy of the axilla.  The final pathology obtained on April 02, 2018 showed Quinn residual melanoma of 0.8 mm T staging was T2a.  She had bilateral lymph node sentinel lymph node biopsy with 1 out of 10 lymph nodes were positive for melanoma.  Less than 0.1 mm tiny focus of melanoma seen on 1 of those 10 lymph nodes.   Current therapy: Active surveillance.  Interim History: Shannon Quinn is here for Quinn follow-up visit.  Since her last visit, he reports feeling well without any major complaints.  She denies any nausea, vomiting or abdominal pain.  She is experiencing circulation issues and currently undergoing vascular assessment.  She denies any skin rashes or lesions.  She denies any hospitalization or illnesses.            Medications: reviewed without changes. Current Outpatient Medications  Medication Sig Dispense Refill   amitriptyline (ELAVIL) 10 MG tablet Take 1 tablet (10 mg total) by mouth at bedtime. 90 tablet 1   aspirin EC 81 MG tablet Take 1 tablet (81 mg total) by mouth daily. 90 tablet 3   hydrochlorothiazide (HYDRODIURIL) 25 MG tablet Take 1 tablet (25 mg total) by mouth daily. 90 tablet 1   hydrOXYzine (ATARAX/VISTARIL) 10 MG tablet Take 1-3 tablets (10-30 mg total) by mouth 2 (two) times daily as needed. 90 tablet 5   Omega-3 1000 MG CAPS Take 2,000 mg by mouth.     ondansetron (ZOFRAN) 4 MG tablet Take 1-2 tablets (4-8 mg total) by mouth every 8 (eight) hours as needed for nausea or vomiting. 40 tablet 5   OVER THE COUNTER MEDICATION      promethazine (PHENERGAN) 25 MG suppository Place 1  suppository (25 mg total) rectally every 6 (six) hours as needed for nausea or vomiting. 12 each 0   rizatriptan (MAXALT) 5 MG tablet Take 1 tablet (5 mg total) by mouth as needed for migraine. May repeat in 2 hours if needed 10 tablet 5   topiramate (TOPAMAX) 50 MG tablet Take 1 tablet (50 mg total) by mouth daily. 90 tablet 1   venlafaxine XR (EFFEXOR-XR) 75 MG 24 hr capsule Take 1 capsule (75 mg total) by mouth daily. 90 capsule 1   No current facility-administered medications for this visit.     Allergies:  Allergies  Allergen Reactions   Chlorhexidine Other (See Comments)    Red and purple burns and peeled skin. Cannot use surgical glue.  Pt states Betadine is safe and without reaction.   Neomycin     UNSPECIFIED REACTION    Other Other (See Comments)    Body glue, turns skin blue   Sulfamethoxazole-Trimethoprim     UNSPECIFIED REACTION    Sulfasalazine Other (See Comments)   Sulfonamide Derivatives     UNSPECIFIED REACTION    Augmentin [Amoxicillin-Pot Clavulanate] Nausea And Vomiting    amox ok.    Hydrocodone Nausea And Vomiting    Severe vomiting   Oxycodone Nausea And Vomiting    Severe vomiting   Prednisone Other (See Comments)    insomnia        Physical Exam:  Blood pressure 137/86, pulse 80, temperature 97.9 F (36.6 C), temperature source Temporal, resp. rate 17, height 5' 4.96" (1.65 m), weight 239 lb 11.2 oz (108.7 kg), last menstrual period 08/23/2017, SpO2 100 %.      ECOG: 0    General appearance: Comfortable appearing without any discomfort Head: Normocephalic without any trauma Oropharynx: Mucous membranes are moist and pink without any thrush or ulcers. Eyes: Pupils are equal and round reactive to light. Lymph nodes: No cervical, supraclavicular, inguinal or axillary lymphadenopathy.   Heart:regular rate and rhythm.  S1 and S2 without leg edema. Lung: Clear without any rhonchi or wheezes.  No dullness to percussion. Abdomin: Soft,  nontender, nondistended with good bowel sounds.  No hepatosplenomegaly. Musculoskeletal: No joint deformity or effusion.  Full range of motion noted. Neurological: No deficits noted on motor, sensory and deep tendon reflex exam. Skin: No petechial rash or dryness.  Appeared moist.          Lab Results: Lab Results  Component Value Date   WBC 7.3 07/12/2021   HGB 13.9 07/12/2021   HCT 42.5 07/12/2021   MCV 89.7 07/12/2021   PLT 304 07/12/2021     Chemistry      Component Value Date/Time   NA 140 07/12/2021 1016   NA 139 01/04/2021 1043   K 4.0 07/12/2021 1016   CL 111 07/12/2021 1016   CO2 20 (L) 07/12/2021 1016   BUN 14 07/12/2021 1016   BUN 14 01/04/2021 1043   CREATININE 1.02 (H) 07/12/2021 1016   CREATININE 0.88 06/01/2019 1556   GLU 87 08/30/2017 0000      Component Value Date/Time   CALCIUM 8.8 (L) 07/12/2021 1016   ALKPHOS 92 07/12/2021 1016   AST 43 (H) 07/12/2021 1016   ALT 68 (H) 07/12/2021 1016   BILITOT 0.4 07/12/2021 1016       IMPRESSION: 1. Currently no findings of active malignancy. 2. 5 mm left mid ureteral calculus associated with mild left hydronephrosis and mild left hydroureter. 3. Other imaging findings of potential clinical significance: Diffuse hepatic steatosis. Left hepatic lobe cyst. Left anterior descending coronary artery atherosclerosis.      Impression and Plan:  50 year old woman with:   1.  Cutaneous melanoma of the upper back diagnosed in 2019.  She was found to have T2a N1 disease.   She is currently on active surveillance without any evidence of relapsed disease.  PET scan obtained on July 12, 2021 was personally reviewed and showed no evidence of metastasis.  Risks and benefits of continuing this approach were discussed at this time.  Systemic therapy in the form of Quinn single agent or doublet of immunotherapy were reiterated.  She is agreeable to continue at this time.   2.  Dermatology surveillance: She remains on  active surveillance which is recommended at this time.  3.  Age-appropriate cancer screening: She remains up-to-date at this time.   4.  Follow-up: In 6 months for repeat follow-up and repeat imaging studies in 1 year.   30  minutes were spent on this encounter.  The time was dedicated to reviewing laboratory data, imaging studies and future plan of care treatment choices.   Zola Button, MD 12/8/20229:19 AM

## 2021-07-23 ENCOUNTER — Ambulatory Visit (INDEPENDENT_AMBULATORY_CARE_PROVIDER_SITE_OTHER): Payer: BC Managed Care – PPO

## 2021-07-23 DIAGNOSIS — R600 Localized edema: Secondary | ICD-10-CM | POA: Diagnosis not present

## 2021-07-23 DIAGNOSIS — I739 Peripheral vascular disease, unspecified: Secondary | ICD-10-CM | POA: Diagnosis not present

## 2021-07-30 DIAGNOSIS — L821 Other seborrheic keratosis: Secondary | ICD-10-CM | POA: Diagnosis not present

## 2021-07-30 DIAGNOSIS — Z85820 Personal history of malignant melanoma of skin: Secondary | ICD-10-CM | POA: Diagnosis not present

## 2021-07-30 DIAGNOSIS — D1801 Hemangioma of skin and subcutaneous tissue: Secondary | ICD-10-CM | POA: Diagnosis not present

## 2021-07-30 DIAGNOSIS — Z85828 Personal history of other malignant neoplasm of skin: Secondary | ICD-10-CM | POA: Diagnosis not present

## 2021-07-30 DIAGNOSIS — B029 Zoster without complications: Secondary | ICD-10-CM | POA: Diagnosis not present

## 2021-07-30 DIAGNOSIS — B009 Herpesviral infection, unspecified: Secondary | ICD-10-CM | POA: Diagnosis not present

## 2021-07-30 DIAGNOSIS — L309 Dermatitis, unspecified: Secondary | ICD-10-CM | POA: Diagnosis not present

## 2021-08-08 ENCOUNTER — Telehealth (HOSPITAL_BASED_OUTPATIENT_CLINIC_OR_DEPARTMENT_OTHER): Payer: Self-pay | Admitting: *Deleted

## 2021-08-08 NOTE — Telephone Encounter (Signed)
Left message to call back  

## 2021-08-08 NOTE — Telephone Encounter (Signed)
-----   Message from [name], MD sent at 08/07/2021 11:49 AM EST ----- Normal blood flow to both legs.

## 2021-08-09 ENCOUNTER — Other Ambulatory Visit: Payer: Self-pay | Admitting: *Deleted

## 2021-08-09 DIAGNOSIS — R609 Edema, unspecified: Secondary | ICD-10-CM

## 2021-08-12 DIAGNOSIS — R300 Dysuria: Secondary | ICD-10-CM | POA: Diagnosis not present

## 2021-08-15 NOTE — Telephone Encounter (Signed)
Patient never viewed in mychart. Printed, highlighted Dr Blenda Mounts comments and mailed to patient

## 2021-08-20 DIAGNOSIS — R300 Dysuria: Secondary | ICD-10-CM | POA: Diagnosis not present

## 2021-08-22 ENCOUNTER — Ambulatory Visit (HOSPITAL_COMMUNITY)
Admission: RE | Admit: 2021-08-22 | Discharge: 2021-08-22 | Disposition: A | Discharge status: Home / Self Care | Payer: BC Managed Care – PPO | Source: Ambulatory Visit | Attending: Physician Assistant | Admitting: Physician Assistant

## 2021-08-22 ENCOUNTER — Ambulatory Visit (INDEPENDENT_AMBULATORY_CARE_PROVIDER_SITE_OTHER): Payer: BC Managed Care – PPO | Admitting: Physician Assistant

## 2021-08-22 ENCOUNTER — Other Ambulatory Visit: Payer: Self-pay

## 2021-08-22 VITALS — BP 135/85 | HR 65 | Temp 97.9°F | Resp 20 | Ht 64.8 in | Wt 242.6 lb

## 2021-08-22 DIAGNOSIS — R609 Edema, unspecified: Secondary | ICD-10-CM

## 2021-08-22 DIAGNOSIS — M7989 Other specified soft tissue disorders: Secondary | ICD-10-CM

## 2021-08-22 NOTE — Progress Notes (Signed)
VASC[facility]           [facility] [place]  History and Physical   Shannon Quinn is a 51 y.o. female who presents with bilateral leg swelling.  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  She states that she has tingling and pain in her legs.  She has worn compression in the past but she had swelling above the stocking and it was uncomfortable and she is unsure if it really helped.  She states that her swelling is improved in the mornings after awakening.  She states that she has a small blockage in heart and she is on a mild diuretic.  She does not have hx of DVT.  She does have family hx of varicose veins with her mother, GM and other women in her family.  She has not had any procedures on her veins.  She has hx of having a skin cancer removed.  She does have hx of Melanoma. She does not have skin color changes in her legs.  She did have normal ABI with normal TBI and toe pressures last month. She does not have rest pain, non healing wounds.   The pt is not on a statin for cholesterol management.  The pt is on a daily aspirin.   Other AC:  none The pt is on diuretic for hypertension.   The pt is not diabetic.   Tobacco hx:  never  Pt uncle does have hx of AAA with her uncle.  He was a smoker.    Past Medical History:  Diagnosis Date   Abnormality of right breast on screening mammogram 10/09/2018   Allergy    Atypical chest pain 06/07/2019   Claudication in peripheral vascular disease ([place]) 07/10/2021   Colon polyps 2011   3 polpys removed   Complication of anesthesia    "severe Reaction" "Physically ill"    Deviated septum    Family history of adverse reaction to anesthesia    violent when waking up from anesthesia - very sick   Hypertension    Hypoglycemic syndrome 1996   during pregnancy   Hypothyroidism    Impingement syndrome of right shoulder region 06/04/2018    Lymphedema of right arm    Melanoma ([place])    03/10/2018   PONV (postoperative nausea and vomiting)    Shoulder injury    right - side happened from last surgery   Thyroid disease    Varicose vein of leg     Past Surgical History:  Procedure Laterality Date   BREAST LUMPECTOMY WITH RADIOACTIVE SEED LOCALIZATION Right 10/09/2018   Procedure: RIGHT BREAST LUMPECTOMY [place];  Surgeon: Fanny Skates, MD;  Location: [facility];  Service: General;  Laterality: Right;   COLONOSCOPY     polyps   LAPAROSCOPIC TOTAL HYSTERECTOMY  09/04/2017   Dr. Gaetano Net for endometriosis   MELANOMA EXCISION WITH SENTINEL LYMPH NODE BIOPSY Left 04/02/2018   Procedure: INJECT BLUE DYE MELANOMA LEFT BACK WITH  WIDE LOCAL EXCISION MELANOMA LEFT BACK BILATERAL AXILLARY DEEP SENTINEL LYMPH NODE BIOPSY;  Surgeon: Fanny Skates, MD;  Location: [facility];  Service: General;  Laterality: Left;   TUBAL LIGATION     WISDOM TOOTH EXTRACTION      Social History   Socioeconomic History  Marital status: Married    Spouse name: Not on file   Number of children: 2   Years of education: Not on file   Highest education level: Not on file  Occupational History   Not on file  Tobacco Use   Smoking status: Never   Smokeless tobacco: Never  Vaping Use   Vaping Use: Never used  Substance and Sexual Activity   Alcohol use: Yes    Alcohol/week: 0.0 standard drinks    Comment: rarely   Drug use: No   Sexual activity: Yes    Partners: Male    Birth control/protection: Surgical  Other Topics Concern   Not on file  Social History Narrative   Regular exercise: walking 1 mile per day   Caffeine use: soda drinker   Lives with husband and 2 children in a one story home.     Works for [employer].  She works in Science writer   Education: associates degree.     Social Determinants of Health   Financial Resource  Strain: Not on file  Food Insecurity: Not on file  Transportation Needs: Not on file  Physical Activity: Not on file  Stress: Not on file  Social Connections: Not on file  Intimate Partner Violence: Not on file     Family History  Problem Relation Age of Onset   Arthritis Mother    Dementia Mother 25   Stroke Mother    Alcohol abuse Father    Arthritis Father    Hyperlipidemia Father    Stroke Father        2012   Depression Father    Diabetes Father    Heart disease Father 5   Heart failure Father    Heart attack Father    Diabetes Sister    Dementia Maternal Grandmother    Heart disease Maternal Grandfather 64   CAD Maternal Grandfather    Diabetes Paternal Grandmother    COPD Paternal Grandfather        emphysema   Cancer Paternal Uncle        breast cancer   Colon cancer Paternal Uncle    CAD Maternal Uncle    Colon cancer Maternal Uncle    CAD Paternal Aunt    Congestive Heart Failure Paternal Aunt    Kidney failure Paternal Aunt    Cancer Maternal Uncle    Esophageal cancer Neg Hx    Stomach cancer Neg Hx    Rectal cancer Neg Hx     Current Outpatient Medications  Medication Sig Dispense Refill   amitriptyline (ELAVIL) 10 MG tablet Take 1 tablet (10 mg total) by mouth at bedtime. 90 tablet 1   aspirin EC 81 MG tablet Take 1 tablet (81 mg total) by mouth daily. 90 tablet 3   hydrochlorothiazide (HYDRODIURIL) 25 MG tablet Take 1 tablet (25 mg total) by mouth daily. 90 tablet 1   hydrOXYzine (ATARAX/VISTARIL) 10 MG tablet Take 1-3 tablets (10-30 mg total) by mouth 2 (two) times daily as needed. 90 tablet 5   Omega-3 1000 MG CAPS Take 2,000 mg by mouth.     ondansetron (ZOFRAN) 4 MG tablet Take 1-2 tablets (4-8 mg total) by mouth every 8 (eight) hours as needed for nausea or vomiting. 40 tablet 5   OVER THE COUNTER MEDICATION      promethazine (PHENERGAN) 25 MG suppository Place 1 suppository (25 mg total) rectally every 6 (six) hours as needed for nausea or  vomiting.  12 each 0   rizatriptan (MAXALT) 5 MG tablet Take 1 tablet (5 mg total) by mouth as needed for migraine. May repeat in 2 hours if needed 10 tablet 5   topiramate (TOPAMAX) 50 MG tablet Take 1 tablet (50 mg total) by mouth daily. 90 tablet 1   venlafaxine XR (EFFEXOR-XR) 75 MG 24 hr capsule Take 1 capsule (75 mg total) by mouth daily. 90 capsule 1   No current facility-administered medications for this visit.    Allergies  Allergen Reactions   Chlorhexidine Other (See Comments)    Red and purple burns and peeled skin. Cannot use surgical glue.  Pt states Betadine is safe and without reaction.   Neomycin     UNSPECIFIED REACTION    Other Other (See Comments)    Body glue, turns skin blue   Sulfamethoxazole-Trimethoprim     UNSPECIFIED REACTION    Sulfasalazine Other (See Comments)   Sulfonamide Derivatives     UNSPECIFIED REACTION    Augmentin [Amoxicillin-Pot Clavulanate] Nausea And Vomiting    amox ok.    Hydrocodone Nausea And Vomiting    Severe vomiting   Oxycodone Nausea And Vomiting    Severe vomiting   Prednisone Other (See Comments)    insomnia    REVIEW OF SYSTEMS:   [X]  denotes positive finding, [ ]  denotes negative finding Cardiac  Comments:  Chest pain or chest pressure:    Shortness of breath upon exertion: x   Short of breath when lying flat:    Irregular heart rhythm:        Vascular    Pain in calf, thigh, or hip brought on by ambulation: x See HPI  Pain in feet at night that wakes you up from your sleep:     Blood clot in your veins:    Leg swelling:  x       Pulmonary    Oxygen at home:    Productive cough:     Wheezing:         Neurologic    Sudden weakness in arms or legs:     Sudden numbness in arms or legs:     Sudden onset of difficulty speaking or slurred speech:    Temporary loss of vision in one eye:     Problems with dizziness:         Gastrointestinal    Blood in stool:     Vomited blood:         Genitourinary     Burning when urinating:     Blood in urine:        Psychiatric    Major depression:         Hematologic    Bleeding problems:    Problems with blood clotting too easily:        Skin    Rashes or ulcers:        Constitutional    Fever or chills:      PHYSICAL EXAMINATION:  Today's Vitals   08/22/21 1503  BP: 135/85  Pulse: 65  Resp: 20  Temp: 97.9 F (36.6 C)  TempSrc: Temporal  SpO2: 95%  Weight: 242 lb 9.6 oz (110 kg)  Height: 5' 4.8" (1.646 m)   Body mass index is 40.62 kg/m.   General:  WDWN in NAD; vital signs documented above Gait: Not observed HENT: WNL, normocephalic Pulmonary: normal non-labored breathing without wheezing Cardiac: regular HR; without carotid bruits Abdomen: soft, NT, no masses; aortic pulse is not  palpable Skin: without rashes Vascular Exam/Pulses:  Right Left  Radial 2+ (normal) 2+ (normal)  DP 2+ (normal) 2+ (normal)  PT Unable to palpate 1+ (weak)   Extremities: trace ankle edema; no non healing wounds present Neurologic: A&O X 3;  moving all extremities equally Psychiatric:  The pt has Normal affect.   Non-Invasive Vascular Imaging:   Venous duplex on 08/22/2021: Venous Reflux Times  +--------------+---------+------+-----------+------------+--------+   RIGHT          Reflux No Reflux Reflux Time Diameter cms Comments                              Yes                                       +--------------+---------+------+-----------+------------+--------+   CFV            no                                                   +--------------+---------+------+-----------+------------+--------+   FV mid         no                                                   +--------------+---------+------+-----------+------------+--------+   Popliteal      no                                                   +--------------+---------+------+-----------+------------+--------+   GSV at SFJ                yes     >500 ms       0.72                 +--------------+---------+------+-----------+------------+--------+   GSV prox thigh no                               0.55                +--------------+---------+------+-----------+------------+--------+   GSV mid thigh             yes     >500 ms       0.51                +--------------+---------+------+-----------+------------+--------+   GSV dist thigh            yes     >500 ms       0.45                +--------------+---------+------+-----------+------------+--------+   GSV at knee               yes     >500 ms       0.59                +--------------+---------+------+-----------+------------+--------+   GSV prox  calf             yes     >500 ms       0.34                +--------------+---------+------+-----------+------------+--------+   GSV mid calf              yes     >500 ms       0.32                +--------------+---------+------+-----------+------------+--------+   SSV Pop Fossa  no                               0.39                +--------------+---------+------+-----------+------------+--------+   SSV prox calf  no                               0.15                +--------------+---------+------+-----------+------------+--------+   SSV mid calf   no                               0.27                +--------------+---------+------+-----------+------------+--------+   AASV p                    yes     >500 ms       0.45                +--------------+---------+------+-----------+------------+--------+   AASV mid       no                               0.32                +--------------+---------+------+-----------+------------+--------+      +--------------+---------+------+-----------+------------+--------+   LEFT           Reflux No Reflux Reflux Time Diameter cms Comments                              Yes                                       +--------------+---------+------+-----------+------------+--------+   CFV                       yes    >1 second                           +--------------+---------+------+-----------+------------+--------+   FV mid         no                                                   +--------------+---------+------+-----------+------------+--------+   Popliteal      no                                                   +--------------+---------+------+-----------+------------+--------+  GSV at [facility]                yes     >500 ms       0.65                +--------------+---------+------+-----------+------------+--------+   GSV prox thigh            yes     >500 ms       0.55                +--------------+---------+------+-----------+------------+--------+   GSV mid thigh  no                               0.44                +--------------+---------+------+-----------+------------+--------+   GSV dist thigh no                               0.42                +--------------+---------+------+-----------+------------+--------+   GSV at knee    no                               0.47                +--------------+---------+------+-----------+------------+--------+   GSV prox calf  no                               0.24                +--------------+---------+------+-----------+------------+--------+   GSV mid calf   no                               0.21                +--------------+---------+------+-----------+------------+--------+   SSV Pop Fossa  no                               0.34                +--------------+---------+------+-----------+------------+--------+   SSV prox calf  no                               0.17                +--------------+---------+------+-----------+------------+--------+   SSV mid calf   no                               0.26                +--------------+---------+------+-----------+------------+--------+    Summary:  Right:  - No evidence of deep vein thrombosis seen in the right lower extremity,  from the common femoral through the popliteal veins.  - No evidence of  superficial venous reflux seen in the right short  saphenous vein.  - Venous reflux is noted in the right sapheno-femoral junction.  - Venous reflux is noted in the right greater saphenous vein in the  thigh.  - Venous reflux is noted in the right greater saphenous vein in the calf.     Left:  - No evidence of deep vein thrombosis seen in the left lower extremity,  from the common femoral through the popliteal veins.  - No evidence of superficial venous reflux seen in the left short  saphenous vein.  - Venous reflux is noted in the left common femoral vein.  - Venous reflux is noted in the left sapheno-femoral junction.  - Venous reflux is noted in the left greater saphenous vein in the thigh.   Shannon Quinn is a 51 y.o. female who presents with: BLE leg swelling  -pt has palpable DP pedal pulses bilaterally -pt does not have evidence of DVT.  On the right leg, pt does have venous reflux in the GSV at the [facility] throughout the GSV down to the mid calf.  The vein measures 0.45-0.72cm down to the knee.  She also has an accessory vein with reflux that measures 0.45cm.  on the left, she does have deep venous reflux as well as at the GSV at the [facility] and proximal thigh and no other reflux.   -discussed with pt about wearing thigh high 20-30 mmHg compression stockings and pt was measured for these today.    -discussed the importance of leg elevation and how to elevate properly - pt is advised to elevate their legs and a diagram is given to them to demonstrate for pt to lay flat on their back with knees elevated and slightly bent with their feet higher than their knees, which puts their feet higher than their heart for 15 minutes per day.  If pt cannot lay flat, advised to lay as flat as possible.  -pt is advised to continue as much walking as possible and avoid sitting or standing for long periods of time.  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 -discussed  importance of weight loss and exercise and that water aerobics would also be beneficial.  She does walk 30 minutes daily. -handout with recommendations given -pt will f/u in 3 months for consideration for laser ablation of the right leg.   -of note, her uncle did have hx of AAA.  He was a smoker.  I discussed with pt that there is a familial component to this and she would need to be checked in the future.  She does have in her favor that she is not a smoker.    Leontine Locket, [facility] 08/22/2021 2:58 PM  Clinic MD:  Donzetta Matters

## 2021-09-07 ENCOUNTER — Other Ambulatory Visit: Payer: Self-pay

## 2021-09-07 ENCOUNTER — Ambulatory Visit: Payer: BC Managed Care – PPO | Admitting: Family Medicine

## 2021-09-07 ENCOUNTER — Encounter: Payer: Self-pay | Admitting: Family Medicine

## 2021-09-07 VITALS — BP 115/78 | HR 70 | Temp 98.3°F | Wt 241.6 lb

## 2021-09-07 DIAGNOSIS — R350 Frequency of micturition: Secondary | ICD-10-CM | POA: Diagnosis not present

## 2021-09-07 DIAGNOSIS — R109 Unspecified abdominal pain: Secondary | ICD-10-CM

## 2021-09-07 LAB — POCT URINALYSIS DIPSTICK
Glucose, UA: POSITIVE — AB
Protein, UA: POSITIVE — AB
Spec Grav, UA: 1.025 (ref 1.010–1.025)
Urobilinogen, UA: 4 E.U./dL — AB
pH, UA: 5 (ref 5.0–8.0)

## 2021-09-07 MED ORDER — NITROFURANTOIN MONOHYD MACRO 100 MG PO CAPS: 100.0000 mg | ORAL_CAPSULE | Freq: Two times a day (BID) | ORAL | 0 refills | Status: DC

## 2021-09-07 NOTE — Patient Instructions (Addendum)
Start Macrobid every 12 hours for 5 days. This med is concentrated in the bladder so it can work better for bladder symptoms like you have.   Interstitial Cystitis Interstitial cystitis is inflammation of the bladder. This condition is also known as painful bladder syndrome. This may cause pain in the bladder area as well as a frequent and urgent need to urinate. The bladder is an organ that stores urine after the urine is made in the kidneys. The severity of interstitial cystitis can vary from person to person. You may have flare-ups, and then your symptoms may go away for a while. For many people, it becomes a long-term (chronic) problem. What are the causes? The cause of this condition is not known. What increases the risk? The following factors may make you more likely to develop this condition: Being female. Having fibromyalgia. Having irritable bowel syndrome (IBS). Having endometriosis. Having chronic fatigue syndrome. This condition may be aggravated by: Stress. Smoking. Spicy foods. What are the signs or symptoms? Symptoms of interstitial cystitis vary, and they can change over time. Symptoms may include: Discomfort or pain in the bladder area, which is in the lower abdomen. Pain can range from mild to severe. The pain may change in intensity as the bladder fills with urine or as it empties. Pain in the pelvic area, between the hip bones. A constant urge to urinate. Frequent urination. Pain during urination. Pain during sex. Blood in the urine. Feeling tired (fatigue). For women, symptoms often get worse during menstruation. How is this diagnosed? This condition is diagnosed based on your symptoms, your medical history, and a physical exam. Your health care provider may need to rule out other conditions and may order other tests, such as: Urine tests. Cystoscopy. For this test, a tool similar to a very thin telescope is used to look into your bladder. Biopsy. This  involves taking a sample of tissue from the bladder to be examined under a microscope. How is this treated? There is no cure for this condition, but treatment can help you control your symptoms. Work closely with your health care provider to find the most effective treatments for you. Treatment options may include: Medicines to relieve pain and reduce how often you feel the need to urinate. This treatment may include: A procedure where a small amount of medicine that eases irritation is put inside your bladder through a catheter (bladder instillation). Lifestyle changes, such as changing your diet or taking steps to control stress. Physical therapy. This may include: Exercises to help relax the pelvic floor muscles. Massage to relax tight muscles (myofascial release). Learning ways to control when you urinate (bladder training). Using a device that provides electrical stimulation to your nerves, which can relieve pain (neuromodulation therapy). The device is placed on your back, where it blocks the nerves that cause you to feel pain in your bladder area. A procedure that stretches your bladder by filling it with air or fluid (hydrodistention). Surgery. This is rare. It is only done for extreme cases, if other treatments do not help. Follow these instructions at home: Lifestyle Learn and practice relaxation techniques, such as deep breathing and muscle relaxation. Get care for your body and mental well-being, such as: Cognitive behavioral therapy (CBT). This therapy changes the way you think or act in response to different situations. This may improve how you feel. Seeing a mental health therapist to evaluate and treat depression, if necessary. Work with your health care provider on other ways to manage  pain. Acupuncture may be helpful. Avoid drinking alcohol. Do not use any products that contain nicotine or tobacco. These products include cigarettes, chewing tobacco, and vaping devices, such as  e-cigarettes. If you need help quitting, ask your health care provider. Eating and drinking Make dietary changes as recommended by your health care provider. You may need to avoid: Spicy foods. Foods that contain a lot of potassium. Limit your intake of drinks that increase your urge to urinate. These include alcohol and caffeinated drinks like soda, coffee, and tea. Bladder training  Use bladder training techniques as directed. Techniques may include: Urinating at scheduled times. Training yourself to delay urination. Keep a bladder diary. Write down the times you urinate and any symptoms that you have. This can help you find out which foods, liquids, or activities make your symptoms worse. Use your bladder diary to schedule bathroom trips. If you are away from home, plan to be near a bathroom at each of your scheduled times. Make sure that you urinate just before you leave the house and just before you go to bed. General instructions Take over-the-counter and prescription medicines only as told by your health care provider. Try a warm or cool compress over your bladder for comfort. Avoid wearing tight clothing. Do exercises to relax your pelvic floor muscles as told by your physical therapist. Keep all follow-up visits. This is important. Where to find more information To find more information or a support group near you, visit: [employer]: urologyhealth.org Interstitial Cystitis Association: ClassPreviews.com.br Contact a health care provider if you have: Symptoms that do not get better with treatment. Pain or discomfort that gets worse. More frequent urges to urinate. A fever. Get help right away if: You have no control over when you urinate. Summary Interstitial cystitis is inflammation of the bladder. This condition may cause pain in the bladder area as well as a frequent and urgent need to urinate. You may have flare-ups of the condition, and then it may go away for a  while. For many people, it becomes a long-term (chronic) problem. There is no cure for interstitial cystitis, but treatment methods are available to control your symptoms. This information is not intended to replace advice given to you by your health care provider. Make sure you discuss any questions you have with your health care provider. Document Revised: 03/03/2020 Document Reviewed: 03/03/2020 Elsevier Patient Education  [place].

## 2021-09-07 NOTE — Progress Notes (Signed)
This visit occurred during the SARS-CoV-2 public health emergency.  Safety protocols were in place, including screening questions prior to the visit, additional usage of staff PPE, and extensive cleaning of exam room while observing appropriate contact time as indicated for disinfecting solutions.    Shannon Quinn , 1970/11/05, 51 y.o., female MRN: [id number] Patient Care Team    Relationship Specialty Notifications Start End  Ma Hillock, DO PCP - General Family Medicine  11/03/17   [place], [facility]    11/03/17   Everlene Farrier, MD Consulting Physician Obstetrics and Gynecology  11/03/17   Jerrell Belfast, MD Consulting Physician Otolaryngology  11/03/17   Alda Berthold, DO Consulting Physician Neurology  11/03/17   Netta Cedars, MD Consulting Physician Orthopedic Surgery  06/01/19   Fanny Skates, MD Consulting Physician General Surgery  06/01/19   Irene Shipper, MD Consulting Physician Gastroenterology  05/23/21   [name], MD Attending Physician Cardiology  05/23/21     Chief Complaint  Patient presents with   Urinary Frequency     Subjective: Pt presents for an OV with complaints of urinary frequency and polydipsia.  Xmas had migraine- after migraine felt urinary frequency. After new year, felt nauseated, sweaty and had left flank pain. Pain and pressure suprapubic.  Pt has tried azo.   Allergic to sulfa and Augmentin. Symptoms were improved when taking pyridium.  Was prescribed keflex 500 BID w/ out resolution of symptoms through UC visit 08/20/2021. She states urgent care called her back and told her she did not have a uti by culture.  She reports she drinks different types of fruit juices daily.  She has been eating more yogurt than prior.  Outside of the mention changes she does not believe her dietary habits have changed much since onset of her symptoms.  She denies dysuria   Depression screen [facility] 2/9 05/23/2021 12/29/2020 11/17/2020 10/12/2020  05/30/2020  Decreased Interest 1 0 1 0 1  Down, Depressed, Hopeless - 0 1 0 1  PHQ - 2 Score 1 0 2 0 2  Altered sleeping 0 - 2 1 0  Tired, decreased energy 2 - 3 1 1   Change in appetite 0 - 1 0 0  Feeling bad or failure about yourself  1 - 1 0 2  Trouble concentrating 1 - 0 1 0  Moving slowly or fidgety/restless 1 - 3 3 1   Suicidal thoughts 0 - 0 0 0  PHQ-9 Score 6 - 12 6 6   Difficult doing work/chores - - - - -    Allergies  Allergen Reactions   Chlorhexidine Other (See Comments)    Red and purple burns and peeled skin. Cannot use surgical glue.  Pt states Betadine is safe and without reaction.   Neomycin     UNSPECIFIED REACTION    Other Other (See Comments)    Body glue, turns skin blue   Sulfamethoxazole-Trimethoprim     UNSPECIFIED REACTION    Sulfasalazine Other (See Comments)   Sulfonamide Derivatives     UNSPECIFIED REACTION    Augmentin [Amoxicillin-Pot Clavulanate] Nausea And Vomiting    amox ok.    Hydrocodone Nausea And Vomiting    Severe vomiting   Oxycodone Nausea And Vomiting    Severe vomiting   Prednisone Other (See Comments)    insomnia   Social History   Social History Narrative   Regular exercise: walking 1 mile per day   Caffeine use: soda drinker   Lives  with husband and 2 children in a one story home.     Works for [employer].  She works in Science writer   Education: associates degree.     Past Medical History:  Diagnosis Date   Abnormality of right breast on screening mammogram 10/09/2018   Allergy    Atypical chest pain 06/07/2019   Claudication in peripheral vascular disease ([place]) 07/10/2021   Colon polyps 2011   3 polpys removed   Complication of anesthesia    "severe Reaction" "Physically ill"    Deviated septum    Family history of adverse reaction to anesthesia    violent when waking up from anesthesia - very sick   Hypertension    Hypoglycemic syndrome 1996   during pregnancy    Hypothyroidism    Impingement syndrome of right shoulder region 06/04/2018   Lymphedema of right arm    Melanoma ([place])    03/10/2018   PONV (postoperative nausea and vomiting)    Shoulder injury    right - side happened from last surgery   Thyroid disease    Varicose vein of leg    Past Surgical History:  Procedure Laterality Date   BREAST LUMPECTOMY WITH RADIOACTIVE SEED LOCALIZATION Right 10/09/2018   Procedure: RIGHT BREAST LUMPECTOMY WITH RADIOACTIVE SEED LOCALIZATION ERAS PATHWAY;  Surgeon: Fanny Skates, MD;  Location: [place];  Service: General;  Laterality: Right;   COLONOSCOPY     polyps   LAPAROSCOPIC TOTAL HYSTERECTOMY  09/04/2017   Dr. Gaetano Net for endometriosis   MELANOMA EXCISION WITH SENTINEL LYMPH NODE BIOPSY Left 04/02/2018   Procedure: INJECT BLUE DYE MELANOMA LEFT BACK WITH  WIDE LOCAL EXCISION MELANOMA LEFT BACK BILATERAL AXILLARY DEEP SENTINEL LYMPH NODE BIOPSY;  Surgeon: Fanny Skates, MD;  Location: [place];  Service: General;  Laterality: Left;   TUBAL LIGATION     WISDOM TOOTH EXTRACTION     Family History  Problem Relation Age of Onset   Arthritis Mother    Dementia Mother 71   Stroke Mother    Alcohol abuse Father    Arthritis Father    Hyperlipidemia Father    Stroke Father        2012   Depression Father    Diabetes Father    Heart disease Father 30   Heart failure Father    Heart attack Father    Diabetes Sister    Dementia Maternal Grandmother    Heart disease Maternal Grandfather 81   CAD Maternal Grandfather    Diabetes Paternal Grandmother    COPD Paternal Grandfather        emphysema   Cancer Paternal Uncle        breast cancer   Colon cancer Paternal Uncle    CAD Maternal Uncle    Colon cancer Maternal Uncle    CAD Paternal Aunt    Congestive Heart Failure Paternal Aunt    Kidney failure Paternal Aunt    Cancer Maternal Uncle    Esophageal cancer Neg Hx    Stomach cancer Neg Hx    Rectal  cancer Neg Hx    Allergies as of 09/07/2021       Reactions   Chlorhexidine Other (See Comments)   Red and purple burns and peeled skin. Cannot use surgical glue.  Pt states Betadine is safe and without reaction.   Neomycin    UNSPECIFIED REACTION    Other Other (See Comments)   Body glue, turns  skin blue   Sulfamethoxazole-trimethoprim    UNSPECIFIED REACTION    Sulfasalazine Other (See Comments)   Sulfonamide Derivatives    UNSPECIFIED REACTION    Augmentin [amoxicillin-pot Clavulanate] Nausea And Vomiting   amox ok.    Hydrocodone Nausea And Vomiting   Severe vomiting   Oxycodone Nausea And Vomiting   Severe vomiting   Prednisone Other (See Comments)   insomnia        Medication List        Accurate as of September 07, 2021 12:29 PM. If you have any questions, ask your nurse or doctor.          amitriptyline 10 MG tablet Commonly known as: ELAVIL Take 1 tablet (10 mg total) by mouth at bedtime.   aspirin EC 81 MG tablet Take 1 tablet (81 mg total) by mouth daily.   hydrochlorothiazide 25 MG tablet Commonly known as: HYDRODIURIL Take 1 tablet (25 mg total) by mouth daily.   hydrOXYzine 10 MG tablet Commonly known as: ATARAX Take 1-3 tablets (10-30 mg total) by mouth 2 (two) times daily as needed.   nitrofurantoin (macrocrystal-monohydrate) 100 MG capsule Commonly known as: Macrobid Take 1 capsule (100 mg total) by mouth 2 (two) times daily. Started by: Howard Pouch, DO   Omega-3 1000 MG Caps Take 2,000 mg by mouth.   ondansetron 4 MG tablet Commonly known as: ZOFRAN Take 1-2 tablets (4-8 mg total) by mouth every 8 (eight) hours as needed for nausea or vomiting.   OVER THE COUNTER MEDICATION   promethazine 25 MG suppository Commonly known as: Phenergan Place 1 suppository (25 mg total) rectally every 6 (six) hours as needed for nausea or vomiting.   rizatriptan 5 MG tablet Commonly known as: Maxalt Take 1 tablet (5 mg total) by mouth as needed  for migraine. May repeat in 2 hours if needed   topiramate 50 MG tablet Commonly known as: TOPAMAX Take 1 tablet (50 mg total) by mouth daily.   venlafaxine XR 75 MG 24 hr capsule Commonly known as: EFFEXOR-XR Take 1 capsule (75 mg total) by mouth daily.        All past medical history, surgical history, allergies, family history, immunizations andmedications were updated in the EMR today and reviewed under the history and medication portions of their EMR.     ROS Negative, with the exception of above mentioned in HPI   Objective:  BP 115/78    Pulse 70    Temp 98.3 F (36.8 C)    Wt 241 lb 9.6 oz (109.6 kg)    LMP 08/23/2017    SpO2 95%    BMI 40.45 kg/m  Body mass index is 40.45 kg/m.  Physical Exam Vitals and nursing note reviewed.  Constitutional:      General: She is not in acute distress.    Appearance: Normal appearance. She is normal weight. She is not ill-appearing or toxic-appearing.  Eyes:     Extraocular Movements: Extraocular movements intact.     Conjunctiva/sclera: Conjunctivae normal.     Pupils: Pupils are equal, round, and reactive to light.  Abdominal:     General: Abdomen is flat. There is no distension.     Palpations: There is no mass.     Tenderness: There is abdominal tenderness. There is no right CVA tenderness, left CVA tenderness, guarding or rebound.  Neurological:     Mental Status: She is alert and oriented to person, place, and time. Mental status is at baseline.  Psychiatric:  Mood and Affect: Mood normal.        Behavior: Behavior normal.        Thought Content: Thought content normal.        Judgment: Judgment normal.      No results found. No results found. Results for orders placed or performed in visit on 09/07/21 (from the past 24 hour(s))  POCT Urinalysis Dipstick     Status: Abnormal   Collection Time: 09/07/21 11:49 AM  Result Value Ref Range   Color, UA orange    Clarity, UA clear    Glucose, UA Positive (A)  Negative   Bilirubin, UA 3+    Ketones, UA +-    Spec Grav, UA 1.025 1.010 - 1.025   Blood, UA 1+    pH, UA 5.0 5.0 - 8.0   Protein, UA Positive (A) Negative   Urobilinogen, UA 4.0 (A) 0.2 or 1.0 E.U./dL   Nitrite, UA +    Leukocytes, UA Large (3+) (A) Negative   Appearance     Odor      Assessment/Plan: Odilia Damico is a 51 y.o. female present for OV for  Flank pain/ Urinary frequency Hydrate.  Macrobid BID x 5 days Bladder irritant guide and abacterial cystitis guide provided to patient .  - POCT Urinalysis Dipstick> abnl- however pt reported azo use in last 24 hours.  - Urinalysis w microscopic + reflex cultur If symptoms are not improved and studies do not suggest cause> will need to consider CT r/o stone, which is unlikely since she is not in pain currently. Vs tx for IC.  Reviewed expectations re: course of current medical issues. Discussed self-management of symptoms. Outlined signs and symptoms indicating need for more acute intervention. Patient verbalized understanding and all questions were answered. Patient received an After-Visit Summary.    Orders Placed This Encounter  Procedures   Urinalysis w microscopic + reflex cultur   POCT Urinalysis Dipstick   Meds ordered this encounter  Medications   nitrofurantoin, macrocrystal-monohydrate, (MACROBID) 100 MG capsule    Sig: Take 1 capsule (100 mg total) by mouth 2 (two) times daily.    Dispense:  10 capsule    Refill:  0   Referral Orders  No referral(s) requested today     Note is dictated utilizing voice recognition software. Although note has been proof read prior to signing, occasional typographical errors still can be missed. If any questions arise, please do not hesitate to call for verification.   electronically signed by:  Howard Pouch, DO  [place]

## 2021-09-07 NOTE — Addendum Note (Signed)
Addended by: Beryle Lathe S on: 09/07/2021 01:15 PM   Modules accepted: Orders

## 2021-09-09 LAB — URINALYSIS W MICROSCOPIC + REFLEX CULTURE
Bacteria, UA: NONE SEEN /HPF
Bilirubin Urine: NEGATIVE
Glucose, UA: NEGATIVE
Hgb urine dipstick: NEGATIVE
Hyaline Cast: NONE SEEN /LPF
Ketones, ur: NEGATIVE
Nitrites, Initial: POSITIVE — AB
Protein, ur: NEGATIVE
RBC / HPF: NONE SEEN /HPF (ref 0–2)
Specific Gravity, Urine: 1.005 (ref 1.001–1.035)
pH: 6.5 (ref 5.0–8.0)

## 2021-09-09 LAB — URINE CULTURE
MICRO NUMBER:: 12932902
SPECIMEN QUALITY:: ADEQUATE

## 2021-09-09 LAB — CULTURE INDICATED

## 2021-10-01 DIAGNOSIS — Z13220 Encounter for screening for lipoid disorders: Secondary | ICD-10-CM | POA: Diagnosis not present

## 2021-10-01 DIAGNOSIS — E7800 Pure hypercholesterolemia, unspecified: Secondary | ICD-10-CM | POA: Diagnosis not present

## 2021-10-01 DIAGNOSIS — Z713 Dietary counseling and surveillance: Secondary | ICD-10-CM | POA: Diagnosis not present

## 2021-10-01 DIAGNOSIS — I10 Essential (primary) hypertension: Secondary | ICD-10-CM | POA: Diagnosis not present

## 2021-10-01 DIAGNOSIS — Z136 Encounter for screening for cardiovascular disorders: Secondary | ICD-10-CM | POA: Diagnosis not present

## 2021-11-15 ENCOUNTER — Ambulatory Visit: Payer: BC Managed Care – PPO | Admitting: Family Medicine

## 2021-11-15 ENCOUNTER — Encounter: Payer: Self-pay | Admitting: Family Medicine

## 2021-11-15 VITALS — BP 101/68 | HR 68 | Temp 97.9°F | Ht 64.5 in | Wt 240.0 lb

## 2021-11-15 DIAGNOSIS — J329 Chronic sinusitis, unspecified: Secondary | ICD-10-CM | POA: Diagnosis not present

## 2021-11-15 DIAGNOSIS — J301 Allergic rhinitis due to pollen: Secondary | ICD-10-CM

## 2021-11-15 DIAGNOSIS — B9689 Other specified bacterial agents as the cause of diseases classified elsewhere: Secondary | ICD-10-CM

## 2021-11-15 MED ORDER — DOXYCYCLINE HYCLATE 100 MG PO TABS: 100.0000 mg | ORAL_TABLET | Freq: Two times a day (BID) | ORAL | 0 refills | Status: DC

## 2021-11-15 MED ORDER — FLUTICASONE PROPIONATE 50 MCG/ACT NA SUSP: 2.0000 | Freq: Every day | NASAL | 11 refills | Status: DC

## 2021-11-15 NOTE — Progress Notes (Signed)
? ? ? ?This visit occurred during the SARS-CoV-2 public health emergency.  Safety protocols were in place, including screening questions prior to the visit, additional usage of staff PPE, and extensive cleaning of exam room while observing appropriate contact time as indicated for disinfecting solutions.  ? ? ?Shannon Quinn , [birth year], 51 y.o., female ?MRN: [id number] ?Patient Care Team  ?  Relationship Specialty Notifications Start End  ?Ma Hillock, DO PCP - General Family Medicine  11/03/17   ?[place], [facility]    11/03/17   ?Everlene Farrier, MD Consulting Physician Obstetrics and Gynecology  11/03/17   ?Jerrell Belfast, MD Consulting Physician Otolaryngology  11/03/17   ?Alda Berthold, DO Consulting Physician Neurology  11/03/17   ?Netta Cedars, MD Consulting Physician Orthopedic Surgery  06/01/19   ?Fanny Skates, MD Consulting Physician General Surgery  06/01/19   ?Irene Shipper, MD Consulting Physician Gastroenterology  05/23/21   ?[name], MD Attending Physician Cardiology  05/23/21   ? ? ?Chief Complaint  ?Patient presents with  ? Nasal Congestion  ?  Pt c/o nasal congestion, bleed and drainage, post nasal drip, productive cough x 6 weeks  ? ?  ?Subjective: Pt presents for an OV with complaints of nasal congestion, PND, cough and nasal drainage  of 6 weeks duration.  Associated symptoms include sinus pressure and nasal bleeding. ? ?Pt has tried allegra, dayquil and nyquil to ease their symptoms.  ? ? ?  11/15/2021  ?  9:07 AM 05/23/2021  ? 10:04 AM 12/29/2020  ?  2:41 PM 11/17/2020  ? 10:49 AM 10/12/2020  ? 11:10 AM  ?Depression screen PHQ 2/9  ?Decreased Interest 2 1 0 1 0  ?Down, Depressed, Hopeless 2  0 1 0  ?PHQ - 2 Score 4 1 0 2 0  ?Altered sleeping 3 0  2 1  ?Tired, decreased energy '2 2  3 1  '$ ?Change in appetite 1 0  1 0  ?Feeling bad or failure about yourself  0 1  1 0  ?Trouble concentrating 2 1  0 1  ?Moving slowly or fidgety/restless '2 1  3 3  '$ ?Suicidal thoughts 0 0  0 0   ?PHQ-9 Score '14 6  12 6  '$ ? ? ?Allergies  ?Allergen Reactions  ? Chlorhexidine Other (See Comments)  ?  Red and purple burns and peeled skin. Cannot use surgical glue.  ?Pt states Betadine is safe and without reaction.  ? Neomycin   ?  UNSPECIFIED REACTION   ? Other Other (See Comments)  ?  Body glue, turns skin blue  ? Sulfamethoxazole-Trimethoprim   ?  UNSPECIFIED REACTION   ? Sulfasalazine Other (See Comments)  ? Sulfonamide Derivatives   ?  UNSPECIFIED REACTION   ? Augmentin [Amoxicillin-Pot Clavulanate] Nausea And Vomiting  ?  amox ok.   ? Hydrocodone Nausea And Vomiting  ?  Severe vomiting  ? Oxycodone Nausea And Vomiting  ?  Severe vomiting  ? Prednisone Other (See Comments)  ?  insomnia  ? ?Social History  ? ?Social History Narrative  ? Regular exercise: walking 1 mile per day  ? Caffeine use: soda drinker  ? Lives with husband and 2 children in a one story home.    ? Works for [employer].  She works in Sales executive for validation debt process  ? Education: associates degree.    ? ?Past Medical History:  ?Diagnosis Date  ? Abnormality of right breast on screening mammogram 10/09/2018  ?  Allergy   ? Atypical chest pain 06/07/2019  ? Claudication in peripheral vascular disease ([place]) 07/10/2021  ? Colon polyps 2011  ? 3 polpys removed  ? Complication of anesthesia   ? "severe Reaction" "Physically ill"   ? Deviated septum   ? Family history of adverse reaction to anesthesia   ? violent when waking up from anesthesia - very sick  ? Hypertension   ? Hypoglycemic syndrome 1996  ? during pregnancy  ? Hypothyroidism   ? Impingement syndrome of right shoulder region 06/04/2018  ? Lymphedema of right arm   ? Melanoma ([place])   ? 03/10/2018  ? PONV (postoperative nausea and vomiting)   ? Shoulder injury   ? right - side happened from last surgery  ? Thyroid disease   ? Varicose vein of leg   ? ?Past Surgical History:  ?Procedure Laterality Date  ? BREAST LUMPECTOMY WITH RADIOACTIVE SEED LOCALIZATION Right 10/09/2018   ? Procedure: RIGHT BREAST LUMPECTOMY WITH RADIOACTIVE SEED LOCALIZATION ERAS PATHWAY;  Surgeon: Fanny Skates, MD;  Location: [place];  Service: General;  Laterality: Right;  ? COLONOSCOPY    ? polyps  ? LAPAROSCOPIC TOTAL HYSTERECTOMY  09/04/2017  ? Dr. Gaetano Net for endometriosis  ? MELANOMA EXCISION WITH SENTINEL LYMPH NODE BIOPSY Left 04/02/2018  ? Procedure: INJECT BLUE DYE MELANOMA LEFT BACK WITH  WIDE LOCAL EXCISION MELANOMA LEFT BACK BILATERAL AXILLARY DEEP SENTINEL LYMPH NODE BIOPSY;  Surgeon: Fanny Skates, MD;  Location: [place];  Service: General;  Laterality: Left;  ? TUBAL LIGATION    ? WISDOM TOOTH EXTRACTION    ? ?Family History  ?Problem Relation Age of Onset  ? Arthritis Mother   ? Dementia Mother 34  ? Stroke Mother   ? Alcohol abuse Father   ? Arthritis Father   ? Hyperlipidemia Father   ? Stroke Father   ?     2012  ? Depression Father   ? Diabetes Father   ? Heart disease Father 7  ? Heart failure Father   ? Heart attack Father   ? Diabetes Sister   ? Dementia Maternal Grandmother   ? Heart disease Maternal Grandfather 25  ? CAD Maternal Grandfather   ? Diabetes Paternal Grandmother   ? COPD Paternal Grandfather   ?     emphysema  ? Cancer Paternal Uncle   ?     breast cancer  ? Colon cancer Paternal Uncle   ? CAD Maternal Uncle   ? Colon cancer Maternal Uncle   ? CAD Paternal Aunt   ? Congestive Heart Failure Paternal Aunt   ? Kidney failure Paternal Aunt   ? Cancer Maternal Uncle   ? Esophageal cancer Neg Hx   ? Stomach cancer Neg Hx   ? Rectal cancer Neg Hx   ? ?Allergies as of 11/15/2021   ? ?   Reactions  ? Chlorhexidine Other (See Comments)  ? Red and purple burns and peeled skin. Cannot use surgical glue.  ?Pt states Betadine is safe and without reaction.  ? Neomycin   ? UNSPECIFIED REACTION   ? Other Other (See Comments)  ? Body glue, turns skin blue  ? Sulfamethoxazole-trimethoprim   ? UNSPECIFIED REACTION   ? Sulfasalazine Other (See Comments)  ?  Sulfonamide Derivatives   ? UNSPECIFIED REACTION   ? Augmentin [amoxicillin-pot Clavulanate] Nausea And Vomiting  ? amox ok.   ? Hydrocodone Nausea And Vomiting  ? Severe vomiting  ? Oxycodone Nausea And Vomiting  ?  Severe vomiting  ? Prednisone Other (See Comments)  ? insomnia  ? ?  ? ?  ?Medication List  ?  ? ?  ? Accurate as of November 15, 2021  9:26 AM. If you have any questions, ask your nurse or doctor.  ?  ?  ? ?  ? ?STOP taking these medications   ? ?nitrofurantoin (macrocrystal-monohydrate) 100 MG capsule ?Commonly known as: Macrobid ?Stopped by: Howard Pouch, DO ?  ? ?  ? ?TAKE these medications   ? ?amitriptyline 10 MG tablet ?Commonly known as: ELAVIL ?Take 1 tablet (10 mg total) by mouth at bedtime. ?  ?aspirin EC 81 MG tablet ?Take 1 tablet (81 mg total) by mouth daily. ?  ?doxycycline 100 MG tablet ?Commonly known as: VIBRA-TABS ?Take 1 tablet (100 mg total) by mouth 2 (two) times daily. ?Started by: Howard Pouch, DO ?  ?fluticasone 50 MCG/ACT nasal spray ?Commonly known as: FLONASE ?Place 2 sprays into both nostrils daily. ?Started by: Howard Pouch, DO ?  ?hydrochlorothiazide 25 MG tablet ?Commonly known as: HYDRODIURIL ?Take 1 tablet (25 mg total) by mouth daily. ?  ?hydrOXYzine 10 MG tablet ?Commonly known as: ATARAX ?Take 1-3 tablets (10-30 mg total) by mouth 2 (two) times daily as needed. ?  ?Omega-3 1000 MG Caps ?Take 2,000 mg by mouth. ?  ?ondansetron 4 MG tablet ?Commonly known as: ZOFRAN ?Take 1-2 tablets (4-8 mg total) by mouth every 8 (eight) hours as needed for nausea or vomiting. ?  ?OVER THE COUNTER MEDICATION ?  ?promethazine 25 MG suppository ?Commonly known as: Phenergan ?Place 1 suppository (25 mg total) rectally every 6 (six) hours as needed for nausea or vomiting. ?  ?rizatriptan 5 MG tablet ?Commonly known as: Maxalt ?Take 1 tablet (5 mg total) by mouth as needed for migraine. May repeat in 2 hours if needed ?  ?topiramate 50 MG tablet ?Commonly known as: TOPAMAX ?Take 1 tablet (50  mg total) by mouth daily. ?  ?venlafaxine XR 75 MG 24 hr capsule ?Commonly known as: EFFEXOR-XR ?Take 1 capsule (75 mg total) by mouth daily. ?  ? ?  ? ? ?All past medical history, surgical history, alle

## 2021-11-15 NOTE — Patient Instructions (Signed)

## 2021-11-19 ENCOUNTER — Ambulatory Visit: Payer: BC Managed Care – PPO | Admitting: Surgery

## 2021-11-27 DIAGNOSIS — C439 Malignant melanoma of skin, unspecified: Secondary | ICD-10-CM | POA: Diagnosis not present

## 2021-11-27 DIAGNOSIS — M79629 Pain in unspecified upper arm: Secondary | ICD-10-CM | POA: Diagnosis not present

## 2021-11-27 DIAGNOSIS — C779 Secondary and unspecified malignant neoplasm of lymph node, unspecified: Secondary | ICD-10-CM | POA: Diagnosis not present

## 2021-11-27 DIAGNOSIS — C4359 Malignant melanoma of other part of trunk: Secondary | ICD-10-CM | POA: Diagnosis not present

## 2021-11-30 ENCOUNTER — Other Ambulatory Visit: Payer: Self-pay | Admitting: General Surgery

## 2021-11-30 DIAGNOSIS — M79629 Pain in unspecified upper arm: Secondary | ICD-10-CM

## 2021-12-03 ENCOUNTER — Ambulatory Visit: Payer: BC Managed Care – PPO | Admitting: Surgery

## 2021-12-03 ENCOUNTER — Encounter: Payer: Self-pay | Admitting: Surgery

## 2021-12-03 VITALS — BP 112/77 | HR 62 | Temp 98.1°F | Resp 20 | Ht 64.5 in | Wt 240.9 lb

## 2021-12-03 DIAGNOSIS — I83893 Varicose veins of bilateral lower extremities with other complications: Secondary | ICD-10-CM | POA: Diagnosis not present

## 2021-12-03 NOTE — Progress Notes (Signed)
? ?[facility] ? ?Patient name: Shannon Quinn MRN: [id number] DOB: March 15, 1971 Sex: female ? ? ?REASON FOR VISIT:  ? ? ?Follow up ? ?HISOTRY OF PRESENT ILLNESS:  ? ? ?Shannon Quinn is a 51 y.o. female who returns today for follow-up of her bilateral leg swelling.  She was first seen by Samantha Ryne in January 2023.  She denies any history of DVT.  She does have a family history of varicose veins in her mother and grandmother.  She has never had any procedures on her veins.  She states that her swelling is aggravated by prolonged standing.  She was given a prescription for 20-30 compression stockings, thigh-high.  She was also encouraged to keep her legs elevated.  She is back today for follow-up ? ? ?PAST MEDICAL HISTORY:  ? ?Past Medical History:  ?Diagnosis Date  ? Abnormality of right breast on screening mammogram 10/09/2018  ? Allergy   ? Atypical chest pain 06/07/2019  ? Claudication in peripheral vascular disease ([place]) 07/10/2021  ? Colon polyps 2011  ? 3 polpys removed  ? Complication of anesthesia   ? "severe Reaction" "Physically ill"   ? Deviated septum   ? Family history of adverse reaction to anesthesia   ? violent when waking up from anesthesia - very sick  ? Hypertension   ? Hypoglycemic syndrome 1996  ? during pregnancy  ? Hypothyroidism   ? Impingement syndrome of right shoulder region 06/04/2018  ? Lymphedema of right arm   ? Melanoma ([place])   ? 03/10/2018  ? PONV (postoperative nausea and vomiting)   ? Shoulder injury   ? right - side happened from last surgery  ? Thyroid disease   ? Varicose vein of leg   ? ? ? ?FAMILY HISTORY:  ? ?Family History  ?Problem Relation Age of Onset  ? Arthritis Mother   ? Dementia Mother 13  ? Stroke Mother   ? Alcohol abuse Father   ? Arthritis Father   ? Hyperlipidemia Father   ? Stroke Father   ?     2012  ? Depression Father   ? Diabetes Father   ? Heart disease Father 45  ? Heart failure Father   ? Heart  attack Father   ? Diabetes Sister   ? Dementia Maternal Grandmother   ? Heart disease Maternal Grandfather 62  ? CAD Maternal Grandfather   ? Diabetes Paternal Grandmother   ? COPD Paternal Grandfather   ?     emphysema  ? Cancer Paternal Uncle   ?     breast cancer  ? Colon cancer Paternal Uncle   ? CAD Maternal Uncle   ? Colon cancer Maternal Uncle   ? CAD Paternal Aunt   ? Congestive Heart Failure Paternal Aunt   ? Kidney failure Paternal Aunt   ? Cancer Maternal Uncle   ? Esophageal cancer Neg Hx   ? Stomach cancer Neg Hx   ? Rectal cancer Neg Hx   ? ? ?SOCIAL HISTORY:  ? ?Social History  ? ?Tobacco Use  ? Smoking status: Never  ? Smokeless tobacco: Never  ?Substance Use Topics  ? Alcohol use: Yes  ?  Alcohol/week: 0.0 standard drinks  ?  Comment: rarely  ? ? ? ?ALLERGIES:  ? ?Allergies  ?Allergen Reactions  ? Chlorhexidine Other (See Comments)  ?  Red and purple burns and peeled skin. Cannot use surgical glue.  ?Pt states Betadine is safe and without reaction.  ?  Neomycin   ?  UNSPECIFIED REACTION   ? Other Other (See Comments)  ?  Body glue, turns skin blue  ? Sulfamethoxazole-Trimethoprim   ?  UNSPECIFIED REACTION   ? Sulfasalazine Other (See Comments)  ? Sulfonamide Derivatives   ?  UNSPECIFIED REACTION   ? Augmentin [Amoxicillin-Pot Clavulanate] Nausea And Vomiting  ?  amox ok.   ? Hydrocodone Nausea And Vomiting  ?  Severe vomiting  ? Oxycodone Nausea And Vomiting  ?  Severe vomiting  ? Prednisone Other (See Comments)  ?  insomnia  ? ? ? ?CURRENT MEDICATIONS:  ? ?Current Outpatient Medications  ?Medication Sig Dispense Refill  ? amitriptyline (ELAVIL) 10 MG tablet Take 1 tablet (10 mg total) by mouth at bedtime. 90 tablet 1  ? aspirin EC 81 MG tablet Take 1 tablet (81 mg total) by mouth daily. 90 tablet 3  ? fluticasone (FLONASE) 50 MCG/ACT nasal spray Place 2 sprays into both nostrils daily. 16 g 11  ? hydrochlorothiazide (HYDRODIURIL) 25 MG tablet Take 1 tablet (25 mg total) by mouth daily. 90 tablet 1   ? hydrOXYzine (ATARAX/VISTARIL) 10 MG tablet Take 1-3 tablets (10-30 mg total) by mouth 2 (two) times daily as needed. 90 tablet 5  ? Omega-3 1000 MG CAPS Take 2,000 mg by mouth.    ? ondansetron (ZOFRAN) 4 MG tablet Take 1-2 tablets (4-8 mg total) by mouth every 8 (eight) hours as needed for nausea or vomiting. 40 tablet 5  ? OVER THE COUNTER MEDICATION     ? promethazine (PHENERGAN) 25 MG suppository Place 1 suppository (25 mg total) rectally every 6 (six) hours as needed for nausea or vomiting. 12 each 0  ? rizatriptan (MAXALT) 5 MG tablet Take 1 tablet (5 mg total) by mouth as needed for migraine. May repeat in 2 hours if needed 10 tablet 5  ? topiramate (TOPAMAX) 50 MG tablet Take 1 tablet (50 mg total) by mouth daily. 90 tablet 1  ? venlafaxine XR (EFFEXOR-XR) 75 MG 24 hr capsule Take 1 capsule (75 mg total) by mouth daily. 90 capsule 1  ? ?No current facility-administered medications for this visit.  ? ? ?REVIEW OF SYSTEMS:  ? ?'[X]'$  denotes positive finding, '[ ]'$  denotes negative finding ?Cardiac  Comments:  ?Chest pain or chest pressure:    ?Shortness of breath upon exertion:    ?Short of breath when lying flat:    ?Irregular heart rhythm:    ?    ?Vascular    ?Pain in calf, thigh, or hip brought on by ambulation:    ?Pain in feet at night that wakes you up from your sleep:     ?Blood clot in your veins:    ?Leg swelling:  x   ?    ?Pulmonary    ?Oxygen at home:    ?Productive cough:     ?Wheezing:     ?    ?Neurologic    ?Sudden weakness in arms or legs:     ?Sudden numbness in arms or legs:     ?Sudden onset of difficulty speaking or slurred speech:    ?Temporary loss of vision in one eye:     ?Problems with dizziness:     ?    ?Gastrointestinal    ?Blood in stool:     ?Vomited blood:     ?    ?Genitourinary    ?Burning when urinating:     ?Blood in urine:    ?    ?Psychiatric    ?  Major depression:     ?    ?Hematologic    ?Bleeding problems:    ?Problems with blood clotting too easily:    ?    ?Skin     ?Rashes or ulcers:    ?    ?Constitutional    ?Fever or chills:    ? ? ?PHYSICAL EXAM:  ? ?Vitals:  ? 12/03/21 0934  ?BP: 112/77  ?Pulse: 62  ?Resp: 20  ?Temp: 98.1 ?F (36.7 ?C)  ?SpO2: 96%  ?Weight: 240 lb 14.4 oz (109.3 kg)  ?Height: 5' 4.5" (1.638 m)  ? ? ?GENERAL: The patient is a well-nourished female, in no acute distress. The vital signs are documented above. ?CARDIAC: There is a regular rate and rhythm.  ?VASCULAR: 2+ pitting edema on the left, 1+ on the right.  Left medial knee varicosity ?PULMONARY: Non-labored respirations ?ABDOMEN: Soft and non-tender with normal pitched bowel sounds.  ?MUSCULOSKELETAL: There are no major deformities or cyanosis. ?NEUROLOGIC: No focal weakness or paresthesias are detected. ?SKIN: There are no ulcers or rashes noted. ?PSYCHIATRIC: The patient has a normal affect. ? ?STUDIES:  ? ?I have reviewed her vascular studies with the following findings: ?Venous Reflux Times  ?+--------------+---------+------+-----------+------------+--------+  ?RIGHT         Reflux NoRefluxReflux TimeDiameter cmsComments  ?                        Yes                                   ?+--------------+---------+------+-----------+------------+--------+  ?CFV           no                                              ?+--------------+---------+------+-----------+------------+--------+  ?FV mid        no                                              ?+--------------+---------+------+-----------+------------+--------+  ?Popliteal     no                                              ?+--------------+---------+------+-----------+------------+--------+  ?GSV at [facility]              yes    >500 ms      0.72              ?+--------------+---------+------+-----------+------------+--------+  ?GSV prox thighno                            0.55              ?+--------------+---------+------+-----------+------------+--------+  ?GSV mid thigh           yes    >500 ms       0.51              ?+--------------+---------+------+-----------+------------+--------+  ?GSV dist thigh          yes    >500  ms      0.45              ?+--------------+---------+------+-----------+------------+--------+  ?GSV at knee             yes    >5

## 2021-12-05 ENCOUNTER — Other Ambulatory Visit: Payer: Self-pay | Admitting: *Deleted

## 2021-12-05 DIAGNOSIS — I83892 Varicose veins of left lower extremities with other complications: Secondary | ICD-10-CM

## 2021-12-07 ENCOUNTER — Other Ambulatory Visit: Payer: Self-pay | Admitting: Family Medicine

## 2021-12-07 DIAGNOSIS — I10 Essential (primary) hypertension: Secondary | ICD-10-CM | POA: Diagnosis not present

## 2021-12-07 DIAGNOSIS — N951 Menopausal and female climacteric states: Secondary | ICD-10-CM

## 2021-12-07 DIAGNOSIS — E785 Hyperlipidemia, unspecified: Secondary | ICD-10-CM | POA: Diagnosis not present

## 2021-12-07 DIAGNOSIS — F419 Anxiety disorder, unspecified: Secondary | ICD-10-CM

## 2021-12-08 LAB — COMPREHENSIVE METABOLIC PANEL
ALT: 69 IU/L — ABNORMAL HIGH (ref 0–32)
AST: 54 IU/L — ABNORMAL HIGH (ref 0–40)
Albumin/Globulin Ratio: 1.6 (ref 1.2–2.2)
Albumin: 4.1 g/dL (ref 3.8–4.9)
Alkaline Phosphatase: 95 IU/L (ref 44–121)
BUN/Creatinine Ratio: 13 (ref 9–23)
BUN: 11 mg/dL (ref 6–24)
Bilirubin Total: 0.2 mg/dL (ref 0.0–1.2)
CO2: 21 mmol/L (ref 20–29)
Calcium: 9 mg/dL (ref 8.7–10.2)
Chloride: 108 mmol/L — ABNORMAL HIGH (ref 96–106)
Creatinine, Ser: 0.86 mg/dL (ref 0.57–1.00)
Globulin, Total: 2.6 g/dL (ref 1.5–4.5)
Glucose: 122 mg/dL — ABNORMAL HIGH (ref 70–99)
Potassium: 4 mmol/L (ref 3.5–5.2)
Sodium: 140 mmol/L (ref 134–144)
Total Protein: 6.7 g/dL (ref 6.0–8.5)
eGFR: 81 mL/min/{1.73_m2} (ref >59)

## 2021-12-08 LAB — LIPID PANEL
Chol/HDL Ratio: 3.8 ratio (ref 0.0–4.4)
Cholesterol, Total: 179 mg/dL (ref 100–199)
HDL: 47 mg/dL (ref >39)
LDL Chol Calc (NIH): 105 mg/dL — ABNORMAL HIGH (ref 0–99)
Triglycerides: 155 mg/dL — ABNORMAL HIGH (ref 0–149)
VLDL Cholesterol Cal: 27 mg/dL (ref 5–40)

## 2021-12-10 ENCOUNTER — Telehealth (HOSPITAL_BASED_OUTPATIENT_CLINIC_OR_DEPARTMENT_OTHER): Payer: Self-pay

## 2021-12-10 ENCOUNTER — Other Ambulatory Visit: Payer: Self-pay

## 2021-12-10 DIAGNOSIS — I10 Essential (primary) hypertension: Secondary | ICD-10-CM

## 2021-12-10 MED ORDER — ROSUVASTATIN CALCIUM 10 MG PO TABS: 10.0000 mg | ORAL_TABLET | Freq: Every day | ORAL | 3 refills | Status: DC

## 2021-12-10 MED ORDER — AMITRIPTYLINE HCL 10 MG PO TABS: 10.0000 mg | ORAL_TABLET | Freq: Every day | ORAL | 0 refills | Status: DC

## 2021-12-10 NOTE — Telephone Encounter (Addendum)
Called results to patient and left results on VM (ok per DPR), instructions left to call office back if patient has any questions!  ? ?Labs ordered and mailed, prescriptions updated and sent to pharmacy on file  ? ? ? ?----- Message from [name], MD sent at 12/09/2021  7:45 AM EDT ----- ?Normal kidney function.  Liver function remains mildly elevated.  Cholesterol levels are going in the wrong direction.  I recommend trying a low dose of a statin, which can actually help fatty liver disease.  Recommend trying rosuvastatin '10mg'$  and repeating lipids/CMP in 2 months. ?

## 2021-12-21 ENCOUNTER — Ambulatory Visit
Admission: RE | Admit: 2021-12-21 | Discharge: 2021-12-21 | Disposition: A | Discharge status: Home / Self Care | Payer: BC Managed Care – PPO | Source: Ambulatory Visit | Attending: General Surgery | Admitting: General Surgery

## 2021-12-21 ENCOUNTER — Ambulatory Visit: Payer: BC Managed Care – PPO

## 2021-12-21 DIAGNOSIS — M79629 Pain in unspecified upper arm: Secondary | ICD-10-CM

## 2021-12-21 DIAGNOSIS — R928 Other abnormal and inconclusive findings on diagnostic imaging of breast: Secondary | ICD-10-CM | POA: Diagnosis not present

## 2022-01-03 ENCOUNTER — Other Ambulatory Visit: Payer: Self-pay | Admitting: Family Medicine

## 2022-01-03 DIAGNOSIS — F419 Anxiety disorder, unspecified: Secondary | ICD-10-CM

## 2022-01-03 DIAGNOSIS — N951 Menopausal and female climacteric states: Secondary | ICD-10-CM

## 2022-01-04 ENCOUNTER — Ambulatory Visit: Payer: BC Managed Care – PPO | Admitting: Family Medicine

## 2022-01-04 ENCOUNTER — Encounter: Payer: Self-pay | Admitting: Family Medicine

## 2022-01-04 VITALS — BP 124/81 | HR 73 | Temp 98.2°F | Ht 64.5 in | Wt 244.0 lb

## 2022-01-04 DIAGNOSIS — F419 Anxiety disorder, unspecified: Secondary | ICD-10-CM

## 2022-01-04 DIAGNOSIS — I10 Essential (primary) hypertension: Secondary | ICD-10-CM | POA: Diagnosis not present

## 2022-01-04 DIAGNOSIS — F32A Depression, unspecified: Secondary | ICD-10-CM

## 2022-01-04 DIAGNOSIS — Z8249 Family history of ischemic heart disease and other diseases of the circulatory system: Secondary | ICD-10-CM | POA: Diagnosis not present

## 2022-01-04 DIAGNOSIS — I2510 Atherosclerotic heart disease of native coronary artery without angina pectoris: Secondary | ICD-10-CM

## 2022-01-04 DIAGNOSIS — N951 Menopausal and female climacteric states: Secondary | ICD-10-CM

## 2022-01-04 DIAGNOSIS — G43809 Other migraine, not intractable, without status migrainosus: Secondary | ICD-10-CM

## 2022-01-04 DIAGNOSIS — G43009 Migraine without aura, not intractable, without status migrainosus: Secondary | ICD-10-CM

## 2022-01-04 MED ORDER — HYDROCHLOROTHIAZIDE 25 MG PO TABS: 25.0000 mg | ORAL_TABLET | Freq: Every day | ORAL | 1 refills | Status: DC

## 2022-01-04 MED ORDER — ONDANSETRON HCL 4 MG PO TABS: 4.0000 mg | ORAL_TABLET | Freq: Three times a day (TID) | ORAL | 5 refills | Status: DC | PRN

## 2022-01-04 MED ORDER — TOPIRAMATE 50 MG PO TABS: 50.0000 mg | ORAL_TABLET | Freq: Every day | ORAL | 1 refills | Status: DC

## 2022-01-04 MED ORDER — AMITRIPTYLINE HCL 10 MG PO TABS: 10.0000 mg | ORAL_TABLET | Freq: Every day | ORAL | 1 refills | Status: DC

## 2022-01-04 MED ORDER — VENLAFAXINE HCL ER 150 MG PO CP24: ORAL_CAPSULE | ORAL | 1 refills | Status: DC

## 2022-01-04 MED ORDER — RIZATRIPTAN BENZOATE 5 MG PO TABS: 5.0000 mg | ORAL_TABLET | ORAL | 5 refills | Status: DC | PRN

## 2022-01-04 MED ORDER — HYDROXYZINE HCL 10 MG PO TABS: 10.0000 mg | ORAL_TABLET | Freq: Two times a day (BID) | ORAL | 5 refills | Status: DC | PRN

## 2022-01-04 NOTE — Patient Instructions (Signed)
No follow-ups on file.        Great to see you today.  I have refilled the medication(s) we provide.   If labs were collected, we will inform you of lab results once received either by echart message or telephone call.   - echart message- for normal results that have been seen by the patient already.   - telephone call: abnormal results or if patient has not viewed results in their echart.  

## 2022-01-04 NOTE — Progress Notes (Signed)
Patient ID: Shannon Quinn, female  DOB: 18-Oct-1970, 51 y.o.   MRN: [id number] Patient Care Team    Relationship Specialty Notifications Start End  Ma Hillock, DO PCP - General Family Medicine  11/03/17   Lady Gary, [facility]    11/03/17   Everlene Farrier, MD Consulting Physician Obstetrics and Gynecology  11/03/17   Jerrell Belfast, MD Consulting Physician Otolaryngology  11/03/17   Alda Berthold, DO Consulting Physician Neurology  11/03/17   Netta Cedars, MD Consulting Physician Orthopedic Surgery  06/01/19   Fanny Skates, MD Consulting Physician General Surgery  06/01/19   Irene Shipper, MD Consulting Physician Gastroenterology  05/23/21   [name], MD Attending Physician Cardiology  05/23/21     Chief Complaint  Patient presents with   Hypertension    [place]; pt is not fasting    Subjective:  Shannon Quinn is a 51 y.o.  Female  present for cmc. All past medical history, surgical history, allergies, family history, immunizations, medications and social history were updated in the electronic medical record today. All recent labs, ED visits and hospitalizations within the last year were reviewed.  Anxiety and depression/Hot flashes due to menopause Pt reports she is feeling well on effexor 75 mg QD- no complaints.     Hypertension/Fhx heart disease Pt reports compliance with HCTZ.  Patient unfortunately encountered myalgias in her hands with use of Crestor low-dose.Patient denies chest pain, shortness of breath, dizziness or lower extremity edema.  Pt does  take  daily baby ASA. Pt was prescribed statin. She and her family have started a mediterranean diet ant home. RF: HTN, obesity, FH heart disease and MI at 61 in father.   Migraine:   Patient reports her migraines have been well controlled  on current regimen.  Migraine free since starting amitriptyline 10 mg qhs. . She did not taper up. She feels it helps her sleep, may cause  a little drowsiness first in the morning. She only uses Topamax when going through headache flares-since it makes her foggy headed.      01/04/2022    3:05 PM 11/15/2021    9:07 AM 05/23/2021   10:04 AM 12/29/2020    2:41 PM 11/17/2020   10:49 AM  Depression screen PHQ 2/9  Decreased Interest '1 2 1 '$ 0 1  Down, Depressed, Hopeless 1 2  0 1  PHQ - 2 Score '2 4 1 '$ 0 2  Altered sleeping 2 3 0  2  Tired, decreased energy '3 2 2  3  '$ Change in appetite 0 1 0  1  Feeling bad or failure about yourself  0 0 1  1  Trouble concentrating '1 2 1  '$ 0  Moving slowly or fidgety/restless '1 2 1  3  '$ Suicidal thoughts 0 0 0  0  PHQ-9 Score '9 14 6  12      '$ 01/04/2022    3:06 PM 05/23/2021   10:05 AM 11/17/2020   10:50 AM 05/30/2020    8:18 AM  GAD 7 : Generalized Anxiety Score  Nervous, Anxious, on Edge '1 2 1 1  '$ Control/stop worrying 1 2 0 0  Worry too much - different things '1 2 1 '$ 0  Trouble relaxing 0 2 1 0  Restless 0 2 0 0  Easily annoyed or irritable 1 1 0 1  Afraid - awful might happen 1 0 1 0  Total GAD 7 Score '5 11 4 2    '$ Immunization  History  Administered Date(s) Administered   Influenza,inj,Quad PF,6+ Mos 04/26/2013, 06/09/2016, 05/24/2019, 05/30/2020, 05/23/2021   Influenza,inj,quad, With Preservative 05/17/2019   PFIZER(Purple Top)SARS-COV-2 Vaccination 02/05/2020, 02/22/2020   Tdap 05/25/2012   Zoster Recombinat (Shingrix) 06/01/2019, 11/05/2019    Past Medical History:  Diagnosis Date   Abnormality of right breast on screening mammogram 10/09/2018   Allergy    Atypical chest pain 06/07/2019   Claudication in peripheral vascular disease ([place]) 07/10/2021   Colon polyps 2011   3 polpys removed   Complication of anesthesia    "severe Reaction" "Physically ill"    Deviated septum    Family history of adverse reaction to anesthesia    violent when waking up from anesthesia - very sick   Hypertension    Hypoglycemic syndrome 1996   during pregnancy   Hypothyroidism    Impingement  syndrome of right shoulder region 06/04/2018   Lymphedema of right arm    Melanoma ([place])    03/10/2018   PONV (postoperative nausea and vomiting)    Shoulder injury    right - side happened from last surgery   Thyroid disease    Varicose vein of leg    Allergies  Allergen Reactions   Chlorhexidine Other (See Comments)    Red and purple burns and peeled skin. Cannot use surgical glue.  Pt states Betadine is safe and without reaction.   Neomycin     UNSPECIFIED REACTION    Other Other (See Comments)    Body glue, turns skin blue   Sulfamethoxazole-Trimethoprim     UNSPECIFIED REACTION    Sulfasalazine Other (See Comments)   Sulfonamide Derivatives     UNSPECIFIED REACTION    Augmentin [Amoxicillin-Pot Clavulanate] Nausea And Vomiting    amox ok.    Hydrocodone Nausea And Vomiting    Severe vomiting   Oxycodone Nausea And Vomiting    Severe vomiting   Prednisone Other (See Comments)    insomnia   Past Surgical History:  Procedure Laterality Date   BREAST LUMPECTOMY WITH RADIOACTIVE SEED LOCALIZATION Right 10/09/2018   Procedure: RIGHT BREAST LUMPECTOMY WITH RADIOACTIVE SEED LOCALIZATION ERAS PATHWAY;  Surgeon: Fanny Skates, MD;  Location: [place];  Service: General;  Laterality: Right;   COLONOSCOPY     polyps   LAPAROSCOPIC TOTAL HYSTERECTOMY  09/04/2017   Dr. Gaetano Net for endometriosis   MELANOMA EXCISION WITH SENTINEL LYMPH NODE BIOPSY Left 04/02/2018   Procedure: INJECT BLUE DYE MELANOMA LEFT BACK WITH  WIDE LOCAL EXCISION MELANOMA LEFT BACK BILATERAL AXILLARY DEEP SENTINEL LYMPH NODE BIOPSY;  Surgeon: Fanny Skates, MD;  Location: [place];  Service: General;  Laterality: Left;   TUBAL LIGATION     WISDOM TOOTH EXTRACTION     Family History  Problem Relation Age of Onset   Arthritis Mother    Dementia Mother 27   Stroke Mother    Alcohol abuse Father    Arthritis Father    Hyperlipidemia Father    Stroke Father        2012    Depression Father    Diabetes Father    Heart disease Father 72   Heart failure Father    Heart attack Father    Diabetes Sister    CAD Maternal Uncle    Colon cancer Maternal Uncle    Cancer Maternal Uncle    CAD Paternal Aunt    Congestive Heart Failure Paternal Aunt    Kidney failure Paternal Aunt    Cancer Paternal Uncle  breast cancer   Colon cancer Paternal Uncle    Breast cancer Maternal Grandmother 60   Dementia Maternal Grandmother    Heart disease Maternal Grandfather 40   CAD Maternal Grandfather    Diabetes Paternal Grandmother    COPD Paternal Grandfather        emphysema   Esophageal cancer Neg Hx    Stomach cancer Neg Hx    Rectal cancer Neg Hx    Social History   Social History Narrative   Regular exercise: walking 1 mile per day   Caffeine use: soda drinker   Lives with husband and 2 children in a one story home.     Works for [employer].  She works in Science writer   Education: associates degree.      Allergies as of 01/04/2022       Reactions   Chlorhexidine Other (See Comments)   Red and purple burns and peeled skin. Cannot use surgical glue.  Pt states Betadine is safe and without reaction.   Neomycin    UNSPECIFIED REACTION    Other Other (See Comments)   Body glue, turns skin blue   Sulfamethoxazole-trimethoprim    UNSPECIFIED REACTION    Sulfasalazine Other (See Comments)   Sulfonamide Derivatives    UNSPECIFIED REACTION    Augmentin [amoxicillin-pot Clavulanate] Nausea And Vomiting   amox ok.    Hydrocodone Nausea And Vomiting   Severe vomiting   Oxycodone Nausea And Vomiting   Severe vomiting   Prednisone Other (See Comments)   insomnia        Medication List        Accurate as of Jan 04, 2022  3:42 PM. If you have any questions, ask your nurse or doctor.          STOP taking these medications    promethazine 25 MG suppository Commonly known as: Phenergan Stopped by: Howard Pouch, DO       TAKE these medications    amitriptyline 10 MG tablet Commonly known as: ELAVIL Take 1 tablet (10 mg total) by mouth at bedtime.   aspirin EC 81 MG tablet Take 1 tablet (81 mg total) by mouth daily.   fluticasone 50 MCG/ACT nasal spray Commonly known as: FLONASE Place 2 sprays into both nostrils daily.   hydrochlorothiazide 25 MG tablet Commonly known as: HYDRODIURIL Take 1 tablet (25 mg total) by mouth daily.   hydrOXYzine 10 MG tablet Commonly known as: ATARAX Take 1-3 tablets (10-30 mg total) by mouth 2 (two) times daily as needed.   Omega-3 1000 MG Caps Take 2,000 mg by mouth.   ondansetron 4 MG tablet Commonly known as: ZOFRAN Take 1-2 tablets (4-8 mg total) by mouth every 8 (eight) hours as needed for nausea or vomiting.   OVER THE COUNTER MEDICATION   rizatriptan 5 MG tablet Commonly known as: Maxalt Take 1 tablet (5 mg total) by mouth as needed for migraine. May repeat in 2 hours if needed   rosuvastatin 10 MG tablet Commonly known as: CRESTOR Take 1 tablet (10 mg total) by mouth daily.   topiramate 50 MG tablet Commonly known as: TOPAMAX Take 1 tablet (50 mg total) by mouth daily.   venlafaxine XR 150 MG 24 hr capsule Commonly known as: EFFEXOR-XR TAKE 1 CAPSULE BY MOUTH EVERY DAY What changed:  medication strength how much to take how to take this when to take this additional instructions Changed by: Howard Pouch, DO  All past medical history, surgical history, allergies, family history, immunizations andmedications were updated in the EMR today and reviewed under the history and medication portions of their EMR.      ROS: 14 pt review of systems performed and negative (unless mentioned in an HPI)  Objective: BP 124/81   Pulse 73   Temp 98.2 F (36.8 C) (Oral)   Ht 5' 4.5" (1.638 m)   Wt 244 lb (110.7 kg)   LMP 08/23/2017   SpO2 97%   BMI 41.24 kg/m  Physical Exam Vitals and nursing note reviewed.   Constitutional:      General: She is not in acute distress.    Appearance: Normal appearance. She is obese. She is not ill-appearing or toxic-appearing.  HENT:     Head: Normocephalic and atraumatic.  Eyes:     General: No scleral icterus.       Right eye: No discharge.        Left eye: No discharge.     Extraocular Movements: Extraocular movements intact.     Conjunctiva/sclera: Conjunctivae normal.     Pupils: Pupils are equal, round, and reactive to light.  Cardiovascular:     Rate and Rhythm: Normal rate and regular rhythm.     Heart sounds: No murmur heard. Pulmonary:     Effort: Pulmonary effort is normal. No respiratory distress.     Breath sounds: Normal breath sounds. No wheezing, rhonchi or rales.  Musculoskeletal:     Right lower leg: No edema.     Left lower leg: No edema.  Skin:    General: Skin is warm and dry.     Coloration: Skin is not jaundiced or pale.     Findings: No erythema or rash.  Neurological:     Mental Status: She is alert and oriented to person, place, and time. Mental status is at baseline.     Motor: No weakness.     Gait: Gait normal.  Psychiatric:        Mood and Affect: Mood normal.        Behavior: Behavior normal.        Thought Content: Thought content normal.        Judgment: Judgment normal.    No results found.  Assessment/plan: Shannon Quinn is a 51 y.o. female present for CPE/cmc Migraine: stable continue amitriptyline 10 mg nightly.     continue Maxalt as needed Continue  Vistaril as needed Continue Topamax if seeing increase in flare she will restart as. continue Zofran 4 mg every 8 hours as needed   Anxiety and depression/Hot flashes due to menopause Increased anxiety Increase effexor 75> 150 mg qd  Hypertension/Fhx heart disease/Family history of heart disease/Coronary arteriosclerosis in native artery/Morbid obesity ([place]):  Stable. Continue hctz 25 Unable to tolerate Crestor. Labs utd  Melanoma of  skin ([place]) Continue to follow with dermatology  Return in about 24 weeks (around 06/21/2022) for cpe (40 min), Routine chronic condition follow-up.  No orders of the defined types were placed in this encounter.   No orders of the defined types were placed in this encounter.  Meds ordered this encounter  Medications   amitriptyline (ELAVIL) 10 MG tablet    Sig: Take 1 tablet (10 mg total) by mouth at bedtime.    Dispense:  90 tablet    Refill:  1   hydrochlorothiazide (HYDRODIURIL) 25 MG tablet    Sig: Take 1 tablet (25 mg total) by mouth daily.    Dispense:  90 tablet    Refill:  1   hydrOXYzine (ATARAX) 10 MG tablet    Sig: Take 1-3 tablets (10-30 mg total) by mouth 2 (two) times daily as needed.    Dispense:  90 tablet    Refill:  5   ondansetron (ZOFRAN) 4 MG tablet    Sig: Take 1-2 tablets (4-8 mg total) by mouth every 8 (eight) hours as needed for nausea or vomiting.    Dispense:  40 tablet    Refill:  5   rizatriptan (MAXALT) 5 MG tablet    Sig: Take 1 tablet (5 mg total) by mouth as needed for migraine. May repeat in 2 hours if needed    Dispense:  10 tablet    Refill:  5   venlafaxine XR (EFFEXOR-XR) 150 MG 24 hr capsule    Sig: TAKE 1 CAPSULE BY MOUTH EVERY DAY    Dispense:  90 capsule    Refill:  1   topiramate (TOPAMAX) 50 MG tablet    Sig: Take 1 tablet (50 mg total) by mouth daily.    Dispense:  90 tablet    Refill:  1   Referral Orders  No referral(s) requested today     Electronically signed by: Howard Pouch, [place]

## 2022-01-17 ENCOUNTER — Other Ambulatory Visit: Payer: Self-pay | Admitting: Lab

## 2022-01-17 ENCOUNTER — Inpatient Hospital Stay: Payer: BC Managed Care – PPO | Attending: Oncology

## 2022-01-17 ENCOUNTER — Other Ambulatory Visit: Payer: Self-pay

## 2022-01-17 ENCOUNTER — Inpatient Hospital Stay: Payer: BC Managed Care – PPO | Admitting: Oncology

## 2022-01-17 VITALS — BP 126/83 | HR 76 | Temp 97.6°F | Resp 18 | Ht 64.5 in | Wt 242.8 lb

## 2022-01-17 DIAGNOSIS — Z08 Encounter for follow-up examination after completed treatment for malignant neoplasm: Secondary | ICD-10-CM | POA: Insufficient documentation

## 2022-01-17 DIAGNOSIS — Z85820 Personal history of malignant melanoma of skin: Secondary | ICD-10-CM | POA: Insufficient documentation

## 2022-01-17 DIAGNOSIS — C439 Malignant melanoma of skin, unspecified: Secondary | ICD-10-CM

## 2022-01-17 LAB — CBC WITH DIFFERENTIAL (CANCER CENTER ONLY)
Abs Immature Granulocytes: 0.02 10*3/uL (ref 0.00–0.07)
Basophils Absolute: 0.1 10*3/uL (ref 0.0–0.1)
Basophils Relative: 2 %
Eosinophils Absolute: 0.2 10*3/uL (ref 0.0–0.5)
Eosinophils Relative: 3 %
HCT: 43.1 % (ref 36.0–46.0)
Hemoglobin: 14.3 g/dL (ref 12.0–15.0)
Immature Granulocytes: 0 %
Lymphocytes Relative: 30 %
Lymphs Abs: 2.1 10*3/uL (ref 0.7–4.0)
MCH: 30 pg (ref 26.0–34.0)
MCHC: 33.2 g/dL (ref 30.0–36.0)
MCV: 90.4 fL (ref 80.0–100.0)
Monocytes Absolute: 0.4 10*3/uL (ref 0.1–1.0)
Monocytes Relative: 6 %
Neutro Abs: 4.3 10*3/uL (ref 1.7–7.7)
Neutrophils Relative %: 59 %
Platelet Count: 284 10*3/uL (ref 150–400)
RBC: 4.77 MIL/uL (ref 3.87–5.11)
RDW: 13.9 % (ref 11.5–15.5)
WBC Count: 7.1 10*3/uL (ref 4.0–10.5)
nRBC: 0 % (ref 0.0–0.2)

## 2022-01-17 LAB — CMP (CANCER CENTER ONLY)
ALT: 91 U/L — ABNORMAL HIGH (ref 0–44)
AST: 60 U/L — ABNORMAL HIGH (ref 15–41)
Albumin: 4.1 g/dL (ref 3.5–5.0)
Alkaline Phosphatase: 83 U/L (ref 38–126)
Anion gap: 8 (ref 5–15)
BUN: 16 mg/dL (ref 6–20)
CO2: 23 mmol/L (ref 22–32)
Calcium: 9.6 mg/dL (ref 8.9–10.3)
Chloride: 109 mmol/L (ref 98–111)
Creatinine: 0.99 mg/dL (ref 0.44–1.00)
GFR, Estimated: >60 mL/min (ref >60)
Glucose, Bld: 108 mg/dL — ABNORMAL HIGH (ref 70–99)
Potassium: 3.9 mmol/L (ref 3.5–5.1)
Sodium: 140 mmol/L (ref 135–145)
Total Bilirubin: 0.4 mg/dL (ref 0.3–1.2)
Total Protein: 7.2 g/dL (ref 6.5–8.1)

## 2022-01-17 NOTE — Progress Notes (Signed)
Hematology and Oncology Follow Up Visit  Shannon Quinn [id number] May 27, 1971 51 y.o. 01/17/2022 8:46 AM Shannon Quinn, Shannon Quinn, Shannon Quinn, Shannon A, DO   Principle Diagnosis: 51 year old woman with melanoma of the upper back diagnosed in 2019.  She was found to have T2aN1 disease with 1 out of 10 lymph node involved.   Prior Therapy: She underwent wide excision with bilateral sentinel lymph node biopsy of the axilla.  The final pathology obtained on April 02, 2018 showed Quinn residual melanoma of 0.8 mm T staging was T2a.  She had bilateral lymph node sentinel lymph node biopsy with 1 out of 10 lymph nodes were positive for melanoma.  Less than 0.1 mm tiny focus of melanoma seen on 1 of those 10 lymph nodes.   Current therapy: Active surveillance.  Interim History: Shannon Quinn returns today for repeat evaluation.  Since last visit, she reports no major changes in her health.  She continues to do well be up-to-date on her dermatology surveillance without any new rashes or lesions.  She denies any worsening pruritus or constitutional symptoms.  Continues to be active and attends to activities of daily living.            Medications: Updated on review.. Current Outpatient Medications  Medication Sig Dispense Refill   amitriptyline (ELAVIL) 10 MG tablet Take 1 tablet (10 mg total) by mouth at bedtime. 90 tablet 1   aspirin EC 81 MG tablet Take 1 tablet (81 mg total) by mouth daily. 90 tablet 3   fluticasone (FLONASE) 50 MCG/ACT nasal spray Place 2 sprays into both nostrils daily. 16 g 11   hydrochlorothiazide (HYDRODIURIL) 25 MG tablet Take 1 tablet (25 mg total) by mouth daily. 90 tablet 1   hydrOXYzine (ATARAX) 10 MG tablet Take 1-3 tablets (10-30 mg total) by mouth 2 (two) times daily as needed. 90 tablet 5   Omega-3 1000 MG CAPS Take 2,000 mg by mouth.     ondansetron (ZOFRAN) 4 MG tablet Take 1-2 tablets (4-8 mg total) by mouth every 8 (eight) hours as needed for nausea or vomiting.  40 tablet 5   OVER THE COUNTER MEDICATION      rizatriptan (MAXALT) 5 MG tablet Take 1 tablet (5 mg total) by mouth as needed for migraine. May repeat in 2 hours if needed 10 tablet 5   rosuvastatin (CRESTOR) 10 MG tablet Take 1 tablet (10 mg total) by mouth daily. 90 tablet 3   topiramate (TOPAMAX) 50 MG tablet Take 1 tablet (50 mg total) by mouth daily. 90 tablet 1   venlafaxine XR (EFFEXOR-XR) 150 MG 24 hr capsule TAKE 1 CAPSULE BY MOUTH EVERY DAY 90 capsule 1   No current facility-administered medications for this visit.     Allergies:  Allergies  Allergen Reactions   Chlorhexidine Other (See Comments)    Red and purple burns and peeled skin. Cannot use surgical glue.  Pt states Betadine is safe and without reaction.   Neomycin     UNSPECIFIED REACTION    Other Other (See Comments)    Body glue, turns skin blue   Sulfamethoxazole-Trimethoprim     UNSPECIFIED REACTION    Sulfasalazine Other (See Comments)   Sulfonamide Derivatives     UNSPECIFIED REACTION    Augmentin [Amoxicillin-Pot Clavulanate] Nausea And Vomiting    amox ok.    Hydrocodone Nausea And Vomiting    Severe vomiting   Oxycodone Nausea And Vomiting    Severe vomiting   Prednisone Other (See Comments)  insomnia        Physical Exam:     Blood pressure 126/83, pulse 76, temperature 97.6 F (36.4 C), temperature source Tympanic, resp. rate 18, height 5' 4.5" (1.638 m), weight 242 lb 12.8 oz (110.1 kg), last menstrual period 08/23/2017, SpO2 96 %.    ECOG: 0   General appearance: Alert, awake without any distress. Head: Atraumatic without abnormalities Oropharynx: Without any thrush or ulcers. Eyes: No scleral icterus. Lymph nodes: No lymphadenopathy noted in the cervical, supraclavicular, or axillary nodes Heart:regular rate and rhythm, without any murmurs or gallops.   Lung: Clear to auscultation without any rhonchi, wheezes or dullness to percussion. Abdomin: Soft, nontender without any  shifting dullness or ascites. Musculoskeletal: No clubbing or cyanosis. Neurological: No motor or sensory deficits. Skin: No rashes or lesions.         Lab Results: Lab Results  Component Value Date   WBC 7.3 07/12/2021   HGB 13.9 07/12/2021   HCT 42.5 07/12/2021   MCV 89.7 07/12/2021   PLT 304 07/12/2021     Chemistry      Component Value Date/Time   NA 140 12/07/2021 1218   K 4.0 12/07/2021 1218   CL 108 (H) 12/07/2021 1218   CO2 21 12/07/2021 1218   BUN 11 12/07/2021 1218   CREATININE 0.86 12/07/2021 1218   CREATININE 1.02 (H) 07/12/2021 1016   CREATININE 0.88 06/01/2019 1556   GLU 87 08/30/2017 0000      Component Value Date/Time   CALCIUM 9.0 12/07/2021 1218   ALKPHOS 95 12/07/2021 1218   AST 54 (H) 12/07/2021 1218   AST 43 (H) 07/12/2021 1016   ALT 69 (H) 12/07/2021 1218   ALT 68 (H) 07/12/2021 1016   BILITOT 0.2 12/07/2021 1218   BILITOT 0.4 07/12/2021 1016           Impression and Plan:  51 year old woman with:   1.  T2a N1Cutaneous melanoma of the upper back diagnosed in 2019.     The natural course of this disease was reviewed and risk of relapse was assessed.  She continues to be without any evidence of disease relapse with imaging studies obtained in December 2022.  At this time I have recommended continued active surveillance and repeat imaging studies in 6 months.   2.  Dermatology surveillance: She is up-to-date continues to follow with dermatology.  3.  Age-appropriate cancer screening: She has completed Quinn mammogram in May 2023 and up-to-date on health maintenance.   4.  Follow-up: She will return in 6 months for repeat evaluation and PET imaging   20  minutes were dedicated to this visit.  The time was spent on reviewing laboratory data, disease status update and discussing future plan of care review.   Zola Button, MD 6/8/20238:46 AM

## 2022-01-23 ENCOUNTER — Other Ambulatory Visit: Payer: Self-pay | Admitting: *Deleted

## 2022-01-23 MED ORDER — LORAZEPAM 1 MG PO TABS: ORAL_TABLET | ORAL | 0 refills | Status: DC

## 2022-01-28 DIAGNOSIS — D224 Melanocytic nevi of scalp and neck: Secondary | ICD-10-CM | POA: Diagnosis not present

## 2022-01-28 DIAGNOSIS — Z85820 Personal history of malignant melanoma of skin: Secondary | ICD-10-CM | POA: Diagnosis not present

## 2022-01-28 DIAGNOSIS — B070 Plantar wart: Secondary | ICD-10-CM | POA: Diagnosis not present

## 2022-01-28 DIAGNOSIS — L821 Other seborrheic keratosis: Secondary | ICD-10-CM | POA: Diagnosis not present

## 2022-01-28 DIAGNOSIS — Z85828 Personal history of other malignant neoplasm of skin: Secondary | ICD-10-CM | POA: Diagnosis not present

## 2022-01-31 ENCOUNTER — Encounter: Payer: Self-pay | Admitting: Surgery

## 2022-01-31 ENCOUNTER — Ambulatory Visit (INDEPENDENT_AMBULATORY_CARE_PROVIDER_SITE_OTHER): Payer: BC Managed Care – PPO | Admitting: Surgery

## 2022-01-31 VITALS — BP 132/76 | HR 66 | Temp 98.4°F | Resp 16 | Ht 64.5 in | Wt 240.0 lb

## 2022-01-31 DIAGNOSIS — I83892 Varicose veins of left lower extremities with other complications: Secondary | ICD-10-CM | POA: Diagnosis not present

## 2022-01-31 HISTORY — PX: ENDOVENOUS ABLATION SAPHENOUS VEIN W/ LASER: SUR449

## 2022-01-31 NOTE — Progress Notes (Unsigned)
     Laser Ablation Procedure    Date: 01/31/2022   Shannon Quinn DOB:1971-06-15  Consent signed: Yes      Surgeon: Harold Barban MD   Procedure: Laser Ablation: left Greater Saphenous Vein  BP 132/76 (BP Location: Right Arm, Patient Position: Sitting, Cuff Size: Large)   Pulse 66   Temp 98.4 F (36.9 C) (Temporal)   Resp 16   Ht 5' 4.5" (1.638 m)   Wt 240 lb (108.9 kg)   LMP 08/23/2017   SpO2 99%   BMI 40.56 kg/m   Tumescent Anesthesia: 475 cc 0.9% NaCl with 50 cc Lidocaine HCL 1%  and 15 cc 8.4% NaHCO3  Local Anesthesia: 3 cc Lidocaine HCL and NaHCO3 (ratio 2:1)  7 watts continuous mode     Total energy: 1813 Joules    Total time: 259 seconds  Treatment Length  36 cm   Laser Fiber Ref. #  [id number]    Lot # [id number]   Stab Phlebectomy: <10 Sites: Thigh  (knee)  Patient tolerated procedure well  Notes:  All staff members wore facial masks.  [name] took Ativan 1 mg at 9:30 AM and at 11:25 AM on 01-31-2022.    Description of Procedure:  After marking the course of the secondary varicosities, the patient was placed on the operating table in the supine position, and the left leg was prepped and draped in sterile fashion.   Local anesthetic was administered and under ultrasound guidance the saphenous vein was accessed with a micro needle and guide wire; then the mirco puncture sheath was placed.  A guide wire was inserted saphenofemoral junction , followed by a 5 french sheath.  The position of the sheath and then the laser fiber below the junction was confirmed using the ultrasound.  Tumescent anesthesia was administered along the course of the saphenous vein using ultrasound guidance. The patient was placed in Trendelenburg position and protective laser glasses were placed on patient and staff, and the laser was fired at 7 watts continuous mode for a total of 1813 joules.   For stab phlebectomies, local anesthetic was administered at the previously marked  varicosities, and tumescent anesthesia was administered around the vessels.  Less than ten stab wounds were made using the tip of an 11 blade. And using the vein hook, the phlebectomies were performed using a hemostat to avulse the varicosities.  Adequate hemostasis was achieved.     Steri strips were applied to the stab wounds and ABD pads and thigh high compression stockings were applied.  Ace wrap bandages were applied over the phlebectomy sites and at the top of the saphenofemoral junction. Blood loss was less than 15 cc.  Discharge instructions reviewed with patient and hardcopy of discharge instructions given to patient to take home. The patient ambulated out of the operating room having tolerated the procedure well.

## 2022-02-14 ENCOUNTER — Ambulatory Visit (INDEPENDENT_AMBULATORY_CARE_PROVIDER_SITE_OTHER): Payer: BC Managed Care – PPO | Admitting: Surgery

## 2022-02-14 ENCOUNTER — Encounter: Payer: Self-pay | Admitting: Surgery

## 2022-02-14 ENCOUNTER — Ambulatory Visit (HOSPITAL_COMMUNITY)
Admission: RE | Admit: 2022-02-14 | Discharge: 2022-02-14 | Disposition: A | Discharge status: Home / Self Care | Payer: BC Managed Care – PPO | Source: Ambulatory Visit | Attending: Surgery | Admitting: Surgery

## 2022-02-14 VITALS — BP 109/84 | HR 78 | Temp 97.7°F | Resp 18 | Ht 64.0 in | Wt 242.0 lb

## 2022-02-14 DIAGNOSIS — I83892 Varicose veins of left lower extremities with other complications: Secondary | ICD-10-CM | POA: Diagnosis not present

## 2022-02-14 DIAGNOSIS — D1721 Benign lipomatous neoplasm of skin and subcutaneous tissue of right arm: Secondary | ICD-10-CM | POA: Diagnosis not present

## 2022-02-14 DIAGNOSIS — D485 Neoplasm of uncertain behavior of skin: Secondary | ICD-10-CM | POA: Diagnosis not present

## 2022-02-14 DIAGNOSIS — B070 Plantar wart: Secondary | ICD-10-CM | POA: Diagnosis not present

## 2022-02-14 NOTE — Progress Notes (Signed)
Vascular and Vein Specialist of [facility]  Patient name: Shannon Quinn MRN: [id number] DOB: 1970-10-20 Sex: female   REASON FOR VISIT:    Follow up  HISOTRY OF PRESENT ILLNESS:    Shannon Quinn is a 51 y.o. female turns today for follow-up.  She underwent endovenous laser ablation of the left saphenous vein with less than 10 stabs.  She is back today for follow-up.  She states that she notices a big difference now in her left leg.  She no longer has any swelling.  She is not having any discomfort at this time.   PAST MEDICAL HISTORY:   Past Medical History:  Diagnosis Date   Abnormality of right breast on screening mammogram 10/09/2018   Allergy    Atypical chest pain 06/07/2019   Claudication in peripheral vascular disease ([place]) 07/10/2021   Colon polyps 2011   3 polpys removed   Complication of anesthesia    "severe Reaction" "Physically ill"    Deviated septum    Family history of adverse reaction to anesthesia    violent when waking up from anesthesia - very sick   Hypertension    Hypoglycemic syndrome 1996   during pregnancy   Hypothyroidism    Impingement syndrome of right shoulder region 06/04/2018   Lymphedema of right arm    Melanoma ([place])    03/10/2018   PONV (postoperative nausea and vomiting)    Shoulder injury    right - side happened from last surgery   Thyroid disease    Varicose vein of leg      FAMILY HISTORY:   Family History  Problem Relation Age of Onset   Arthritis Mother    Dementia Mother 26   Stroke Mother    Alcohol abuse Father    Arthritis Father    Hyperlipidemia Father    Stroke Father        2012   Depression Father    Diabetes Father    Heart disease Father 72   Heart failure Father    Heart attack Father    Diabetes Sister    CAD Maternal Uncle    Colon cancer Maternal Uncle    Cancer Maternal Uncle    CAD Paternal Aunt    Congestive Heart Failure Paternal Aunt     Kidney failure Paternal Aunt    Cancer Paternal Uncle        breast cancer   Colon cancer Paternal Uncle    Breast cancer Maternal Grandmother 40   Dementia Maternal Grandmother    Heart disease Maternal Grandfather 66   CAD Maternal Grandfather    Diabetes Paternal Grandmother    COPD Paternal Grandfather        emphysema   Esophageal cancer Neg Hx    Stomach cancer Neg Hx    Rectal cancer Neg Hx     SOCIAL HISTORY:   Social History   Tobacco Use   Smoking status: Never   Smokeless tobacco: Never  Substance Use Topics   Alcohol use: Yes    Alcohol/week: 0.0 standard drinks of alcohol    Comment: rarely     ALLERGIES:   Allergies  Allergen Reactions   Chlorhexidine Other (See Comments)    Red and purple burns and peeled skin. Cannot use surgical glue.  Pt states Betadine is safe and without reaction.   Neomycin     UNSPECIFIED REACTION    Other Other (See Comments)    Body glue, turns skin blue  Sulfamethoxazole-Trimethoprim     UNSPECIFIED REACTION    Sulfasalazine Other (See Comments)   Sulfonamide Derivatives     UNSPECIFIED REACTION    Augmentin [Amoxicillin-Pot Clavulanate] Nausea And Vomiting    amox ok.    Hydrocodone Nausea And Vomiting    Severe vomiting   Oxycodone Nausea And Vomiting    Severe vomiting   Prednisone Other (See Comments)    insomnia     CURRENT MEDICATIONS:   Current Outpatient Medications  Medication Sig Dispense Refill   amitriptyline (ELAVIL) 10 MG tablet Take 1 tablet (10 mg total) by mouth at bedtime. 90 tablet 1   aspirin EC 81 MG tablet Take 1 tablet (81 mg total) by mouth daily. 90 tablet 3   fluticasone (FLONASE) 50 MCG/ACT nasal spray Place 2 sprays into both nostrils daily. 16 g 11   hydrochlorothiazide (HYDRODIURIL) 25 MG tablet Take 1 tablet (25 mg total) by mouth daily. 90 tablet 1   hydrOXYzine (ATARAX) 10 MG tablet Take 1-3 tablets (10-30 mg total) by mouth 2 (two) times daily as needed. 90 tablet 5    LORazepam (ATIVAN) 1 MG tablet Take 1 tablet 30 minutes prior to leaving house on day of office surgery.  Bring second tablet with you to office on day of office surgery. 2 tablet 0   Omega-3 1000 MG CAPS Take 2,000 mg by mouth.     ondansetron (ZOFRAN) 4 MG tablet Take 1-2 tablets (4-8 mg total) by mouth every 8 (eight) hours as needed for nausea or vomiting. 40 tablet 5   OVER THE COUNTER MEDICATION      rizatriptan (MAXALT) 5 MG tablet Take 1 tablet (5 mg total) by mouth as needed for migraine. May repeat in 2 hours if needed 10 tablet 5   rosuvastatin (CRESTOR) 10 MG tablet Take 1 tablet (10 mg total) by mouth daily. 90 tablet 3   topiramate (TOPAMAX) 50 MG tablet Take 1 tablet (50 mg total) by mouth daily. 90 tablet 1   venlafaxine XR (EFFEXOR-XR) 150 MG 24 hr capsule TAKE 1 CAPSULE BY MOUTH EVERY DAY 90 capsule 1   No current facility-administered medications for this visit.    REVIEW OF SYSTEMS:   '[X]'$  denotes positive finding, '[ ]'$  denotes negative finding Cardiac  Comments:  Chest pain or chest pressure:    Shortness of breath upon exertion:    Short of breath when lying flat:    Irregular heart rhythm:        Vascular    Pain in calf, thigh, or hip brought on by ambulation:    Pain in feet at night that wakes you up from your sleep:     Blood clot in your veins:    Leg swelling:         Pulmonary    Oxygen at home:    Productive cough:     Wheezing:         Neurologic    Sudden weakness in arms or legs:     Sudden numbness in arms or legs:     Sudden onset of difficulty speaking or slurred speech:    Temporary loss of vision in one eye:     Problems with dizziness:         Gastrointestinal    Blood in stool:     Vomited blood:         Genitourinary    Burning when urinating:     Blood in urine:  Psychiatric    Major depression:         Hematologic    Bleeding problems:    Problems with blood clotting too easily:        Skin    Rashes or ulcers:         Constitutional    Fever or chills:      PHYSICAL EXAM:   Vitals:   02/14/22 1106  BP: 109/84  Pulse: 78  Resp: 18  Temp: 97.7 F (36.5 C)  TempSrc: Temporal  SpO2: 96%  Weight: 242 lb (109.8 kg)  Height: '5\' 4"'$  (1.626 m)    GENERAL: The patient is a well-nourished female, in no acute distress. The vital signs are documented above. CARDIAC: There is a regular rate and rhythm.  VASCULAR: Incisions have all healed nicely.  No significant leg swelling SKIN: There are no ulcers or rashes noted. PSYCHIATRIC: The patient has a normal affect.  STUDIES:   I have reviewed her ultrasound with the following findings: Left:  - No evidence of deep vein thrombosis seen in the left lower extremity,  from the common femoral through the popliteal veins.  - Complete ablation of the great saphenous vein from the knee to within  1.99 cm of the sapheno-femoral junction.     MEDICAL ISSUES:   Status post endovenous ablation of the left saphenous vein.  The patient is very satisfied with her outcome so far.  Her swelling has significantly improved.  Her incisions are healing nicely.  I discussed with her that she does have reflux in the right leg but it is not as bad.  I would recommend holding off for now.  She does not want anything done for at least another year.  She will contact me if she has any desires to address the right leg.  She may benefit from a repeat ultrasound if that is necessary.    Leia Alf, MD, FACS [facility] Specialists of [facility] [phone] Pager [phone]

## 2022-02-28 ENCOUNTER — Emergency Department (HOSPITAL_COMMUNITY)
Admission: EM | Admit: 2022-02-28 | Discharge: 2022-02-28 | Disposition: A | Discharge status: Home / Self Care | Payer: BC Managed Care – PPO | Attending: Emergency Medicine | Admitting: Emergency Medicine

## 2022-02-28 ENCOUNTER — Emergency Department (HOSPITAL_COMMUNITY): Payer: BC Managed Care – PPO

## 2022-02-28 ENCOUNTER — Other Ambulatory Visit: Payer: Self-pay

## 2022-02-28 ENCOUNTER — Encounter (HOSPITAL_COMMUNITY): Payer: Self-pay

## 2022-02-28 DIAGNOSIS — E039 Hypothyroidism, unspecified: Secondary | ICD-10-CM | POA: Insufficient documentation

## 2022-02-28 DIAGNOSIS — R7989 Other specified abnormal findings of blood chemistry: Secondary | ICD-10-CM | POA: Insufficient documentation

## 2022-02-28 DIAGNOSIS — I10 Essential (primary) hypertension: Secondary | ICD-10-CM | POA: Diagnosis not present

## 2022-02-28 DIAGNOSIS — Z7982 Long term (current) use of aspirin: Secondary | ICD-10-CM | POA: Insufficient documentation

## 2022-02-28 DIAGNOSIS — R0602 Shortness of breath: Secondary | ICD-10-CM | POA: Diagnosis not present

## 2022-02-28 DIAGNOSIS — R0789 Other chest pain: Secondary | ICD-10-CM | POA: Diagnosis not present

## 2022-02-28 DIAGNOSIS — R1084 Generalized abdominal pain: Secondary | ICD-10-CM | POA: Diagnosis not present

## 2022-02-28 DIAGNOSIS — R079 Chest pain, unspecified: Secondary | ICD-10-CM | POA: Diagnosis not present

## 2022-02-28 DIAGNOSIS — I959 Hypotension, unspecified: Secondary | ICD-10-CM | POA: Diagnosis not present

## 2022-02-28 LAB — BASIC METABOLIC PANEL
Anion gap: 6 (ref 5–15)
BUN: 13 mg/dL (ref 6–20)
CO2: 27 mmol/L (ref 22–32)
Calcium: 8.8 mg/dL — ABNORMAL LOW (ref 8.9–10.3)
Chloride: 108 mmol/L (ref 98–111)
Creatinine, Ser: 0.82 mg/dL (ref 0.44–1.00)
GFR, Estimated: >60 mL/min (ref >60)
Glucose, Bld: 109 mg/dL — ABNORMAL HIGH (ref 70–99)
Potassium: 3.8 mmol/L (ref 3.5–5.1)
Sodium: 141 mmol/L (ref 135–145)

## 2022-02-28 LAB — CBC
HCT: 43.4 % (ref 36.0–46.0)
Hemoglobin: 13.9 g/dL (ref 12.0–15.0)
MCH: 30.2 pg (ref 26.0–34.0)
MCHC: 32 g/dL (ref 30.0–36.0)
MCV: 94.3 fL (ref 80.0–100.0)
Platelets: 253 10*3/uL (ref 150–400)
RBC: 4.6 MIL/uL (ref 3.87–5.11)
RDW: 14 % (ref 11.5–15.5)
WBC: 6.9 10*3/uL (ref 4.0–10.5)
nRBC: 0 % (ref 0.0–0.2)

## 2022-02-28 LAB — TROPONIN I (HIGH SENSITIVITY)
Troponin I (High Sensitivity): <2 ng/L (ref <18)
Troponin I (High Sensitivity): <2 ng/L (ref <18)

## 2022-02-28 MED ORDER — ACETAMINOPHEN 500 MG PO TABS
1000.0000 mg | ORAL_TABLET | Freq: Four times a day (QID) | ORAL | Status: DC | PRN
  Administered 2022-02-28: 1000 mg via ORAL
  Filled 2022-02-28: qty 2

## 2022-02-28 NOTE — Discharge Instructions (Signed)
Please return to the ED with any new symptoms such as chest pain, shortness of breath Please follow-up with your cardiologist in the next 3 to 5 days Please read the attached guide concerning nonspecific chest pain in adults

## 2022-02-28 NOTE — ED Provider Notes (Signed)
[facility] EMERGENCY DEPARTMENT Provider Note   CSN: [id number] Arrival date & time: 02/28/22  1615     History  Chief Complaint  Patient presents with   Chest Pain    Shannon Quinn is a 51 y.o. female with medical history of varicose veins, postop nausea vomiting, hypothyroid, hypertension.  The patient presents to ED for evaluation of chest pain.  Patient reports that she was at home working at her workstation.  Patient states that she had a sudden onset of centralized chest pain that radiated up her chest and into her neck.  The patient states that this lasted for about 30 to 45 minutes.  The patient states that she called her PCPs office who advised her to call EMS.  The patient reports that she called EMS who then transported her to the ED.  The patient states that this chest pain is no longer present on examination.  Patient states that she did have associated shortness of breath with this chest pain and it did radiate into her jaw.  The patient denies any nausea or vomiting, fevers at home, leg swelling, abdominal pain, back pain.   Chest Pain Associated symptoms: shortness of breath   Associated symptoms: no abdominal pain, no back pain, no fever, no nausea and no vomiting        Home Medications Prior to Admission medications   Medication Sig Start Date End Date Taking? Authorizing Provider  amitriptyline (ELAVIL) 10 MG tablet Take 1 tablet (10 mg total) by mouth at bedtime. Patient taking differently: Take 10 mg by mouth at bedtime. Only on weekends 01/04/22  Yes Kuneff, [name], DO  aspirin EC 81 MG tablet Take 1 tablet (81 mg total) by mouth daily. 09/27/19  Yes [name], MD  aspirin-acetaminophen-caffeine [facility] MIGRAINE) [phone] MG tablet Take 1-2 tablets by mouth every 6 (six) hours as needed for headache or migraine.   Yes [provider]  fluticasone (FLONASE) 50 MCG/ACT nasal spray Place 2 sprays into both nostrils daily. Patient taking  differently: Place 1 spray into both nostrils daily. 11/15/21  Yes Kuneff, [name], DO  gabapentin (NEURONTIN) 100 MG capsule Take 100 mg by mouth 3 (three) times daily. 01/30/22  Yes [provider]  hydrochlorothiazide (HYDRODIURIL) 25 MG tablet Take 1 tablet (25 mg total) by mouth daily. 01/04/22  Yes Kuneff, [name], DO  hydrOXYzine (ATARAX) 10 MG tablet Take 1-3 tablets (10-30 mg total) by mouth 2 (two) times daily as needed. 01/04/22  Yes Kuneff, [name], DO  Omega-3 1000 MG CAPS Take 2,000 mg by mouth.   Yes [provider]  ondansetron (ZOFRAN) 4 MG tablet Take 1-2 tablets (4-8 mg total) by mouth every 8 (eight) hours as needed for nausea or vomiting. 01/04/22  Yes Kuneff, [name], DO  OVER THE COUNTER MEDICATION Allergy medication   Yes [provider]  rizatriptan (MAXALT) 5 MG tablet Take 1 tablet (5 mg total) by mouth as needed for migraine. May repeat in 2 hours if needed 01/04/22  Yes Kuneff, [name], DO  topiramate (TOPAMAX) 50 MG tablet Take 1 tablet (50 mg total) by mouth daily. 01/04/22  Yes Kuneff, [name], DO  venlafaxine XR (EFFEXOR-XR) 150 MG 24 hr capsule TAKE 1 CAPSULE BY MOUTH EVERY DAY 01/04/22  Yes Kuneff, [name], DO  rosuvastatin (CRESTOR) 10 MG tablet Take 1 tablet (10 mg total) by mouth daily. Patient not taking: Reported on 02/28/2022 12/10/21 12/05/22  [name], MD  Allergies    Chlorhexidine, Neomycin, Other, Sulfamethoxazole-trimethoprim, Sulfasalazine, Sulfonamide derivatives, Augmentin [amoxicillin-pot clavulanate], Hydrocodone, Oxycodone, and Prednisone    Review of Systems   Review of Systems  Constitutional:  Negative for fever.  Respiratory:  Positive for shortness of breath.   Cardiovascular:  Positive for chest pain. Negative for leg swelling.  Gastrointestinal:  Negative for abdominal pain, nausea and vomiting.  Musculoskeletal:  Negative for back pain.  All other systems reviewed and are negative.   Physical  Exam Updated Vital Signs BP (!) 114/54   Pulse 62   Temp 97.8 F (36.6 C) (Oral)   Resp 13   Ht '5\' 4"'$  (1.626 m)   Wt 108.9 kg   LMP 08/23/2017   SpO2 100%   BMI 41.20 kg/m  Physical Exam Vitals and nursing note reviewed.  Constitutional:      General: She is not in acute distress.    Appearance: She is well-developed. She is not ill-appearing, toxic-appearing or diaphoretic.  HENT:     Head: Normocephalic and atraumatic.  Neck:     Vascular: No JVD.  Cardiovascular:     Rate and Rhythm: Normal rate and regular rhythm.  Pulmonary:     Effort: Pulmonary effort is normal.     Breath sounds: Normal breath sounds. No decreased breath sounds, wheezing or rhonchi.  Chest:     Chest wall: No mass or tenderness.  Abdominal:     Palpations: Abdomen is soft. There is no mass.     Tenderness: There is no abdominal tenderness. There is no guarding.  Musculoskeletal:     Cervical back: Normal range of motion and neck supple.     Right lower leg: No tenderness. No edema.     Left lower leg: No tenderness. No edema.  Skin:    General: Skin is warm and dry.     Capillary Refill: Capillary refill takes less than 2 seconds.  Neurological:     Mental Status: She is alert and oriented to person, place, and time.     ED Results / Procedures / Treatments   Labs (all labs ordered are listed, but only abnormal results are displayed) Labs Reviewed  BASIC METABOLIC PANEL - Abnormal; Notable for the following components:      Result Value   Glucose, Bld 109 (*)    Calcium 8.8 (*)    All other components within normal limits  CBC  TROPONIN I (HIGH SENSITIVITY)  TROPONIN I (HIGH SENSITIVITY)    EKG None  Radiology DG Chest Port 1 View  Result Date: 02/28/2022 CLINICAL DATA:  Chest pain. EXAM: PORTABLE CHEST 1 VIEW COMPARISON:  Chest x-ray 07/20/2019. FINDINGS: Similar low lung volumes. No consolidation. No visible pleural effusions or pneumothorax. Mildly accentuated  cardiomediastinal silhouette due to AP technique and low lung volumes. No acute osseous abnormality. Right axillary region surgical clips. IMPRESSION: No evidence of acute cardiopulmonary disease. Electronically Signed   By: Margaretha Sheffield M.D.   On: 02/28/2022 16:59    Procedures Procedures   Medications Ordered in ED Medications  acetaminophen (TYLENOL) tablet 1,000 mg (1,000 mg Oral Given 02/28/22 1759)    ED Course/ Medical Decision Making/ A&P                           Medical Decision Making Amount and/or Complexity of Data Reviewed Labs: ordered. Radiology: ordered.  Risk OTC drugs.   51 year old female presents to the ED for evaluation.  Please see  HPI for further details.  On examination, the patient is afebrile and nontachycardic.  The patient lung sounds are clear bilaterally, she is not hypoxic on room air.  The patient abdomen is soft and compressible in all 4 quadrants.  There is no bilateral edema to her lower extremities.  The patient is nontoxic in appearance.  Patient worked up utilizing the following labs and imaging studies interpreted by me personally: - Troponin less than 2, x2 - BMP elevated glucose of 109 noncontributory to symptoms - CBC unremarkable - Chest x-ray shows no consolidation, effusion, mediastinal widening, cardiomegaly.  I agree with radiologist interpretation - EKG nonischemic, normal sinus rhythm.  At this time, patient will be discharged home and advised to follow-up with her cardiologist in the next 5 to 7 days.  The patient has been given return precautions and she has voiced understanding of these.  The patient had all of her questions answered to her satisfaction.  The patient is stable at this time for discharge home.   Final Clinical Impression(s) / ED Diagnoses Final diagnoses:  Chest pain, unspecified type    Rx / DC Orders ED Discharge Orders     None         Lawana Chambers 02/28/22 1957    Luna Fuse, MD 03/05/22 1616

## 2022-02-28 NOTE — ED Triage Notes (Signed)
Patient brought in by [facility] for upper abdominal pain that radiated up into her jaw.  Describes as a tightness.  Patient states that it occurred while working on the computer at home.  Had some shortness of breath.  Patient is conscious and alert at triage.

## 2022-02-28 NOTE — ED Notes (Signed)
Signature pad not working in room.  Patient attempted to sign.

## 2022-03-04 NOTE — Progress Notes (Unsigned)
Cardiology Office Note:    Date:  03/05/2022   ID:  Shannon Quinn, DOB 1970/09/27, MRN [id number]  PCP:  Ma Hillock, DO   [place] Providers Cardiologist:  None     Referring MD: Ma Hillock, DO    CC: Dyspnea on exertion and here for f/u from recent ED visit  History of Present Illness:    Shannon Quinn is a 51 y.o. female with a hx of the following:   Hypertension Non-obstructive CAD Hyperlipidemia Varicose veins OSA on CPAP Family hx of heart disease Peripheral vascular disease with claudication Anxiety Depression Migraines  Her previous cardiac cardiac work-up includes a coronary CTA on December 2020 for atypical chest pain that revealed minimal plaque in the LAD.  Calcium score was 75 this was 95% percentile for age and gender. She was started on a statin.  2D echo performed on July 2022 was done for an episode of syncope due to vomiting and dehydration.  LVEF was 60 to 65% and otherwise normal findings.  She was last seen by Dr. Oval Linsey on July 10, 2021.  She reported some swelling in her ankles after a recent trip to the beach.  It was felt that her LE edema seemed to be due to venous insufficiency as well as she had some varicose veins.  She did admit to some pain in her calves; however, it was difficult to determine if the leg pain she reported having was coming from claudication or her back.  ABI/Doppler was ordered and revealed normal blood flow to both legs with no evidence of an acute DVT bilaterally.  Most recently, she presented to [facility] ED on February 28, 2022 with a chief complaint of chest pain.  She reported that while she was at home working at her workstation, she had a sudden onset of centralized chest pain that radiated up her chest and into her neck, lasted 30 to 45 minutes in duration, and chest pain was no longer present on examination when she arrived to the ED.  Chest pain did not radiate, she did say that she had some associated  shortness of breath with this.  She denied any fevers, leg swelling, nausea, vomiting, abdominal pain, or back pain.  Twelve-lead EKG revealed sinus rhythm without any ST or T wave changes.  Portable chest x-ray did not reveal any acute cardiopulmonary disease. Glucose 109. BNP was WNL except for calcium at 8.8.  CBC and troponins X 2 were unremarkable.  Her physical exam was unremarkable.  She was discharged home the same day and advised to follow-up with her cardiologist within the next week.  Today she is here for follow-up from her recent hospital visit for chest pain.  Has overall been doing well from a cardiac perspective however she does states she has DOE and steady weight gain. In the past 2 weeks, her weight is up 3 lbs from her last visit on 02/13/2022. Denies any chest pain since her hospital stay, syncope, presyncope, and bleeding. Claudication has improved in her bilateral legs. Only has dizziness when changing positions or with watching games or something online. Denies vertigo. Does admit to sleeping at an incline d/t hx of OSA and her husband has witnessed her stop breathing at night. Does not have a CPAP machine currently and needs to reach out to the CPAP company to get this set up for her. Reports checking BP at home with an apple watch equivalent. Has stopped taking Crestor as she says this  caused her swelling in her hands and she has not been able to tolerate this. Says she has been on another statin before and had a similar response. Denies any other acute concerns or complaints today.    Past Medical History:  Diagnosis Date   Abnormality of right breast on screening mammogram 10/09/2018   Allergy    Atypical chest pain 06/07/2019   Claudication in peripheral vascular disease ([place]) 07/10/2021   Colon polyps 2011   3 polpys removed   Complication of anesthesia    "severe Reaction" "Physically ill"    Deviated septum    Family history of adverse reaction to anesthesia    violent  when waking up from anesthesia - very sick   Hypertension    Hypoglycemic syndrome 1996   during pregnancy   Hypothyroidism    Impingement syndrome of right shoulder region 06/04/2018   Lymphedema of right arm    Melanoma ([place])    03/10/2018   PONV (postoperative nausea and vomiting)    Shoulder injury    right - side happened from last surgery   Thyroid disease    Varicose vein of leg     Past Surgical History:  Procedure Laterality Date   BREAST LUMPECTOMY WITH RADIOACTIVE SEED LOCALIZATION Right 10/09/2018   Procedure: RIGHT BREAST LUMPECTOMY [place];  Surgeon: Fanny Skates, MD;  Location: [facility];  Service: General;  Laterality: Right;   COLONOSCOPY     polyps   ENDOVENOUS ABLATION SAPHENOUS VEIN W/ LASER Left 01/31/2022   endovenous laser ablation left greater saphenous vein and stab phlebectomy<10 incisions left leg by Harold Barban MD   LAPAROSCOPIC TOTAL HYSTERECTOMY  09/04/2017   Dr. Gaetano Net for endometriosis   MELANOMA EXCISION WITH SENTINEL LYMPH NODE BIOPSY Left 04/02/2018   Procedure: INJECT BLUE DYE MELANOMA LEFT BACK WITH  WIDE LOCAL EXCISION MELANOMA LEFT BACK BILATERAL AXILLARY DEEP SENTINEL LYMPH NODE BIOPSY;  Surgeon: Fanny Skates, MD;  Location: [facility];  Service: General;  Laterality: Left;   TUBAL LIGATION     WISDOM TOOTH EXTRACTION      Current Medications: Current Meds  Medication Sig   amitriptyline (ELAVIL) 10 MG tablet Take 1 tablet (10 mg total) by mouth at bedtime. (Patient taking differently: Take 10 mg by mouth at bedtime. Only on weekends)   aspirin EC 81 MG tablet Take 1 tablet (81 mg total) by mouth daily.   aspirin-acetaminophen-caffeine (EXCEDRIN MIGRAINE) 250-250-65 MG tablet Take 1-2 tablets by mouth every 6 (six) hours as needed for headache or migraine.   ezetimibe (ZETIA) 10 MG tablet Take 1 tablet (10 mg total) by mouth daily.   fluticasone (FLONASE) 50  MCG/ACT nasal spray Place 2 sprays into both nostrils daily. (Patient taking differently: Place 1 spray into both nostrils daily.)   gabapentin (NEURONTIN) 100 MG capsule Take 100 mg by mouth 3 (three) times daily.   hydrochlorothiazide (HYDRODIURIL) 25 MG tablet Take 1 tablet (25 mg total) by mouth daily.   hydrOXYzine (ATARAX) 10 MG tablet Take 1-3 tablets (10-30 mg total) by mouth 2 (two) times daily as needed.   Omega-3 1000 MG CAPS Take 2,000 mg by mouth.   ondansetron (ZOFRAN) 4 MG tablet Take 1-2 tablets (4-8 mg total) by mouth every 8 (eight) hours as needed for nausea or vomiting.   OVER THE COUNTER MEDICATION Allergy medication   rizatriptan (MAXALT) 5 MG tablet Take 1 tablet (5 mg total) by mouth as needed for migraine.  May repeat in 2 hours if needed   topiramate (TOPAMAX) 50 MG tablet Take 1 tablet (50 mg total) by mouth daily.   venlafaxine XR (EFFEXOR-XR) 150 MG 24 hr capsule TAKE 1 CAPSULE BY MOUTH EVERY DAY   [DISCONTINUED] rosuvastatin (CRESTOR) 10 MG tablet Take 1 tablet (10 mg total) by mouth daily.     Allergies:   Chlorhexidine, Neomycin, Other, Sulfamethoxazole-trimethoprim, Sulfasalazine, Sulfonamide derivatives, Augmentin [amoxicillin-pot clavulanate], Hydrocodone, Oxycodone, and Prednisone   Social History   Socioeconomic History   Marital status: Married    Spouse name: Not on file   Number of children: 2   Years of education: Not on file   Highest education level: Not on file  Occupational History   Not on file  Tobacco Use   Smoking status: Never   Smokeless tobacco: Never  Vaping Use   Vaping Use: Never used  Substance and Sexual Activity   Alcohol use: Yes    Alcohol/week: 0.0 standard drinks of alcohol    Comment: rarely   Drug use: No   Sexual activity: Yes    Partners: Male    Birth control/protection: Surgical  Other Topics Concern   Not on file  Social History Narrative   Regular exercise: walking 1 mile per day   Caffeine use: soda  drinker   Lives with husband and 2 children in a one story home.     Works for [employer].  She works in Science writer   Education: associates degree.     Social Determinants of Health   Financial Resource Strain: Not on file  Food Insecurity: Not on file  Transportation Needs: Not on file  Physical Activity: Not on file  Stress: Not on file  Social Connections: Not on file     Family History: The patient's family history includes Alcohol abuse in her father; Arthritis in her father and mother; Breast cancer (age of onset: 69) in her maternal grandmother; CAD in her maternal grandfather, maternal uncle, and paternal aunt; COPD in her paternal grandfather; Cancer in her maternal uncle and paternal uncle; Colon cancer in her maternal uncle and paternal uncle; Congestive Heart Failure in her paternal aunt; Dementia in her maternal grandmother; Dementia (age of onset: 81) in her mother; Depression in her father; Diabetes in her father, paternal grandmother, and sister; Heart attack in her father; Heart disease (age of onset: 93) in her maternal grandfather; Heart disease (age of onset: 57) in her father; Heart failure in her father; Hyperlipidemia in her father; Kidney failure in her paternal aunt; Stroke in her father and mother. There is no history of Esophageal cancer, Stomach cancer, or Rectal cancer. Significant cardiovascular family history including cardiovascular disease, coronary artery disease, type 2 diabetes, and coronary artery bypass graft.   ROS:   Review of Systems  Constitutional:  Negative for chills, diaphoresis, fever, malaise/fatigue and weight loss.  HENT: Negative.    Eyes: Negative.   Respiratory:  Positive for shortness of breath. Negative for cough, hemoptysis, sputum production and wheezing.   Cardiovascular:  Positive for orthopnea and claudication. Negative for chest pain, palpitations, leg swelling and PND.       Sleeping at an  incline related to OSA and claudication has improved.   Gastrointestinal:  Negative for abdominal pain, blood in stool, constipation, diarrhea, heartburn, melena, nausea and vomiting.  Genitourinary:  Negative for dysuria, flank pain, frequency, hematuria and urgency.  Musculoskeletal:  Positive for falls. Negative for back pain, joint  pain, myalgias and neck pain.       Two falls related to orthostatic dizziness.   Skin:  Negative for itching and rash.  Neurological:  Positive for dizziness and headaches. Negative for tingling, tremors, sensory change, speech change, focal weakness, seizures, loss of consciousness and weakness.       Has a hx of migraines. See HPI regarding dizziness.  Endo/Heme/Allergies:  Negative for environmental allergies and polydipsia. Does not bruise/bleed easily.  Psychiatric/Behavioral:  Negative for depression, hallucinations, memory loss, substance abuse and suicidal ideas. The patient is not nervous/anxious and does not have insomnia.    Please see the history of present illness.    All other systems reviewed and are negative.  EKGs/Labs/Other Studies Reviewed:    The following studies were reviewed today:   EKG:  EKG is not ordered today.    Vascular ultrasound lower extremity venous post ablation left on February 14, 2022: Left: No evidence of DVT seen in the left lower extremity, from the common femoral through the popliteal veins.  Complete ablation of the great saphenous vein from the knee to within 1.99 cm of the sapheno-femoral junction.  Vascular ultrasound lower extremity venous reflux bilateral on August 22, 2021: Right: No evidence of DVT seen in the right lower extremity, from the common femoral to the popliteal veins.  No evidence of superficial venous reflux seen in the right short saphenous vein.  Venous reflux is noted in the right sapheno-femoral junction.  Venous reflux is noted in the right greater saphenous vein in the thigh.  Venous reflux is  noted in the right greater saphenous vein in the calf. Left: No evidence of DVT seen in the left lower extremity, from the common femoral through the popliteal veins.  No evidence of superficial venous reflux seen in the left short saphenous vein.  Venous reflux is noted in the left common femoral vein.  Venous reflux is noted in the left sapheno-femoral junction.  Venous reflux is noted in the left greater saphenous vein in the thigh.  Vascular ultrasound lower extremity on July 23, 2021: Right ABI is 1.18 with TBI at 0.86.  Left ABI is 1.16 with TBI at 0.86.  Normal blood flow to both legs.  2D echo on February 23, 2021: LVEF is 60 to 65%.  Normal left ventricular function.  No left ventricular regional wall abnormalities.  Left ventricular diastolic parameters were normal. Right ventricular systolic function is low normal.  The right ventricular size is mildly enlarged. The mitral valve is normal in structure.  No evidence of mitral valve regurgitation. The aortic valve is normal in structure.  Aortic valve regurgitation is not visualized.  No aortic stenosis is present.   Coronary CT on July 30, 2019: Coronary calcium score of 75.7.  This was the 97th percentile for age and sex matched control.  Normal coronary origin with right dominance.  Nonobstructive, calcified plaque noted in the proximal LAD.  All hiatal hernia noted on CT.  Fatty infiltration of the liver.  Stable low density lesion in the right hepatic lobe, likely cyst.  Venous ultrasound left lower extremity on December 01, 2012: No evidence of left lower extremity DVT.   Recent Labs: 05/23/2021: TSH 2.33 01/17/2022: ALT 91 02/28/2022: BUN 13; Creatinine, Ser 0.82; Hemoglobin 13.9; Platelets 253; Potassium 3.8; Sodium 141  Recent Lipid Panel    Component Value Date/Time   CHOL 179 12/07/2021 1218   TRIG 155 (H) 12/07/2021 1218   HDL 47 12/07/2021 1218  CHOLHDL 3.8 12/07/2021 1218   CHOLHDL 3 05/23/2021 1035   VLDL 26.8  05/23/2021 1035   LDLCALC 105 (H) 12/07/2021 1218   LDLCALC 127 (H) 06/01/2019 1556     Risk Assessment/Calculations:    The 10-year ASCVD risk score (Arnett DK, et al., 2019) is: 1.5%   Values used to calculate the score:     Age: 39 years     Sex: Female     Is Non-Hispanic African American: No     Diabetic: No     Tobacco smoker: No     Systolic Blood Pressure: 099 mmHg     Is BP treated: Yes     HDL Cholesterol: 47 mg/dL     Total Cholesterol: 179 mg/dL   Physical Exam:    VS:  BP 110/60   Pulse 67   Ht '5\' 4"'$  (1.626 m)   Wt 245 lb (111.1 kg)   LMP 08/23/2017   BMI 42.05 kg/m     Wt Readings from Last 3 Encounters:  03/05/22 245 lb (111.1 kg)  02/28/22 240 lb (108.9 kg)  02/14/22 242 lb (109.8 kg)    Orthostatic VS for the past 24 hrs:  BP- Lying Pulse- Lying BP- Sitting Pulse- Sitting BP- Standing at 0 minutes Pulse- Standing at 0 minutes  03/05/22 1551 125/80 61 117/82 67 116/78 75    GEN: Well nourished, well developed in no acute distress HEENT: Normal NECK: No JVD; No carotid bruits CARDIAC: RRR, no murmurs, rubs, gallops RESPIRATORY:  Clear and diminished to auscultation without rales, wheezing or rhonchi  ABDOMEN: Soft, non-tender, non-distended, bowel sounds X 4 MUSCULOSKELETAL:  No edema; No deformity  SKIN: Warm and dry NEUROLOGIC:  Alert and oriented x 3 PSYCHIATRIC:  Normal affect   ASSESSMENT:    1. DOE (dyspnea on exertion)   2. Hypertension, unspecified type   3. Coronary artery disease involving native heart without angina pectoris, unspecified vessel or lesion type   4. Hyperlipidemia, unspecified hyperlipidemia type   5. OSA on CPAP   6. Peripheral vascular disease, unspecified ([place])   7. Dizziness    PLAN:    In order of problems listed above:  Dyspnea on exertion - acute, stable Euvolemic and well compensated on exam. Does have some exercise intolerance recently, without angina. Last 2D echo performed last year was overall  normal.  Will go ahead an order a 2D echo to re-evaluate heart function due to recent dyspnea on exertion. Consider etiology deconditioning vs HF.  2. HTN - chronic, stable BP stable on exam. Has a wrist Apple watch equivalent at home. Discussed using an arm cuff and recommend Omron to her. Discussed to monitor BP at home at least 2 hours after medications and sitting for 5-10 minutes.  Told her to monitor blood pressure for the next 2 weeks and let me know via MyChart. Continue current medication regimen.  Low-salt diet Mediterranean diet recommended.  Continue current antihypertensive medication regimen.  3-4. CAD, HLD with LDL goal < 70 - chronic, not progressing ED visit with chest pain atypical for angina as occurred at rest. No recurrent chest pain. No indication for ischemic evaluation. Last LDL on April 2023 was not at goal at 105. Crestor will be taken off her medication list and added to her allergies for intolerance as mentioned above.  Start Zetia 10 mg daily and we will recheck a fasting lipid panel and liver enzymes in 2 months.   5. OSA - chronic, not progessing Has been  elevating her bed 45 degrees due to obstructive sleep apnea and husband has witnessed her stopping breathing in her sleep.  Recommended that she needs to follow-up with getting in touch with CPAP company to obtain her CPAP.   6. Peripheral vascular disease- chronic, improved Has improved from her last visit and recent surgery.  Initiate Zetia 10 mg daily and continue aspirin 81 mg daily.   7. Dizziness- acute, not progressing Occurs when changing positions or when looking at games online.  Educated her about changing positions slowly and to wear compression stockings during the day.  Orthostatic blood pressures were negative today.  8. Obesity - chronic, stable Continue Mediterranean diet. Recommended 150 minutes of moderate activity for 5 days per week and to limit salt in diet. Weight loss via diet and exercise  encouraged. Discussed that she needs to weigh herself daily and if she notices weight gain of more than 3 pounds a day or more than 5 pounds in a week to let our office know.   9. Disposition - F/U in 6 months or sooner if needed.    Medication Adjustments/Labs and Tests Ordered: Current medicines are reviewed at length with the patient today.  Concerns regarding medicines are outlined above.  Orders Placed This Encounter  Procedures   Lipid panel   Hepatic function panel   ECHOCARDIOGRAM COMPLETE   Meds ordered this encounter  Medications   ezetimibe (ZETIA) 10 MG tablet    Sig: Take 1 tablet (10 mg total) by mouth daily.    Dispense:  90 tablet    Refill:  3    Patient Instructions  Medication Instructions:  Your physician has recommended you make the following change in your medication:   Stop: Crestor   Start:  Zetia '10mg'$    *If you need a refill on your cardiac medications before your next appointment, please call your pharmacy*   Lab Work: Please return for Lab work in 2 months for fasting lipid panel and liver function tests. You may come to the...   [facility] (3rd floor) [address], [place], [place] [address]  Open: 8am-Noon and 1pm-4:30pm  Please ring the doorbell on the small table when you exit the elevator and the Lab Tech will come get you  [facility] [facility] [address] [place], [place], [place] [address] Open: 8am-1pm, then 2pm-4:30pm   [facility]- Please see attached locations sheet stapled to your lab work with address and hours.   If you have labs (blood work) drawn today and your tests are completely normal, you will receive your results only by: [place] (if you have MyChart) OR A paper copy in the mail If you have any lab test that is abnormal or we need to change your treatment, we will call you to review the results.   Testing/Procedures: Your physician has requested that you have an  echocardiogram. Echocardiography is a painless test that uses sound waves to create images of your heart. It provides your doctor with information about the size and shape of your heart and how well your heart's chambers and valves are working. This procedure takes approximately one hour. There are no restrictions for this procedure. [facility], you and your health needs are our priority.  As part of our continuing mission to provide you with exceptional heart care, we have created designated Provider Care Teams.  These Care Teams include your primary Cardiologist (  physician) and Advanced Practice Providers (APPs -  Physician Assistants and Nurse Practitioners) who all work together to provide you with the care you need, when you need it.  We recommend signing up for the patient portal called "MyChart".  Sign up information is provided on this After Visit Summary.  MyChart is used to connect with patients for Virtual Visits (Telemedicine).  Patients are able to view lab/test results, encounter notes, upcoming appointments, etc.  Non-urgent messages can be sent to your provider as well.   To learn more about what you can do with MyChart, go to [email or web address].    Your next appointment:   6 month(s)  The format for your next appointment:   In Person  Provider:   [name], MD or Laurann [place], [facility]  Other Instructions Your provider recommends purchasing an upper arm automatic blood pressure cuff for home monitoring of blood pressure. These can be purchased from your pharmacy, [employer], [facility], etc. Please do not purchase a wrist cuff as they are often inaccurate. Omron is a well-trusted brand for an upper arm cuff and the cost should around $35. Please monitor your blood pressure once daily about 1-2 hours after medication and keep a log, please send [name] this log via mychart in two weeks.   We will send a message to our sleep  pool team to try and follow up on your CPAP machine.   Exercise recommendations: The American Heart Association recommends 150 minutes of moderate intensity exercise weekly. Try 30 minutes of moderate intensity exercise 4-5 times per week. This could include walking, jogging, or swimming.    DASH Eating Plan DASH stands for Dietary Approaches to Stop Hypertension. The DASH eating plan is a healthy eating plan that has been shown to: Reduce high blood pressure (hypertension). Reduce your risk for type 2 diabetes, heart disease, and stroke. Help with weight loss. What are tips for following this plan? Reading food labels Check food labels for the amount of salt (sodium) per serving. Choose foods with less than 5 percent of the Daily Value of sodium. Generally, foods with less than 300 milligrams (mg) of sodium per serving fit into this eating plan. To find whole grains, look for the word "whole" as the first word in the ingredient list. Shopping Buy products labeled as "low-sodium" or "no salt added." Buy fresh foods. Avoid canned foods and pre-made or frozen meals. Cooking Avoid adding salt when cooking. Use salt-free seasonings or herbs instead of table salt or sea salt. Check with your health care provider or pharmacist before using salt substitutes. Do not fry foods. Cook foods using healthy methods such as baking, boiling, grilling, roasting, and broiling instead. Cook with heart-healthy oils, such as olive, canola, avocado, soybean, or sunflower oil. Meal planning  Eat a balanced diet that includes: 4 or more servings of fruits and 4 or more servings of vegetables each day. Try to fill one-half of your plate with fruits and vegetables. 6-8 servings of whole grains each day. Less than 6 oz (170 g) of lean meat, poultry, or fish each day. A 3-oz (85-g) serving of meat is about the same size as a deck of cards. One egg equals 1 oz (28 g). 2-3 servings of low-fat dairy each day. One  serving is 1 cup (237 mL). 1 serving of nuts, seeds, or beans 5 times each week. 2-3 servings of heart-healthy fats. Healthy fats called omega-3 fatty acids are found in foods such as walnuts, flaxseeds, fortified milks, and eggs.  These fats are also found in cold-water fish, such as sardines, salmon, and mackerel. Limit how much you eat of: Canned or prepackaged foods. Food that is high in trans fat, such as some fried foods. Food that is high in saturated fat, such as fatty meat. Desserts and other sweets, sugary drinks, and other foods with added sugar. Full-fat dairy products. Do not salt foods before eating. Do not eat more than 4 egg yolks a week. Try to eat at least 2 vegetarian meals a week. Eat more home-cooked food and less restaurant, buffet, and fast food. Lifestyle When eating at a restaurant, ask that your food be prepared with less salt or no salt, if possible. If you drink alcohol: Limit how much you use to: 0-1 drink a day for women who are not pregnant. 0-2 drinks a day for men. Be aware of how much alcohol is in your drink. In the [place] one drink equals one 12 oz bottle of beer (355 mL), one 5 oz glass of wine (148 mL), or one 1 oz glass of hard liquor (44 mL). General information Avoid eating more than 2,300 mg of salt a day. If you have hypertension, you may need to reduce your sodium intake to 1,500 mg a day. Work with your health care provider to maintain a healthy body weight or to lose weight. Ask what an ideal weight is for you. Get at least 30 minutes of exercise that causes your heart to beat faster (aerobic exercise) most days of the week. Activities may include walking, swimming, or biking. Work with your health care provider or dietitian to adjust your eating plan to your individual calorie needs. What foods should I eat? Fruits All fresh, dried, or frozen fruit. Canned fruit in natural juice (without added sugar). Vegetables Fresh or frozen vegetables  (raw, steamed, roasted, or grilled). Low-sodium or reduced-sodium tomato and vegetable juice. Low-sodium or reduced-sodium tomato sauce and tomato paste. Low-sodium or reduced-sodium canned vegetables. Grains Whole-grain or whole-wheat bread. Whole-grain or whole-wheat pasta. Brown rice. [place]. Bulgur. Whole-grain and low-sodium cereals. Pita bread. Low-fat, low-sodium crackers. Whole-wheat flour tortillas. Meats and other proteins Skinless chicken or [place]. Ground chicken or [place]. Pork with fat trimmed off. Fish and seafood. Egg whites. Dried beans, peas, or lentils. Unsalted nuts, nut butters, and seeds. Unsalted canned beans. Lean cuts of beef with fat trimmed off. Low-sodium, lean precooked or cured meat, such as sausages or meat loaves. Dairy Low-fat (1%) or fat-free (skim) milk. Reduced-fat, low-fat, or fat-free cheeses. Nonfat, low-sodium ricotta or cottage cheese. Low-fat or nonfat yogurt. Low-fat, low-sodium cheese. Fats and oils Soft margarine without trans fats. Vegetable oil. Reduced-fat, low-fat, or light mayonnaise and salad dressings (reduced-sodium). Canola, safflower, olive, avocado, soybean, and sunflower oils. Avocado. Seasonings and condiments Herbs. Spices. Seasoning mixes without salt. Other foods Unsalted popcorn and pretzels. Fat-free sweets. The items listed above may not be a complete list of foods and beverages you can eat. Contact a dietitian for more information. What foods should I avoid? Fruits Canned fruit in a light or heavy syrup. Fried fruit. Fruit in cream or butter sauce. Vegetables Creamed or fried vegetables. Vegetables in a cheese sauce. Regular canned vegetables (not low-sodium or reduced-sodium). Regular canned tomato sauce and paste (not low-sodium or reduced-sodium). Regular tomato and vegetable juice (not low-sodium or reduced-sodium). Angie Fava. Olives. Grains Baked goods made with fat, such as croissants, muffins, or some breads. Dry pasta  or rice meal packs. Meats and other proteins Fatty cuts of meat.  Ribs. Maceo Pro meat. Berniece Salines. Bologna, salami, and other precooked or cured meats, such as sausages or meat loaves. Fat from the back of a pig (fatback). Bratwurst. Salted nuts and seeds. Canned beans with added salt. Canned or smoked fish. Whole eggs or egg yolks. Chicken or [place] with skin. Dairy Whole or 2% milk, cream, and half-and-half. Whole or full-fat cream cheese. Whole-fat or sweetened yogurt. Full-fat cheese. Nondairy creamers. Whipped toppings. Processed cheese and cheese spreads. Fats and oils Butter. Stick margarine. Lard. Shortening. Ghee. Bacon fat. Tropical oils, such as coconut, palm kernel, or palm oil. Seasonings and condiments Onion salt, garlic salt, seasoned salt, table salt, and sea salt. Worcestershire sauce. Tartar sauce. Barbecue sauce. Teriyaki sauce. Soy sauce, including reduced-sodium. Steak sauce. Canned and packaged gravies. Fish sauce. Oyster sauce. Cocktail sauce. Store-bought horseradish. Ketchup. Mustard. Meat flavorings and tenderizers. Bouillon cubes. Hot sauces. Pre-made or packaged marinades. Pre-made or packaged taco seasonings. Relishes. Regular salad dressings. Other foods Salted popcorn and pretzels. The items listed above may not be a complete list of foods and beverages you should avoid. Contact a dietitian for more information. Where to find more information National Heart, Lung, and Blood Institute: https://wilson-eaton.com/ American Heart Association: www.heart.org Academy of Nutrition and Dietetics: www.eatright.[place]: www.kidney.org Summary The DASH eating plan is a healthy eating plan that has been shown to reduce high blood pressure (hypertension). It may also reduce your risk for type 2 diabetes, heart disease, and stroke. When on the DASH eating plan, aim to eat more fresh fruits and vegetables, whole grains, lean proteins, low-fat dairy, and heart-healthy  fats. With the DASH eating plan, you should limit salt (sodium) intake to 2,300 mg a day. If you have hypertension, you may need to reduce your sodium intake to 1,500 mg a day. Work with your health care provider or dietitian to adjust your eating plan to your individual calorie needs. This information is not intended to replace advice given to you by your health care provider. Make sure you discuss any questions you have with your health care provider. Document Revised: 07/02/2019 Document Reviewed: 07/02/2019 Elsevier Patient Education  [place], Finis Bud, [place]  03/05/2022 4:02 PM    [place]

## 2022-03-05 ENCOUNTER — Ambulatory Visit (INDEPENDENT_AMBULATORY_CARE_PROVIDER_SITE_OTHER): Payer: BC Managed Care – PPO | Admitting: Nurse Practitioner

## 2022-03-05 ENCOUNTER — Encounter (HOSPITAL_BASED_OUTPATIENT_CLINIC_OR_DEPARTMENT_OTHER): Payer: Self-pay | Admitting: Nurse Practitioner

## 2022-03-05 VITALS — BP 110/60 | HR 67 | Ht 64.0 in | Wt 245.0 lb

## 2022-03-05 DIAGNOSIS — G4733 Obstructive sleep apnea (adult) (pediatric): Secondary | ICD-10-CM

## 2022-03-05 DIAGNOSIS — I10 Essential (primary) hypertension: Secondary | ICD-10-CM

## 2022-03-05 DIAGNOSIS — I2510 Atherosclerotic heart disease of native coronary artery without angina pectoris: Secondary | ICD-10-CM

## 2022-03-05 DIAGNOSIS — E785 Hyperlipidemia, unspecified: Secondary | ICD-10-CM

## 2022-03-05 DIAGNOSIS — Z6841 Body Mass Index (BMI) 40.0 and over, adult: Secondary | ICD-10-CM

## 2022-03-05 DIAGNOSIS — R42 Dizziness and giddiness: Secondary | ICD-10-CM

## 2022-03-05 DIAGNOSIS — I739 Peripheral vascular disease, unspecified: Secondary | ICD-10-CM

## 2022-03-05 DIAGNOSIS — R0609 Other forms of dyspnea: Secondary | ICD-10-CM | POA: Diagnosis not present

## 2022-03-05 MED ORDER — EZETIMIBE 10 MG PO TABS: 10.0000 mg | ORAL_TABLET | Freq: Every day | ORAL | 3 refills | Status: DC

## 2022-03-05 NOTE — Patient Instructions (Addendum)
Medication Instructions:  Your physician has recommended you make the following change in your medication:   Stop: Crestor   Start:  Zetia '10mg'$    *If you need a refill on your cardiac medications before your next appointment, please call your pharmacy*   Lab Work: Please return for Lab work in 2 months for fasting lipid panel and liver function tests. You may come to the...   [facility] (3rd floor) [address] [place], [place] [address]  Open: 8am-Noon and 1pm-4:30pm  Please ring the doorbell on the small table when you exit the elevator and the Lab Tech will come get you  [place] [facility] [address] [place], [place], [place] [address] Open: 8am-1pm, then 2pm-4:30pm   [place]- Please see attached locations sheet stapled to your lab work with address and hours.   If you have labs (blood work) drawn today and your tests are completely normal, you will receive your results only by: [place] (if you have MyChart) OR A paper copy in the mail If you have any lab test that is abnormal or we need to change your treatment, we will call you to review the results.   Testing/Procedures: Your physician has requested that you have an echocardiogram. Echocardiography is a painless test that uses sound waves to create images of your heart. It provides your doctor with information about the size and shape of your heart and how well your heart's chambers and valves are working. This procedure takes approximately one hour. There are no restrictions for this procedure. [place], you and your health needs are our priority.  As part of our continuing mission to provide you with exceptional heart care, we have created designated Provider Care Teams.  These Care Teams include your primary Cardiologist (physician) and Advanced Practice Providers (APPs -  Physician Assistants and Nurse  Practitioners) who all work together to provide you with the care you need, when you need it.  We recommend signing up for the patient portal called "MyChart".  Sign up information is provided on this After Visit Summary.  MyChart is used to connect with patients for Virtual Visits (Telemedicine).  Patients are able to view lab/test results, encounter notes, upcoming appointments, etc.  Non-urgent messages can be sent to your provider as well.   To learn more about what you can do with MyChart, go to [email or web address].    Your next appointment:   6 month(s)  The format for your next appointment:   In Person  Provider:   [name], MD or Laurann [place], [facility]  Other Instructions Your provider recommends purchasing an upper arm automatic blood pressure cuff for home monitoring of blood pressure. These can be purchased from your pharmacy, [employer], [place], etc. Please do not purchase a wrist cuff as they are often inaccurate. Omron is a well-trusted brand for an upper arm cuff and the cost should around $35. Please monitor your blood pressure once daily about 1-2 hours after medication and keep a log, please send [name] this log via mychart in two weeks.   We will send a message to our sleep pool team to try and follow up on your CPAP machine.   Exercise recommendations: The American Heart Association recommends 150 minutes of moderate intensity exercise weekly. Try 30 minutes of moderate intensity exercise 4-5 times per week. This could include walking, jogging, or swimming.    DASH Eating Plan  DASH stands for Dietary Approaches to Stop Hypertension. The DASH eating plan is a healthy eating plan that has been shown to: Reduce high blood pressure (hypertension). Reduce your risk for type 2 diabetes, heart disease, and stroke. Help with weight loss. What are tips for following this plan? Reading food labels Check food labels for the amount of salt (sodium) per serving. Choose foods  with less than 5 percent of the Daily Value of sodium. Generally, foods with less than 300 milligrams (mg) of sodium per serving fit into this eating plan. To find whole grains, look for the word "whole" as the first word in the ingredient list. Shopping Buy products labeled as "low-sodium" or "no salt added." Buy fresh foods. Avoid canned foods and pre-made or frozen meals. Cooking Avoid adding salt when cooking. Use salt-free seasonings or herbs instead of table salt or sea salt. Check with your health care provider or pharmacist before using salt substitutes. Do not fry foods. Cook foods using healthy methods such as baking, boiling, grilling, roasting, and broiling instead. Cook with heart-healthy oils, such as olive, canola, avocado, soybean, or sunflower oil. Meal planning  Eat a balanced diet that includes: 4 or more servings of fruits and 4 or more servings of vegetables each day. Try to fill one-half of your plate with fruits and vegetables. 6-8 servings of whole grains each day. Less than 6 oz (170 g) of lean meat, poultry, or fish each day. A 3-oz (85-g) serving of meat is about the same size as a deck of cards. One egg equals 1 oz (28 g). 2-3 servings of low-fat dairy each day. One serving is 1 cup (237 mL). 1 serving of nuts, seeds, or beans 5 times each week. 2-3 servings of heart-healthy fats. Healthy fats called omega-3 fatty acids are found in foods such as walnuts, flaxseeds, fortified milks, and eggs. These fats are also found in cold-water fish, such as sardines, salmon, and mackerel. Limit how much you eat of: Canned or prepackaged foods. Food that is high in trans fat, such as some fried foods. Food that is high in saturated fat, such as fatty meat. Desserts and other sweets, sugary drinks, and other foods with added sugar. Full-fat dairy products. Do not salt foods before eating. Do not eat more than 4 egg yolks a week. Try to eat at least 2 vegetarian meals a  week. Eat more home-cooked food and less restaurant, buffet, and fast food. Lifestyle When eating at a restaurant, ask that your food be prepared with less salt or no salt, if possible. If you drink alcohol: Limit how much you use to: 0-1 drink a day for women who are not pregnant. 0-2 drinks a day for men. Be aware of how much alcohol is in your drink. In the [place] one drink equals one 12 oz bottle of beer (355 mL), one 5 oz glass of wine (148 mL), or one 1 oz glass of hard liquor (44 mL). General information Avoid eating more than 2,300 mg of salt a day. If you have hypertension, you may need to reduce your sodium intake to 1,500 mg a day. Work with your health care provider to maintain a healthy body weight or to lose weight. Ask what an ideal weight is for you. Get at least 30 minutes of exercise that causes your heart to beat faster (aerobic exercise) most days of the week. Activities may include walking, swimming, or biking. Work with your health care provider or dietitian to adjust your  eating plan to your individual calorie needs. What foods should I eat? Fruits All fresh, dried, or frozen fruit. Canned fruit in natural juice (without added sugar). Vegetables Fresh or frozen vegetables (raw, steamed, roasted, or grilled). Low-sodium or reduced-sodium tomato and vegetable juice. Low-sodium or reduced-sodium tomato sauce and tomato paste. Low-sodium or reduced-sodium canned vegetables. Grains Whole-grain or whole-wheat bread. Whole-grain or whole-wheat pasta. Brown rice. [place]row. Bulgur. Whole-grain and low-sodium cereals. Pita bread. Low-fat, low-sodium crackers. Whole-wheat flour tortillas. Meats and other proteins Skinless chicken or [place]. Ground chicken or [place]. Pork with fat trimmed off. Fish and seafood. Egg whites. Dried beans, peas, or lentils. Unsalted nuts, nut butters, and seeds. Unsalted canned beans. Lean cuts of beef with fat trimmed off. Low-sodium, lean  precooked or cured meat, such as sausages or meat loaves. Dairy Low-fat (1%) or fat-free (skim) milk. Reduced-fat, low-fat, or fat-free cheeses. Nonfat, low-sodium ricotta or cottage cheese. Low-fat or nonfat yogurt. Low-fat, low-sodium cheese. Fats and oils Soft margarine without trans fats. Vegetable oil. Reduced-fat, low-fat, or light mayonnaise and salad dressings (reduced-sodium). Canola, safflower, olive, avocado, soybean, and sunflower oils. Avocado. Seasonings and condiments Herbs. Spices. Seasoning mixes without salt. Other foods Unsalted popcorn and pretzels. Fat-free sweets. The items listed above may not be a complete list of foods and beverages you can eat. Contact a dietitian for more information. What foods should I avoid? Fruits Canned fruit in a light or heavy syrup. Fried fruit. Fruit in cream or butter sauce. Vegetables Creamed or fried vegetables. Vegetables in a cheese sauce. Regular canned vegetables (not low-sodium or reduced-sodium). Regular canned tomato sauce and paste (not low-sodium or reduced-sodium). Regular tomato and vegetable juice (not low-sodium or reduced-sodium). Angie Fava. Olives. Grains Baked goods made with fat, such as croissants, muffins, or some breads. Dry pasta or rice meal packs. Meats and other proteins Fatty cuts of meat. Ribs. Fried meat. Berniece Salines. Bologna, salami, and other precooked or cured meats, such as sausages or meat loaves. Fat from the back of a pig (fatback). Bratwurst. Salted nuts and seeds. Canned beans with added salt. Canned or smoked fish. Whole eggs or egg yolks. Chicken or [place] with skin. Dairy Whole or 2% milk, cream, and half-and-half. Whole or full-fat cream cheese. Whole-fat or sweetened yogurt. Full-fat cheese. Nondairy creamers. Whipped toppings. Processed cheese and cheese spreads. Fats and oils Butter. Stick margarine. Lard. Shortening. Ghee. Bacon fat. Tropical oils, such as coconut, palm kernel, or palm oil. Seasonings  and condiments Onion salt, garlic salt, seasoned salt, table salt, and sea salt. Worcestershire sauce. Tartar sauce. Barbecue sauce. Teriyaki sauce. Soy sauce, including reduced-sodium. Steak sauce. Canned and packaged gravies. Fish sauce. Oyster sauce. Cocktail sauce. Store-bought horseradish. Ketchup. Mustard. Meat flavorings and tenderizers. Bouillon cubes. Hot sauces. Pre-made or packaged marinades. Pre-made or packaged taco seasonings. Relishes. Regular salad dressings. Other foods Salted popcorn and pretzels. The items listed above may not be a complete list of foods and beverages you should avoid. Contact a dietitian for more information. Where to find more information National Heart, Lung, and Blood Institute: https://Jaamal Farooqui-eaton.com/ American Heart Association: www.heart.org Academy of Nutrition and Dietetics: www.eatright.[place]: www.kidney.org Summary The DASH eating plan is a healthy eating plan that has been shown to reduce high blood pressure (hypertension). It may also reduce your risk for type 2 diabetes, heart disease, and stroke. When on the DASH eating plan, aim to eat more fresh fruits and vegetables, whole grains, lean proteins, low-fat dairy, and heart-healthy fats. With the DASH eating plan,  you should limit salt (sodium) intake to 2,300 mg a day. If you have hypertension, you may need to reduce your sodium intake to 1,500 mg a day. Work with your health care provider or dietitian to adjust your eating plan to your individual calorie needs. This information is not intended to replace advice given to you by your health care provider. Make sure you discuss any questions you have with your health care provider. Document Revised: 07/02/2019 Document Reviewed: 07/02/2019 Elsevier Patient Education  [place].

## 2022-03-07 ENCOUNTER — Telehealth: Payer: Self-pay

## 2022-03-07 NOTE — Telephone Encounter (Signed)
Pt went to ED  [place] [facility] RECORD AccessNurse Patient Name: Shannon Quinn Gender: Female DOB: 30-Jan-1971 Age: 51 Y 11 M 18 D Return Phone Number: [phone] (Primary), [phone] (Secondary) Address: [place]/ [place]  [address] Client [facility] - Client Client Site [place] - Day Provider Raoul Pitch, [place] Type Call Who Is Calling Patient / Member / Family / Caregiver Call Type Triage / Clinical Relationship To Patient Self Return Phone Number [phone] (Primary) Chief Complaint CHEST PAIN - pain, pressure, heaviness or tightness Reason for Call Symptomatic / Request for [place] has chest pains. Translation No Nurse Assessment Nurse: Toribio Harbour, RN, Joelene Millin Date/Time (Eastern Time): 02/28/2022 2:54:12 PM Confirm and document reason for call. If symptomatic, describe symptoms. ---Caller has chest pains. Does the patient have any new or worsening symptoms? ---Yes Will a triage be completed? ---Yes Related visit to physician within the last 2 weeks? ---No Does the PT have any chronic conditions? (i.e. diabetes, asthma, this includes High risk factors for pregnancy, etc.) ---Yes List chronic conditions. ---cancer, heart blockage, migraines Is the patient pregnant or possibly pregnant? (Ask all females between the ages of 65-55) ---No Is this a behavioral health or substance abuse call? ---No Guidelines Guideline Title Affirmed Question Affirmed Notes Nurse Date/Time Eilene Ghazi Time) Chest Pain [1] Chest pain lasts > 5 minutes AND [2] age > 88 Toribio Harbour, RN, Joelene Millin 02/28/2022 2:55:58 PM Disp. Time Eilene Ghazi Time) Disposition Final User 02/28/2022 2:52:37 PM Send to Urgent Merry Proud 02/28/2022 2:57:44 PM Call EMS 911 Now Yes Toribio Harbour, RNJoelene Millin 02/28/2022 3:04:18 PM 911 Outcome Documentation Daves, RN, Joelene Millin

## 2022-03-14 ENCOUNTER — Ambulatory Visit (INDEPENDENT_AMBULATORY_CARE_PROVIDER_SITE_OTHER): Payer: BC Managed Care – PPO

## 2022-03-14 DIAGNOSIS — I10 Essential (primary) hypertension: Secondary | ICD-10-CM | POA: Diagnosis not present

## 2022-03-14 LAB — ECHOCARDIOGRAM COMPLETE
AR max vel: 1.98 cm2
AV Area VTI: 2.15 cm2
AV Area mean vel: 2.11 cm2
AV Mean grad: 5 mmHg
AV Peak grad: 10.2 mmHg
Ao pk vel: 1.6 m/s
Area-P 1/2: 3.42 cm2
S' Lateral: 2.32 cm

## 2022-03-18 ENCOUNTER — Telehealth (HOSPITAL_BASED_OUTPATIENT_CLINIC_OR_DEPARTMENT_OTHER): Payer: Self-pay

## 2022-03-18 NOTE — Telephone Encounter (Addendum)
Results called to patient who verbalizes understanding!    ----- Message from Finis Bud, NP sent at 03/15/2022  9:26 AM EDT ----- Please call and update patient about her Echocardiogram - looks good. Heart is pumping well and no problems with her heart valves. 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 I would recommend that she follows up with her PCP regarding this and they may want to do a Liver Ultrasound if clinically indicated.   We will continue the current treatment plan as discussed.   Thanks so much!   Take Care, Finis Bud, AGNP-C

## 2022-03-19 ENCOUNTER — Ambulatory Visit: Payer: BC Managed Care – PPO | Admitting: Family Medicine

## 2022-03-19 ENCOUNTER — Encounter: Payer: Self-pay | Admitting: Family Medicine

## 2022-03-19 VITALS — BP 103/69 | HR 75 | Temp 98.4°F | Ht 64.0 in | Wt 248.0 lb

## 2022-03-19 DIAGNOSIS — K7689 Other specified diseases of liver: Secondary | ICD-10-CM | POA: Diagnosis not present

## 2022-03-19 NOTE — Progress Notes (Unsigned)
Shannon Quinn , 09-08-1970, 51 y.o., female MRN: [id number] Patient Care Team    Relationship Specialty Notifications Start End  Ma Hillock, DO PCP - General Family Medicine  11/03/17   Lady Gary, [facility]    11/03/17   Everlene Farrier, MD Consulting Physician Obstetrics and Gynecology  11/03/17   Jerrell Belfast, MD Consulting Physician Otolaryngology  11/03/17   Alda Berthold, DO Consulting Physician Neurology  11/03/17   Netta Cedars, MD Consulting Physician Orthopedic Surgery  06/01/19   Fanny Skates, MD Consulting Physician General Surgery  06/01/19   Irene Shipper, MD Consulting Physician Gastroenterology  05/23/21   [name], MD Attending Physician Cardiology  05/23/21     Chief Complaint  Patient presents with   hepatic cyst     Subjective: Pt presents for an OV with concerns over hepatic cyst recently noted on echocardiogram.  She was told by her cardiology team to follow-up with her PCP concerning cyst.  Patient has a significant medical history of melanoma in which she is followed by oncology yearly with full body PET scans.  Last PET scan December 2022.   Diffuse hepatic steatosis. Photopenic hypodense lesion laterally in the left hepatic lobe favoring cyst. Hysterectomy. Mild activity along the perineum without CT correlate, probably from minimal incontinence.     01/04/2022    3:05 PM 11/15/2021    9:07 AM 05/23/2021   10:04 AM 12/29/2020    2:41 PM 11/17/2020   10:49 AM  Depression screen PHQ 2/9  Decreased Interest '1 2 1 '$ 0 1  Down, Depressed, Hopeless 1 2  0 1  PHQ - 2 Score '2 4 1 '$ 0 2  Altered sleeping 2 3 0  2  Tired, decreased energy '3 2 2  3  '$ Change in appetite 0 1 0  1  Feeling bad or failure about yourself  0 0 1  1  Trouble concentrating '1 2 1  '$ 0  Moving slowly or fidgety/restless '1 2 1  3  '$ Suicidal thoughts 0 0 0  0  PHQ-9 Score '9 14 6  12    '$ Allergies  Allergen Reactions   Chlorhexidine Other (See Comments)     Red and purple burns and peeled skin. Cannot use surgical glue.  Pt states Betadine is safe and without reaction.   Crestor [Rosuvastatin] Swelling    Swelling in her hands   Neomycin     UNSPECIFIED REACTION    Other Other (See Comments)    Body glue, turns skin blue   Sulfamethoxazole-Trimethoprim     UNSPECIFIED REACTION    Sulfasalazine Other (See Comments)   Sulfonamide Derivatives     UNSPECIFIED REACTION    Augmentin [Amoxicillin-Pot Clavulanate] Nausea And Vomiting    amox ok.    Hydrocodone Nausea And Vomiting    Severe vomiting   Oxycodone Nausea And Vomiting    Severe vomiting   Prednisone Other (See Comments)    insomnia   Social History   Social History Narrative   Regular exercise: walking 1 mile per day   Caffeine use: soda drinker   Lives with husband and 2 children in a one story home.     Works for [employer].  She works in Science writer   Education: associates degree.     Past Medical History:  Diagnosis Date   Abnormality of right breast on screening mammogram 10/09/2018   Allergy    Atypical  chest pain 06/07/2019   Claudication in peripheral vascular disease ([place]) 07/10/2021   Colon polyps 2011   3 polpys removed   Complication of anesthesia    "severe Reaction" "Physically ill"    Deviated septum    Family history of adverse reaction to anesthesia    violent when waking up from anesthesia - very sick   Hypertension    Hypoglycemic syndrome 1996   during pregnancy   Hypothyroidism    Impingement syndrome of right shoulder region 06/04/2018   Lymphedema of right arm    Melanoma ([place])    03/10/2018   PONV (postoperative nausea and vomiting)    Shoulder injury    right - side happened from last surgery   Thyroid disease    Varicose vein of leg    Past Surgical History:  Procedure Laterality Date   BREAST LUMPECTOMY WITH RADIOACTIVE SEED LOCALIZATION Right 10/09/2018   Procedure: RIGHT BREAST LUMPECTOMY  WITH RADIOACTIVE SEED LOCALIZATION ERAS PATHWAY;  Surgeon: Fanny Skates, MD;  Location: [facility];  Service: General;  Laterality: Right;   COLONOSCOPY     polyps   ENDOVENOUS ABLATION SAPHENOUS VEIN W/ LASER Left 01/31/2022   endovenous laser ablation left greater saphenous vein and stab phlebectomy<10 incisions left leg by Harold Barban MD   LAPAROSCOPIC TOTAL HYSTERECTOMY  09/04/2017   Dr. Gaetano Net for endometriosis   MELANOMA EXCISION WITH SENTINEL LYMPH NODE BIOPSY Left 04/02/2018   Procedure: INJECT BLUE DYE MELANOMA LEFT BACK WITH  WIDE LOCAL EXCISION MELANOMA LEFT BACK BILATERAL AXILLARY DEEP SENTINEL LYMPH NODE BIOPSY;  Surgeon: Fanny Skates, MD;  Location: [facility];  Service: General;  Laterality: Left;   TUBAL LIGATION     WISDOM TOOTH EXTRACTION     Family History  Problem Relation Age of Onset   Arthritis Mother    Dementia Mother 8   Stroke Mother    Alcohol abuse Father    Arthritis Father    Hyperlipidemia Father    Stroke Father        2012   Depression Father    Diabetes Father    Heart disease Father 89   Heart failure Father    Heart attack Father    Diabetes Sister    CAD Maternal Uncle    Colon cancer Maternal Uncle    Cancer Maternal Uncle    CAD Paternal Aunt    Congestive Heart Failure Paternal Aunt    Kidney failure Paternal Aunt    Cancer Paternal Uncle        breast cancer   Colon cancer Paternal Uncle    Breast cancer Maternal Grandmother 60   Dementia Maternal Grandmother    Heart disease Maternal Grandfather 96   CAD Maternal Grandfather    Diabetes Paternal Grandmother    COPD Paternal Grandfather        emphysema   Esophageal cancer Neg Hx    Stomach cancer Neg Hx    Rectal cancer Neg Hx    Allergies as of 03/19/2022       Reactions   Chlorhexidine Other (See Comments)   Red and purple burns and peeled skin. Cannot use surgical glue.  Pt states Betadine is safe and without reaction.   Crestor  [rosuvastatin] Swelling   Swelling in her hands   Neomycin    UNSPECIFIED REACTION    Other Other (See Comments)   Body glue, turns skin blue   Sulfamethoxazole-trimethoprim    UNSPECIFIED REACTION    Sulfasalazine  Other (See Comments)   Sulfonamide Derivatives    UNSPECIFIED REACTION    Augmentin [amoxicillin-pot Clavulanate] Nausea And Vomiting   amox ok.    Hydrocodone Nausea And Vomiting   Severe vomiting   Oxycodone Nausea And Vomiting   Severe vomiting   Prednisone Other (See Comments)   insomnia        Medication List        Accurate as of March 19, 2022 11:59 PM. If you have any questions, ask your nurse or doctor.          amitriptyline 10 MG tablet Commonly known as: ELAVIL Take 1 tablet (10 mg total) by mouth at bedtime. What changed: additional instructions   aspirin EC 81 MG tablet Take 1 tablet (81 mg total) by mouth daily.   Excedrin Migraine 250-250-65 MG tablet Generic drug: aspirin-acetaminophen-caffeine Take 1-2 tablets by mouth every 6 (six) hours as needed for headache or migraine.   ezetimibe 10 MG tablet Commonly known as: ZETIA Take 1 tablet (10 mg total) by mouth daily.   fluticasone 50 MCG/ACT nasal spray Commonly known as: FLONASE Place 2 sprays into both nostrils daily. What changed: how much to take   gabapentin 100 MG capsule Commonly known as: NEURONTIN Take 100 mg by mouth 3 (three) times daily.   hydrochlorothiazide 25 MG tablet Commonly known as: HYDRODIURIL Take 1 tablet (25 mg total) by mouth daily.   hydrOXYzine 10 MG tablet Commonly known as: ATARAX Take 1-3 tablets (10-30 mg total) by mouth 2 (two) times daily as needed.   Omega-3 1000 MG Caps Take 2,000 mg by mouth.   ondansetron 4 MG tablet Commonly known as: ZOFRAN Take 1-2 tablets (4-8 mg total) by mouth every 8 (eight) hours as needed for nausea or vomiting.   OVER THE COUNTER MEDICATION Allergy medication   rizatriptan 5 MG tablet Commonly known  as: Maxalt Take 1 tablet (5 mg total) by mouth as needed for migraine. May repeat in 2 hours if needed   topiramate 50 MG tablet Commonly known as: TOPAMAX Take 1 tablet (50 mg total) by mouth daily.   venlafaxine XR 150 MG 24 hr capsule Commonly known as: EFFEXOR-XR TAKE 1 CAPSULE BY MOUTH EVERY DAY        All past medical history, surgical history, allergies, family history, immunizations andmedications were updated in the EMR today and reviewed under the history and medication portions of their EMR.     ROS Negative, with the exception of above mentioned in HPI   Objective:  BP 103/69   Pulse 75   Temp 98.4 F (36.9 C) (Oral)   Ht '5\' 4"'$  (1.626 m)   Wt 248 lb (112.5 kg)   LMP 08/23/2017   SpO2 95%   BMI 42.57 kg/m  Body mass index is 42.57 kg/m. Physical Exam Vitals and nursing note reviewed.  Constitutional:      General: She is not in acute distress.    Appearance: Normal appearance. She is normal weight. She is not ill-appearing or toxic-appearing.  Eyes:     Extraocular Movements: Extraocular movements intact.     Conjunctiva/sclera: Conjunctivae normal.     Pupils: Pupils are equal, round, and reactive to light.  Neurological:     Mental Status: She is alert and oriented to person, place, and time. Mental status is at baseline.  Psychiatric:        Mood and Affect: Mood normal.        Behavior: Behavior normal.  Thought Content: Thought content normal.        Judgment: Judgment normal.      No results found. No results found. No results found for this or any previous visit (from the past 24 hour(s)).  Assessment/Plan: Shannon Quinn is a 51 y.o. female present for OV for  Hepatic cyst Asymptomatic patient. Reviewed last 3 years of PET scans with patient today, all of which have shown stable hepatic cysts.  Reassured patient her oncologist would be aware of hepatic cyst and it has been appropriately monitored for years and has been noted to be  unchanged and stable.  Reviewed expectations re: course of current medical issues. Discussed self-management of symptoms. Outlined signs and symptoms indicating need for more acute intervention. Patient verbalized understanding and all questions were answered. Patient received an After-Visit Summary.    No orders of the defined types were placed in this encounter.  No orders of the defined types were placed in this encounter.  Referral Orders  No referral(s) requested today     Note is dictated utilizing voice recognition software. Although note has been proof read prior to signing, occasional typographical errors still can be missed. If any questions arise, please do not hesitate to call for verification.   electronically signed by:  Howard Pouch, DO  [place]

## 2022-03-19 NOTE — Patient Instructions (Signed)
Your liver cyst has been present and stable for many years, per your PET SCANS  :)

## 2022-03-20 ENCOUNTER — Encounter: Payer: Self-pay | Admitting: Family Medicine

## 2022-03-21 ENCOUNTER — Telehealth: Payer: Self-pay | Admitting: Oncology

## 2022-03-21 NOTE — Telephone Encounter (Signed)
Called patient regarding upcoming December appointments, patient has questions regarding about cyst for provider. Patient is notified.

## 2022-03-25 DIAGNOSIS — G4733 Obstructive sleep apnea (adult) (pediatric): Secondary | ICD-10-CM | POA: Diagnosis not present

## 2022-04-25 DIAGNOSIS — G4733 Obstructive sleep apnea (adult) (pediatric): Secondary | ICD-10-CM | POA: Diagnosis not present

## 2022-05-10 DIAGNOSIS — E785 Hyperlipidemia, unspecified: Secondary | ICD-10-CM | POA: Diagnosis not present

## 2022-05-11 LAB — HEPATIC FUNCTION PANEL
ALT: 89 IU/L — ABNORMAL HIGH (ref 0–32)
AST: 74 IU/L — ABNORMAL HIGH (ref 0–40)
Albumin: 4.3 g/dL (ref 3.8–4.9)
Alkaline Phosphatase: 97 IU/L (ref 44–121)
Bilirubin Total: 0.4 mg/dL (ref 0.0–1.2)
Bilirubin, Direct: 0.12 mg/dL (ref 0.00–0.40)
Total Protein: 7.4 g/dL (ref 6.0–8.5)

## 2022-05-11 LAB — LIPID PANEL
Chol/HDL Ratio: 3.3 ratio (ref 0.0–4.4)
Cholesterol, Total: 179 mg/dL (ref 100–199)
HDL: 54 mg/dL (ref >39)
LDL Chol Calc (NIH): 107 mg/dL — ABNORMAL HIGH (ref 0–99)
Triglycerides: 100 mg/dL (ref 0–149)
VLDL Cholesterol Cal: 18 mg/dL (ref 5–40)

## 2022-05-25 DIAGNOSIS — G4733 Obstructive sleep apnea (adult) (pediatric): Secondary | ICD-10-CM | POA: Diagnosis not present

## 2022-06-21 ENCOUNTER — Encounter: Payer: Self-pay | Admitting: Family Medicine

## 2022-06-21 ENCOUNTER — Ambulatory Visit (INDEPENDENT_AMBULATORY_CARE_PROVIDER_SITE_OTHER): Payer: BC Managed Care – PPO | Admitting: Family Medicine

## 2022-06-21 VITALS — BP 121/80 | HR 63 | Temp 98.0°F | Ht 65.0 in | Wt 254.6 lb

## 2022-06-21 DIAGNOSIS — N951 Menopausal and female climacteric states: Secondary | ICD-10-CM

## 2022-06-21 DIAGNOSIS — F32A Depression, unspecified: Secondary | ICD-10-CM

## 2022-06-21 DIAGNOSIS — F419 Anxiety disorder, unspecified: Secondary | ICD-10-CM

## 2022-06-21 DIAGNOSIS — Z23 Encounter for immunization: Secondary | ICD-10-CM | POA: Diagnosis not present

## 2022-06-21 DIAGNOSIS — I2510 Atherosclerotic heart disease of native coronary artery without angina pectoris: Secondary | ICD-10-CM

## 2022-06-21 DIAGNOSIS — I10 Essential (primary) hypertension: Secondary | ICD-10-CM

## 2022-06-21 DIAGNOSIS — Z0000 Encounter for general adult medical examination without abnormal findings: Secondary | ICD-10-CM | POA: Diagnosis not present

## 2022-06-21 DIAGNOSIS — I739 Peripheral vascular disease, unspecified: Secondary | ICD-10-CM

## 2022-06-21 DIAGNOSIS — Z79899 Other long term (current) drug therapy: Secondary | ICD-10-CM | POA: Diagnosis not present

## 2022-06-21 DIAGNOSIS — C439 Malignant melanoma of skin, unspecified: Secondary | ICD-10-CM

## 2022-06-21 DIAGNOSIS — Z7689 Persons encountering health services in other specified circumstances: Secondary | ICD-10-CM

## 2022-06-21 DIAGNOSIS — G43009 Migraine without aura, not intractable, without status migrainosus: Secondary | ICD-10-CM

## 2022-06-21 DIAGNOSIS — Z1231 Encounter for screening mammogram for malignant neoplasm of breast: Secondary | ICD-10-CM

## 2022-06-21 DIAGNOSIS — Z8249 Family history of ischemic heart disease and other diseases of the circulatory system: Secondary | ICD-10-CM

## 2022-06-21 DIAGNOSIS — E034 Atrophy of thyroid (acquired): Secondary | ICD-10-CM | POA: Diagnosis not present

## 2022-06-21 LAB — COMPREHENSIVE METABOLIC PANEL
ALT: 88 U/L — ABNORMAL HIGH (ref 0–35)
AST: 63 U/L — ABNORMAL HIGH (ref 0–37)
Albumin: 4 g/dL (ref 3.5–5.2)
Alkaline Phosphatase: 80 U/L (ref 39–117)
BUN: 13 mg/dL (ref 6–23)
CO2: 27 mEq/L (ref 19–32)
Calcium: 9.3 mg/dL (ref 8.4–10.5)
Chloride: 107 mEq/L (ref 96–112)
Creatinine, Ser: 0.98 mg/dL (ref 0.40–1.20)
GFR: 66.01 mL/min (ref >60.00)
Glucose, Bld: 105 mg/dL — ABNORMAL HIGH (ref 70–99)
Potassium: 4 mEq/L (ref 3.5–5.1)
Sodium: 141 mEq/L (ref 135–145)
Total Bilirubin: 0.4 mg/dL (ref 0.2–1.2)
Total Protein: 7.1 g/dL (ref 6.0–8.3)

## 2022-06-21 LAB — CBC WITH DIFFERENTIAL/PLATELET
Basophils Absolute: 0.1 10*3/uL (ref 0.0–0.1)
Basophils Relative: 1 % (ref 0.0–3.0)
Eosinophils Absolute: 0.3 10*3/uL (ref 0.0–0.7)
Eosinophils Relative: 3.9 % (ref 0.0–5.0)
HCT: 40.9 % (ref 36.0–46.0)
Hemoglobin: 13.5 g/dL (ref 12.0–15.0)
Lymphocytes Relative: 28.1 % (ref 12.0–46.0)
Lymphs Abs: 1.9 10*3/uL (ref 0.7–4.0)
MCHC: 33.1 g/dL (ref 30.0–36.0)
MCV: 91 fl (ref 78.0–100.0)
Monocytes Absolute: 0.4 10*3/uL (ref 0.1–1.0)
Monocytes Relative: 6 % (ref 3.0–12.0)
Neutro Abs: 4.1 10*3/uL (ref 1.4–7.7)
Neutrophils Relative %: 61 % (ref 43.0–77.0)
Platelets: 259 10*3/uL (ref 150.0–400.0)
RBC: 4.49 Mil/uL (ref 3.87–5.11)
RDW: 14.9 % (ref 11.5–15.5)
WBC: 6.8 10*3/uL (ref 4.0–10.5)

## 2022-06-21 LAB — HEMOGLOBIN A1C: Hgb A1c MFr Bld: 6 % (ref 4.6–6.5)

## 2022-06-21 LAB — TSH: TSH: 2.94 u[IU]/mL (ref 0.35–5.50)

## 2022-06-21 MED ORDER — RIZATRIPTAN BENZOATE 5 MG PO TABS: 5.0000 mg | ORAL_TABLET | ORAL | 5 refills | Status: DC | PRN

## 2022-06-21 MED ORDER — AMITRIPTYLINE HCL 10 MG PO TABS
10.0000 mg | ORAL_TABLET | Freq: Every evening | ORAL | 1 refills | Status: DC | PRN
Start: 2022-06-21 — End: 2023-07-28

## 2022-06-21 MED ORDER — VENLAFAXINE HCL ER 150 MG PO CP24: ORAL_CAPSULE | ORAL | 1 refills | Status: DC

## 2022-06-21 MED ORDER — EZETIMIBE 10 MG PO TABS
10.0000 mg | ORAL_TABLET | Freq: Every day | ORAL | 3 refills | Status: DC
Start: 2022-06-21 — End: 2023-07-28

## 2022-06-21 MED ORDER — HYDROXYZINE HCL 10 MG PO TABS: 10.0000 mg | ORAL_TABLET | Freq: Two times a day (BID) | ORAL | 5 refills | Status: DC | PRN

## 2022-06-21 MED ORDER — TOPIRAMATE 50 MG PO TABS: 50.0000 mg | ORAL_TABLET | Freq: Every day | ORAL | 1 refills | Status: DC

## 2022-06-21 MED ORDER — HYDROCHLOROTHIAZIDE 25 MG PO TABS: 25.0000 mg | ORAL_TABLET | Freq: Every day | ORAL | 1 refills | Status: DC

## 2022-06-21 NOTE — Progress Notes (Signed)
Patient ID: Shannon Quinn, female  DOB: 12/07/1970, 51 y.o.   MRN: [id number] Patient Care Team    Relationship Specialty Notifications Start End  Ma Hillock, DO PCP - General Family Medicine  11/03/17   Jerrell Belfast, MD Consulting Physician Otolaryngology  11/03/17   Alda Berthold, DO Consulting Physician Neurology  11/03/17   Netta Cedars, MD Consulting Physician Orthopedic Surgery  06/01/19   Fanny Skates, MD Consulting Physician General Surgery  06/01/19   Irene Shipper, MD Consulting Physician Gastroenterology  05/23/21   [name], MD Attending Physician Cardiology  05/23/21     Chief Complaint  Patient presents with   Annual Exam    cmc    Subjective:  Shannon Quinn is a 51 y.o.  Female  present for cmc. All past medical history, surgical history, allergies, family history, immunizations, medications and social history were updated in the electronic medical record today. All recent labs, ED visits and hospitalizations within the last year were reviewed.  Health maintenance:  Colonoscopy: 06/2019. 10 yr follow up Dr. Henrene Pastor. Mammogram: completed 12/2021> ordered for [facility]  Cervical cancer screening: last pap: 12/2016, results: Dr. Gertie Fey desires referral to [facility] area gyn Immunizations: tdap updated today, Influenza UTD updated today encouraged yearly), shingrix series completed. Covid series completed Infectious disease screening: HIV completed. Hep C agreeable to screening today. DEXA: routine screen  Anxiety and depression/Hot flashes due to menopause Pt reports she is feeling well on effexor 150 mg QD- no complaints.     Hypertension/Fhx heart disease Pt reports compliance with HCTZ.  Patient unfortunately encountered myalgias in her hands with use of Crestor low-dose.Patient denies chest pain, shortness of breath, dizziness or lower extremity edema.  Pt does  take  daily baby ASA. Pt was prescribed statin. She and her family have started a  mediterranean diet ant home. RF: HTN, obesity, FH heart disease and MI at 85 in father.   Migraine:   Patient reports her migraines have been well controlled on current regimen.  Migraine free since starting amitriptyline 10 mg qhs on the weekends. . She did not taper up. She feels it helps her sleep, may cause a little drowsiness first in the morning. She only uses Topamax when going through headache flares-since it makes her foggy headed.      06/21/2022   10:50 AM 01/04/2022    3:05 PM 11/15/2021    9:07 AM 05/23/2021   10:04 AM 12/29/2020    2:41 PM  Depression screen PHQ 2/9  Decreased Interest '1 1 2 1 '$ 0  Down, Depressed, Hopeless '1 1 2  '$ 0  PHQ - 2 Score '2 2 4 1 '$ 0  Altered sleeping  2 3 0   Tired, decreased energy '2 3 2 2   '$ Change in appetite 0 0 1 0   Feeling bad or failure about yourself  0 0 0 1   Trouble concentrating '3 1 2 1   '$ Moving slowly or fidgety/restless '1 1 2 1   '$ Suicidal thoughts 0 0 0 0   PHQ-9 Score  '9 14 6       '$ 06/21/2022   10:50 AM 01/04/2022    3:06 PM 05/23/2021   10:05 AM 11/17/2020   10:50 AM  GAD 7 : Generalized Anxiety Score  Nervous, Anxious, on Edge '1 1 2 1  '$ Control/stop worrying '1 1 2 '$ 0  Worry too much - different things '1 1 2 1  '$ Trouble relaxing 1 0 2 1  Restless 1 0 2 0  Easily annoyed or irritable '1 1 1 '$ 0  Afraid - awful might happen 1 1 0 1  Total GAD 7 Score '7 5 11 4    '$ Immunization History  Administered Date(s) Administered   Influenza,inj,Quad PF,6+ Mos 04/26/2013, 06/09/2016, 05/24/2019, 05/30/2020, 05/23/2021   Influenza,inj,quad, With Preservative 05/17/2019   PFIZER(Purple Top)SARS-COV-2 Vaccination 02/05/2020, 02/22/2020   Tdap 05/25/2012   Zoster Recombinat (Shingrix) 06/01/2019, 11/05/2019    Past Medical History:  Diagnosis Date   Abnormality of right breast on screening mammogram 10/09/2018   Allergy    Atypical chest pain 06/07/2019   Claudication in peripheral vascular disease ([place]) 07/10/2021   Colon polyps 2011    3 polpys removed   Complication of anesthesia    "severe Reaction" "Physically ill"    Deviated septum    Family history of adverse reaction to anesthesia    violent when waking up from anesthesia - very sick   Hepatic cyst    chronic- stable by yearly PET performed by onc   Hypertension    Hypoglycemic syndrome 1996   during pregnancy   Hypothyroidism    Impingement syndrome of right shoulder region 06/04/2018   Lymphedema of right arm    Melanoma ([place])    03/10/2018   PONV (postoperative nausea and vomiting)    Shoulder injury    right - side happened from last surgery   Thyroid disease    Varicose vein of leg    Allergies  Allergen Reactions   Chlorhexidine Other (See Comments)    Red and purple burns and peeled skin. Cannot use surgical glue.  Pt states Betadine is safe and without reaction.   Crestor [Rosuvastatin] Swelling    Swelling in her hands   Neomycin     UNSPECIFIED REACTION    Other Other (See Comments)    Body glue, turns skin blue   Sulfamethoxazole-Trimethoprim     UNSPECIFIED REACTION    Sulfasalazine Other (See Comments)   Sulfonamide Derivatives     UNSPECIFIED REACTION    Augmentin [Amoxicillin-Pot Clavulanate] Nausea And Vomiting    amox ok.    Hydrocodone Nausea And Vomiting    Severe vomiting   Oxycodone Nausea And Vomiting    Severe vomiting   Prednisone Other (See Comments)    insomnia   Past Surgical History:  Procedure Laterality Date   BREAST LUMPECTOMY WITH RADIOACTIVE SEED LOCALIZATION Right 10/09/2018   Procedure: RIGHT BREAST LUMPECTOMY WITH RADIOACTIVE SEED LOCALIZATION ERAS PATHWAY;  Surgeon: Fanny Skates, MD;  Location: [place];  Service: General;  Laterality: Right;   COLONOSCOPY     polyps   ENDOVENOUS ABLATION SAPHENOUS VEIN W/ LASER Left 01/31/2022   endovenous laser ablation left greater saphenous vein and stab phlebectomy<10 incisions left leg by Harold Barban MD   LAPAROSCOPIC TOTAL HYSTERECTOMY   09/04/2017   Dr. Gaetano Net for endometriosis   MELANOMA EXCISION WITH SENTINEL LYMPH NODE BIOPSY Left 04/02/2018   Procedure: INJECT BLUE DYE MELANOMA LEFT BACK WITH  WIDE LOCAL EXCISION MELANOMA LEFT BACK BILATERAL AXILLARY DEEP SENTINEL LYMPH NODE BIOPSY;  Surgeon: Fanny Skates, MD;  Location: [place];  Service: General;  Laterality: Left;   TUBAL LIGATION     WISDOM TOOTH EXTRACTION     Family History  Problem Relation Age of Onset   Arthritis Mother    Dementia Mother 66   Stroke Mother    Alcohol abuse Father    Arthritis Father  Hyperlipidemia Father    Stroke Father        2012   Depression Father    Diabetes Father    Heart disease Father 46   Heart failure Father    Heart attack Father    Diabetes Sister    CAD Maternal Uncle    Colon cancer Maternal Uncle    Cancer Maternal Uncle    CAD Paternal Aunt    Congestive Heart Failure Paternal Aunt    Kidney failure Paternal Aunt    Cancer Paternal Uncle        breast cancer   Colon cancer Paternal Uncle    Breast cancer Maternal Grandmother 38   Dementia Maternal Grandmother    Heart disease Maternal Grandfather 74   CAD Maternal Grandfather    Diabetes Paternal Grandmother    COPD Paternal Grandfather        emphysema   Esophageal cancer Neg Hx    Stomach cancer Neg Hx    Rectal cancer Neg Hx    Social History   Social History Narrative   Regular exercise: walking 1 mile per day   Caffeine use: soda drinker   Lives with husband and 2 children in a one story home.     Works for [employer].  She works in Science writer   Education: associates degree.      Allergies as of 06/21/2022       Reactions   Chlorhexidine Other (See Comments)   Red and purple burns and peeled skin. Cannot use surgical glue.  Pt states Betadine is safe and without reaction.   Crestor [rosuvastatin] Swelling   Swelling in her hands   Neomycin    UNSPECIFIED REACTION    Other  Other (See Comments)   Body glue, turns skin blue   Sulfamethoxazole-trimethoprim    UNSPECIFIED REACTION    Sulfasalazine Other (See Comments)   Sulfonamide Derivatives    UNSPECIFIED REACTION    Augmentin [amoxicillin-pot Clavulanate] Nausea And Vomiting   amox ok.    Hydrocodone Nausea And Vomiting   Severe vomiting   Oxycodone Nausea And Vomiting   Severe vomiting   Prednisone Other (See Comments)   insomnia        Medication List        Accurate as of June 21, 2022 10:58 AM. If you have any questions, ask your nurse or doctor.          STOP taking these medications    gabapentin 100 MG capsule Commonly known as: NEURONTIN Stopped by: Howard Pouch, DO       TAKE these medications    amitriptyline 10 MG tablet Commonly known as: ELAVIL Take 1 tablet (10 mg total) by mouth at bedtime. What changed: additional instructions   aspirin EC 81 MG tablet Take 1 tablet (81 mg total) by mouth daily.   Excedrin Migraine 250-250-65 MG tablet Generic drug: aspirin-acetaminophen-caffeine Take 1-2 tablets by mouth every 6 (six) hours as needed for headache or migraine.   ezetimibe 10 MG tablet Commonly known as: ZETIA Take 1 tablet (10 mg total) by mouth daily.   fluticasone 50 MCG/ACT nasal spray Commonly known as: FLONASE Place 2 sprays into both nostrils daily. What changed: how much to take   hydrochlorothiazide 25 MG tablet Commonly known as: HYDRODIURIL Take 1 tablet (25 mg total) by mouth daily.   hydrOXYzine 10 MG tablet Commonly known as: ATARAX Take 1-3 tablets (10-30 mg total) by mouth 2 (  two) times daily as needed.   Omega-3 1000 MG Caps Take 2,000 mg by mouth.   ondansetron 4 MG tablet Commonly known as: ZOFRAN Take 1-2 tablets (4-8 mg total) by mouth every 8 (eight) hours as needed for nausea or vomiting.   OVER THE COUNTER MEDICATION Allergy medication   rizatriptan 5 MG tablet Commonly known as: Maxalt Take 1 tablet (5 mg total)  by mouth as needed for migraine. May repeat in 2 hours if needed   topiramate 50 MG tablet Commonly known as: TOPAMAX Take 1 tablet (50 mg total) by mouth daily.   venlafaxine XR 150 MG 24 hr capsule Commonly known as: EFFEXOR-XR TAKE 1 CAPSULE BY MOUTH EVERY DAY        All past medical history, surgical history, allergies, family history, immunizations andmedications were updated in the EMR today and reviewed under the history and medication portions of their EMR.      ROS: 14 pt review of systems performed and negative (unless mentioned in an HPI)  Objective: BP 121/80   Pulse 63   Temp 98 F (36.7 C)   Ht '5\' 5"'$  (1.651 m)   Wt 254 lb 9.6 oz (115.5 kg)   LMP 08/23/2017   SpO2 96%   BMI 42.37 kg/m  Physical Exam Vitals and nursing note reviewed.  Constitutional:      General: She is not in acute distress.    Appearance: Normal appearance. She is obese. She is not ill-appearing or toxic-appearing.  HENT:     Head: Normocephalic and atraumatic.     Right Ear: Tympanic membrane, ear canal and external ear normal. There is no impacted cerumen.     Left Ear: Tympanic membrane, ear canal and external ear normal. There is no impacted cerumen.     Nose: No congestion or rhinorrhea.     Mouth/Throat:     Mouth: Mucous membranes are moist.     Pharynx: Oropharynx is clear. No oropharyngeal exudate or posterior oropharyngeal erythema.  Eyes:     General: No scleral icterus.       Right eye: No discharge.        Left eye: No discharge.     Extraocular Movements: Extraocular movements intact.     Conjunctiva/sclera: Conjunctivae normal.     Pupils: Pupils are equal, round, and reactive to light.  Cardiovascular:     Rate and Rhythm: Normal rate and regular rhythm.     Pulses: Normal pulses.     Heart sounds: Normal heart sounds. No murmur heard.    No friction rub. No gallop.  Pulmonary:     Effort: Pulmonary effort is normal. No respiratory distress.     Breath sounds:  Normal breath sounds. No stridor. No wheezing, rhonchi or rales.  Chest:     Chest wall: No tenderness.  Abdominal:     General: Abdomen is flat. Bowel sounds are normal. There is no distension.     Palpations: Abdomen is soft. There is no mass.     Tenderness: There is no abdominal tenderness. There is no right CVA tenderness, left CVA tenderness, guarding or rebound.     Hernia: No hernia is present.  Musculoskeletal:        General: No swelling, tenderness or deformity. Normal range of motion.     Cervical back: Normal range of motion and neck supple. No rigidity or tenderness.     Right lower leg: No edema.     Left lower leg: No edema.  Lymphadenopathy:     Cervical: No cervical adenopathy.  Skin:    General: Skin is warm and dry.     Coloration: Skin is not jaundiced or pale.     Findings: No bruising, erythema, lesion or rash.  Neurological:     General: No focal deficit present.     Mental Status: She is alert and oriented to person, place, and time. Mental status is at baseline.     Cranial Nerves: No cranial nerve deficit.     Sensory: No sensory deficit.     Motor: No weakness.     Coordination: Coordination normal.     Gait: Gait normal.     Deep Tendon Reflexes: Reflexes normal.  Psychiatric:        Mood and Affect: Mood normal.        Behavior: Behavior normal.        Thought Content: Thought content normal.        Judgment: Judgment normal.     No results found.  Assessment/plan: Bronda Alfred is a 51 y.o. female present for CPE/cmc Migraine: stable continue amitriptyline 10 mg (only on weekends-pt preference)  Continue Maxalt as needed Continue   Vistaril as needed Continue  Topamax if seeing increase in flare she will restart as. continue Zofran 4 mg every 8 hours as needed   Anxiety and depression/Hot flashes due to menopause stable Continue effexor 150 mg qd  Hypertension/Fhx heart disease/Family history of heart disease/Coronary arteriosclerosis  in native artery/Morbid obesity ([place]):  Stable Continue  hctz 25 Unable to tolerate Crestor. Taking zetia Cbc, cmp, tsh, lipids collected today  Melanoma of skin ([place]) Continue to follow with dermatology Hypothyroidism due to acquired atrophy of thyroid - TSH Encounter for long-term current use of medication - Hemoglobin A1c Encounter to establish care - Ambulatory referral to Gynecology Need for Tdap vaccination/Flu vaccine need Influenza provided today Tdap provided today Breast cancer screening by mammogram - MM 3D SCREEN BREAST BILATERAL; Future Routine general medical examination at a health care facility Patient was encouraged to exercise greater than 150 minutes a week. Patient was encouraged to choose a diet filled with fresh fruits and vegetables, and lean meats. AVS provided to patient today for education/recommendation on gender specific health and safety maintenance. Colonoscopy: 06/2019. 10 yr follow up Dr. Henrene Pastor. Mammogram: completed 12/2021> ordered for [facility]  Cervical cancer screening: last pap: 12/2016, results: Dr. Gertie Fey desires referral to [facility] area gyn Immunizations: tdap updated today, Influenza UTD updated today ncouraged yearly), shingrix series completed. Covid series completed Infectious disease screening: HIV completed. Hep C agreeable to screening today. DEXA: routine screen Return in about 24 weeks (around 12/06/2022) for Routine chronic condition follow-up.  Orders Placed This Encounter  Procedures   CBC with Differential/Platelet   Hemoglobin A1c   Comprehensive metabolic panel   TSH    Orders Placed This Encounter  Procedures   CBC with Differential/Platelet   Hemoglobin A1c   Comprehensive metabolic panel   TSH   No orders of the defined types were placed in this encounter.  Referral Orders  No referral(s) requested today     Electronically signed by: Howard Pouch, [place]

## 2022-06-21 NOTE — Patient Instructions (Signed)
Return in about 24 weeks (around 12/06/2022) for Routine chronic condition follow-up.        Great to see you today.  I have refilled the medication(s) we provide.   If labs were collected, we will inform you of lab results once received either by echart message or telephone call.   - echart message- for normal results that have been seen by the patient already.   - telephone call: abnormal results or if patient has not viewed results in their echart. Health Maintenance, Female Adopting a healthy lifestyle and getting preventive care are important in promoting health and wellness. Ask your health care provider about: The right schedule for you to have regular tests and exams. Things you can do on your own to prevent diseases and keep yourself healthy. What should I know about diet, weight, and exercise? Eat a healthy diet  Eat a diet that includes plenty of vegetables, fruits, low-fat dairy products, and lean protein. Do not eat a lot of foods that are high in solid fats, added sugars, or sodium. Maintain a healthy weight Body mass index (BMI) is used to identify weight problems. It estimates body fat based on height and weight. Your health care provider can help determine your BMI and help you achieve or maintain a healthy weight. Get regular exercise Get regular exercise. This is one of the most important things you can do for your health. Most adults should: Exercise for at least 150 minutes each week. The exercise should increase your heart rate and make you sweat (moderate-intensity exercise). Do strengthening exercises at least twice a week. This is in addition to the moderate-intensity exercise. Spend less time sitting. Even light physical activity can be beneficial. Watch cholesterol and blood lipids Have your blood tested for lipids and cholesterol at 53 years of age, then have this test every 5 years. Have your cholesterol levels checked more often if: Your lipid or  cholesterol levels are high. You are older than 53 years of age. You are at high risk for heart disease. What should I know about cancer screening? Depending on your health history and family history, you may need to have cancer screening at various ages. This may include screening for: Breast cancer. Cervical cancer. Colorectal cancer. Skin cancer. Lung cancer. What should I know about heart disease, diabetes, and high blood pressure? Blood pressure and heart disease High blood pressure causes heart disease and increases the risk of stroke. This is more likely to develop in people who have high blood pressure readings or are overweight. Have your blood pressure checked: Every 3-5 years if you are 17-69 years of age. Every year if you are 40 years old or older. Diabetes Have regular diabetes screenings. This checks your fasting blood sugar level. Have the screening done: Once every three years after age 67 if you are at a normal weight and have a low risk for diabetes. More often and at a younger age if you are overweight or have a high risk for diabetes. What should I know about preventing infection? Hepatitis B If you have a higher risk for hepatitis B, you should be screened for this virus. Talk with your health care provider to find out if you are at risk for hepatitis B infection. Hepatitis C Testing is recommended for: Everyone born from [birth year] through 1967. Anyone with known risk factors for hepatitis C. Sexually transmitted infections (STIs) Get screened for STIs, including gonorrhea and chlamydia, if: You are sexually active and are younger  than 53 years of age. You are older than 53 years of age and your health care provider tells you that you are at risk for this type of infection. Your sexual activity has changed since you were last screened, and you are at increased risk for chlamydia or gonorrhea. Ask your health care provider if you are at risk. Ask your health care  provider about whether you are at high risk for HIV. Your health care provider may recommend a prescription medicine to help prevent HIV infection. If you choose to take medicine to prevent HIV, you should first get tested for HIV. You should then be tested every 3 months for as long as you are taking the medicine. Pregnancy If you are about to stop having your period (premenopausal) and you may become pregnant, seek counseling before you get pregnant. Take 400 to 800 micrograms (mcg) of folic acid every day if you become pregnant. Ask for birth control (contraception) if you want to prevent pregnancy. Osteoporosis and menopause Osteoporosis is a disease in which the bones lose minerals and strength with aging. This can result in bone fractures. If you are 43 years old or older, or if you are at risk for osteoporosis and fractures, ask your health care provider if you should: Be screened for bone loss. Take a calcium or vitamin D supplement to lower your risk of fractures. Be given hormone replacement therapy (HRT) to treat symptoms of menopause. Follow these instructions at home: Alcohol use Do not drink alcohol if: Your health care provider tells you not to drink. You are pregnant, may be pregnant, or are planning to become pregnant. If you drink alcohol: Limit how much you have to: 0-1 drink a day. Know how much alcohol is in your drink. In the [place] one drink equals one 12 oz bottle of beer (355 mL), one 5 oz glass of wine (148 mL), or one 1 oz glass of hard liquor (44 mL). Lifestyle Do not use any products that contain nicotine or tobacco. These products include cigarettes, chewing tobacco, and vaping devices, such as e-cigarettes. If you need help quitting, ask your health care provider. Do not use street drugs. Do not share needles. Ask your health care provider for help if you need support or information about quitting drugs. General instructions Schedule regular health, dental, and  eye exams. Stay current with your vaccines. Tell your health care provider if: You often feel depressed. You have ever been abused or do not feel safe at home. Summary Adopting a healthy lifestyle and getting preventive care are important in promoting health and wellness. Follow your health care provider's instructions about healthy diet, exercising, and getting tested or screened for diseases. Follow your health care provider's instructions on monitoring your cholesterol and blood pressure. This information is not intended to replace advice given to you by your health care provider. Make sure you discuss any questions you have with your health care provider. Document Revised: 12/18/2020 Document Reviewed: 12/18/2020 Elsevier Patient Education  [place].

## 2022-06-25 DIAGNOSIS — G4733 Obstructive sleep apnea (adult) (pediatric): Secondary | ICD-10-CM | POA: Diagnosis not present

## 2022-07-03 ENCOUNTER — Encounter: Payer: Self-pay | Admitting: Radiology

## 2022-07-03 ENCOUNTER — Ambulatory Visit: Payer: BC Managed Care – PPO | Admitting: Family Medicine

## 2022-07-03 ENCOUNTER — Ambulatory Visit (INDEPENDENT_AMBULATORY_CARE_PROVIDER_SITE_OTHER): Payer: BC Managed Care – PPO | Admitting: Radiology

## 2022-07-03 VITALS — BP 114/72 | Ht 64.5 in | Wt 255.0 lb

## 2022-07-03 DIAGNOSIS — N762 Acute vulvitis: Secondary | ICD-10-CM | POA: Diagnosis not present

## 2022-07-03 DIAGNOSIS — Z01419 Encounter for gynecological examination (general) (routine) without abnormal findings: Secondary | ICD-10-CM | POA: Diagnosis not present

## 2022-07-03 MED ORDER — NYSTATIN-TRIAMCINOLONE 100000-0.1 UNIT/GM-% EX OINT: 1.0000 | TOPICAL_OINTMENT | Freq: Two times a day (BID) | CUTANEOUS | 0 refills | Status: DC

## 2022-07-03 NOTE — Progress Notes (Signed)
   Shannon Quinn 06/02/1971 [id number]   History:  51 y.o. G3P2 presents for annual exam as a new patient. Transfer from [facility], Dr Gaetano Net. S/p hyst/BSO 2019 for endometriosis. No hx of abn paps. Labs with PCP.  Gynecologic History Hysterectomy: 2019  Sexually active: yes  Health Maintenance Last Pap: 2018.  Last mammogram: 2023 Last colonoscopy: 2020   Past medical history, past surgical history, family history and social history were all reviewed and documented in the EPIC chart.  ROS:  A ROS was performed and pertinent positives and negatives are included.  Exam:  Vitals:   07/03/22 1004  BP: 114/72  Weight: 255 lb (115.7 kg)  Height: 5' 4.5" (1.638 m)   Body mass index is 43.09 kg/m.  General appearance:  Normal Thyroid:  Symmetrical, normal in size, without palpable masses or nodularity. Respiratory  Auscultation:  Clear without wheezing or rhonchi Cardiovascular  Auscultation:  Regular rate, without rubs, murmurs or gallops  Edema/varicosities:  Not grossly evident Abdominal  Soft,nontender, without masses, guarding or rebound.  Liver/spleen:  No organomegaly noted  Hernia:  None appreciated  Skin  Inspection:  Grossly normal Breasts: Examined lying and sitting.   Right: Without masses, retractions, nipple discharge or axillary adenopathy.   Left: Without masses, retractions, nipple discharge or axillary adenopathy. Genitourinary   Inguinal/mons:  Normal without inguinal adenopathy  External genitalia:  erythema  BUS/Urethra/Skene's glands:  Normal  Vagina:  Normal appearing with normal color and discharge, no lesions. Atrophy mild  Cervix:  absent  Uterus:  absent  Adnexa/parametria:  absent  Anus and perineum: Normal  Digital rectal exam: Normal sphincter tone without palpated masses or tenderness  Patient informed chaperone available to be present for breast and pelvic exam. Patient has requested no chaperone to be present. Patient has been advised what  will be completed during breast and pelvic exam.   Assessment/Plan:   1. Well woman exam with routine gynecological exam Paps may be d/c'd  2. Acute vulvitis  Mycolog BID rx sent   Discussed SBE, colonoscopy and DEXA screening as appropriate. Encouraged 150mns/week of cardiovascular and weight bearing exercise minimum. Recommend the use of seatbelts and sunscreen consistently.   Return in 1 year for annual or sooner prn.  CRubbie BattiestB WHNP-BC 11:10 AM 07/03/2022

## 2022-07-12 ENCOUNTER — Ambulatory Visit: Payer: BC Managed Care – PPO | Attending: Cardiovascular Disease | Admitting: Cardiovascular Disease

## 2022-07-12 ENCOUNTER — Encounter: Payer: Self-pay | Admitting: Cardiovascular Disease

## 2022-07-12 ENCOUNTER — Ambulatory Visit: Payer: BC Managed Care – PPO | Admitting: Family Medicine

## 2022-07-12 VITALS — BP 139/86 | HR 69 | Ht 64.0 in | Wt 260.4 lb

## 2022-07-12 DIAGNOSIS — Z6841 Body Mass Index (BMI) 40.0 and over, adult: Secondary | ICD-10-CM

## 2022-07-12 DIAGNOSIS — I10 Essential (primary) hypertension: Secondary | ICD-10-CM | POA: Diagnosis not present

## 2022-07-12 DIAGNOSIS — I739 Peripheral vascular disease, unspecified: Secondary | ICD-10-CM

## 2022-07-12 DIAGNOSIS — I2510 Atherosclerotic heart disease of native coronary artery without angina pectoris: Secondary | ICD-10-CM | POA: Diagnosis not present

## 2022-07-12 DIAGNOSIS — E785 Hyperlipidemia, unspecified: Secondary | ICD-10-CM

## 2022-07-12 DIAGNOSIS — G4733 Obstructive sleep apnea (adult) (pediatric): Secondary | ICD-10-CM

## 2022-07-12 NOTE — Patient Instructions (Signed)
Medication Instructions:  The current medical regimen is effective;  continue present plan and medications.  *If you need a refill on your cardiac medications before your next appointment, please call your pharmacy*   Follow-Up: At [facility], you and your health needs are our priority.  As part of our continuing mission to provide you with exceptional heart care, we have created designated Provider Care Teams.  These Care Teams include your primary Cardiologist (physician) and Advanced Practice Providers (APPs -  Physician Assistants and Nurse Practitioners) who all work together to provide you with the care you need, when you need it.  We recommend signing up for the patient portal called "MyChart".  Sign up information is provided on this After Visit Summary.  MyChart is used to connect with patients for Virtual Visits (Telemedicine).  Patients are able to view lab/test results, encounter notes, upcoming appointments, etc.  Non-urgent messages can be sent to your provider as well.   To learn more about what you can do with MyChart, go to [email or web address].    Your next appointment:   12 month(s)  The format for your next appointment:   In Person  Provider:   Shelva Majestic, MD

## 2022-07-12 NOTE — Progress Notes (Signed)
Cardiology Office Note    Date:  07/12/2022   ID:  Shannon Quinn, DOB September 26, 1970, MRN [id number]  PCP:  Ma Hillock, DO  Cardiologist:  Shelva Majestic, MD (sleep); Dr. [name]  New sleep consult   History of Present Illness:  Shannon Quinn is a 51 y.o. female who is followed by Dr. [name] for cardiology care.  He has a history of hypertension, hyperlipidemia, well-developed melanoma status post lymph node resections, and was referred by Sande Rives, PA-C fo a sleep study due to concerns for obstructive sleep apnea with symptoms of snoring, nonrestorative sleep, fatigue, and witnessed apnea.  She underwent a home sleep study on February 18, 2021.  She was found to have moderate overall sleep apnea with an AHI of 20.3/h.  She had severe oxygen desaturation to a nadir of 75%.  Referred for CPAP titration study which was done on May 13, 2021 CPAP was initiated at 7 cm and was titrated to 11 cm of water.  At 11 cm AHI was 0 but REM sleep was absent throughout the study.  CPAP therapy minimum O2 saturation was 90%.  Due to supply chain issues, she did not receive a CPAP machine until March 25, 2022 and received a ResMed AirSense 11 AutoSet unit.  Since she did not have REM sleep on her titration study I had initially recommended a pressure range of 10-16 rather than a set pressure of 11.  Using CPAP therapy is March 25, 2022.  Since initiation, compliance has been excellent with 100% use.  She typically goes to bed between 10 and 11 PM and wakes up around 7 AM.  He notes her sleep is much more restorative.  She no longer has morning headaches.  Denies residual daytime sleepiness.  Has been using a fullface mask.  He is mild nocturia has essentially resolved.  She is deriving significant benefit from therapy.  She presents for initial sleep evaluation and consultation.   Past Medical History:  Diagnosis Date   Abnormality of right breast on screening mammogram 10/09/2018    Allergy    Atypical chest pain 06/07/2019   Atypical migraine    Claudication in peripheral vascular disease ([place]) 07/10/2021   Colon polyps 2011   3 polpys removed   Complication of anesthesia    "severe Reaction" "Physically ill"    Deviated septum    Endometriosis    Family history of adverse reaction to anesthesia    violent when waking up from anesthesia - very sick   Hepatic cyst    chronic- stable by yearly PET performed by onc   Hypertension    Hypoglycemic syndrome 1996   during pregnancy   Hypothyroidism    Impingement syndrome of right shoulder region 06/04/2018   Kidney stone    Lymphedema of right arm    Melanoma ([employer])    03/10/2018   PONV (postoperative nausea and vomiting)    Shoulder injury    right - side happened from last surgery   Thyroid disease    Varicose vein of leg     Past Surgical History:  Procedure Laterality Date   BREAST LUMPECTOMY WITH RADIOACTIVE SEED LOCALIZATION Right 10/09/2018   Procedure: RIGHT BREAST LUMPECTOMY [place];  Surgeon: Fanny Skates, MD;  Location: [facility];  Service: General;  Laterality: Right;   COLONOSCOPY     polyps   ENDOVENOUS ABLATION SAPHENOUS VEIN W/ LASER Left 01/31/2022  endovenous laser ablation left greater saphenous vein and stab phlebectomy<10 incisions left leg by Harold Barban MD   LAPAROSCOPIC TOTAL HYSTERECTOMY  09/04/2017   Dr. Gaetano Net for endometriosis   MELANOMA EXCISION WITH SENTINEL LYMPH NODE BIOPSY Left 04/02/2018   Procedure: INJECT BLUE DYE MELANOMA LEFT BACK WITH  WIDE LOCAL EXCISION MELANOMA LEFT BACK BILATERAL AXILLARY DEEP SENTINEL LYMPH NODE BIOPSY;  Surgeon: Fanny Skates, MD;  Location: [facility];  Service: General;  Laterality: Left;   TUBAL LIGATION     WISDOM TOOTH EXTRACTION      Current Medications: Outpatient Medications Prior to Visit  Medication Sig Dispense Refill   amitriptyline (ELAVIL) 10 MG  tablet Take 1 tablet (10 mg total) by mouth at bedtime as needed for sleep. 24 tablet 1   aspirin EC 81 MG tablet Take 1 tablet (81 mg total) by mouth daily. 90 tablet 3   aspirin-acetaminophen-caffeine (EXCEDRIN MIGRAINE) 250-250-65 MG tablet Take 1-2 tablets by mouth every 6 (six) hours as needed for headache or migraine.     ezetimibe (ZETIA) 10 MG tablet Take 1 tablet (10 mg total) by mouth daily. 90 tablet 3   fluticasone (FLONASE) 50 MCG/ACT nasal spray Place 2 sprays into both nostrils daily. (Patient taking differently: Place 1 spray into both nostrils daily.) 16 g 11   hydrochlorothiazide (HYDRODIURIL) 25 MG tablet Take 1 tablet (25 mg total) by mouth daily. 90 tablet 1   hydrOXYzine (ATARAX) 10 MG tablet Take 1-3 tablets (10-30 mg total) by mouth 2 (two) times daily as needed. 90 tablet 5   nystatin-triamcinolone ointment (MYCOLOG) Apply 1 Application topically 2 (two) times daily. 30 g 0   Omega-3 1000 MG CAPS Take 2,000 mg by mouth.     ondansetron (ZOFRAN) 4 MG tablet Take 1-2 tablets (4-8 mg total) by mouth every 8 (eight) hours as needed for nausea or vomiting. 40 tablet 5   OVER THE COUNTER MEDICATION Allergy medication     rizatriptan (MAXALT) 5 MG tablet Take 1 tablet (5 mg total) by mouth as needed for migraine. May repeat in 2 hours if needed 10 tablet 5   topiramate (TOPAMAX) 50 MG tablet Take 1 tablet (50 mg total) by mouth daily. 90 tablet 1   venlafaxine XR (EFFEXOR-XR) 150 MG 24 hr capsule TAKE 1 CAPSULE BY MOUTH EVERY DAY 90 capsule 1   No facility-administered medications prior to visit.     Allergies:   Chlorhexidine, Crestor [rosuvastatin], Neomycin, Other, Sulfamethoxazole-trimethoprim, Sulfasalazine, Sulfonamide derivatives, Augmentin [amoxicillin-pot clavulanate], Hydrocodone, Oxycodone, and Prednisone   Social History   Socioeconomic History   Marital status: Married    Spouse name: Not on file   Number of children: 2   Years of education: Not on file    Highest education level: Not on file  Occupational History   Not on file  Tobacco Use   Smoking status: Never    Passive exposure: Past   Smokeless tobacco: Never  Vaping Use   Vaping Use: Never used  Substance and Sexual Activity   Alcohol use: Yes    Alcohol/week: 0.0 standard drinks of alcohol    Comment: rarely   Drug use: No   Sexual activity: Yes    Partners: Male    Birth control/protection: Surgical    Comment: Hysterectomy, menarche 51yo, sexual debut 51yo  Other Topics Concern   Not on file  Social History Narrative   Regular exercise: walking 1 mile per day   Caffeine use: soda drinker  Lives with husband and 2 children in a one story home.     Works for [employer].  She works in Science writer   Education: associates degree.     Social Determinants of Health   Financial Resource Strain: Not on file  Food Insecurity: Not on file  Transportation Needs: Not on file  Physical Activity: Not on file  Stress: Not on file  Social Connections: Not on file    Socially, she was born in [place].  She attended [employer] for college.  She currently works in Art therapist at [employer].  She is married for 31 years and has 2 children ages 56 and 37.  No tobacco history.  Family History:  The patient's family history includes Alcohol abuse in her father; Arthritis in her father and mother; Breast cancer (age of onset: 24) in her maternal grandmother; CAD in her maternal grandfather, maternal uncle, and paternal aunt; COPD in her paternal grandfather; Cancer in her maternal uncle and paternal uncle; Colon cancer in her maternal uncle and paternal uncle; Congestive Heart Failure in her paternal aunt; Dementia in her maternal grandmother; Dementia (age of onset: 20) in her mother; Depression in her father; Diabetes in her father, paternal aunt, paternal grandmother, and sister; Heart attack in her father; Heart disease (age of onset: 20) in  her maternal grandfather; Heart disease (age of onset: 34) in her father; Heart failure in her father; Hyperlipidemia in her father; Kidney failure in her paternal aunt; Osteoporosis in her mother; Stroke in her father and mother.  Her mother is alive at age 59.  Father died of kidney failure.  She has 2 older sisters ages 76 and 4.  ROS General: Negative; No fevers, chills, or night sweats;  HEENT: Negative; No changes in vision or hearing, sinus congestion, difficulty swallowing Pulmonary: Negative; No cough, wheezing, shortness of breath, hemoptysis Cardiovascular: Negative; No chest pain, presyncope, syncope, palpitations GI: Negative; No nausea, vomiting, diarrhea, or abdominal pain GU: Negative; No dysuria, hematuria, or difficulty voiding Musculoskeletal: Negative; no myalgias, joint pain, or weakness Hematologic/Oncology: Negative; no easy bruising, bleeding Endocrine: Negative; no heat/cold intolerance; no diabetes Neuro: Negative; no changes in balance, headaches Skin: Negative; No rashes or skin lesions Psychiatric: Negative; No behavioral problems, depression Sleep: Negative; No snoring, daytime sleepiness, hypersomnolence, bruxism, restless legs, hypnogognic hallucinations, no cataplexy Other comprehensive 14 point system review is negative.   PHYSICAL EXAM:   VS:  BP 139/86 (BP Location: Left Arm, Patient Position: Sitting, Cuff Size: Large)   Pulse 69   Ht _0  (1.626 m)   Wt 260 lb 6.4 oz (118.1 kg)   LMP 08/23/2017   SpO2 100%   BMI 44.70 kg/m     Repeat blood pressure by me was 126/82.  Wt Readings from Last 3 Encounters:  07/12/22 260 lb 6.4 oz (118.1 kg)  07/03/22 255 lb (115.7 kg)  06/21/22 254 lb 9.6 oz (115.5 kg)    General: Alert, oriented, no distress. BMI consistent with morbid obesity Skin: normal turgor, no rashes, warm and dry HEENT: Normocephalic, atraumatic. Pupils equal round and reactive to light; sclera anicteric; extraocular muscles  intact; Nose without nasal septal hypertrophy Mouth/Parynx benign; Mallinpatti scale 3 Neck: Intact; no JVD, no carotid bruits; normal carotid upstroke Lungs: clear to ausculatation and percussion; no wheezing or rales Chest wall: without tenderness to palpitation Heart: PMI not displaced, RRR, s1 s2 normal, 1/6 systolic murmur, no diastolic murmur, no rubs, gallops, thrills,  or heaves Abdomen: Central adiposity; soft, nontender; no hepatosplenomehaly, BS+; abdominal aorta nontender and not dilated by palpation. Back: no CVA tenderness Pulses 2+ Musculoskeletal: full range of motion, normal strength, no joint deformities Extremities: no clubbing cyanosis or edema, Homan's sign negative  Neurologic: grossly nonfocal; Cranial nerves grossly wnl Psychologic: Normal mood and affect   Studies/Labs Reviewed:   July 12, 2022 ECG (independently read by me): NSR at 69, low voltage, PRWP  Recent Labs:    Latest Ref Rng & Units 06/21/2022   10:34 AM 02/28/2022    5:40 PM 01/17/2022    9:01 AM  BMP  Glucose 70 - 99 mg/dL 105  109  108   BUN 6 - 23 mg/dL _0 Creatinine 0.40 - 1.20 mg/dL 0.98  0.82  0.99   Sodium 135 - 145 mEq/L 141  141  140   Potassium 3.5 - 5.1 mEq/L 4.0  3.8  3.9   Chloride 96 - 112 mEq/L 107  108  109   CO2 19 - 32 mEq/L _1 Calcium 8.4 - 10.5 mg/dL 9.3  8.8  9.6         Latest Ref Rng & Units 06/21/2022   10:34 AM 05/10/2022    8:29 AM 01/17/2022    9:01 AM  Hepatic Function  Total Protein 6.0 - 8.3 g/dL 7.1  7.4  7.2   Albumin 3.5 - 5.2 g/dL 4.0  4.3  4.1   AST 0 - 37 U/L 63  74  60   ALT 0 - 35 U/L 88  89  91   Alk Phosphatase 39 - 117 U/L 80  97  83   Total Bilirubin 0.2 - 1.2 mg/dL 0.4  0.4  0.4   Bilirubin, Direct 0.00 - 0.40 mg/dL  0.12         Latest Ref Rng & Units 06/21/2022   10:34 AM 02/28/2022    5:40 PM 01/17/2022    9:01 AM  CBC  WBC 4.0 - 10.5 K/uL 6.8  6.9  7.1   Hemoglobin 12.0 - 15.0 g/dL 13.5  13.9  14.3   Hematocrit  36.0 - 46.0 % 40.9  43.4  43.1   Platelets 150.0 - 400.0 K/uL 259.0  253  284    Lab Results  Component Value Date   MCV 91.0 06/21/2022   MCV 94.3 02/28/2022   MCV 90.4 01/17/2022   Lab Results  Component Value Date   TSH 2.94 06/21/2022   Lab Results  Component Value Date   HGBA1C 6.0 06/21/2022     BNP No results found for: "BNP"  ProBNP No results found for: "PROBNP"   Lipid Panel     Component Value Date/Time   CHOL 179 05/10/2022 0829   TRIG 100 05/10/2022 0829   HDL 54 05/10/2022 0829   CHOLHDL 3.3 05/10/2022 0829   CHOLHDL 3 05/23/2021 1035   VLDL 26.8 05/23/2021 1035   LDLCALC 107 (H) 05/10/2022 0829   LDLCALC 127 (H) 06/01/2019 1556   LABVLDL 18 05/10/2022 0829     RADIOLOGY: No results found.   Additional studies/ records that were reviewed today include:     Patient Name: Don, Giarrusso Date: 02/18/2021 Gender: Female D.O.B: 1971-04-11 Age (years): 49 Referring Provider: Sande Rives PA-C Height (inches): 65 Interpreting Physician: Shelva Majestic MD, ABSM Weight (lbs): 230 RPSGT: Jacolyn Reedy BMI: 39 MRN: [id number] Neck Size: 15.50   CLINICAL INFORMATION Sleep  Study Type: HST   Indication for sleep study: snoring, non-restorative sleep   Epworth Sleepiness Score: 8   SLEEP STUDY TECHNIQUE A multi-channel overnight portable sleep study was performed. The channels recorded were: nasal airflow, thoracic respiratory movement, and oxygen saturation with a pulse oximetry. Snoring was also monitored.   MEDICATIONS amitriptyline (ELAVIL) 10 MG tablet aspirin EC 81 MG tablet doxycycline (VIBRA-TABS) 100 MG tablet hydrochlorothiazide (HYDRODIURIL) 25 MG tablet hydrOXYzine (ATARAX/VISTARIL) 10 MG tablet Omega-3 1000 MG CAPS ondansetron (ZOFRAN) 4 MG tablet OVER THE COUNTER MEDICATION promethazine (PHENERGAN) 25 MG suppository rizatriptan (MAXALT) 5 MG tablet rosuvastatin (CRESTOR) 10 MG tablet topiramate (TOPAMAX) 50 MG  tablet venlafaxine XR (EFFEXOR-XR) 75 MG 24 hr capsule Patient self administered medications include: N/A.   SLEEP ARCHITECTURE Patient was studied for 363.2 minutes. The sleep efficiency was 100.0 % and the patient was supine for 0%. The arousal index was 0.0 per hour.   RESPIRATORY PARAMETERS The overall AHI was 20.3 per hour, with a central apnea index of 0 per hour.   The oxygen nadir was 75% during sleep.   CARDIAC DATA Mean heart rate during sleep was 62.9 bpm.   IMPRESSIONS - Moderate obstructive sleep apnea occurred during this study (AHI 20.3/h). The severity during REM sleep cannot be assessed on this home study. - Severe oxygen desaturation to a nadir of 75%. - Patient snored 1.6% during the sleep.   DIAGNOSIS - Obstructive Sleep Apnea (G47.33) - Nocturnal Hypoxemia (G47.36)   RECOMMENDATIONS - Recommend a therapeutic CPAP titration study to further evaluate and treat her sleep disorded breathing. If unable to obtain a titration study, initiate Auto-PAP with EPR of 3 at 6 - 16 cm of water. - Effort should be made to optimize nasal and oropharyngeal patency. - Avoid alcohol, sedatives and other CNS depressants that may worsen sleep apnea and disrupt normal sleep architecture. - Sleep hygiene should be reviewed to assess factors that may improve sleep quality. - Weight management (BMI 39) and regular exercise should be initiated or continued. - Recommend a download and sleep clinic evaluation after one month of therapy.      CPAP TITRATION: 05/13/2021 CLINICAL INFORMATION The patient is referred for a CPAP titration to treat sleep apnea.   Date of HST: 02/18/2021:  AHI 20.3/h; O2 nadir 75%.   SLEEP STUDY TECHNIQUE As per the AASM Manual for the Scoring of Sleep and Associated Events v2.3 (April 2016) with a hypopnea requiring 4% desaturations.   The channels recorded and monitored were frontal, central and occipital EEG, electrooculogram (EOG), submentalis EMG  (chin), nasal and oral airflow, thoracic and abdominal wall motion, anterior tibialis EMG, snore microphone, electrocardiogram, and pulse oximetry. Continuous positive airway pressure (CPAP) was initiated at the beginning of the study and titrated to treat sleep-disordered breathing.   MEDICATIONS amitriptyline (ELAVIL) 10 MG tablet aspirin EC 81 MG tablet hydrochlorothiazide (HYDRODIURIL) 25 MG tablet hydrOXYzine (ATARAX/VISTARIL) 10 MG tablet Omega-3 1000 MG CAPS ondansetron (ZOFRAN) 4 MG tablet OVER THE COUNTER MEDICATION promethazine (PHENERGAN) 25 MG suppository rizatriptan (MAXALT) 5 MG tablet topiramate (TOPAMAX) 50 MG tablet venlafaxine XR (EFFEXOR-XR) 75 MG 24 hr capsule  Medications self-administered by patient taken the night of the study : N/A   TECHNICIAN COMMENTS Comments added by technician: Patient had difficulty initiating sleep. Comments added by scorer: N/A   RESPIRATORY PARAMETERS Optimal PAP Pressure (cm):  11        AHI at Optimal Pressure (/hr):  0 Overall Minimal O2 (%):         90.0     Supine % at Optimal Pressure (%):    0 Minimal O2 at Optimal Pressure (%): 90.0        SLEEP ARCHITECTURE The study was initiated at 11:15:00 PM and ended at 5:12:10 AM.   Sleep onset time was 41.6 minutes and the sleep efficiency was 84.7%%. The total sleep time was 302.5 minutes.   The patient spent 6.4%% of the night in stage N1 sleep, 93.6%% in stage N2 sleep, 0.0%% in stage N3 and 0% in REM.Stage REM latency was N/A minutes   Wake after sleep onset was 13.1. Alpha intrusion was absent. Supine sleep was 24.96%.   CARDIAC DATA The 2 lead EKG demonstrated sinus rhythm. The mean heart rate was 58.2 beats per minute. Other EKG findings include: None.   LEG MOVEMENT DATA The total Periodic Limb Movements of Sleep (PLMS) were 0. The PLMS index was 0.0. A PLMS index of <15 is considered normal in adults.   IMPRESSIONS - CPAP was initiated at 7 cm and was  titrated to 11 cm of water. AHI was 0, but REM sleep was absent throughout the study. - Significant oxygen desaturations were not observed during this titration (min O2 90.0%). - The patient snored with moderate to soft snoring volume during this titration study. - No cardiac abnormalities were observed during this study. - Clinically significant periodic limb movements were not noted during this study. Arousals associated with PLMs were rare.   DIAGNOSIS - Obstructive Sleep Apnea (G47.33)   RECOMMENDATIONS - Recommend an initial trial of CPAP Auto therapy with EPR of 3 at 10 - 16 cm H2O with heated humidification.  A Small size Fisher&Paykel Full Face Mask Simplus mask was used for the titration. - Effort should be made to optimize nasal and oropharyngeal patency. - Avoid alcohol, sedatives and other CNS depressants that may worsen sleep apnea and disrupt normal sleep architecture. - Sleep hygiene should be reviewed to assess factors that may improve sleep quality. - Weight management (BMI39) and regular exercise should be initiated or continued. - Recommend a download in 30 days and sleep clinic evaluation after 4 weeks of therapy    ASSESSMENT:    1. OSA (obstructive sleep apnea)   2. CAD in native artery   3. Peripheral vascular disease, unspecified ([place])   4. Primary hypertension   5. Class 3 severe obesity due to excess calories with body mass index (BMI) of 40.0 to 44.9 in adult, unspecified whether serious comorbidity present ([place])   6. Hyperlipidemia, unspecified hyperlipidemia type     PLAN:  Ms. Miyako Oelke is a very pleasant 51 year old female who has a history of mild coronary calcification on calcium score for which she was started on statin therapy, history of hypertension, hyperlipidemia, as well as remote melanoma.  She also has lower extremity reflux disease.  Du to concerns for obstructive sleep apnea with snoring, fatigability, morning headaches, and witnessed  apnea, she was referred for sleep apnea evaluation and was found to have at least moderate overall sleep apnea with an AHI of 8.3 but with severe O2 desaturation to a nadir of 75%.  On subsequent CPAP titration evaluation she was titrated to 11 cm of water but did not achieve REM sleep during that entire titration study.  As result, it was recommended that she initiate CPAP auto therapy with a pressure range of 10 to 16 cm of water and  she received a new ResMed AirSense 11 CPAP AutoSet unit on March 25, 2022.  Since initiating CPAP therapy, she has felt markedly improved.  She has more energy.  Her sleep is restorative.  There is no snoring.  She essentially does not have any nocturia.  Typically she goes to bed between 10 and 11 PM and wakes up around 7 AM.  Her download shows 100% compliance with usage at approximately 6 hours and 46 minutes per night.  At a pressure range of 10 to 16 cm of water AHI is 2.7.  Her 95th percentile pressure is 14.9 with maximum average pressure 15.6.  There is no mask leak.  I suspect she now is achieving good deep REM sleep leading to her improvement in restorative sleep.  Presently, I am making slight adjustment to her CPAP unit and will change her from a pressure setting of 10-16 to a pressure setting of 11 to 18 cm of water.  I will slightly increase her ramp start pressure up to 6. I had a very long discussion with her today in the office.  I discussed normal sleep architecture and the disruptive sleep architecture associated with obstructive sleep apnea.  I reviewed the parasympathetic mediated non-REM sleep versus sympathetic mediated REM sleep.  I reviewed in detail the effect on cardiovascular health if patients have untreated sleep apnea with particular reference to its effects on hypertension, nocturnal arrhythmias with increased sympathetic tone contributing to palpitations as well as increased atrial fibrillation risk.  I discussed its effects on insulin resistance  contributing to increased glucose, nocturnal GERD, as well as potential increased inflammation.  In addition we discussed nocturnal hypoxemia contributing potentially to nocturnal ischemia both cardiac as well as cerebrovascular particularly in patients with atherosclerosis.  I also discussed the pathophysiology associated with frequent nocturia seen in patients with untreated sleep apnea and the benefit of therapy.  Sh continues to feel exceptionally well with therapy.  She has been using a fullface mask with minimal leak.  I discussed optimal sleep duration at 7 and 9 hours importance of using CPAP throughout the night with the preponderance of REM sleep occurring in the second portion of the night.  Blood pressure today is stable on current therapy hydrochlorothiazide.  She is on Zetia for hyperlipidemia in addition to omega-3 fatty acid for triglyceride reduction.  Discussed the importance of weight loss and increased exercise.  She will return to the cardiology care of Dr. Oval Linsey.  I will see her in 1 year for reevaluation or sooner as needed.   Medication Adjustments/Labs and Tests Ordered: Current medicines are reviewed at length with the patient today.  Concerns regarding medicines are outlined above.  Medication changes, Labs and Tests ordered today are listed in the Patient Instructions below. Patient Instructions  Medication Instructions:  The current medical regimen is effective;  continue present plan and medications.  *If you need a refill on your cardiac medications before your next appointment, please call your pharmacy*   Follow-Up: At [facility], you and your health needs are our priority.  As part of our continuing mission to provide you with exceptional heart care, we have created designated Provider Care Teams.  These Care Teams include your primary Cardiologist (physician) and Advanced Practice Providers (APPs -  Physician Assistants and Nurse Practitioners) who all  work together to provide you with the care you need, when you need it.  We recommend signing up for the patient portal called "MyChart".  Sign up information  is provided on this After Visit Summary.  MyChart is used to connect with patients for Virtual Visits (Telemedicine).  Patients are able to view lab/test results, encounter notes, upcoming appointments, etc.  Non-urgent messages can be sent to your provider as well.   To learn more about what you can do with MyChart, go to [email or web address].    Your next appointment:   12 month(s)  The format for your next appointment:   In Person  Provider:   Shelva Majestic, MD           Signed, Shelva Majestic, MD, [facility], [place] Board of Sleep Medicine   07/12/2022 5:21 PM    [place] [address], [place], [place], [place]  [address] Phone: [phone]

## 2022-07-18 ENCOUNTER — Other Ambulatory Visit: Payer: BC Managed Care – PPO

## 2022-07-25 ENCOUNTER — Ambulatory Visit: Payer: BC Managed Care – PPO | Admitting: Family Medicine

## 2022-07-25 ENCOUNTER — Ambulatory Visit: Payer: BC Managed Care – PPO | Admitting: Oncology

## 2022-07-25 ENCOUNTER — Encounter: Payer: Self-pay | Admitting: Family Medicine

## 2022-07-25 VITALS — BP 116/73 | HR 61 | Temp 98.1°F | Ht 64.0 in | Wt 256.0 lb

## 2022-07-25 DIAGNOSIS — Z6841 Body Mass Index (BMI) 40.0 and over, adult: Secondary | ICD-10-CM

## 2022-07-25 DIAGNOSIS — E782 Mixed hyperlipidemia: Secondary | ICD-10-CM | POA: Diagnosis not present

## 2022-07-25 DIAGNOSIS — E785 Hyperlipidemia, unspecified: Secondary | ICD-10-CM | POA: Insufficient documentation

## 2022-07-25 DIAGNOSIS — R634 Abnormal weight loss: Secondary | ICD-10-CM

## 2022-07-25 DIAGNOSIS — R7303 Prediabetes: Secondary | ICD-10-CM | POA: Insufficient documentation

## 2022-07-25 MED ORDER — TIRZEPATIDE 2.5 MG/0.5ML ~~LOC~~ SOAJ: 2.5000 mg | SUBCUTANEOUS | 0 refills | Status: DC

## 2022-07-25 MED ORDER — NALTREXONE HCL 50 MG PO TABS: ORAL_TABLET | ORAL | 1 refills | Status: DC

## 2022-07-25 NOTE — Progress Notes (Signed)
Shannon Quinn , 1971-06-10, 51 y.o., female MRN: [id number] Patient Care Team    Relationship Specialty Notifications Start End  Ma Hillock, DO PCP - General Family Medicine  11/03/17   Jerrell Belfast, MD Consulting Physician Otolaryngology  11/03/17   Alda Berthold, DO Consulting Physician Neurology  11/03/17   Netta Cedars, MD Consulting Physician Orthopedic Surgery  06/01/19   Fanny Skates, MD Consulting Physician General Surgery  06/01/19   Irene Shipper, MD Consulting Physician Gastroenterology  05/23/21   [name], MD Attending Physician Cardiology  05/23/21     Chief Complaint  Patient presents with   preDM    Pt will call husband for food log     Subjective: Pt presents for an OV to discuss her weight and prediabetes.  She has a family history of heart disease.  She has a personal history of hypertension, hyperlipidemia and prediabetes. Patient has not brought in food log with her today.  Today we reviewed her daily intake.  She consumes many complex carbs within her meals and/or snacks. She is interested in a heart healthy diet as well as losing weight.     06/21/2022   10:50 AM 01/04/2022    3:05 PM 11/15/2021    9:07 AM 05/23/2021   10:04 AM 12/29/2020    2:41 PM  Depression screen PHQ 2/9  Decreased Interest '1 1 2 1 '$ 0  Down, Depressed, Hopeless '1 1 2  '$ 0  PHQ - 2 Score '2 2 4 1 '$ 0  Altered sleeping  2 3 0   Tired, decreased energy '2 3 2 2   '$ Change in appetite 0 0 1 0   Feeling bad or failure about yourself  0 0 0 1   Trouble concentrating '3 1 2 1   '$ Moving slowly or fidgety/restless '1 1 2 1   '$ Suicidal thoughts 0 0 0 0   PHQ-9 Score  '9 14 6     '$ Allergies  Allergen Reactions   Chlorhexidine Other (See Comments)    Red and purple burns and peeled skin. Cannot use surgical glue.  Pt states Betadine is safe and without reaction.   Crestor [Rosuvastatin] Swelling    Swelling in her hands   Neomycin     UNSPECIFIED REACTION    Other Other  (See Comments)    Body glue, turns skin blue   Sulfamethoxazole-Trimethoprim     UNSPECIFIED REACTION    Sulfasalazine Other (See Comments)   Sulfonamide Derivatives     UNSPECIFIED REACTION    Augmentin [Amoxicillin-Pot Clavulanate] Nausea And Vomiting    amox ok.    Hydrocodone Nausea And Vomiting    Severe vomiting   Oxycodone Nausea And Vomiting    Severe vomiting   Prednisone Other (See Comments)    insomnia   Social History   Social History Narrative   Regular exercise: walking 1 mile per day   Caffeine use: soda drinker   Lives with husband and 2 children in a one story home.     Works for [employer].  She works in Science writer   Education: associates degree.     Past Medical History:  Diagnosis Date   Abnormality of right breast on screening mammogram 10/09/2018   Allergy    Atypical chest pain 06/07/2019   Atypical migraine    Claudication in peripheral vascular disease ([place]) 07/10/2021   Colon polyps 2011   3 polpys removed  Complication of anesthesia    "severe Reaction" "Physically ill"    Deviated septum    Endometriosis    Family history of adverse reaction to anesthesia    violent when waking up from anesthesia - very sick   Hepatic cyst    chronic- stable by yearly PET performed by onc   Hypertension    Hypoglycemic syndrome 1996   during pregnancy   Hypothyroidism    Impingement syndrome of right shoulder region 06/04/2018   Kidney stone    Lymphedema of right arm    Melanoma ([place])    03/10/2018   PONV (postoperative nausea and vomiting)    Shoulder injury    right - side happened from last surgery   Thyroid disease    Varicose vein of leg    Past Surgical History:  Procedure Laterality Date   BREAST LUMPECTOMY WITH RADIOACTIVE SEED LOCALIZATION Right 10/09/2018   Procedure: RIGHT BREAST LUMPECTOMY WITH RADIOACTIVE SEED LOCALIZATION ERAS PATHWAY;  Surgeon: Fanny Skates, MD;  Location: [facility];  Service: General;  Laterality: Right;   COLONOSCOPY     polyps   ENDOVENOUS ABLATION SAPHENOUS VEIN W/ LASER Left 01/31/2022   endovenous laser ablation left greater saphenous vein and stab phlebectomy<10 incisions left leg by Harold Barban MD   LAPAROSCOPIC TOTAL HYSTERECTOMY  09/04/2017   Dr. Gaetano Net for endometriosis   MELANOMA EXCISION WITH SENTINEL LYMPH NODE BIOPSY Left 04/02/2018   Procedure: INJECT BLUE DYE MELANOMA LEFT BACK WITH  WIDE LOCAL EXCISION MELANOMA LEFT BACK BILATERAL AXILLARY DEEP SENTINEL LYMPH NODE BIOPSY;  Surgeon: Fanny Skates, MD;  Location: [facility];  Service: General;  Laterality: Left;   TUBAL LIGATION     WISDOM TOOTH EXTRACTION     Family History  Problem Relation Age of Onset   Arthritis Mother    Dementia Mother 46   Stroke Mother    Osteoporosis Mother    Alcohol abuse Father    Arthritis Father    Hyperlipidemia Father    Stroke Father        2012   Depression Father    Diabetes Father    Heart disease Father 7   Heart failure Father    Heart attack Father    Diabetes Sister    Lung cancer Sister        smoker   Breast cancer Maternal Grandmother 60   Dementia Maternal Grandmother    Heart disease Maternal Grandfather 109   CAD Maternal Grandfather    Diabetes Paternal Grandmother    COPD Paternal Grandfather        emphysema   CAD Maternal Uncle    Colon cancer Maternal Uncle    Cancer Maternal Uncle    CAD Paternal Aunt    Congestive Heart Failure Paternal Aunt    Kidney failure Paternal Aunt    Diabetes Paternal Aunt    Cancer Paternal Uncle        breast cancer   Colon cancer Paternal Uncle    Esophageal cancer Neg Hx    Stomach cancer Neg Hx    Rectal cancer Neg Hx    Allergies as of 07/25/2022       Reactions   Chlorhexidine Other (See Comments)   Red and purple burns and peeled skin. Cannot use surgical glue.  Pt states Betadine is safe and without reaction.   Crestor  [rosuvastatin] Swelling   Swelling in her hands   Neomycin    UNSPECIFIED REACTION  Other Other (See Comments)   Body glue, turns skin blue   Sulfamethoxazole-trimethoprim    UNSPECIFIED REACTION    Sulfasalazine Other (See Comments)   Sulfonamide Derivatives    UNSPECIFIED REACTION    Augmentin [amoxicillin-pot Clavulanate] Nausea And Vomiting   amox ok.    Hydrocodone Nausea And Vomiting   Severe vomiting   Oxycodone Nausea And Vomiting   Severe vomiting   Prednisone Other (See Comments)   insomnia        Medication List        Accurate as of July 25, 2022  1:46 PM. If you have any questions, ask your nurse or doctor.          amitriptyline 10 MG tablet Commonly known as: ELAVIL Take 1 tablet (10 mg total) by mouth at bedtime as needed for sleep.   aspirin EC 81 MG tablet Take 1 tablet (81 mg total) by mouth daily.   Excedrin Migraine 250-250-65 MG tablet Generic drug: aspirin-acetaminophen-caffeine Take 1-2 tablets by mouth every 6 (six) hours as needed for headache or migraine.   ezetimibe 10 MG tablet Commonly known as: ZETIA Take 1 tablet (10 mg total) by mouth daily.   fluticasone 50 MCG/ACT nasal spray Commonly known as: FLONASE Place 2 sprays into both nostrils daily. What changed: how much to take   hydrochlorothiazide 25 MG tablet Commonly known as: HYDRODIURIL Take 1 tablet (25 mg total) by mouth daily.   hydrOXYzine 10 MG tablet Commonly known as: ATARAX Take 1-3 tablets (10-30 mg total) by mouth 2 (two) times daily as needed.   naltrexone 50 MG tablet Commonly known as: DEPADE 1/2 tab twice daily before meal Started by: Howard Pouch, DO   nystatin-triamcinolone ointment Commonly known as: MYCOLOG Apply 1 Application topically 2 (two) times daily.   Omega-3 1000 MG Caps Take 2,000 mg by mouth.   ondansetron 4 MG tablet Commonly known as: ZOFRAN Take 1-2 tablets (4-8 mg total) by mouth every 8 (eight) hours as needed for  nausea or vomiting.   OVER THE COUNTER MEDICATION Allergy medication   rizatriptan 5 MG tablet Commonly known as: Maxalt Take 1 tablet (5 mg total) by mouth as needed for migraine. May repeat in 2 hours if needed   tirzepatide 2.5 MG/0.5ML Pen Commonly known as: MOUNJARO Inject 2.5 mg into the skin once a week. Started by: Howard Pouch, DO   topiramate 50 MG tablet Commonly known as: TOPAMAX Take 1 tablet (50 mg total) by mouth daily.   venlafaxine XR 150 MG 24 hr capsule Commonly known as: EFFEXOR-XR TAKE 1 CAPSULE BY MOUTH EVERY DAY        All past medical history, surgical history, allergies, family history, immunizations andmedications were updated in the EMR today and reviewed under the history and medication portions of their EMR.     ROS Negative, with the exception of above mentioned in HPI   Objective:  BP 116/73   Pulse 61   Temp 98.1 F (36.7 C) (Oral)   Ht '5\' 4"'$  (1.626 m)   Wt 256 lb (116.1 kg)   LMP 08/23/2017   SpO2 96%   BMI 43.94 kg/m  Body mass index is 43.94 kg/m.  Physical Exam Vitals and nursing note reviewed.  Constitutional:      General: She is not in acute distress.    Appearance: Normal appearance. She is obese. She is not ill-appearing or toxic-appearing.  Eyes:     Extraocular Movements: Extraocular movements intact.  Conjunctiva/sclera: Conjunctivae normal.     Pupils: Pupils are equal, round, and reactive to light.  Neurological:     Mental Status: She is alert and oriented to person, place, and time. Mental status is at baseline.  Psychiatric:        Mood and Affect: Mood normal.        Behavior: Behavior normal.        Thought Content: Thought content normal.        Judgment: Judgment normal.     No results found. No results found. No results found for this or any previous visit (from the past 24 hour(s)).  Assessment/Plan: Elizabeht Suto is a 51 y.o. female present for OV for  Start weight 256, BMI 43.94 Patient  was counseled on exercise, calorie counting, weight loss and potential medications to help with weight loss today. -Patient was provided with online resources for: Weekly net calorie calculator.  Applications for calorie counting.  Patient was advised to ensure she is taking in adequate nutrition daily by meeting calorie goals. -Patient was educated on dietary changes to not only lose weight but to eat healthy.  Patient was educated on glycemic index. -Patient was educated on exercise goal of 150 minutes a week (plus warm up and cool down) of cardiovascular exercise.  Patient was educated on heart rate for cardiovascular and fat burning zones. -Patient was encouraged to maintain adequate water consumption of at least 100 ounces a day, more if exercising/sweating. Discussed medication assistance: She would like to try depade to help with snacking control.  She will start her dietary changes Jan.2. Mounjaro 2.5 mg weekly prescribed  Reviewed expectations re: course of current medical issues. Discussed self-management of symptoms. Outlined signs and symptoms indicating need for more acute intervention. Patient verbalized understanding and all questions were answered. Patient received an After-Visit Summary.  Return in about 7 weeks (around 09/10/2022).   No orders of the defined types were placed in this encounter.  Meds ordered this encounter  Medications   naltrexone (DEPADE) 50 MG tablet    Sig: 1/2 tab twice daily before meal    Dispense:  90 tablet    Refill:  1   tirzepatide (MOUNJARO) 2.5 MG/0.5ML Pen    Sig: Inject 2.5 mg into the skin once a week.    Dispense:  2 mL    Refill:  0   Referral Orders  No referral(s) requested today     Note is dictated utilizing voice recognition software. Although note has been proof read prior to signing, occasional typographical errors still can be missed. If any questions arise, please do not hesitate to call for verification.    electronically signed by:  Howard Pouch, DO  [place]

## 2022-07-25 NOTE — Patient Instructions (Addendum)
Weekly "net calorie calculator."   Applications for calorie counting.   Food scale glycemic index. exercise goal of 150 minutes a week (plus warm up and cool down) of cardiovascular exercise.   Patient was encouraged to maintain adequate water consumption of at least 100 ounces a day   Return in about 7 weeks (around 09/10/2022).        Great to see you today.  I have refilled the medication(s) we provide.   If labs were collected, we will inform you of lab results once received either by echart message or telephone call.   - echart message- for normal results that have been seen by the patient already.   - telephone call: abnormal results or if patient has not viewed results in their echart.

## 2022-07-31 ENCOUNTER — Other Ambulatory Visit: Payer: Self-pay

## 2022-07-31 ENCOUNTER — Telehealth: Payer: Self-pay | Admitting: Family Medicine

## 2022-07-31 ENCOUNTER — Encounter (HOSPITAL_COMMUNITY)
Admission: RE | Admit: 2022-07-31 | Discharge: 2022-07-31 | Disposition: A | Discharge status: Home / Self Care | Payer: BC Managed Care – PPO | Source: Ambulatory Visit | Attending: Oncology | Admitting: Oncology

## 2022-07-31 ENCOUNTER — Inpatient Hospital Stay: Payer: BC Managed Care – PPO | Attending: Oncology

## 2022-07-31 DIAGNOSIS — C439 Malignant melanoma of skin, unspecified: Secondary | ICD-10-CM | POA: Diagnosis not present

## 2022-07-31 DIAGNOSIS — Z85820 Personal history of malignant melanoma of skin: Secondary | ICD-10-CM | POA: Diagnosis not present

## 2022-07-31 DIAGNOSIS — K7689 Other specified diseases of liver: Secondary | ICD-10-CM | POA: Insufficient documentation

## 2022-07-31 DIAGNOSIS — Z08 Encounter for follow-up examination after completed treatment for malignant neoplasm: Secondary | ICD-10-CM | POA: Diagnosis not present

## 2022-07-31 DIAGNOSIS — C4359 Malignant melanoma of other part of trunk: Secondary | ICD-10-CM | POA: Diagnosis not present

## 2022-07-31 LAB — CBC WITH DIFFERENTIAL (CANCER CENTER ONLY)
Abs Immature Granulocytes: 0.02 10*3/uL (ref 0.00–0.07)
Basophils Absolute: 0.1 10*3/uL (ref 0.0–0.1)
Basophils Relative: 1 %
Eosinophils Absolute: 0.3 10*3/uL (ref 0.0–0.5)
Eosinophils Relative: 5 %
HCT: 43.7 % (ref 36.0–46.0)
Hemoglobin: 14.4 g/dL (ref 12.0–15.0)
Immature Granulocytes: 0 %
Lymphocytes Relative: 30 %
Lymphs Abs: 1.8 10*3/uL (ref 0.7–4.0)
MCH: 30.2 pg (ref 26.0–34.0)
MCHC: 33 g/dL (ref 30.0–36.0)
MCV: 91.6 fL (ref 80.0–100.0)
Monocytes Absolute: 0.3 10*3/uL (ref 0.1–1.0)
Monocytes Relative: 5 %
Neutro Abs: 3.5 10*3/uL (ref 1.7–7.7)
Neutrophils Relative %: 59 %
Platelet Count: 278 10*3/uL (ref 150–400)
RBC: 4.77 MIL/uL (ref 3.87–5.11)
RDW: 13.9 % (ref 11.5–15.5)
WBC Count: 6 10*3/uL (ref 4.0–10.5)
nRBC: 0 % (ref 0.0–0.2)

## 2022-07-31 LAB — CMP (CANCER CENTER ONLY)
ALT: 97 U/L — ABNORMAL HIGH (ref 0–44)
AST: 64 U/L — ABNORMAL HIGH (ref 15–41)
Albumin: 4.2 g/dL (ref 3.5–5.0)
Alkaline Phosphatase: 86 U/L (ref 38–126)
Anion gap: 7 (ref 5–15)
BUN: 15 mg/dL (ref 6–20)
CO2: 25 mmol/L (ref 22–32)
Calcium: 9.9 mg/dL (ref 8.9–10.3)
Chloride: 108 mmol/L (ref 98–111)
Creatinine: 0.97 mg/dL (ref 0.44–1.00)
GFR, Estimated: >60 mL/min (ref >60)
Glucose, Bld: 125 mg/dL — ABNORMAL HIGH (ref 70–99)
Potassium: 3.9 mmol/L (ref 3.5–5.1)
Sodium: 140 mmol/L (ref 135–145)
Total Bilirubin: 0.5 mg/dL (ref 0.3–1.2)
Total Protein: 7.8 g/dL (ref 6.5–8.1)

## 2022-07-31 LAB — GLUCOSE, CAPILLARY: Glucose-Capillary: 119 mg/dL — ABNORMAL HIGH (ref 70–99)

## 2022-07-31 MED ORDER — FLUDEOXYGLUCOSE F - 18 (FDG) INJECTION
12.5000 | Freq: Once | INTRAVENOUS | Status: AC
  Administered 2022-07-31: 12.74 via INTRAVENOUS

## 2022-07-31 NOTE — Telephone Encounter (Signed)
Completed patient's medical accommodations form and placed in [place] work basket

## 2022-08-01 NOTE — Telephone Encounter (Signed)
Pt informed forms are faxed per her request.

## 2022-08-03 ENCOUNTER — Other Ambulatory Visit: Payer: Self-pay | Admitting: Family Medicine

## 2022-08-07 ENCOUNTER — Inpatient Hospital Stay (HOSPITAL_BASED_OUTPATIENT_CLINIC_OR_DEPARTMENT_OTHER): Payer: BC Managed Care – PPO | Admitting: Oncology

## 2022-08-07 VITALS — BP 121/75 | HR 70 | Temp 98.6°F | Resp 19 | Ht 64.0 in | Wt 256.7 lb

## 2022-08-07 DIAGNOSIS — C439 Malignant melanoma of skin, unspecified: Secondary | ICD-10-CM

## 2022-08-07 DIAGNOSIS — Z08 Encounter for follow-up examination after completed treatment for malignant neoplasm: Secondary | ICD-10-CM | POA: Insufficient documentation

## 2022-08-07 DIAGNOSIS — Z85820 Personal history of malignant melanoma of skin: Secondary | ICD-10-CM | POA: Diagnosis not present

## 2022-08-07 NOTE — Progress Notes (Signed)
Hematology and Oncology Follow Up Visit  Shannon Quinn [id number] 02-13-1971 51 y.o. 08/07/2022 11:19 AM Shannon Quinn, Shannon Quinn, DOKuneff, Shannon A, DO   Principle Diagnosis: 51 year old woman with T2aN1 melanoma of the upper back with 1 out of 10 lymph node involved.melanoma diagnosed in 2019.   Prior Therapy: She underwent wide excision with bilateral sentinel lymph node biopsy of the axilla.  The final pathology obtained on April 02, 2018 showed Quinn residual melanoma of 0.8 mm T staging was T2a.  She had bilateral lymph node sentinel lymph node biopsy with 1 out of 10 lymph nodes were positive for melanoma.  Less than 0.1 mm tiny focus of melanoma seen on 1 of those 10 lymph nodes.   Current therapy: Active surveillance.  Interim History: Shannon Quinn presents today for repeat follow-up.  Since last visit, she reports feeling well without any major complaints.  She denies any nausea, vomiting or abdominal pain.  She denies weight loss or appetite changes.  She denies any hospitalizations or illnesses.  He denies skin rashes or lesions.  Continues to follow with dermatology.            Medications: Updated on review. Current Outpatient Medications  Medication Sig Dispense Refill   amitriptyline (ELAVIL) 10 MG tablet Take 1 tablet (10 mg total) by mouth at bedtime as needed for sleep. 24 tablet 1   aspirin EC 81 MG tablet Take 1 tablet (81 mg total) by mouth daily. 90 tablet 3   aspirin-acetaminophen-caffeine (EXCEDRIN MIGRAINE) 250-250-65 MG tablet Take 1-2 tablets by mouth every 6 (six) hours as needed for headache or migraine.     ezetimibe (ZETIA) 10 MG tablet Take 1 tablet (10 mg total) by mouth daily. 90 tablet 3   fluticasone (FLONASE) 50 MCG/ACT nasal spray Place 2 sprays into both nostrils daily. (Patient taking differently: Place 1 spray into both nostrils daily.) 16 g 11   hydrochlorothiazide (HYDRODIURIL) 25 MG tablet Take 1 tablet (25 mg total) by mouth daily. 90 tablet 1    hydrOXYzine (ATARAX) 10 MG tablet Take 1-3 tablets (10-30 mg total) by mouth 2 (two) times daily as needed. 90 tablet 5   naltrexone (DEPADE) 50 MG tablet 1/2 tab twice daily before meal 90 tablet 1   nystatin-triamcinolone ointment (MYCOLOG) Apply 1 Application topically 2 (two) times daily. 30 g 0   Omega-3 1000 MG CAPS Take 2,000 mg by mouth.     ondansetron (ZOFRAN) 4 MG tablet Take 1-2 tablets (4-8 mg total) by mouth every 8 (eight) hours as needed for nausea or vomiting. 40 tablet 5   OVER THE COUNTER MEDICATION Allergy medication     rizatriptan (MAXALT) 5 MG tablet Take 1 tablet (5 mg total) by mouth as needed for migraine. May repeat in 2 hours if needed 10 tablet 5   tirzepatide (MOUNJARO) 2.5 MG/0.5ML Pen Inject 2.5 mg into the skin once Quinn week. 2 mL 0   topiramate (TOPAMAX) 50 MG tablet Take 1 tablet (50 mg total) by mouth daily. 90 tablet 1   venlafaxine XR (EFFEXOR-XR) 150 MG 24 hr capsule TAKE 1 CAPSULE BY MOUTH EVERY DAY 90 capsule 1   No current facility-administered medications for this visit.     Allergies:  Allergies  Allergen Reactions   Chlorhexidine Other (See Comments)    Red and purple burns and peeled skin. Cannot use surgical glue.  Pt states Betadine is safe and without reaction.   Crestor [Rosuvastatin] Swelling    Swelling in her hands  Neomycin     UNSPECIFIED REACTION    Other Other (See Comments)    Body glue, turns skin blue   Sulfamethoxazole-Trimethoprim     UNSPECIFIED REACTION    Sulfasalazine Other (See Comments)   Sulfonamide Derivatives     UNSPECIFIED REACTION    Augmentin [Amoxicillin-Pot Clavulanate] Nausea And Vomiting    amox ok.    Hydrocodone Nausea And Vomiting    Severe vomiting   Oxycodone Nausea And Vomiting    Severe vomiting   Prednisone Other (See Comments)    insomnia        Physical Exam:      Blood pressure 121/75, pulse 70, temperature 98.6 F (37 C), temperature source Temporal, resp. rate 19, height 5'  4" (1.626 m), weight 256 lb 11.2 oz (116.4 kg), last menstrual period 08/23/2017, SpO2 99 %.    ECOG: 0    General appearance: Comfortable appearing without any discomfort Head: Normocephalic without any trauma Oropharynx: Mucous membranes are moist and pink without any thrush or ulcers. Eyes: Pupils are equal and round reactive to light. Lymph nodes: No cervical, supraclavicular, inguinal or axillary lymphadenopathy.   Heart:regular rate and rhythm.  S1 and S2 without leg edema. Lung: Clear without any rhonchi or wheezes.  No dullness to percussion. Abdomin: Soft, nontender, nondistended with good bowel sounds.  No hepatosplenomegaly. Musculoskeletal: No joint deformity or effusion.  Full range of motion noted. Neurological: No deficits noted on motor, sensory and deep tendon reflex exam. Skin: No petechial rash or dryness.  Appeared moist.           Lab Results: Lab Results  Component Value Date   WBC 6.0 07/31/2022   HGB 14.4 07/31/2022   HCT 43.7 07/31/2022   MCV 91.6 07/31/2022   PLT 278 07/31/2022     Chemistry      Component Value Date/Time   NA 140 07/31/2022 1033   NA 140 12/07/2021 1218   K 3.9 07/31/2022 1033   CL 108 07/31/2022 1033   CO2 25 07/31/2022 1033   BUN 15 07/31/2022 1033   BUN 11 12/07/2021 1218   CREATININE 0.97 07/31/2022 1033   CREATININE 0.88 06/01/2019 1556   GLU 87 08/30/2017 0000      Component Value Date/Time   CALCIUM 9.9 07/31/2022 1033   ALKPHOS 86 07/31/2022 1033   AST 64 (H) 07/31/2022 1033   ALT 97 (H) 07/31/2022 1033   BILITOT 0.5 07/31/2022 1033       ABDOMEN/PELVIS: No abnormal hypermetabolic activity within the liver, pancreas, adrenal glands, or spleen. No hypermetabolic lymph nodes in the abdomen or pelvis.   Incidental CT findings: Several large benign hepatic cysts. Post hysterectomy. Adnexa unremarkable   SKELETON: No focal hypermetabolic activity to suggest skeletal metastasis.   Incidental CT  findings: none   EXTREMITIES: No abnormal hypermetabolic activity in the lower extremities.   Incidental CT findings: none   IMPRESSION: No evidence of metastatic melanoma on whole-body FDG PET scan.      Impression and Plan:  59 year old woman with:   1.  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   She has no evidence of active disease after surgical resection.   She continues to be on active surveillance at this time and did not receive adjuvant immunotherapy.  PET scan obtained on July 31, 2022 was personally reviewed and showed no evidence of metastatic disease.  Risk of relapse was assessed at this time and management choices were discussed.  Therapy including immunotherapy or BRAF targeted therapy (if she harbors appropriate mutation) would be considered.  Based on these findings I recommend updating her scan in 1 year and suspend surveillance at that time.  2.  Dermatology surveillance: This is to be continued indefinitely and she is currently up-to-date.  3.  Age-appropriate cancer screening: Remains up-to-date including her mammography.  4.  Elevated AST and ALT: Likely related to hepatic steatosis and she is currently working with her primary care physician regarding weight loss strategies.   5.  Follow-up: In 6 months for repeat follow-up and in 12 months for repeat imaging.   30  minutes were spent on this encounter.  The time was dedicated to updating disease status, treatment choices and outlining future plan of care discussion.   Zola Button, MD 12/27/202311:19 AM

## 2022-08-29 ENCOUNTER — Telehealth: Payer: Self-pay

## 2022-08-29 NOTE — Telephone Encounter (Signed)
Patient thinks she may be having a side effect from taking Naltrexone. She is requesting an appt tomorrow, as of right now I told her Dr. Raoul Pitch is booked for tomorrow unless we have a cancellation. Please call patient about side effects, different headaches since taking meds on 1/1.  [phone] tomorrow 08/29/22. Patient aware Dr. Raoul Pitch has left office for the day.

## 2022-08-30 NOTE — Telephone Encounter (Signed)
Spoke with patient regarding results/recommendations.  

## 2022-09-02 ENCOUNTER — Encounter: Payer: Self-pay | Admitting: Family Medicine

## 2022-09-02 ENCOUNTER — Ambulatory Visit: Payer: BC Managed Care – PPO | Admitting: Family Medicine

## 2022-09-02 VITALS — BP 114/73 | HR 67 | Temp 97.4°F | Wt 248.2 lb

## 2022-09-02 DIAGNOSIS — R519 Headache, unspecified: Secondary | ICD-10-CM

## 2022-09-02 DIAGNOSIS — G43101 Migraine with aura, not intractable, with status migrainosus: Secondary | ICD-10-CM | POA: Diagnosis not present

## 2022-09-02 MED ORDER — METHYLPREDNISOLONE ACETATE 80 MG/ML IJ SUSP
80.0000 mg | Freq: Once | INTRAMUSCULAR | Status: AC
  Administered 2022-09-02: 80 mg via INTRAMUSCULAR

## 2022-09-02 NOTE — Progress Notes (Signed)
Shannon Quinn , February 03, 1971, 52 y.o., female MRN: [id number] Patient Care Team    Relationship Specialty Notifications Start End  Ma Hillock, DO PCP - General Family Medicine  11/03/17   Jerrell Belfast, MD Consulting Physician Otolaryngology  11/03/17   Alda Berthold, DO Consulting Physician Neurology  11/03/17   Netta Cedars, MD Consulting Physician Orthopedic Surgery  06/01/19   Fanny Skates, MD Consulting Physician General Surgery  06/01/19   Irene Shipper, MD Consulting Physician Gastroenterology  05/23/21   [name], MD Attending Physician Cardiology  05/23/21     Chief Complaint  Patient presents with   Headache    3 weeks. Thinks weather also has something to do with headache.     Subjective: Pt presents for an OV with complaints of 3 weeks of intermittent headache.  Patient thinks the weather initially started the headache.  She has a history of chronic migraines that seem to be more frequent as of recent and lasting longer.  She does have a concerning history of melanoma.  She has been monitored closely by her oncology team with recent PET scan that was normal 07/31/2022.  Dedicated brain MRI was normal/4 /17/2021 for her migraines. She reports compliance with her migraine regimen which typically has worked fairly well for her which includes Topamax, Elavil Atarax, Maxalt.     06/21/2022   10:50 AM 01/04/2022    3:05 PM 11/15/2021    9:07 AM 05/23/2021   10:04 AM 12/29/2020    2:41 PM  Depression screen PHQ 2/9  Decreased Interest '1 1 2 1 '$ 0  Down, Depressed, Hopeless '1 1 2  '$ 0  PHQ - 2 Score '2 2 4 1 '$ 0  Altered sleeping  2 3 0   Tired, decreased energy '2 3 2 2   '$ Change in appetite 0 0 1 0   Feeling bad or failure about yourself  0 0 0 1   Trouble concentrating '3 1 2 1   '$ Moving slowly or fidgety/restless '1 1 2 1   '$ Suicidal thoughts 0 0 0 0   PHQ-9 Score  '9 14 6     '$ Allergies  Allergen Reactions   Chlorhexidine Other (See Comments)    Red and  purple burns and peeled skin. Cannot use surgical glue.  Pt states Betadine is safe and without reaction.   Crestor [Rosuvastatin] Swelling    Swelling in her hands   Neomycin     UNSPECIFIED REACTION    Other Other (See Comments)    Body glue, turns skin blue   Sulfamethoxazole-Trimethoprim     UNSPECIFIED REACTION    Sulfasalazine Other (See Comments)   Sulfonamide Derivatives     UNSPECIFIED REACTION    Augmentin [Amoxicillin-Pot Clavulanate] Nausea And Vomiting    amox ok.    Hydrocodone Nausea And Vomiting    Severe vomiting   Oxycodone Nausea And Vomiting    Severe vomiting   Prednisone Other (See Comments)    insomnia   Social History   Social History Narrative   Regular exercise: walking 1 mile per day   Caffeine use: soda drinker   Lives with husband and 2 children in a one story home.     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     Past Medical History:  Diagnosis Date   Abnormality of right breast on screening mammogram 10/09/2018   Allergy  Atypical chest pain 06/07/2019   Atypical migraine    Claudication in peripheral vascular disease ([place]) 07/10/2021   Colon polyps 2011   3 polpys removed   Complication of anesthesia    "severe Reaction" "Physically ill"    Deviated septum    Endometriosis    Family history of adverse reaction to anesthesia    violent when waking up from anesthesia - very sick   Hepatic cyst    chronic- stable by yearly PET performed by onc   Hypertension    Hypoglycemic syndrome 1996   during pregnancy   Hypothyroidism    Impingement syndrome of right shoulder region 06/04/2018   Kidney stone    Lymphedema of right arm    Melanoma ([place])    03/10/2018   PONV (postoperative nausea and vomiting)    Shoulder injury    right - side happened from last surgery   Thyroid disease    Varicose vein of leg    Past Surgical History:  Procedure Laterality  Date   BREAST LUMPECTOMY WITH RADIOACTIVE SEED LOCALIZATION Right 10/09/2018   Procedure: RIGHT BREAST LUMPECTOMY WITH RADIOACTIVE SEED LOCALIZATION ERAS PATHWAY;  Surgeon: Fanny Skates, MD;  Location: [facility];  Service: General;  Laterality: Right;   COLONOSCOPY     polyps   ENDOVENOUS ABLATION SAPHENOUS VEIN W/ LASER Left 01/31/2022   endovenous laser ablation left greater saphenous vein and stab phlebectomy<10 incisions left leg by Harold Barban MD   LAPAROSCOPIC TOTAL HYSTERECTOMY  09/04/2017   Dr. Gaetano Net for endometriosis   MELANOMA EXCISION WITH SENTINEL LYMPH NODE BIOPSY Left 04/02/2018   Procedure: INJECT BLUE DYE MELANOMA LEFT BACK WITH  WIDE LOCAL EXCISION MELANOMA LEFT BACK BILATERAL AXILLARY DEEP SENTINEL LYMPH NODE BIOPSY;  Surgeon: Fanny Skates, MD;  Location: [facility];  Service: General;  Laterality: Left;   TUBAL LIGATION     WISDOM TOOTH EXTRACTION     Family History  Problem Relation Age of Onset   Arthritis Mother    Dementia Mother 61   Stroke Mother    Osteoporosis Mother    Alcohol abuse Father    Arthritis Father    Hyperlipidemia Father    Stroke Father        2012   Depression Father    Diabetes Father    Heart disease Father 1   Heart failure Father    Heart attack Father    Diabetes Sister    Lung cancer Sister        smoker   Breast cancer Maternal Grandmother 60   Dementia Maternal Grandmother    Heart disease Maternal Grandfather 43   CAD Maternal Grandfather    Diabetes Paternal Grandmother    COPD Paternal Grandfather        emphysema   CAD Maternal Uncle    Colon cancer Maternal Uncle    Cancer Maternal Uncle    CAD Paternal Aunt    Congestive Heart Failure Paternal Aunt    Kidney failure Paternal Aunt    Diabetes Paternal Aunt    Cancer Paternal Uncle        breast cancer   Colon cancer Paternal Uncle    Esophageal cancer Neg Hx    Stomach cancer Neg Hx    Rectal cancer Neg Hx     Allergies as of 09/02/2022       Reactions   Chlorhexidine Other (See Comments)   Red and purple burns and peeled skin. Cannot use surgical glue.  Pt states Betadine is safe and without reaction.   Crestor [rosuvastatin] Swelling   Swelling in her hands   Neomycin    UNSPECIFIED REACTION    Other Other (See Comments)   Body glue, turns skin blue   Sulfamethoxazole-trimethoprim    UNSPECIFIED REACTION    Sulfasalazine Other (See Comments)   Sulfonamide Derivatives    UNSPECIFIED REACTION    Augmentin [amoxicillin-pot Clavulanate] Nausea And Vomiting   amox ok.    Hydrocodone Nausea And Vomiting   Severe vomiting   Oxycodone Nausea And Vomiting   Severe vomiting   Prednisone Other (See Comments)   insomnia        Medication List        Accurate as of September 02, 2022  3:31 PM. If you have any questions, ask your nurse or doctor.          STOP taking these medications    naltrexone 50 MG tablet Commonly known as: DEPADE Stopped by: Howard Pouch, DO       TAKE these medications    amitriptyline 10 MG tablet Commonly known as: ELAVIL Take 1 tablet (10 mg total) by mouth at bedtime as needed for sleep.   aspirin EC 81 MG tablet Take 1 tablet (81 mg total) by mouth daily.   Excedrin Migraine 250-250-65 MG tablet Generic drug: aspirin-acetaminophen-caffeine Take 1-2 tablets by mouth every 6 (six) hours as needed for headache or migraine.   ezetimibe 10 MG tablet Commonly known as: ZETIA Take 1 tablet (10 mg total) by mouth daily.   fluticasone 50 MCG/ACT nasal spray Commonly known as: FLONASE Place 2 sprays into both nostrils daily. What changed: how much to take   hydrochlorothiazide 25 MG tablet Commonly known as: HYDRODIURIL Take 1 tablet (25 mg total) by mouth daily.   hydrOXYzine 10 MG tablet Commonly known as: ATARAX Take 1-3 tablets (10-30 mg total) by mouth 2 (two) times daily as needed.   nystatin-triamcinolone ointment Commonly known  as: MYCOLOG Apply 1 Application topically 2 (two) times daily.   Omega-3 1000 MG Caps Take 2,000 mg by mouth.   ondansetron 4 MG tablet Commonly known as: ZOFRAN Take 1-2 tablets (4-8 mg total) by mouth every 8 (eight) hours as needed for nausea or vomiting.   OVER THE COUNTER MEDICATION Allergy medication   rizatriptan 5 MG tablet Commonly known as: Maxalt Take 1 tablet (5 mg total) by mouth as needed for migraine. May repeat in 2 hours if needed   rosuvastatin 10 MG tablet Commonly known as: CRESTOR Take 10 mg by mouth daily.   tirzepatide 2.5 MG/0.5ML Pen Commonly known as: MOUNJARO Inject 2.5 mg into the skin once a week.   topiramate 50 MG tablet Commonly known as: TOPAMAX Take 1 tablet (50 mg total) by mouth daily.   venlafaxine XR 150 MG 24 hr capsule Commonly known as: EFFEXOR-XR TAKE 1 CAPSULE BY MOUTH EVERY DAY        All past medical history, surgical history, allergies, family history, immunizations andmedications were updated in the EMR today and reviewed under the history and medication portions of their EMR.     ROS Negative, with the exception of above mentioned in HPI   Objective:  BP 114/73   Pulse 67   Temp (!) 97.4 F (36.3 C)   Wt 248 lb 3.2 oz (112.6 kg)   LMP 08/23/2017   SpO2 93%   BMI 42.60 kg/m  Body mass index is 42.6 kg/m. Physical Exam Vitals and  nursing note reviewed.  Constitutional:      General: She is not in acute distress.    Appearance: Normal appearance. She is normal weight. She is not ill-appearing or toxic-appearing.  HENT:     Head: Normocephalic and atraumatic.  Eyes:     General: No scleral icterus.       Right eye: No discharge.        Left eye: No discharge.     Extraocular Movements: Extraocular movements intact.     Right eye: Normal extraocular motion.     Left eye: Normal extraocular motion.     Conjunctiva/sclera: Conjunctivae normal.     Pupils: Pupils are equal, round, and reactive to light.   Cardiovascular:     Rate and Rhythm: Normal rate and regular rhythm.  Musculoskeletal:     Cervical back: Normal range of motion and neck supple.  Skin:    Findings: No rash.  Neurological:     Mental Status: She is alert and oriented to person, place, and time. Mental status is at baseline.     Cranial Nerves: No dysarthria or facial asymmetry.     Motor: No weakness.     Coordination: Coordination normal.     Gait: Gait normal.  Psychiatric:        Mood and Affect: Mood normal.        Behavior: Behavior normal.        Thought Content: Thought content normal.        Judgment: Judgment normal.      No results found. No results found. No results found for this or any previous visit (from the past 24 hour(s)).  Assessment/Plan: Shannon Quinn is a 52 y.o. female present for OV for  Worsening headaches/chronic migraines We discussed her headaches seem to be worsening despite treatment.  It is reassuring her PET scan is normal considering her history of melanoma. I recommended she be evaluated by neurology.  Since her headache frequency is are increasing, she may meet criteria for -mab tx or like med.  - Ambulatory referral to Neurology - methylPREDNISolone acetate (DEPO-MEDROL) injection 80 mg -Patient is driving, therefore she will take her Vistaril and Zofran when she gets home.  Reviewed expectations re: course of current medical issues. Discussed self-management of symptoms. Outlined signs and symptoms indicating need for more acute intervention. Patient verbalized understanding and all questions were answered. Patient received an After-Visit Summary.    Orders Placed This Encounter  Procedures   Ambulatory referral to Neurology   Meds ordered this encounter  Medications   methylPREDNISolone acetate (DEPO-MEDROL) injection 80 mg   Referral Orders         Ambulatory referral to Neurology       Note is dictated utilizing voice recognition software. Although note  has been proof read prior to signing, occasional typographical errors still can be missed. If any questions arise, please do not hesitate to call for verification.   electronically signed by:  Howard Pouch, DO  [place]

## 2022-09-05 ENCOUNTER — Encounter: Payer: Self-pay | Admitting: Neurology

## 2022-10-02 ENCOUNTER — Other Ambulatory Visit: Payer: Self-pay

## 2022-10-02 DIAGNOSIS — C439 Malignant melanoma of skin, unspecified: Secondary | ICD-10-CM

## 2022-10-06 DIAGNOSIS — Z6838 Body mass index (BMI) 38.0-38.9, adult: Secondary | ICD-10-CM | POA: Diagnosis not present

## 2022-10-06 DIAGNOSIS — Z136 Encounter for screening for cardiovascular disorders: Secondary | ICD-10-CM | POA: Diagnosis not present

## 2022-10-06 DIAGNOSIS — I10 Essential (primary) hypertension: Secondary | ICD-10-CM | POA: Diagnosis not present

## 2022-10-06 DIAGNOSIS — Z713 Dietary counseling and surveillance: Secondary | ICD-10-CM | POA: Diagnosis not present

## 2022-10-06 DIAGNOSIS — E7800 Pure hypercholesterolemia, unspecified: Secondary | ICD-10-CM | POA: Diagnosis not present

## 2022-10-06 DIAGNOSIS — Z13220 Encounter for screening for lipoid disorders: Secondary | ICD-10-CM | POA: Diagnosis not present

## 2022-10-09 ENCOUNTER — Ambulatory Visit: Payer: BC Managed Care – PPO | Admitting: Neurology

## 2022-10-09 ENCOUNTER — Encounter: Payer: Self-pay | Admitting: Neurology

## 2022-10-09 VITALS — BP 132/80 | HR 69 | Ht 64.0 in | Wt 247.0 lb

## 2022-10-09 DIAGNOSIS — G4440 Drug-induced headache, not elsewhere classified, not intractable: Secondary | ICD-10-CM

## 2022-10-09 DIAGNOSIS — G43701 Chronic migraine without aura, not intractable, with status migrainosus: Secondary | ICD-10-CM

## 2022-10-09 DIAGNOSIS — M542 Cervicalgia: Secondary | ICD-10-CM | POA: Diagnosis not present

## 2022-10-09 MED ORDER — TOPIRAMATE 50 MG PO TABS: 75.0000 mg | ORAL_TABLET | Freq: Every day | ORAL | 1 refills | Status: DC

## 2022-10-09 MED ORDER — RIZATRIPTAN BENZOATE 10 MG PO TABS: 10.0000 mg | ORAL_TABLET | ORAL | 0 refills | Status: DC | PRN

## 2022-10-09 NOTE — Patient Instructions (Addendum)
Start neck physiotherapy  Increase topiramate '75mg'$  daily  Start neck physiotherapy  Return to clinic in 4 months

## 2022-10-09 NOTE — Progress Notes (Signed)
[facility] Clinic Note - Initial Visit   Date: 10/09/2022   Shannon Quinn MRN: [id number] DOB: 22-Aug-1970   Dear Dr. Raoul Pitch:  Thank you for your kind referral of Shannon Quinn for consultation of migraines. Although her history is well known to you, please allow [name] to reiterate it for the purpose of our medical record. The patient was accompanied to the clinic by self.     Shannon Quinn is a 52 y.o. female with history of melanoma, hypertension, and hyperlipidemia presenting for evaluation of migraines.   IMPRESSION/PLAN: Chronic migraine  - For preventative therapy, increase topiramate to '75mg'$  daily  - For rescue therapy, maxalt '10mg'$   2.  Medication overuse headaches  - Limited OTC tylneol, ibuprofen, execedin and triptan to twice week  3.  Cervicalgia  - Start neck PT  Return to clinic in 4 months  ------------------------------------------------------------- History of present illness: Headaches started in 2019 after having melanoma resection in 2019.  Pain starts in variable locations, usually stress-related pain starts in the base of the neck and radiates of her head.  Weather-related triggers are sharp and occur in random locations.  Pain is characterized as tight-pressure, sharp stabbing pain, or throbbing.  Pain lasts ~2-7 days, on average usually about 3 days.  She endorses photophobia, phonophobia, nausea, and vomiting.  She hears sounds like cicadas prior to headache onset and throughout the duration.  She is currently on topiramate '50mg'$  daily and tolerating it well.  She does feel that it helps.  For rescue, she takes tylenol, excedrin, and ibuprofen which she takes 3 days per week.  She takes maxalt 3-4 times per month which helps. She also takes atarax and venlafaxine '150mg'$  which she takes for hot flashes.   She takes amitriptyline '10mg'$  at bedtime for sleep.  She is very sedated so only takes it on the weekend.   She works in [employer] and job is stressful. She endorses neck tension and feels that stretching helps.  She has done neck PT about 2 years ago.  MRI brain in 2021 was normal.    Out-side paper records, electronic medical record, and images have been reviewed where available and summarized as:  MRI brain wwo contrast 11/29/2019:  Normal  Lab Results  Component Value Date   HGBA1C 6.0 06/21/2022   No results found for: "VITAMINB12" Lab Results  Component Value Date   TSH 2.94 06/21/2022   No results found for: "ESRSEDRATE", "POCTSEDRATE"  Past Medical History:  Diagnosis Date   Abnormality of right breast on screening mammogram 10/09/2018   Allergy    Atypical chest pain 06/07/2019   Atypical migraine    Claudication in peripheral vascular disease ([place]) 07/10/2021   Colon polyps 2011   3 polpys removed   Complication of anesthesia    "severe Reaction" "Physically ill"    Deviated septum    Endometriosis    Family history of adverse reaction to anesthesia    violent when waking up from anesthesia - very sick   Hepatic cyst    chronic- stable by yearly PET performed by onc   Hypertension    Hypoglycemic syndrome 1996   during pregnancy   Hypothyroidism    Impingement syndrome of right shoulder region 06/04/2018   Kidney stone    Lymphedema of right arm    Melanoma ([place])    03/10/2018   PONV (postoperative nausea and vomiting)    Shoulder injury    right - side happened from last surgery  Thyroid disease    Varicose vein of leg     Past Surgical History:  Procedure Laterality Date   BREAST LUMPECTOMY WITH RADIOACTIVE SEED LOCALIZATION Right 10/09/2018   Procedure: RIGHT BREAST LUMPECTOMY WITH RADIOACTIVE SEED LOCALIZATION ERAS PATHWAY;  Surgeon: Fanny Skates, MD;  Location: [facility];  Service: General;  Laterality: Right;   COLONOSCOPY     polyps   ENDOVENOUS ABLATION SAPHENOUS VEIN W/ LASER Left 01/31/2022   endovenous laser ablation left greater saphenous  vein and stab phlebectomy<10 incisions left leg by Harold Barban MD   LAPAROSCOPIC TOTAL HYSTERECTOMY  09/04/2017   Dr. Gaetano Net for endometriosis   MELANOMA EXCISION WITH SENTINEL LYMPH NODE BIOPSY Left 04/02/2018   Procedure: INJECT BLUE DYE MELANOMA LEFT BACK WITH  WIDE LOCAL EXCISION MELANOMA LEFT BACK BILATERAL AXILLARY DEEP SENTINEL LYMPH NODE BIOPSY;  Surgeon: Fanny Skates, MD;  Location: [place] [facility];  Service: General;  Laterality: Left;   TUBAL LIGATION     WISDOM TOOTH EXTRACTION       Medications:  Outpatient Encounter Medications as of 10/09/2022  Medication Sig   amitriptyline (ELAVIL) 10 MG tablet Take 1 tablet (10 mg total) by mouth at bedtime as needed for sleep.   aspirin EC 81 MG tablet Take 1 tablet (81 mg total) by mouth daily.   aspirin-acetaminophen-caffeine (EXCEDRIN MIGRAINE) 250-250-65 MG tablet Take 1-2 tablets by mouth every 6 (six) hours as needed for headache or migraine.   ezetimibe (ZETIA) 10 MG tablet Take 1 tablet (10 mg total) by mouth daily.   fluticasone (FLONASE) 50 MCG/ACT nasal spray Place 2 sprays into both nostrils daily. (Patient taking differently: Place 1 spray into both nostrils daily.)   hydrochlorothiazide (HYDRODIURIL) 25 MG tablet Take 1 tablet (25 mg total) by mouth daily.   hydrOXYzine (ATARAX) 10 MG tablet Take 1-3 tablets (10-30 mg total) by mouth 2 (two) times daily as needed.   nystatin-triamcinolone ointment (MYCOLOG) Apply 1 Application topically 2 (two) times daily.   Omega-3 1000 MG CAPS Take 2,000 mg by mouth.   ondansetron (ZOFRAN) 4 MG tablet Take 1-2 tablets (4-8 mg total) by mouth every 8 (eight) hours as needed for nausea or vomiting.   OVER THE COUNTER MEDICATION Allergy medication   rizatriptan (MAXALT) 5 MG tablet Take 1 tablet (5 mg total) by mouth as needed for migraine. May repeat in 2 hours if needed   rosuvastatin (CRESTOR) 10 MG tablet Take 10 mg by mouth daily.   tirzepatide [facility]) 2.5 MG/0.5ML Pen  Inject 2.5 mg into the skin once a week.   topiramate (TOPAMAX) 50 MG tablet Take 1 tablet (50 mg total) by mouth daily.   venlafaxine XR (EFFEXOR-XR) 150 MG 24 hr capsule TAKE 1 CAPSULE BY MOUTH EVERY DAY   No facility-administered encounter medications on file as of 10/09/2022.    Allergies:  Allergies  Allergen Reactions   Chlorhexidine Other (See Comments)    Red and purple burns and peeled skin. Cannot use surgical glue.  Pt states Betadine is safe and without reaction.   Crestor [Rosuvastatin] Swelling    Swelling in her hands   Neomycin     UNSPECIFIED REACTION    Other Other (See Comments)    Body glue, turns skin blue   Sulfamethoxazole-Trimethoprim     UNSPECIFIED REACTION    Sulfasalazine Other (See Comments)   Sulfonamide Derivatives     UNSPECIFIED REACTION    Augmentin [Amoxicillin-Pot Clavulanate] Nausea And Vomiting    amox ok.  Hydrocodone Nausea And Vomiting    Severe vomiting   Oxycodone Nausea And Vomiting    Severe vomiting   Prednisone Other (See Comments)    insomnia    Family History: Family History  Problem Relation Age of Onset   Arthritis Mother    Dementia Mother 29   Stroke Mother    Osteoporosis Mother    Alcohol abuse Father    Arthritis Father    Hyperlipidemia Father    Stroke Father        2012   Depression Father    Diabetes Father    Heart disease Father 75   Heart failure Father    Heart attack Father    Diabetes Sister    Lung cancer Sister        smoker   Breast cancer Maternal Grandmother 52   Dementia Maternal Grandmother    Heart disease Maternal Grandfather 58   CAD Maternal Grandfather    Diabetes Paternal Grandmother    COPD Paternal Grandfather        emphysema   CAD Maternal Uncle    Colon cancer Maternal Uncle    Cancer Maternal Uncle    CAD Paternal Aunt    Congestive Heart Failure Paternal Aunt    Kidney failure Paternal Aunt    Diabetes Paternal Aunt    Cancer Paternal Uncle        breast cancer    Colon cancer Paternal Uncle    Esophageal cancer Neg Hx    Stomach cancer Neg Hx    Rectal cancer Neg Hx     Social History: Social History   Tobacco Use   Smoking status: Never    Passive exposure: Past   Smokeless tobacco: Never  Vaping Use   Vaping Use: Never used  Substance Use Topics   Alcohol use: Yes    Alcohol/week: 0.0 standard drinks of alcohol    Comment: rarely   Drug use: No   Social History   Social History Narrative   Regular exercise: walking 1 mile per day   Caffeine use: soda drinker   Lives with husband and 2 children in a one story home.     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      Vital Signs:  BP 132/80   Pulse 69   Ht '5\' 4"'$  (1.626 m)   Wt 247 lb (112 kg)   LMP 08/23/2017   SpO2 96%   BMI 42.40 kg/m   Neurological Exam: MENTAL STATUS including orientation to time, place, person, recent and remote memory, attention span and concentration, language, and fund of knowledge is normal.  Speech is not dysarthric.  CRANIAL NERVES: II:  No visual field defects.     III-IV-VI: Pupils equal round and reactive to light.  Normal conjugate, extra-ocular eye movements in all directions of gaze.  No nystagmus.  No ptosis.   V:  Normal facial sensation.    VII:  Normal facial symmetry and movements.   VIII:  Normal hearing and vestibular function.   IX-X:  Normal palatal movement.   XI:  Normal shoulder shrug and head rotation.   XII:  Normal tongue strength and range of motion, no deviation or fasciculation.  MOTOR:  Motor strength is 5/5 throughout.  No atrophy, fasciculations or abnormal movements.  No pronator drift.   MSRs:  Right        Left brachioradialis 2+  2+  biceps 2+  2+  triceps 2+  2+  patellar 2+  2+  ankle jerk 2+  2+  Hoffman no  no  plantar response down  down   SENSORY:  Normal and symmetric perception  of light touch and vibration throughout  COORDINATION/GAIT: Normal finger-to- nose-finger.  Intact rapid alternating movements bilaterally.  Gait narrow based and stable.   Thank you for allowing me to participate in patient's care.  If I can answer any additional questions, I would be pleased to do so.    Sincerely,    Constantina Laseter K. Posey Pronto, DO

## 2022-10-14 ENCOUNTER — Encounter: Payer: Self-pay | Admitting: Family Medicine

## 2022-10-14 ENCOUNTER — Ambulatory Visit: Payer: BC Managed Care – PPO | Admitting: Family Medicine

## 2022-10-14 VITALS — BP 106/72 | HR 81 | Temp 98.3°F | Ht 64.0 in | Wt 241.4 lb

## 2022-10-14 DIAGNOSIS — R051 Acute cough: Secondary | ICD-10-CM

## 2022-10-14 DIAGNOSIS — J988 Other specified respiratory disorders: Secondary | ICD-10-CM

## 2022-10-14 DIAGNOSIS — U071 COVID-19: Secondary | ICD-10-CM | POA: Diagnosis not present

## 2022-10-14 LAB — POCT INFLUENZA A/B
Influenza A, POC: NEGATIVE
Influenza B, POC: NEGATIVE

## 2022-10-14 LAB — POC COVID19 BINAXNOW: SARS Coronavirus 2 Ag: POSITIVE — AB

## 2022-10-14 MED ORDER — NIRMATRELVIR/RITONAVIR (PAXLOVID)TABLET: 3.0000 | ORAL_TABLET | Freq: Two times a day (BID) | ORAL | 0 refills | Status: AC

## 2022-10-14 NOTE — Patient Instructions (Signed)
Do not take your crestor when you are taking paxlovid

## 2022-10-14 NOTE — Progress Notes (Signed)
OFFICE VISIT  10/14/2022  CC:  Chief Complaint  Patient presents with   Sore Throat   Cough    Pt c/o nasal cong, difficulty breathing, chills, fever (x2), fatigue. Pt believes it may be flu. Denies covid or flu exposure. Started as migraine on Fri, took migraine med, slept all day Fri/Sat, took [place] on Sat, regular night meds, took advil or tylenol, nightquil on Sun    Patient is a 52 y.o. female who presents for respiratory concerns.  HPI: Onset 2 d/a: URI sx's, HA, fever/chills, cough, fatigue. No wheezing, shortness of breath, nausea, vomiting, or diarrhea.  Past Medical History:  Diagnosis Date   Abnormality of right breast on screening mammogram 10/09/2018   Allergy    Atypical chest pain 06/07/2019   Atypical migraine    Claudication in peripheral vascular disease ([place]) 07/10/2021   Colon polyps 2011   3 polpys removed   Complication of anesthesia    "severe Reaction" "Physically ill"    Deviated septum    Endometriosis    Family history of adverse reaction to anesthesia    violent when waking up from anesthesia - very sick   Hepatic cyst    chronic- stable by yearly PET performed by onc   Hypertension    Hypoglycemic syndrome 1996   during pregnancy   Hypothyroidism    Impingement syndrome of right shoulder region 06/04/2018   Kidney stone    Lymphedema of right arm    Melanoma ([place])    03/10/2018   PONV (postoperative nausea and vomiting)    Shoulder injury    right - side happened from last surgery   Thyroid disease    Varicose vein of leg     Past Surgical History:  Procedure Laterality Date   BREAST LUMPECTOMY WITH RADIOACTIVE SEED LOCALIZATION Right 10/09/2018   Procedure: RIGHT BREAST LUMPECTOMY [place];  Surgeon: Fanny Skates, MD;  Location: [facility];  Service: General;  Laterality: Right;   COLONOSCOPY     polyps   ENDOVENOUS ABLATION SAPHENOUS VEIN W/ LASER Left 01/31/2022    endovenous laser ablation left greater saphenous vein and stab phlebectomy<10 incisions left leg by Harold Barban MD   LAPAROSCOPIC TOTAL HYSTERECTOMY  09/04/2017   Dr. Gaetano Net for endometriosis   MELANOMA EXCISION WITH SENTINEL LYMPH NODE BIOPSY Left 04/02/2018   Procedure: INJECT BLUE DYE MELANOMA LEFT BACK WITH  WIDE LOCAL EXCISION MELANOMA LEFT BACK BILATERAL AXILLARY DEEP SENTINEL LYMPH NODE BIOPSY;  Surgeon: Fanny Skates, MD;  Location: [facility];  Service: General;  Laterality: Left;   TUBAL LIGATION     WISDOM TOOTH EXTRACTION      Outpatient Medications Prior to Visit  Medication Sig Dispense Refill   amitriptyline (ELAVIL) 10 MG tablet Take 1 tablet (10 mg total) by mouth at bedtime as needed for sleep. 24 tablet 1   aspirin EC 81 MG tablet Take 1 tablet (81 mg total) by mouth daily. 90 tablet 3   aspirin-acetaminophen-caffeine (EXCEDRIN MIGRAINE) 250-250-65 MG tablet Take 1-2 tablets by mouth every 6 (six) hours as needed for headache or migraine.     ezetimibe (ZETIA) 10 MG tablet Take 1 tablet (10 mg total) by mouth daily. 90 tablet 3   fluticasone (FLONASE) 50 MCG/ACT nasal spray Place 2 sprays into both nostrils daily. (Patient taking differently: Place 1 spray into both nostrils daily.) 16 g 11   hydrochlorothiazide (HYDRODIURIL) 25 MG tablet Take 1 tablet (25 mg total) by  mouth daily. 90 tablet 1   hydrOXYzine (ATARAX) 10 MG tablet Take 1-3 tablets (10-30 mg total) by mouth 2 (two) times daily as needed. 90 tablet 5   nystatin-triamcinolone ointment (MYCOLOG) Apply 1 Application topically 2 (two) times daily. 30 g 0   Omega-3 1000 MG CAPS Take 2,000 mg by mouth.     ondansetron (ZOFRAN) 4 MG tablet Take 1-2 tablets (4-8 mg total) by mouth every 8 (eight) hours as needed for nausea or vomiting. 40 tablet 5   OVER THE COUNTER MEDICATION Allergy medication     rizatriptan (MAXALT) 10 MG tablet Take 1 tablet (10 mg total) by mouth as needed for migraine. May  repeat in 2 hours if needed 10 tablet 0   rosuvastatin (CRESTOR) 10 MG tablet Take 10 mg by mouth daily.     tirzepatide [facility]) 2.5 MG/0.5ML Pen Inject 2.5 mg into the skin once a week. 2 mL 0   topiramate (TOPAMAX) 50 MG tablet Take 1.5 tablets (75 mg total) by mouth daily. 135 tablet 1   venlafaxine XR (EFFEXOR-XR) 150 MG 24 hr capsule TAKE 1 CAPSULE BY MOUTH EVERY DAY 90 capsule 1   No facility-administered medications prior to visit.    Allergies  Allergen Reactions   Chlorhexidine Other (See Comments)    Red and purple burns and peeled skin. Cannot use surgical glue.  Pt states Betadine is safe and without reaction.   Crestor [Rosuvastatin] Swelling    Swelling in her hands   Neomycin     UNSPECIFIED REACTION    Other Other (See Comments)    Body glue, turns skin blue   Sulfamethoxazole-Trimethoprim     UNSPECIFIED REACTION    Sulfasalazine Other (See Comments)   Sulfonamide Derivatives     UNSPECIFIED REACTION    Augmentin [Amoxicillin-Pot Clavulanate] Nausea And Vomiting    amox ok.    Hydrocodone Nausea And Vomiting    Severe vomiting   Oxycodone Nausea And Vomiting    Severe vomiting   Prednisone Other (See Comments)    insomnia    Review of Systems  As per HPI  PE:    10/14/2022    3:52 PM 10/09/2022    8:10 AM 09/02/2022    1:48 PM  Vitals with BMI  Height '5\' 4"'$  '5\' 4"'$    Weight 241 lbs 6 oz 247 lbs 248 lbs 3 oz  BMI [id number]   Systolic [id number]  Diastolic 72 80 73  Pulse 81 69 67  02 sat 95% RA  Physical Exam  VS: noted--normal. Gen: alert, NAD, NONTOXIC APPEARING. HEENT: eyes without injection, drainage, or swelling.  Ears: EACs clear, TMs with normal light reflex and landmarks.  Nose: Clear rhinorrhea, with some dried, crusty exudate adherent to mildly injected mucosa.  No purulent d/c.  No paranasal sinus TTP.  No facial swelling.  Throat and mouth without focal lesion.  No pharyngial swelling, erythema, or exudate.   Neck: supple, no LAD.    LUNGS: CTA bilat, nonlabored resps.   CV: RRR, no m/r/g. EXT: no c/c/e SKIN: no rash   LABS:  Last metabolic panel Lab Results  Component Value Date   GLUCOSE 125 (H) 07/31/2022   NA 140 07/31/2022   K 3.9 07/31/2022   CL 108 07/31/2022   CO2 25 07/31/2022   BUN 15 07/31/2022   CREATININE 0.97 07/31/2022   GFRNONAA >60 07/31/2022   CALCIUM 9.9 07/31/2022   PROT 7.8 07/31/2022   ALBUMIN 4.2 07/31/2022  LABGLOB 2.6 12/07/2021   AGRATIO 1.6 12/07/2021   BILITOT 0.5 07/31/2022   ALKPHOS 86 07/31/2022   AST 64 (H) 07/31/2022   ALT 97 (H) 07/31/2022   ANIONGAP 7 07/31/2022   IMPRESSION AND PLAN:  COVID respiratory infection. Covid test POSITIVE here today. Patient at high risk for complication from [place]. GFR 66. Paxlovid prescribed. Hold Crestor while taking Paxlovid. Mucinex DM every 12 hours as needed. Tylenol or Motrin q6h prn.  An After Visit Summary was printed and given to the patient.  FOLLOW UP: Return if symptoms worsen or fail to improve.  Signed:  Crissie Sickles, MD           10/14/2022

## 2022-10-23 ENCOUNTER — Telehealth: Payer: Self-pay

## 2022-10-23 NOTE — Telephone Encounter (Signed)
LM for pt to return call to discuss.   Please schedule pt in next available slot

## 2022-10-23 NOTE — Telephone Encounter (Signed)
[facility] - Client TELEPHONE ADVICE RECORD [facility] Patient Name: Shannon Quinn Gender: Female DOB: 09-Apr-1971 Age: 52 Y 38 M 10 D Return Phone Number: [phone] (Primary), [phone] (Secondary) Address: City/State/Zip: [place] [address] Client [facility] - Client Client Site [facility] - Day Provider Raoul Pitch, [place] Contact Type Call Who Is Calling Patient / Member / Family / Caregiver Call Type Triage / Clinical Relationship To Patient Self Return Phone Number [phone] (Primary) Chief Complaint Nausea Reason for Call Symptomatic / Request for [place] states that she is transferring a patient who had COVID recently and she is still very sore, nauseated with some fatigue. She is also having eye discharge that started last Wednesday. The office doesn't have any openings today. [place] Not Listed UC unknown location. Translation No Nurse Assessment Nurse: Rosana Hoes, RN, Luvenia Starch Date/Time (Eastern Time): 10/22/2022 2:55:10 PM Confirm and document reason for call. If symptomatic, describe symptoms. ---Caller states diagnosed with covid on 3/4, finished Paxlovid, c/o body aches, nausea, fatigue, cough, sinus drainage, eye discharge started Wednesday. Decreased appetite, however normal fluid intake, has urinated in the last 8 hours. Afebrile. Spoke with office and they have no openings. Does the patient have any new or worsening symptoms? ---Yes Will a triage be completed? ---Yes Related visit to physician within the last 2 weeks? ---Yes Does the PT have any chronic conditions? (i.e. diabetes, asthma, this includes High risk factors for pregnancy, etc.) ---Yes List chronic conditions. ---migraines, cancer in remission, htn controlled with medications, hyperlipidemia Is the patient pregnant or possibly pregnant? (Ask all females between the ages of 80-55) ---No Is  this a behavioral health or substance abuse call? ---No PLEASE NOTE: All timestamps contained within this report are represented as [place] Standard Time. CONFIDENTIALTY NOTICE: This fax transmission is intended only for the addressee. It contains information that is legally privileged, confidential or otherwise protected from use or disclosure. If you are not the intended recipient, you are strictly prohibited from reviewing, disclosing, copying using or disseminating any of this information or taking any action in reliance on or regarding this information. If you have received this fax in error, please notify us immediately by telephone so that we can arrange for its return to [name]. Phone: [phone], Toll-Free: [phone], Fax: [phone] Page: 2 of 2 Call Id: [id number] Guidelines Guideline Title Affirmed Question Affirmed Notes Nurse Date/Time Eilene Ghazi Time) COVID-19 - Diagnosed or Suspected MILD difficulty breathing (e.g., minimal/no SOB at rest, SOB with walking, pulse <100) Rosana Hoes, RN, Luvenia Starch 10/22/2022 3:07:46 PM Disp. Time Eilene Ghazi Time) Disposition Final User 10/22/2022 3:13:53 PM See HCP within 4 Hours (or PCP triage) Yes Rosana Hoes, RN, Luvenia Starch Final Disposition 10/22/2022 3:13:53 PM See HCP within 4 Hours (or PCP triage) Yes Rosana Hoes, RN, Gus Puma Disagree/Comply Comply Caller Understands Yes PreDisposition Did not know what to do Care Advice Given Per Guideline SEE HCP (OR PCP TRIAGE) WITHIN 4 HOURS: * IF OFFICE WILL BE OPEN: You need to be seen within the next 3 or 4 hours. Call your doctor (or NP/PA) now or as soon as the office opens. CALL BACK IF: * You become worse CARE ADVICE given per COVID-19 - DIAGNOSED OR SUSPECTED (Adult) guideline. Referrals GO TO FACILITY OTHER - SPECIFY

## 2022-12-23 ENCOUNTER — Ambulatory Visit
Admission: RE | Admit: 2022-12-23 | Discharge: 2022-12-23 | Disposition: A | Discharge status: Home / Self Care | Payer: BC Managed Care – PPO | Source: Ambulatory Visit | Attending: Family Medicine | Admitting: Family Medicine

## 2022-12-23 ENCOUNTER — Other Ambulatory Visit: Payer: Self-pay | Admitting: Family Medicine

## 2022-12-23 DIAGNOSIS — C779 Secondary and unspecified malignant neoplasm of lymph node, unspecified: Secondary | ICD-10-CM | POA: Diagnosis not present

## 2022-12-23 DIAGNOSIS — Z1231 Encounter for screening mammogram for malignant neoplasm of breast: Secondary | ICD-10-CM

## 2022-12-23 DIAGNOSIS — M79629 Pain in unspecified upper arm: Secondary | ICD-10-CM | POA: Diagnosis not present

## 2022-12-23 DIAGNOSIS — R222 Localized swelling, mass and lump, trunk: Secondary | ICD-10-CM | POA: Diagnosis not present

## 2022-12-23 DIAGNOSIS — C4359 Malignant melanoma of other part of trunk: Secondary | ICD-10-CM | POA: Diagnosis not present

## 2022-12-24 ENCOUNTER — Encounter: Payer: Self-pay | Admitting: Family Medicine

## 2023-01-21 ENCOUNTER — Other Ambulatory Visit: Payer: Self-pay | Admitting: Family Medicine

## 2023-02-03 ENCOUNTER — Inpatient Hospital Stay: Payer: BC Managed Care – PPO | Attending: Internal Medicine

## 2023-02-03 ENCOUNTER — Inpatient Hospital Stay: Payer: BC Managed Care – PPO | Admitting: Internal Medicine

## 2023-02-03 ENCOUNTER — Other Ambulatory Visit: Payer: Self-pay

## 2023-02-03 VITALS — BP 127/90 | HR 84 | Temp 97.9°F | Resp 18 | Ht 64.0 in | Wt 239.9 lb

## 2023-02-03 DIAGNOSIS — Z08 Encounter for follow-up examination after completed treatment for malignant neoplasm: Secondary | ICD-10-CM | POA: Diagnosis not present

## 2023-02-03 DIAGNOSIS — Z85820 Personal history of malignant melanoma of skin: Secondary | ICD-10-CM | POA: Diagnosis not present

## 2023-02-03 DIAGNOSIS — C439 Malignant melanoma of skin, unspecified: Secondary | ICD-10-CM

## 2023-02-03 LAB — CBC WITH DIFFERENTIAL/PLATELET
Abs Immature Granulocytes: 0.03 10*3/uL (ref 0.00–0.07)
Basophils Absolute: 0.1 10*3/uL (ref 0.0–0.1)
Basophils Relative: 1 %
Eosinophils Absolute: 0.3 10*3/uL (ref 0.0–0.5)
Eosinophils Relative: 4 %
HCT: 45.2 % (ref 36.0–46.0)
Hemoglobin: 15 g/dL (ref 12.0–15.0)
Immature Granulocytes: 0 %
Lymphocytes Relative: 26 %
Lymphs Abs: 1.8 10*3/uL (ref 0.7–4.0)
MCH: 30.3 pg (ref 26.0–34.0)
MCHC: 33.2 g/dL (ref 30.0–36.0)
MCV: 91.3 fL (ref 80.0–100.0)
Monocytes Absolute: 0.5 10*3/uL (ref 0.1–1.0)
Monocytes Relative: 7 %
Neutro Abs: 4.3 10*3/uL (ref 1.7–7.7)
Neutrophils Relative %: 62 %
Platelets: 277 10*3/uL (ref 150–400)
RBC: 4.95 MIL/uL (ref 3.87–5.11)
RDW: 13.6 % (ref 11.5–15.5)
WBC: 6.9 10*3/uL (ref 4.0–10.5)
nRBC: 0 % (ref 0.0–0.2)

## 2023-02-03 LAB — COMPREHENSIVE METABOLIC PANEL
ALT: 34 U/L (ref 0–44)
AST: 27 U/L (ref 15–41)
Albumin: 4 g/dL (ref 3.5–5.0)
Alkaline Phosphatase: 68 U/L (ref 38–126)
Anion gap: 10 (ref 5–15)
BUN: 17 mg/dL (ref 6–20)
CO2: 20 mmol/L — ABNORMAL LOW (ref 22–32)
Calcium: 9.6 mg/dL (ref 8.9–10.3)
Chloride: 111 mmol/L (ref 98–111)
Creatinine, Ser: 1.01 mg/dL — ABNORMAL HIGH (ref 0.44–1.00)
GFR, Estimated: >60 mL/min (ref >60)
Glucose, Bld: 132 mg/dL — ABNORMAL HIGH (ref 70–99)
Potassium: 3.4 mmol/L — ABNORMAL LOW (ref 3.5–5.1)
Sodium: 141 mmol/L (ref 135–145)
Total Bilirubin: 0.5 mg/dL (ref 0.3–1.2)
Total Protein: 7.3 g/dL (ref 6.5–8.1)

## 2023-02-03 NOTE — Progress Notes (Signed)
[facility] Telephone:(336) [phone]   [phone]  OFFICE PROGRESS NOTE  Shannon Leatherwood, DO [address] [place] [address]  DIAGNOSIS: Stage III (T2a, N1, M0) malignant melanoma of the upper back diagnosed in 2019 with 1 out of 10 lymph nodes positive for malignancy.  PRIOR THERAPY: status post wide excision with bilateral sentinel lymph node biopsy of the axilla and the final pathology on April 02, 2018 showed residual melanoma of 0.8 mm with 1 sentinel lymph node out of 10 positive for melanoma was less than 0.1 mm tiny focus.  CURRENT THERAPY: Observation  INTERVAL HISTORY: Shannon Quinn 52 y.o. female came to the clinic today to establish care with me after her primary oncologist Dr. Clelia Croft left the practice.  The patient is doing fine today with no concerning complaints except for intermittent epistaxis that has been going on for years.  She denied having any chest pain, shortness of breath, cough or hemoptysis.  She has no nausea, vomiting, diarrhea or constipation.  She has no headache or visual changes.  She denied having any recent weight loss or night sweats.  She is here today for evaluation and repeat blood work. The patient has a strong family of heart disease in her mother and father who had heart disease in their 67s. She is married and has 2 children.  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  She has no history for smoking, alcohol or drug abuse.  MEDICAL HISTORY: Past Medical History:  Diagnosis Date   Abnormality of right breast on screening mammogram 10/09/2018   Allergy    Atypical chest pain 06/07/2019   Atypical migraine    Claudication in peripheral vascular disease ([facility]) 07/10/2021   Colon polyps 2011   3 polpys removed   Complication of anesthesia    "severe Reaction" "Physically ill"    Deviated septum    Endometriosis    Family history of adverse reaction to anesthesia    violent when waking up from anesthesia  - very sick   Hepatic cyst    chronic- stable by yearly PET performed by onc   Hypertension    Hypoglycemic syndrome 1996   during pregnancy   Hypothyroidism    Impingement syndrome of right shoulder region 06/04/2018   Kidney stone    Lymphedema of right arm    Melanoma ([facility])    03/10/2018   PONV (postoperative nausea and vomiting)    Shoulder injury    right - side happened from last surgery   Thyroid disease    Varicose vein of leg     ALLERGIES:  is allergic to chlorhexidine, crestor [rosuvastatin], neomycin, other, sulfamethoxazole-trimethoprim, sulfasalazine, sulfonamide derivatives, augmentin [amoxicillin-pot clavulanate], hydrocodone, oxycodone, and prednisone.  MEDICATIONS:  Current Outpatient Medications  Medication Sig Dispense Refill   amitriptyline (ELAVIL) 10 MG tablet Take 1 tablet (10 mg total) by mouth at bedtime as needed for sleep. 24 tablet 1   aspirin EC 81 MG tablet Take 1 tablet (81 mg total) by mouth daily. 90 tablet 3   aspirin-acetaminophen-caffeine (EXCEDRIN MIGRAINE) 250-250-65 MG tablet Take 1-2 tablets by mouth every 6 (six) hours as needed for headache or migraine.     ezetimibe (ZETIA) 10 MG tablet Take 1 tablet (10 mg total) by mouth daily. 90 tablet 3   fluticasone (FLONASE) 50 MCG/ACT nasal spray Place 2 sprays into both nostrils daily. (Patient taking differently: Place 1 spray into both nostrils daily.) 16 g  11   hydrochlorothiazide (HYDRODIURIL) 25 MG tablet Take 1 tablet (25 mg total) by mouth daily. 90 tablet 1   hydrOXYzine (ATARAX) 10 MG tablet Take 1-3 tablets (10-30 mg total) by mouth 2 (two) times daily as needed. 90 tablet 5   nystatin-triamcinolone ointment (MYCOLOG) Apply 1 Application topically 2 (two) times daily. 30 g 0   Omega-3 1000 MG CAPS Take 2,000 mg by mouth.     ondansetron (ZOFRAN) 4 MG tablet Take 1-2 tablets (4-8 mg total) by mouth every 8 (eight) hours as needed for nausea or vomiting. 40 tablet 5   OVER THE COUNTER  MEDICATION Allergy medication     rizatriptan (MAXALT) 10 MG tablet Take 1 tablet (10 mg total) by mouth as needed for migraine. May repeat in 2 hours if needed 10 tablet 0   rosuvastatin (CRESTOR) 10 MG tablet Take 10 mg by mouth daily.     tirzepatide [facility]) 2.5 MG/0.5ML Pen Inject 2.5 mg into the skin once a week. 2 mL 0   topiramate (TOPAMAX) 50 MG tablet Take 1.5 tablets (75 mg total) by mouth daily. 135 tablet 1   venlafaxine XR (EFFEXOR-XR) 150 MG 24 hr capsule TAKE 1 CAPSULE BY MOUTH EVERY DAY 30 capsule 0   No current facility-administered medications for this visit.    SURGICAL HISTORY:  Past Surgical History:  Procedure Laterality Date   BREAST LUMPECTOMY WITH RADIOACTIVE SEED LOCALIZATION Right 10/09/2018   Procedure: RIGHT BREAST LUMPECTOMY WITH RADIOACTIVE SEED LOCALIZATION [facility];  Surgeon: Claud Kelp, MD;  Location: [facility];  Service: General;  Laterality: Right;   COLONOSCOPY     polyps   ENDOVENOUS ABLATION SAPHENOUS VEIN W/ LASER Left 01/31/2022   endovenous laser ablation left greater saphenous vein and stab phlebectomy<10 incisions left leg by Coral Else MD   LAPAROSCOPIC TOTAL HYSTERECTOMY  09/04/2017   Dr. Henderson Cloud for endometriosis   MELANOMA EXCISION WITH SENTINEL LYMPH NODE BIOPSY Left 04/02/2018   Procedure: INJECT BLUE DYE MELANOMA LEFT BACK WITH  WIDE LOCAL EXCISION MELANOMA LEFT BACK BILATERAL AXILLARY DEEP SENTINEL LYMPH NODE BIOPSY;  Surgeon: Claud Kelp, MD;  Location: [facility];  Service: General;  Laterality: Left;   TUBAL LIGATION     WISDOM TOOTH EXTRACTION      REVIEW OF SYSTEMS:  Constitutional: negative Eyes: negative Ears, nose, mouth, throat, and face: positive for epistaxis Respiratory: negative Cardiovascular: negative Gastrointestinal: negative Genitourinary:negative Integument/breast: negative Hematologic/lymphatic: negative Musculoskeletal:negative Neurological:  negative Behavioral/Psych: negative Endocrine: negative Allergic/Immunologic: negative   PHYSICAL EXAMINATION: General appearance: alert, cooperative, and no distress Head: Normocephalic, without obvious abnormality, atraumatic Neck: no adenopathy, no JVD, supple, symmetrical, trachea midline, and thyroid not enlarged, symmetric, no tenderness/mass/nodules Lymph nodes: Cervical, supraclavicular, and axillary nodes normal. Resp: clear to auscultation bilaterally Back: symmetric, no curvature. ROM normal. No CVA tenderness. Cardio: regular rate and rhythm, S1, S2 normal, no murmur, click, rub or gallop GI: soft, non-tender; bowel sounds normal; no masses,  no organomegaly Extremities: extremities normal, atraumatic, no cyanosis or edema Neurologic: Alert and oriented X 3, normal strength and tone. Normal symmetric reflexes. Normal coordination and gait  ECOG PERFORMANCE STATUS: 1 - Symptomatic but completely ambulatory  Blood pressure (!) 127/90, pulse 84, temperature 97.9 F (36.6 C), temperature source Oral, resp. rate 18, height 5\' 4"  (1.626 m), weight 239 lb 14.4 oz (108.8 kg), last menstrual period 08/23/2017, SpO2 95 %.  LABORATORY DATA: Lab Results  Component Value Date   WBC 6.0 07/31/2022   HGB  14.4 07/31/2022   HCT 43.7 07/31/2022   MCV 91.6 07/31/2022   PLT 278 07/31/2022      Chemistry      Component Value Date/Time   NA 140 07/31/2022 1033   NA 140 12/07/2021 1218   K 3.9 07/31/2022 1033   CL 108 07/31/2022 1033   CO2 25 07/31/2022 1033   BUN 15 07/31/2022 1033   BUN 11 12/07/2021 1218   CREATININE 0.97 07/31/2022 1033   CREATININE 0.88 06/01/2019 1556   GLU 87 08/30/2017 0000      Component Value Date/Time   CALCIUM 9.9 07/31/2022 1033   ALKPHOS 86 07/31/2022 1033   AST 64 (H) 07/31/2022 1033   ALT 97 (H) 07/31/2022 1033   BILITOT 0.5 07/31/2022 1033       RADIOGRAPHIC STUDIES: No results found.  ASSESSMENT AND PLAN: This is a very pleasant 52  years old white female with a stage III (T2 a, N1, M0) melanoma of the skin of the upper back with 1 positive sentinel lymph node diagnosed in August 2019 status post wide excision and bilateral sentinel lymph node biopsy of the axilla. The patient has been on observation since that time.  She is feeling fine with no concerning complaints.  Her last PET scan in December 2023 showed no concerning findings for disease recurrence or metastasis. I recommended for the patient to continue on observation with repeat blood work in 6 months. She was strongly advised to use sunscreen and also has a routine skin exam by her dermatologist. She was advised to call immediately if she has any other concerning symptoms in the interval. The patient voices understanding of current disease status and treatment options and is in agreement with the current care plan.  All questions were answered. The patient knows to call the clinic with any problems, questions or concerns. We can certainly see the patient much sooner if necessary.  The total time spent in the appointment was 30 minutes.  Disclaimer: This note was dictated with voice recognition software. Similar sounding words can inadvertently be transcribed and may not be corrected upon review.

## 2023-02-07 ENCOUNTER — Other Ambulatory Visit (HOSPITAL_BASED_OUTPATIENT_CLINIC_OR_DEPARTMENT_OTHER): Payer: Self-pay | Admitting: Cardiovascular Disease

## 2023-02-11 ENCOUNTER — Ambulatory Visit: Payer: BC Managed Care – PPO | Admitting: Family Medicine

## 2023-02-11 ENCOUNTER — Encounter: Payer: Self-pay | Admitting: Family Medicine

## 2023-02-11 VITALS — BP 99/63 | HR 88 | Temp 98.5°F | Wt 238.8 lb

## 2023-02-11 DIAGNOSIS — F32A Depression, unspecified: Secondary | ICD-10-CM

## 2023-02-11 DIAGNOSIS — N951 Menopausal and female climacteric states: Secondary | ICD-10-CM

## 2023-02-11 DIAGNOSIS — F419 Anxiety disorder, unspecified: Secondary | ICD-10-CM | POA: Diagnosis not present

## 2023-02-11 DIAGNOSIS — F4321 Adjustment disorder with depressed mood: Secondary | ICD-10-CM | POA: Diagnosis not present

## 2023-02-11 MED ORDER — LORAZEPAM 0.5 MG PO TABS: 0.2500 mg | ORAL_TABLET | Freq: Two times a day (BID) | ORAL | 5 refills | Status: DC | PRN

## 2023-02-11 MED ORDER — VENLAFAXINE HCL ER 150 MG PO CP24: 150.0000 mg | ORAL_CAPSULE | Freq: Every day | ORAL | 1 refills | Status: DC

## 2023-02-11 NOTE — Patient Instructions (Addendum)
Return in about 24 weeks (around 07/29/2023).        Great to see you today.  I have refilled the medication(s) we provide.
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
## 2023-02-12 DIAGNOSIS — F4321 Adjustment disorder with depressed mood: Secondary | ICD-10-CM | POA: Insufficient documentation

## 2023-02-17 ENCOUNTER — Ambulatory Visit: Payer: BC Managed Care – PPO | Admitting: Neurology

## 2023-02-17 ENCOUNTER — Encounter: Payer: Self-pay | Admitting: Neurology

## 2023-02-17 VITALS — BP 133/72 | HR 80 | Ht 64.0 in | Wt 244.0 lb

## 2023-02-17 DIAGNOSIS — G43701 Chronic migraine without aura, not intractable, with status migrainosus: Secondary | ICD-10-CM | POA: Diagnosis not present

## 2023-02-17 DIAGNOSIS — M542 Cervicalgia: Secondary | ICD-10-CM

## 2023-02-17 MED ORDER — NURTEC 75 MG PO TBDP: 75.0000 mg | ORAL_TABLET | ORAL | 3 refills | Status: DC | PRN

## 2023-02-17 MED ORDER — TOPIRAMATE 50 MG PO TABS: 50.0000 mg | ORAL_TABLET | Freq: Two times a day (BID) | ORAL | 1 refills | Status: DC

## 2023-02-17 NOTE — Progress Notes (Signed)
Follow-up Visit   Date: 02/17/2023    Shannon Quinn MRN: [id number] DOB: February 10, 1971    Shannon Quinn is a 52 y.o. Caucasian female with melanoma, hypertension, hyperlipidemia, depression/anxiety returning to the clinic for follow-up of migraine.  The patient was accompanied to the clinic by self.     IMPRESSION/PLAN: Chronic migraine  - She takes amitriptyline for insomnia and venlafaxine 150mg  for depression, so will not add TCA or SNRI  - Increase topiramate to 50mg  twice daily  - Start Nurtec 75mg  for abortive therapy  - Discontinue maxalt  Return to clinic in 4 months  --------------------------------------------- History of present illness: Headaches started in 2019 after having melanoma resection in 2019.  Pain starts in variable locations, usually stress-related pain starts in the base of the neck and radiates of her head.  Weather-related triggers are sharp and occur in random locations.  Pain is characterized as tight-pressure, sharp stabbing pain, or throbbing.  Pain lasts ~2-7 days, on average usually about 3 days.  She endorses photophobia, phonophobia, nausea, and vomiting.  She hears sounds like cicadas prior to headache onset and throughout the duration.  She is currently on topiramate 50mg  daily and tolerating it well.  She does feel that it helps.  For rescue, she takes tylenol, excedrin, and ibuprofen which she takes 3 days per week.  She takes maxalt 3-4 times per month which helps. She also takes atarax and venlafaxine 150mg  which she takes for hot flashes.   She takes amitriptyline 10mg  at bedtime for sleep.  She is very sedated so only takes it on the weekend.    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 She endorses neck tension and feels that stretching helps.  She has done neck PT about 2 years ago.  MRI brain in 2021 was normal.   UPDATE 02/17/2023:  She is here for follow-up visit.  She continues to have headaches about 2-4 days per month, which  is responsive to rizatriptan. However, she does report to taking rizatriptan every 2 hours until headache is resolved.  She was not clear on the instructed to limited to twice in 24 hours.  She feels that her headaches would be better, but she recently has been under a lot of stress - sister with stage 4 cancer and dog who passed last week.   Medications:  Current Outpatient Medications on File Prior to Visit  Medication Sig Dispense Refill   amitriptyline (ELAVIL) 10 MG tablet Take 1 tablet (10 mg total) by mouth at bedtime as needed for sleep. 24 tablet 1   aspirin-acetaminophen-caffeine (EXCEDRIN MIGRAINE) 250-250-65 MG tablet Take 1-2 tablets by mouth every 6 (six) hours as needed for headache or migraine.     ezetimibe (ZETIA) 10 MG tablet Take 1 tablet (10 mg total) by mouth daily. 90 tablet 3   fluticasone (FLONASE) 50 MCG/ACT nasal spray Place 2 sprays into both nostrils daily. (Patient taking differently: Place 1 spray into both nostrils daily.) 16 g 11   hydrochlorothiazide (HYDRODIURIL) 25 MG tablet Take 1 tablet (25 mg total) by mouth daily. 90 tablet 1   hydrOXYzine (ATARAX) 10 MG tablet Take 1-3 tablets (10-30 mg total) by mouth 2 (two) times daily as needed. 90 tablet 5   LORazepam (ATIVAN) 0.5 MG tablet Take 0.5-1 tablets (0.25-0.5 mg total) by mouth 2 (two) times daily as needed for anxiety. 60 tablet 5   nystatin-triamcinolone ointment (MYCOLOG) Apply 1 Application topically 2 (two) times daily. 30 g 0  Omega-3 1000 MG CAPS Take 2,000 mg by mouth.     ondansetron (ZOFRAN) 4 MG tablet Take 1-2 tablets (4-8 mg total) by mouth every 8 (eight) hours as needed for nausea or vomiting. 40 tablet 5   OVER THE COUNTER MEDICATION Allergy medication     rizatriptan (MAXALT) 10 MG tablet Take 1 tablet (10 mg total) by mouth as needed for migraine. May repeat in 2 hours if needed 10 tablet 0   rosuvastatin (CRESTOR) 10 MG tablet Take 10 mg by mouth daily.     topiramate (TOPAMAX) 50 MG tablet  Take 1.5 tablets (75 mg total) by mouth daily. 135 tablet 1   venlafaxine XR (EFFEXOR-XR) 150 MG 24 hr capsule Take 1 capsule (150 mg total) by mouth daily with breakfast. 90 capsule 1   aspirin EC 81 MG tablet Take 1 tablet (81 mg total) by mouth daily. 90 tablet 3   No current facility-administered medications on file prior to visit.    Allergies:  Allergies  Allergen Reactions   Chlorhexidine Other (See Comments)    Red and purple burns and peeled skin. Cannot use surgical glue.  Pt states Betadine is safe and without reaction.   Crestor [Rosuvastatin] Swelling    Swelling in her hands   Neomycin     UNSPECIFIED REACTION    Other Other (See Comments)    Body glue, turns skin blue   Sulfamethoxazole-Trimethoprim     UNSPECIFIED REACTION    Sulfasalazine Other (See Comments)   Sulfonamide Derivatives     UNSPECIFIED REACTION    Augmentin [Amoxicillin-Pot Clavulanate] Nausea And Vomiting    amox ok.    Hydrocodone Nausea And Vomiting    Severe vomiting   Oxycodone Nausea And Vomiting    Severe vomiting   Prednisone Other (See Comments)    insomnia    Vital Signs:  BP 133/72   Pulse 80   Ht 5\' 4"  (1.626 m)   Wt 244 lb (110.7 kg)   LMP 08/23/2017   SpO2 96%   BMI 41.88 kg/m    Neurological Exam: MENTAL STATUS including orientation to time, place, person, recent and remote memory, attention span and concentration, language, and fund of knowledge is normal.  Speech is not dysarthric.  CRANIAL NERVES:   Pupils equal round and reactive to light.  Normal conjugate, extra-ocular eye movements in all directions of gaze.  No ptosis .  Face is symmetric. Palate elevates symmetrically.  Tongue is midline.  MOTOR:  Motor strength is 5/5 in all extremities.  No atrophy, fasciculations or abnormal movements.  No pronator drift.  Tone is normal.    COORDINATION/GAIT:  Normal finger-to- nose-finger.  Intact rapid alternating movements bilaterally.  Gait narrow based and stable.    Data: n/a     Thank you for allowing me to participate in patient's care.  If I can answer any additional questions, I would be pleased to do so.    Sincerely,    Amias Hutchinson K. Allena Katz, DO

## 2023-02-17 NOTE — Patient Instructions (Signed)
For severe migraine, try Nurtec 75mg   Increase topiramate 100mg  daily  Stop rizatriptan  Return to clinic in 4 months

## 2023-02-18 ENCOUNTER — Other Ambulatory Visit: Payer: Self-pay | Admitting: Family Medicine

## 2023-02-24 DIAGNOSIS — D224 Melanocytic nevi of scalp and neck: Secondary | ICD-10-CM | POA: Diagnosis not present

## 2023-02-24 DIAGNOSIS — Z85828 Personal history of other malignant neoplasm of skin: Secondary | ICD-10-CM | POA: Diagnosis not present

## 2023-02-24 DIAGNOSIS — Z85820 Personal history of malignant melanoma of skin: Secondary | ICD-10-CM | POA: Diagnosis not present

## 2023-02-24 DIAGNOSIS — D2222 Melanocytic nevi of left ear and external auricular canal: Secondary | ICD-10-CM | POA: Diagnosis not present

## 2023-03-21 ENCOUNTER — Other Ambulatory Visit: Payer: Self-pay | Admitting: Family Medicine

## 2023-05-12 ENCOUNTER — Other Ambulatory Visit: Payer: Self-pay | Admitting: Family Medicine

## 2023-06-16 ENCOUNTER — Telehealth: Payer: Self-pay | Admitting: Neurology

## 2023-06-16 MED ORDER — TOPIRAMATE 50 MG PO TABS: 50.0000 mg | ORAL_TABLET | Freq: Two times a day (BID) | ORAL | 0 refills | Status: DC

## 2023-06-16 NOTE — Telephone Encounter (Signed)
One month supply sent in to pt has appointment coming up

## 2023-06-16 NOTE — Telephone Encounter (Signed)
Patient called and would like a med refill on the  topiramate (TOPAMAX) 50 MG tablet  Please send to [facility] [place]

## 2023-06-17 ENCOUNTER — Other Ambulatory Visit: Payer: Self-pay | Admitting: Neurology

## 2023-06-24 ENCOUNTER — Ambulatory Visit: Payer: BC Managed Care – PPO | Admitting: Neurology

## 2023-06-24 ENCOUNTER — Encounter: Payer: Self-pay | Admitting: Neurology

## 2023-06-24 ENCOUNTER — Other Ambulatory Visit (INDEPENDENT_AMBULATORY_CARE_PROVIDER_SITE_OTHER): Payer: BC Managed Care – PPO

## 2023-06-24 VITALS — BP 110/72 | HR 79 | Ht 64.0 in | Wt 243.0 lb

## 2023-06-24 DIAGNOSIS — G43701 Chronic migraine without aura, not intractable, with status migrainosus: Secondary | ICD-10-CM

## 2023-06-24 DIAGNOSIS — F419 Anxiety disorder, unspecified: Secondary | ICD-10-CM

## 2023-06-24 DIAGNOSIS — G4440 Drug-induced headache, not elsewhere classified, not intractable: Secondary | ICD-10-CM | POA: Diagnosis not present

## 2023-06-24 DIAGNOSIS — R2681 Unsteadiness on feet: Secondary | ICD-10-CM | POA: Diagnosis not present

## 2023-06-24 DIAGNOSIS — F32A Depression, unspecified: Secondary | ICD-10-CM

## 2023-06-24 MED ORDER — TOPIRAMATE 50 MG PO TABS: 50.0000 mg | ORAL_TABLET | Freq: Two times a day (BID) | ORAL | 1 refills | Status: DC

## 2023-06-24 NOTE — Addendum Note (Signed)
Addended by: Gertie Baron D on: 06/24/2023 04:18 PM   Modules accepted: Orders

## 2023-06-24 NOTE — Progress Notes (Signed)
Follow-up Visit   Date: 06/24/2023    Shannon Quinn MRN: [id number] DOB: 07/12/1971    Shannon Quinn is a 52 y.o. Caucasian female with melanoma, hypertension, hyperlipidemia, depression/anxiety returning to the clinic for follow-up of migraine.  The patient was accompanied to the clinic by self.     IMPRESSION/PLAN: Chronic migraine without aura, well-controlled with migraines occurring 3-4 times per month  - Continue topiramate 50mg  BID  - She takes amitriptyline 10mg  for insomnia and venlafaxine 150mg  for depression, so unable to add TCA or SNRI  - Continue Nurtec 75mg  which is effective   2.  Unsteadiness.  Exam is normal.  - Check TSH and B12  Return to clinic in 4 months  --------------------------------------------- History of present illness: Headaches started in 2019 after having melanoma resection in 2019.  Pain starts in variable locations, usually stress-related pain starts in the base of the neck and radiates of her head.  Weather-related triggers are sharp and occur in random locations.  Pain is characterized as tight-pressure, sharp stabbing pain, or throbbing.  Pain lasts ~2-7 days, on average usually about 3 days.  She endorses photophobia, phonophobia, nausea, and vomiting.  She hears sounds like cicadas prior to headache onset and throughout the duration.  She is currently on topiramate 50mg  daily and tolerating it well.  She does feel that it helps.  For rescue, she takes tylenol, excedrin, and ibuprofen which she takes 3 days per week.  She takes maxalt 3-4 times per month which helps. She also takes atarax and venlafaxine 150mg  which she takes for hot flashes.   She takes amitriptyline 10mg  at bedtime for sleep.  She is very sedated so only takes it on the weekend.    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 She endorses neck tension and feels that stretching helps.  She has done neck PT about 2 years ago.  MRI brain in 2021 was normal.   UPDATE  02/17/2023:  She is here for follow-up visit.  She continues to have headaches about 2-4 days per month, which is responsive to rizatriptan. However, she does report to taking rizatriptan every 2 hours until headache is resolved.  She was not clear on the instructed to limited to twice in 24 hours.  She feels that her headaches would be better, but she recently has been under a lot of stress - sister with stage 4 cancer and dog who passed last week.   UPDATE 06/24/2023:  She is here for follow-up visit.  She is taking topiramate 50mg  twice daily and reports significant benefit with Nurtec for acute migraine.  Headaches are better controlled and she has about 3-4 per month.  She reports having personal stressors.  Her sister passed in September and there have been family issues related to this.   Medications:  Current Outpatient Medications on File Prior to Visit  Medication Sig Dispense Refill   amitriptyline (ELAVIL) 10 MG tablet Take 1 tablet (10 mg total) by mouth at bedtime as needed for sleep. 24 tablet 1   aspirin EC 81 MG tablet Take 1 tablet (81 mg total) by mouth daily. 90 tablet 3   aspirin-acetaminophen-caffeine (EXCEDRIN MIGRAINE) 250-250-65 MG tablet Take 1-2 tablets by mouth every 6 (six) hours as needed for headache or migraine.     ezetimibe (ZETIA) 10 MG tablet Take 1 tablet (10 mg total) by mouth daily. 90 tablet 3   fluticasone (FLONASE) 50 MCG/ACT nasal spray SPRAY 2 SPRAYS INTO EACH NOSTRIL  EVERY DAY 16 mL 1   hydrochlorothiazide (HYDRODIURIL) 25 MG tablet TAKE 1 TABLET (25 MG TOTAL) BY MOUTH DAILY. 90 tablet 1   hydrOXYzine (ATARAX) 10 MG tablet Take 1-3 tablets (10-30 mg total) by mouth 2 (two) times daily as needed. 90 tablet 5   LORazepam (ATIVAN) 0.5 MG tablet Take 0.5-1 tablets (0.25-0.5 mg total) by mouth 2 (two) times daily as needed for anxiety. 60 tablet 5   nystatin-triamcinolone ointment (MYCOLOG) Apply 1 Application topically 2 (two) times daily. 30 g 0   Omega-3 1000  MG CAPS Take 2,000 mg by mouth.     ondansetron (ZOFRAN) 4 MG tablet Take 1-2 tablets (4-8 mg total) by mouth every 8 (eight) hours as needed for nausea or vomiting. 40 tablet 5   OVER THE COUNTER MEDICATION Allergy medication     Rimegepant Sulfate (NURTEC) 75 MG TBDP Take 1 tablet (75 mg total) by mouth as needed (for severe migraine). 8 tablet 3   rosuvastatin (CRESTOR) 10 MG tablet Take 10 mg by mouth daily.     venlafaxine XR (EFFEXOR-XR) 150 MG 24 hr capsule Take 1 capsule (150 mg total) by mouth daily with breakfast. 90 capsule 1   No current facility-administered medications on file prior to visit.    Allergies:  Allergies  Allergen Reactions   Chlorhexidine Other (See Comments)    Red and purple burns and peeled skin. Cannot use surgical glue.  Pt states Betadine is safe and without reaction.   Crestor [Rosuvastatin] Swelling    Swelling in her hands   Neomycin     UNSPECIFIED REACTION    Other Other (See Comments)    Body glue, turns skin blue   Sulfamethoxazole-Trimethoprim     UNSPECIFIED REACTION    Sulfasalazine Other (See Comments)   Sulfonamide Derivatives     UNSPECIFIED REACTION    Augmentin [Amoxicillin-Pot Clavulanate] Nausea And Vomiting    amox ok.    Hydrocodone Nausea And Vomiting    Severe vomiting   Oxycodone Nausea And Vomiting    Severe vomiting   Prednisone Other (See Comments)    insomnia    Vital Signs:  BP 110/72   Pulse 79   Ht 5\' 4"  (1.626 m)   Wt 243 lb (110.2 kg)   LMP 08/23/2017   SpO2 98%   BMI 41.71 kg/m    Neurological Exam: MENTAL STATUS including orientation to time, place, person, recent and remote memory, attention span and concentration, language, and fund of knowledge is normal.  Speech is not dysarthric.  CRANIAL NERVES:   Pupils equal round and reactive to light.  Normal conjugate, extra-ocular eye movements in all directions of gaze.  No ptosis .  Face is symmetric.   MOTOR:  Motor strength is 5/5 in all  extremities.  No atrophy, fasciculations or abnormal movements.  No pronator drift.  Tone is normal.    REFLEXES:  Reflexes 2+/4 throughout  SENSATION:  Vibration intact throughout  COORDINATION/GAIT:  Intact rapid alternating movements bilaterally.  Gait narrow based and stable.   Data: n/a     Thank you for allowing me to participate in patient's care.  If I can answer any additional questions, I would be pleased to do so.    Sincerely,    Attikus Bartoszek K. Allena Katz, DO

## 2023-06-25 LAB — VITAMIN B12: Vitamin B-12: 253 pg/mL (ref 211–911)

## 2023-06-25 LAB — TSH: TSH: 4.64 u[IU]/mL (ref 0.35–5.50)

## 2023-07-16 ENCOUNTER — Telehealth: Payer: Self-pay | Admitting: Medical Oncology

## 2023-07-16 NOTE — Telephone Encounter (Signed)
Does she need PET scan? I told her not at this time. Last dermatology ( Dr. Sebastian Ache ) appt was 02/24/2023.

## 2023-07-17 ENCOUNTER — Encounter: Payer: Self-pay | Admitting: Medical Oncology

## 2023-07-23 ENCOUNTER — Telehealth: Payer: Self-pay | Admitting: Internal Medicine

## 2023-07-23 NOTE — Telephone Encounter (Signed)
Left patient a message in regards to rescheduled appointment times/dates due to our facility closing early and provider is having a half a day

## 2023-07-28 NOTE — Patient Instructions (Addendum)
Return in about 24 weeks (around 01/13/2024) for cpe (20 min), Routine chronic condition follow-up.        Great to see you today.  I have refilled the medication(s) we provide.   If labs were collected or images ordered, we will inform you of  results once we have received them and reviewed. We will contact you either by echart message, or telephone call.  Please give ample time to the testing facility, and our office to run,  receive and review results. Please do not call inquiring of results, even if you can see them in your chart. We will contact you as soon as we are able. If it has been over 1 week since the test was completed, and you have not yet heard from [name], then please call us.    - echart message- for normal results that have been seen by the patient already.   - telephone call: abnormal results or if patient has not viewed results in their echart.  If a referral to a specialist was entered for you, please call us in 2 weeks if you have not heard from the specialist office to schedule.

## 2023-07-28 NOTE — Progress Notes (Signed)
Patient ID: Shannon Quinn, female  DOB: 1971/05/21, 51 y.o.   MRN: [id number] Patient Care Team    Relationship Specialty Notifications Start End  [name], DO PCP - General Family Medicine  11/03/17   Osborn Coho, MD (Inactive) Consulting Physician Otolaryngology  11/03/17   Glendale Chard, DO Consulting Physician Neurology  11/03/17   Beverely Low, MD Consulting Physician Orthopedic Surgery  06/01/19   Claud Kelp, MD Consulting Physician General Surgery  06/01/19   Hilarie Fredrickson, MD Consulting Physician Gastroenterology  05/23/21   Chilton Si, MD Attending Physician Cardiology  05/23/21     Chief Complaint  Patient presents with   Hypertension    Chronic Conditions/illness Management     Subjective:  Shannon Quinn is a 52 y.o.  Female  present for Chronic Conditions/illness Management All past medical history, surgical history, allergies, family history, immunizations, medications and social history were updated in the electronic medical record today. All recent labs, ED visits and hospitalizations within the last year were reviewed.  Anxiety and depression/Hot flashes due to menopause Pt reports she is feeling well on effexor 150 mg QD- no complaints.   Increased anxiety currently, her sister and pet recently passed away.  Ativan makes her to tired to take while working. She reports she took low dose klonopin in the past.    Hypertension/Fhx heart disease Pt reports compliance with HCTZ.  Patient unfortunately encountered myalgias in her hands with use of Crestor low-dose. Patient denies chest pain, shortness of breath, dizziness or lower extremity edema.   Pt does  take  daily baby ASA. Pt was prescribed statin. She and her family have started a mediterranean diet ant home. RF: HTN, obesity, FH heart disease and MI at 24 in father.   Migraine:   Patient reports her migraines have been well-controlled on current regimen.Migraine free since starting  amitriptyline 10 mg qhs on the weekends. . She did not taper up. She feels it helps her sleep, may cause a little drowsiness first in the morning. She only uses Topamax when going through headache flares-since it makes her foggy headed.      02/11/2023   11:24 AM 06/21/2022   10:50 AM 01/04/2022    3:05 PM 11/15/2021    9:07 AM 05/23/2021   10:04 AM  Depression screen PHQ 2/9  Decreased Interest 3 1 1 2 1   Down, Depressed, Hopeless 3 1 1 2    PHQ - 2 Score 6 2 2 4 1   Altered sleeping 3  2 3  0  Tired, decreased energy 3 2 3 2 2   Change in appetite 3 0 0 1 0  Feeling bad or failure about yourself  3 0 0 0 1  Trouble concentrating 3 3 1 2 1   Moving slowly or fidgety/restless 3 1 1 2 1   Suicidal thoughts  0 0 0 0  PHQ-9 Score 24  9 14 6   Difficult doing work/chores Extremely dIfficult          02/11/2023   11:25 AM 06/21/2022   10:50 AM 01/04/2022    3:06 PM 05/23/2021   10:05 AM  GAD 7 : Generalized Anxiety Score  Nervous, Anxious, on Edge 3 1 1 2   Control/stop worrying 3 1 1 2   Worry too much - different things 3 1 1 2   Trouble relaxing 2 1 0 2  Restless 2 1 0 2  Easily annoyed or irritable 3 1 1 1   Afraid - awful might happen  3 1 1  0  Total GAD 7 Score 19 7 5 11     Immunization History  Administered Date(s) Administered   Influenza,inj,Quad PF,6+ Mos 04/26/2013, 06/09/2016, 05/24/2019, 05/30/2020, 05/23/2021, 06/21/2022   Influenza,inj,quad, With Preservative 05/17/2019   PFIZER(Purple Top)SARS-COV-2 Vaccination 02/05/2020, 02/22/2020   Tdap 05/25/2012, 06/21/2022   Zoster Recombinant(Shingrix) 06/01/2019, 11/05/2019    Past Medical History:  Diagnosis Date   Abnormality of right breast on screening mammogram 10/09/2018   Allergy    Atypical chest pain 06/07/2019   Atypical migraine    Claudication in peripheral vascular disease (HCC) 07/10/2021   Colon polyps 2011   3 polpys removed   Complication of anesthesia    "severe Reaction" "Physically ill"    Deviated  septum    Endometriosis    Family history of adverse reaction to anesthesia    violent when waking up from anesthesia - very sick   Hepatic cyst    chronic- stable by yearly PET performed by onc   Hypertension    Hypoglycemic syndrome 1996   during pregnancy   Hypothyroidism    Impingement syndrome of right shoulder region 06/04/2018   Kidney stone    Lymphedema of right arm    Melanoma (HCC)    03/10/2018   PONV (postoperative nausea and vomiting)    Shoulder injury    right - side happened from last surgery   Thyroid disease    Varicose vein of leg    Allergies  Allergen Reactions   Chlorhexidine Other (See Comments)    Red and purple burns and peeled skin. Cannot use surgical glue.  Pt states Betadine is safe and without reaction.   Crestor [Rosuvastatin] Swelling    Swelling in her hands   Neomycin     UNSPECIFIED REACTION    Other Other (See Comments)    Body glue, turns skin blue   Sulfamethoxazole-Trimethoprim     UNSPECIFIED REACTION    Sulfasalazine Other (See Comments)   Sulfonamide Derivatives     UNSPECIFIED REACTION    Augmentin [Amoxicillin-Pot Clavulanate] Nausea And Vomiting    amox ok.    Hydrocodone Nausea And Vomiting    Severe vomiting   Oxycodone Nausea And Vomiting    Severe vomiting   Prednisone Other (See Comments)    insomnia   Past Surgical History:  Procedure Laterality Date   BREAST LUMPECTOMY WITH RADIOACTIVE SEED LOCALIZATION Right 10/09/2018   Procedure: RIGHT BREAST LUMPECTOMY WITH RADIOACTIVE SEED LOCALIZATION ERAS PATHWAY;  Surgeon: Claud Kelp, MD;  Location: [facility];  Service: General;  Laterality: Right;   COLONOSCOPY     polyps   ENDOVENOUS ABLATION SAPHENOUS VEIN W/ LASER Left 01/31/2022   endovenous laser ablation left greater saphenous vein and stab phlebectomy<10 incisions left leg by Coral Else MD   LAPAROSCOPIC TOTAL HYSTERECTOMY  09/04/2017   Dr. Henderson Cloud for endometriosis   MELANOMA EXCISION  WITH SENTINEL LYMPH NODE BIOPSY Left 04/02/2018   Procedure: INJECT BLUE DYE MELANOMA LEFT BACK WITH  WIDE LOCAL EXCISION MELANOMA LEFT BACK BILATERAL AXILLARY DEEP SENTINEL LYMPH NODE BIOPSY;  Surgeon: Claud Kelp, MD;  Location: [facility];  Service: General;  Laterality: Left;   TUBAL LIGATION     WISDOM TOOTH EXTRACTION     Family History  Problem Relation Age of Onset   Arthritis Mother    Dementia Mother 41   Stroke Mother    Osteoporosis Mother    Alcohol abuse Father    Arthritis Father  Hyperlipidemia Father    Stroke Father        2012   Depression Father    Diabetes Father    Heart disease Father 48   Heart failure Father    Heart attack Father    Diabetes Sister    Lung cancer Sister        stage 4, met to Brain   CAD Maternal Uncle    Colon cancer Maternal Uncle    Cancer Maternal Uncle    CAD Paternal Aunt    Congestive Heart Failure Paternal Aunt    Kidney failure Paternal Aunt    Diabetes Paternal Aunt    Cancer Paternal Uncle        breast cancer   Colon cancer Paternal Uncle    Breast cancer Maternal Grandmother 84   Dementia Maternal Grandmother    Heart disease Maternal Grandfather 27   CAD Maternal Grandfather    Diabetes Paternal Grandmother    COPD Paternal Grandfather        emphysema   Esophageal cancer Neg Hx    Stomach cancer Neg Hx    Rectal cancer Neg Hx    Social History   Social History Narrative   Regular exercise: walking 1 mile per day   Caffeine use: soda drinker   Lives with husband and 2 children in a one story home.     Works for [employer] [place].  She works in Animal nutritionist   Education: associates degree.      Allergies as of 07/29/2023       Reactions   Chlorhexidine Other (See Comments)   Red and purple burns and peeled skin. Cannot use surgical glue.  Pt states Betadine is safe and without reaction.   Crestor [rosuvastatin] Swelling   Swelling in her hands    Neomycin    UNSPECIFIED REACTION    Other Other (See Comments)   Body glue, turns skin blue   Sulfamethoxazole-trimethoprim    UNSPECIFIED REACTION    Sulfasalazine Other (See Comments)   Sulfonamide Derivatives    UNSPECIFIED REACTION    Augmentin [amoxicillin-pot Clavulanate] Nausea And Vomiting   amox ok.    Hydrocodone Nausea And Vomiting   Severe vomiting   Oxycodone Nausea And Vomiting   Severe vomiting   Prednisone Other (See Comments)   insomnia        Medication List        Accurate as of July 29, 2023  2:22 PM. If you have any questions, ask your nurse or doctor.          STOP taking these medications    LORazepam 0.5 MG tablet Commonly known as: ATIVAN Stopped by: Felix Pacini   nystatin-triamcinolone ointment Commonly known as: MYCOLOG Stopped by: Felix Pacini   OVER THE COUNTER MEDICATION Stopped by: Felix Pacini   rosuvastatin 10 MG tablet Commonly known as: CRESTOR Stopped by: Felix Pacini       TAKE these medications    amitriptyline 10 MG tablet Commonly known as: ELAVIL Take 1 tablet (10 mg total) by mouth at bedtime as needed for sleep.   aspirin EC 81 MG tablet Take 1 tablet (81 mg total) by mouth daily.   clonazePAM 0.5 MG tablet Commonly known as: KLONOPIN Take 1 tablet (0.5 mg total) by mouth 2 (two) times daily as needed for anxiety. Started by: Felix Pacini   Excedrin Migraine 250-250-65 MG tablet Generic drug: aspirin-acetaminophen-caffeine Take 1-2 tablets by mouth every 6 (  six) hours as needed for headache or migraine.   ezetimibe 10 MG tablet Commonly known as: ZETIA Take 1 tablet (10 mg total) by mouth daily.   fluticasone 50 MCG/ACT nasal spray Commonly known as: FLONASE SPRAY 2 SPRAYS INTO EACH NOSTRIL EVERY DAY   hydrochlorothiazide 25 MG tablet Commonly known as: HYDRODIURIL Take 1 tablet (25 mg total) by mouth daily.   hydrOXYzine 10 MG tablet Commonly known as: ATARAX Take 1-3 tablets (10-30 mg  total) by mouth 2 (two) times daily as needed.   Nurtec 75 MG Tbdp Generic drug: Rimegepant Sulfate Take 1 tablet (75 mg total) by mouth as needed (for severe migraine).   Omega-3 1000 MG Caps Take 2,000 mg by mouth.   ondansetron 4 MG tablet Commonly known as: ZOFRAN Take 1-2 tablets (4-8 mg total) by mouth every 8 (eight) hours as needed for nausea or vomiting.   topiramate 50 MG tablet Commonly known as: TOPAMAX Take 1 tablet (50 mg total) by mouth 2 (two) times daily.   venlafaxine XR 150 MG 24 hr capsule Commonly known as: EFFEXOR-XR Take 1 capsule (150 mg total) by mouth daily with breakfast.        All past medical history, surgical history, allergies, family history, immunizations andmedications were updated in the EMR today and reviewed under the history and medication portions of their EMR.      ROS: 14 pt review of systems performed and negative (unless mentioned in an HPI)  Objective: BP 138/88   Pulse 92   Temp 98 F (36.7 C)   Wt 249 lb 3.2 oz (113 kg)   LMP 08/23/2017   SpO2 96%   BMI 42.78 kg/m  Physical Exam Vitals and nursing note reviewed.  Constitutional:      General: She is not in acute distress.    Appearance: Normal appearance. She is not ill-appearing, toxic-appearing or diaphoretic.  HENT:     Head: Normocephalic and atraumatic.  Eyes:     General: No scleral icterus.       Right eye: No discharge.        Left eye: No discharge.     Extraocular Movements: Extraocular movements intact.     Conjunctiva/sclera: Conjunctivae normal.     Pupils: Pupils are equal, round, and reactive to light.  Cardiovascular:     Rate and Rhythm: Normal rate and regular rhythm.  Pulmonary:     Effort: Pulmonary effort is normal. No respiratory distress.     Breath sounds: Normal breath sounds. No wheezing, rhonchi or rales.  Musculoskeletal:     Right lower leg: No edema.     Left lower leg: No edema.  Skin:    General: Skin is warm.     Findings:  No rash.  Neurological:     Mental Status: She is alert and oriented to person, place, and time. Mental status is at baseline.     Motor: No weakness.     Gait: Gait normal.  Psychiatric:        Mood and Affect: Mood normal.        Behavior: Behavior normal.        Thought Content: Thought content normal.        Judgment: Judgment normal.     No results found.  Assessment/plan: Shannon Quinn is a 52 y.o. female present for chronic condition management Migraine: Stable  continue amitriptyline 10 mg (only on weekends-pt preference)  Continue Maxalt as needed Continue Vistaril as needed Continue Topamax -neurology  Continue Zofran 4 mg every 8 hours as needed   Anxiety and depression/Hot flashes due to menopause Stable Continue effexor 150 mg every day Dc- ativan, start klonopin 0.5 mg BID PRN  Hypertension/Fhx heart disease/Family history of heart disease/Coronary arteriosclerosis in native artery/Morbid obesity (HCC):  Stable- mildly elevated today, but stressed and grieving.  Continue hctz 25 Unable to tolerate Crestor. Taking zetia Labs up-to-date 01/30/2023  Melanoma of skin (HCC) Continue to follow with dermatology  Return in about 24 weeks (around 01/13/2024) for cpe (20 min), Routine chronic condition follow-up.  No orders of the defined types were placed in this encounter.   No orders of the defined types were placed in this encounter.  Meds ordered this encounter  Medications   venlafaxine XR (EFFEXOR-XR) 150 MG 24 hr capsule    Sig: Take 1 capsule (150 mg total) by mouth daily with breakfast.    Dispense:  90 capsule    Refill:  1   ondansetron (ZOFRAN) 4 MG tablet    Sig: Take 1-2 tablets (4-8 mg total) by mouth every 8 (eight) hours as needed for nausea or vomiting.    Dispense:  40 tablet    Refill:  5   hydrOXYzine (ATARAX) 10 MG tablet    Sig: Take 1-3 tablets (10-30 mg total) by mouth 2 (two) times daily as needed.    Dispense:  90 tablet     Refill:  5   hydrochlorothiazide (HYDRODIURIL) 25 MG tablet    Sig: Take 1 tablet (25 mg total) by mouth daily.    Dispense:  90 tablet    Refill:  1   ezetimibe (ZETIA) 10 MG tablet    Sig: Take 1 tablet (10 mg total) by mouth daily.    Dispense:  90 tablet    Refill:  3   amitriptyline (ELAVIL) 10 MG tablet    Sig: Take 1 tablet (10 mg total) by mouth at bedtime as needed for sleep.    Dispense:  90 tablet    Refill:  1   clonazePAM (KLONOPIN) 0.5 MG tablet    Sig: Take 1 tablet (0.5 mg total) by mouth 2 (two) times daily as needed for anxiety.    Dispense:  60 tablet    Refill:  5   Referral Orders  No referral(s) requested today     Electronically signed by: Felix Pacini, DO [facility] [place]

## 2023-07-29 ENCOUNTER — Ambulatory Visit: Payer: BC Managed Care – PPO | Admitting: Family Medicine

## 2023-07-29 ENCOUNTER — Encounter: Payer: Self-pay | Admitting: Family Medicine

## 2023-07-29 VITALS — BP 138/88 | HR 92 | Temp 98.0°F | Wt 249.2 lb

## 2023-07-29 DIAGNOSIS — F419 Anxiety disorder, unspecified: Secondary | ICD-10-CM

## 2023-07-29 DIAGNOSIS — F32A Depression, unspecified: Secondary | ICD-10-CM | POA: Diagnosis not present

## 2023-07-29 DIAGNOSIS — I10 Essential (primary) hypertension: Secondary | ICD-10-CM

## 2023-07-29 DIAGNOSIS — N951 Menopausal and female climacteric states: Secondary | ICD-10-CM

## 2023-07-29 MED ORDER — ONDANSETRON HCL 4 MG PO TABS: 4.0000 mg | ORAL_TABLET | Freq: Three times a day (TID) | ORAL | 5 refills | Status: DC | PRN

## 2023-07-29 MED ORDER — HYDROXYZINE HCL 10 MG PO TABS: 10.0000 mg | ORAL_TABLET | Freq: Two times a day (BID) | ORAL | 5 refills | Status: DC | PRN

## 2023-07-29 MED ORDER — CLONAZEPAM 0.5 MG PO TABS
0.5000 mg | ORAL_TABLET | Freq: Two times a day (BID) | ORAL | 5 refills | Status: AC | PRN
Start: 2023-07-29 — End: ?

## 2023-07-29 MED ORDER — AMITRIPTYLINE HCL 10 MG PO TABS: 10.0000 mg | ORAL_TABLET | Freq: Every evening | ORAL | 1 refills | Status: DC | PRN

## 2023-07-29 MED ORDER — VENLAFAXINE HCL ER 150 MG PO CP24: 150.0000 mg | ORAL_CAPSULE | Freq: Every day | ORAL | 1 refills | Status: DC

## 2023-07-29 MED ORDER — EZETIMIBE 10 MG PO TABS: 10.0000 mg | ORAL_TABLET | Freq: Every day | ORAL | 3 refills | Status: DC

## 2023-07-29 MED ORDER — HYDROCHLOROTHIAZIDE 25 MG PO TABS: 25.0000 mg | ORAL_TABLET | Freq: Every day | ORAL | 1 refills | Status: DC

## 2023-07-30 ENCOUNTER — Other Ambulatory Visit: Payer: Self-pay | Admitting: Neurology

## 2023-08-05 ENCOUNTER — Ambulatory Visit: Payer: BC Managed Care – PPO | Admitting: Internal Medicine

## 2023-08-05 ENCOUNTER — Other Ambulatory Visit: Payer: BC Managed Care – PPO

## 2023-08-05 ENCOUNTER — Inpatient Hospital Stay: Payer: BC Managed Care – PPO | Attending: Internal Medicine

## 2023-08-05 DIAGNOSIS — Z08 Encounter for follow-up examination after completed treatment for malignant neoplasm: Secondary | ICD-10-CM | POA: Insufficient documentation

## 2023-08-05 DIAGNOSIS — Z85820 Personal history of malignant melanoma of skin: Secondary | ICD-10-CM | POA: Insufficient documentation

## 2023-08-05 DIAGNOSIS — C439 Malignant melanoma of skin, unspecified: Secondary | ICD-10-CM

## 2023-08-05 LAB — CMP (CANCER CENTER ONLY)
ALT: 40 U/L (ref 0–44)
AST: 28 U/L (ref 15–41)
Albumin: 4.1 g/dL (ref 3.5–5.0)
Alkaline Phosphatase: 75 U/L (ref 38–126)
Anion gap: 9 (ref 5–15)
BUN: 15 mg/dL (ref 6–20)
CO2: 22 mmol/L (ref 22–32)
Calcium: 9.1 mg/dL (ref 8.9–10.3)
Chloride: 110 mmol/L (ref 98–111)
Creatinine: 0.96 mg/dL (ref 0.44–1.00)
GFR, Estimated: >60 mL/min (ref >60)
Glucose, Bld: 142 mg/dL — ABNORMAL HIGH (ref 70–99)
Potassium: 3.7 mmol/L (ref 3.5–5.1)
Sodium: 141 mmol/L (ref 135–145)
Total Bilirubin: 0.3 mg/dL (ref <1.2)
Total Protein: 7.5 g/dL (ref 6.5–8.1)

## 2023-08-05 LAB — CBC WITH DIFFERENTIAL (CANCER CENTER ONLY)
Abs Immature Granulocytes: 0.04 10*3/uL (ref 0.00–0.07)
Basophils Absolute: 0.1 10*3/uL (ref 0.0–0.1)
Basophils Relative: 1 %
Eosinophils Absolute: 0.4 10*3/uL (ref 0.0–0.5)
Eosinophils Relative: 5 %
HCT: 44.8 % (ref 36.0–46.0)
Hemoglobin: 14.7 g/dL (ref 12.0–15.0)
Immature Granulocytes: 0 %
Lymphocytes Relative: 37 %
Lymphs Abs: 3.4 10*3/uL (ref 0.7–4.0)
MCH: 29.9 pg (ref 26.0–34.0)
MCHC: 32.8 g/dL (ref 30.0–36.0)
MCV: 91.2 fL (ref 80.0–100.0)
Monocytes Absolute: 0.5 10*3/uL (ref 0.1–1.0)
Monocytes Relative: 6 %
Neutro Abs: 4.7 10*3/uL (ref 1.7–7.7)
Neutrophils Relative %: 51 %
Platelet Count: 288 10*3/uL (ref 150–400)
RBC: 4.91 MIL/uL (ref 3.87–5.11)
RDW: 14 % (ref 11.5–15.5)
WBC Count: 9.1 10*3/uL (ref 4.0–10.5)
nRBC: 0 % (ref 0.0–0.2)

## 2023-08-05 LAB — LACTATE DEHYDROGENASE: LDH: 172 U/L (ref 98–192)

## 2023-08-11 ENCOUNTER — Other Ambulatory Visit: Payer: BC Managed Care – PPO

## 2023-08-11 ENCOUNTER — Ambulatory Visit: Payer: BC Managed Care – PPO | Admitting: Internal Medicine

## 2023-08-29 ENCOUNTER — Encounter: Payer: Self-pay | Admitting: Urgent Care

## 2023-08-29 ENCOUNTER — Ambulatory Visit: Payer: BC Managed Care – PPO | Admitting: Urgent Care

## 2023-08-29 ENCOUNTER — Ambulatory Visit: Payer: Self-pay | Admitting: Family Medicine

## 2023-08-29 ENCOUNTER — Other Ambulatory Visit: Payer: Self-pay | Admitting: Urgent Care

## 2023-08-29 VITALS — BP 134/76 | HR 60 | Temp 98.2°F | Wt 248.8 lb

## 2023-08-29 DIAGNOSIS — R040 Epistaxis: Secondary | ICD-10-CM

## 2023-08-29 DIAGNOSIS — H6593 Unspecified nonsuppurative otitis media, bilateral: Secondary | ICD-10-CM

## 2023-08-29 DIAGNOSIS — J209 Acute bronchitis, unspecified: Secondary | ICD-10-CM

## 2023-08-29 DIAGNOSIS — J329 Chronic sinusitis, unspecified: Secondary | ICD-10-CM | POA: Diagnosis not present

## 2023-08-29 DIAGNOSIS — R0981 Nasal congestion: Secondary | ICD-10-CM

## 2023-08-29 DIAGNOSIS — B9689 Other specified bacterial agents as the cause of diseases classified elsewhere: Secondary | ICD-10-CM

## 2023-08-29 MED ORDER — MONTELUKAST SODIUM 10 MG PO TABS: 10.0000 mg | ORAL_TABLET | Freq: Every day | ORAL | 0 refills | Status: DC

## 2023-08-29 MED ORDER — DOXYCYCLINE HYCLATE 100 MG PO CAPS: 100.0000 mg | ORAL_CAPSULE | Freq: Two times a day (BID) | ORAL | 0 refills | Status: AC

## 2023-08-29 MED ORDER — PREDNISONE 10 MG PO TABS: 10.0000 mg | ORAL_TABLET | Freq: Every day | ORAL | 0 refills | Status: AC

## 2023-08-29 MED ORDER — ALBUTEROL SULFATE 108 (90 BASE) MCG/ACT IN AEPB: 2.0000 | INHALATION_SPRAY | Freq: Four times a day (QID) | RESPIRATORY_TRACT | 5 refills | Status: DC | PRN

## 2023-08-29 MED ORDER — AIRSUPRA 90-80 MCG/ACT IN AERO: 2.0000 | INHALATION_SPRAY | Freq: Four times a day (QID) | RESPIRATORY_TRACT | 11 refills | Status: DC | PRN

## 2023-08-29 NOTE — Telephone Encounter (Signed)
I called in [employer] instead which is covered under her insurance plan according to Epic. Thanks

## 2023-08-29 NOTE — Progress Notes (Signed)
Established Patient Office Visit  Subjective:  Patient ID: Shannon Quinn, female    DOB: February 18, 1971  Age: 53 y.o. MRN: [id number]  Chief Complaint  Patient presents with   Cough    Dry cough since christmas but last week her symptoms have increased. She has been having headaches, Soreness in her neck area and ear pain. She has also had some nose bleeds with green mucous.   Discussed the use of AI software for clinical note transcription with the patient who gave verbal consent to proceed.   53yo female with a history of allergies and sleep apnea, presents with a persistent cough that started around Christmas as a dry cough and has since evolved into a productive cough with post-nasal drainage. The cough is associated with a sensation of tightness and difficulty breathing, described as if the lungs can't get air. The patient also reports a runny nose and dry nose, with the sensation of the runny nose draining back into the throat. She has also experienced nosebleeds, which she attributes to the dryness of the nose.  In addition to the respiratory symptoms, the patient reports ear discomfort, described as a crinkling sensation and pain, suggesting fluid behind the ears. She also reports pain in the upper facial area, extending up to the eye sockets, which is unusual for her. Her eyes have been watery, but not in the typical manner associated with allergies.  The patient has been taking allergy medication, which includes a steroid nasal spray. She also has a history of using an inhaler for wheezing, which she reports can occur when she laughs too hard. She has not used the inhaler recently as it was emptied out some time ago and long expired.  The patient denies having any fevers, but notes that she experiences night sweats, which could potentially mask a fever if it were to break during the night. She has not taken any Tylenol or ibuprofen in the past 8-12 hours.  The patient's symptoms have been  ongoing for over three weeks and have been worsening. She has been in contact with her daughter, who was recently diagnosed with a respiratory infection but tested negative for COVID-19. The patient has also been in contact with her mother, who is elderly and has also been sick.   The patient does not smoke but was raised in a household of smokers. She has no documented history of pulmonary disease, but her mother and sister both have COPD. The patient is also prediabetic and has sleep apnea. She has been taking her migraine medication, which she notes can knock her out, making it difficult to pinpoint when certain symptoms started.   Cough    Patient Active Problem List   Diagnosis Date Noted   Grief 02/12/2023   Class 3 severe obesity due to excess calories with serious comorbidity and body mass index (BMI) of 40.0 to 44.9 in adult [facility]) 07/25/2022   Prediabetes 07/25/2022   Hyperlipidemia 07/25/2022   Claudication in peripheral vascular disease (HCC) 07/10/2021   Migraine without aura and without status migrainosus, not intractable 10/12/2020   Morbid obesity (HCC) 04/09/2020   Coronary arteriosclerosis in native artery 08/10/2019   Essential hypertension 06/01/2019   Family history of heart disease 06/01/2019   Hot flashes due to menopause 12/16/2018   Abnormality of right breast on screening mammogram 10/09/2018   Osteoarthritis of acromioclavicular joint 06/04/2018   Melanoma of skin (HCC) 03/26/2018   Eustachian tube dysfunction, bilateral 01/29/2017   Right cervical radiculopathy 11/08/2015  Anxiety and depression 03/26/2013   Varicose veins 12/01/2012   Hypothyroid 05/25/2012   Past Medical History:  Diagnosis Date   Abnormality of right breast on screening mammogram 10/09/2018   Allergy    Atypical chest pain 06/07/2019   Atypical migraine    Claudication in peripheral vascular disease (HCC) 07/10/2021   Colon polyps 2011   3 polpys removed   Complication of  anesthesia    "severe Reaction" "Physically ill"    Deviated septum    Endometriosis    Family history of adverse reaction to anesthesia    violent when waking up from anesthesia - very sick   Hepatic cyst    chronic- stable by yearly PET performed by onc   Hypertension    Hypoglycemic syndrome 1996   during pregnancy   Hypothyroidism    Impingement syndrome of right shoulder region 06/04/2018   Kidney stone    Lymphedema of right arm    Melanoma (HCC)    03/10/2018   PONV (postoperative nausea and vomiting)    Shoulder injury    right - side happened from last surgery   Thyroid disease    Varicose vein of leg    Past Surgical History:  Procedure Laterality Date   BREAST LUMPECTOMY WITH RADIOACTIVE SEED LOCALIZATION Right 10/09/2018   Procedure: RIGHT BREAST LUMPECTOMY WITH RADIOACTIVE SEED LOCALIZATION ERAS PATHWAY;  Surgeon: Claud Kelp, MD;  Location: [facility];  Service: General;  Laterality: Right;   COLONOSCOPY     polyps   ENDOVENOUS ABLATION SAPHENOUS VEIN W/ LASER Left 01/31/2022   endovenous laser ablation left greater saphenous vein and stab phlebectomy<10 incisions left leg by Coral Else MD   LAPAROSCOPIC TOTAL HYSTERECTOMY  09/04/2017   Dr. Henderson Cloud for endometriosis   MELANOMA EXCISION WITH SENTINEL LYMPH NODE BIOPSY Left 04/02/2018   Procedure: INJECT BLUE DYE MELANOMA LEFT BACK WITH  WIDE LOCAL EXCISION MELANOMA LEFT BACK BILATERAL AXILLARY DEEP SENTINEL LYMPH NODE BIOPSY;  Surgeon: Claud Kelp, MD;  Location: [facility];  Service: General;  Laterality: Left;   TUBAL LIGATION     WISDOM TOOTH EXTRACTION     Social History   Tobacco Use   Smoking status: Never    Passive exposure: Past   Smokeless tobacco: Never  Vaping Use   Vaping status: Never Used  Substance Use Topics   Alcohol use: Yes    Alcohol/week: 0.0 standard drinks of alcohol    Comment: rarely   Drug use: No      ROS: as noted in  HPI  Objective:     BP 134/76   Pulse 60   Temp 98.2 F (36.8 C) (Oral)   Wt 248 lb 12.8 oz (112.9 kg)   LMP 08/23/2017   SpO2 98%   BMI 42.71 kg/m  BP Readings from Last 3 Encounters:  08/29/23 134/76  07/29/23 138/88  06/24/23 110/72   Wt Readings from Last 3 Encounters:  08/29/23 248 lb 12.8 oz (112.9 kg)  07/29/23 249 lb 3.2 oz (113 kg)  06/24/23 243 lb (110.2 kg)      Physical Exam Vitals and nursing note reviewed.  Constitutional:      General: She is not in acute distress.    Appearance: Normal appearance. She is obese. She is not ill-appearing, toxic-appearing or diaphoretic.  HENT:     Head: Normocephalic and atraumatic.     Right Ear: No drainage, swelling or tenderness. A middle ear effusion is present. No mastoid tenderness. Tympanic  membrane is injected. Tympanic membrane is not scarred, perforated, erythematous or bulging.     Left Ear: No drainage, swelling or tenderness. A middle ear effusion is present. No mastoid tenderness. Tympanic membrane is injected. Tympanic membrane is not scarred, perforated, erythematous or bulging.     Nose: Congestion and rhinorrhea present.     Right Nostril: Epistaxis (evidence of recent bleed) present.     Left Nostril: Epistaxis (evidence of recent bleed) present.     Right Turbinates: Swollen.     Left Turbinates: Swollen.     Right Sinus: Maxillary sinus tenderness present. No frontal sinus tenderness.     Left Sinus: Maxillary sinus tenderness present. No frontal sinus tenderness.     Mouth/Throat:     Lips: Pink.     Pharynx: Oropharynx is clear. Uvula midline. No pharyngeal swelling, oropharyngeal exudate, posterior oropharyngeal erythema, uvula swelling or postnasal drip.  Cardiovascular:     Rate and Rhythm: Normal rate and regular rhythm.  Pulmonary:     Effort: Pulmonary effort is normal. No respiratory distress.     Breath sounds: No stridor. Wheezing present. No rhonchi or rales.  Chest:     Chest wall: No  tenderness.  Musculoskeletal:     Cervical back: Normal range of motion and neck supple. No rigidity or tenderness.  Lymphadenopathy:     Cervical: No cervical adenopathy.  Skin:    General: Skin is warm and dry.     Coloration: Skin is not jaundiced.     Findings: No bruising, erythema or rash.  Neurological:     General: No focal deficit present.     Mental Status: She is alert and oriented to person, place, and time.  Psychiatric:        Mood and Affect: Mood normal.        Behavior: Behavior normal.      No results found for any visits on 08/29/23.  Last CBC Lab Results  Component Value Date   WBC 9.1 08/05/2023   HGB 14.7 08/05/2023   HCT 44.8 08/05/2023   MCV 91.2 08/05/2023   MCH 29.9 08/05/2023   RDW 14.0 08/05/2023   PLT 288 08/05/2023   Last metabolic panel Lab Results  Component Value Date   GLUCOSE 142 (H) 08/05/2023   NA 141 08/05/2023   K 3.7 08/05/2023   CL 110 08/05/2023   CO2 22 08/05/2023   BUN 15 08/05/2023   CREATININE 0.96 08/05/2023   GFRNONAA >60 08/05/2023   CALCIUM 9.1 08/05/2023   PROT 7.5 08/05/2023   ALBUMIN 4.1 08/05/2023   LABGLOB 2.6 12/07/2021   AGRATIO 1.6 12/07/2021   BILITOT 0.3 08/05/2023   ALKPHOS 75 08/05/2023   AST 28 08/05/2023   ALT 40 08/05/2023   ANIONGAP 9 08/05/2023      The 10-year ASCVD risk score (Arnett DK, et al., 2019) is: 2.5%  Assessment & Plan:  Bacterial sinusitis -     Doxycycline Hyclate; Take 1 capsule (100 mg total) by mouth 2 (two) times daily for 10 days.  Dispense: 20 capsule; Refill: 0 -     predniSONE; Take 1 tablet (10 mg total) by mouth daily with breakfast for 5 days.  Dispense: 5 tablet; Refill: 0  Acute bronchitis, unspecified organism -     predniSONE; Take 1 tablet (10 mg total) by mouth daily with breakfast for 5 days.  Dispense: 5 tablet; Refill: 0 -     Montelukast Sodium; Take 1 tablet (10 mg total) by mouth  at bedtime.  Dispense: 14 tablet; Refill: 0 -     Albuterol Sulfate;  Inhale 2 puffs into the lungs every 6 (six) hours as needed.  Dispense: 1 each; Refill: 5  Otitis media with effusion, bilateral -     predniSONE; Take 1 tablet (10 mg total) by mouth daily with breakfast for 5 days.  Dispense: 5 tablet; Refill: 0  Nasal congestion  Epistaxis   Assessment and Plan Upper Respiratory Infection Persistent cough since Christmas, initially dry, now productive with post-nasal drainage. Associated with chest tightness and wheezing. No fever reported. Family history of COPD, but no personal history of chronic lung disorders. -Start Doxycycline twice daily until finished. -Start Prednisone 10mg  once daily with breakfast for 5 days. -Start Montelukast once nightly for 10 days. -Refill Albuterol inhaler, use 2 puffs every 4-6 hours as needed.  Nasal Dryness and Epistaxis Reports dry nose with occasional nosebleeds. Currently using Flonase. -Discontinue Flonase and any other prescription nasal sprays. -Start using over-the-counter saline nasal sprays. -Purchase and apply nasal gel (Simply Saline, Nuvigil, or Rhinaris) for dry noses.  Otitis Media with Effusion Reports ear discomfort and "crinkling" sound, fluid found behind eardrums. -Management included in the treatment plan for upper respiratory infection.  Follow-up Contact the office if there are any issues with prescription coverage at the pharmacy of the prescribed albuterol. If cough persists after completing the course of medications, further evaluation may be needed.  No follow-ups on file.   Maretta Bees, PA

## 2023-08-29 NOTE — Telephone Encounter (Signed)
Copied from CRM [id number]. Topic: Clinical - Red Word Triage >> Aug 29, 2023  8:03 AM Steele Sizer wrote: Red Word that prompted transfer to Nurse Triage: Pt stated that her daughter was sick and seen on Monday and believes she may have it to. She has a cough, headache, shortness of breath, runny nose, vomiting, blurry vision, swelling symptoms have been going on since last Thursday.   Chief Complaint: Cold like symptoms Symptoms: Dry cough, headache, nausea, sore throat, ears feel like they have fluid in them, congestion Frequency: Since last week Pertinent Negatives: Patient denies fever, coughing up, difficulty breathing Disposition: [] ED /[] Urgent Care (no appt availability in office) / [x] Appointment(In office/virtual)/ []  [place] [facility]/ [] Home Care/ [] Refused Recommended Disposition /[] [place] [facility]/ []  Follow-up with PCP Additional Notes: Patient called and advised that she has been sick, missed two days of work.  Her daughter was sick and she isn't sure if she got this from her daughter or if it is sinus related due to the weather change.  Patient states that she does have migraines anyway and had some nausea at times.  She states that her daughter ended up being diagnosed with an upper respiratory infection.  Patient denies any fevers or coughing up blood.  She denies any difficulty breathing. Appointment is made for today 08/29/2023 at 10am wit [employer].  Patient is also advised that if anything worsens to go to the emergency room.  She verbalized understanding.  Reason for Disposition  [1] Continuous (nonstop) coughing interferes with work or school AND [2] no improvement using cough treatment per Care Advice  Answer Assessment - Initial Assessment Questions 1. ONSET: "When did the cough begin?"      Off and on since last week when cold weather front came through 2. SEVERITY: "How bad is the cough today?"      Patient had a coughing fit on the phone  3. SPUTUM:  "Describe the color of your sputum" (none, dry cough; clear, white, yellow, green)     Green 4. HEMOPTYSIS: "Are you coughing up any blood?" If so ask: "How much?" (flecks, streaks, tablespoons, etc.)     No 5. DIFFICULTY BREATHING: "Are you having difficulty breathing?" If Yes, ask: "How bad is it?" (e.g., mild, moderate, severe)    - MILD: No SOB at rest, mild SOB with walking, speaks normally in sentences, can lie down, no retractions, pulse < 100.    - MODERATE: SOB at rest, SOB with minimal exertion and prefers to sit, cannot lie down flat, speaks in phrases, mild retractions, audible wheezing, pulse 100-120.    - SEVERE: Very SOB at rest, speaks in single words, struggling to breathe, sitting hunched forward, retractions, pulse > 120      When coughing  6. FEVER: "Do you have a fever?" If Yes, ask: "What is your temperature, how was it measured, and when did it start?"     Fever 7. CARDIAC HISTORY: "Do you have any history of heart disease?" (e.g., heart attack, congestive heart failure)      No 8. LUNG HISTORY: "Do you have any history of lung disease?"  (e.g., pulmonary embolus, asthma, emphysema)     No 9. PE RISK FACTORS: "Do you have a history of blood clots?" (or: recent major surgery, recent prolonged travel, bedridden)     No 10. OTHER SYMPTOMS: "Do you have any other symptoms?" (e.g., runny nose, wheezing, chest pain)       Runny nose,  12. TRAVEL: "  Have you traveled out of the country in the last month?" (e.g., travel history, exposures)       No  Protocols used: Cough - Acute Non-Productive-A-AH

## 2023-08-29 NOTE — Progress Notes (Signed)
The albuterol called in earlier is not covered by insurance. It appears that only albuterol via neb is covered. However, Paulene Floor appears to be covered under insurance therefore will switch to this as her rescue inhaler. Rx sent to pharmacy.

## 2023-08-29 NOTE — Patient Instructions (Signed)
Continue with saline nasal sprays, but stop flonase or any other Rx nasal sprays. Purchase nasogel, simply saline gel or rhinaris gel (any gel that says "for dry noses") this will help with the nose bleeds  Take doxycycline twice daily until gone. Best without food, however if this causes nausea, take with a non-dairy containing meal.  Take prednisone once daily with breakfast.  Take montelukast once nightly; this can make you feel sleepy  Use the albuterol inhaler 2 puffs ever 4-6 hours AS NEEDED - this will help with wheezing or tightness in the chest.

## 2023-08-29 NOTE — Telephone Encounter (Signed)
Pt advised.

## 2023-08-29 NOTE — Telephone Encounter (Signed)
Pt schedule for same day appt

## 2023-08-29 NOTE — Telephone Encounter (Signed)
Copied from CRM [id number]. Topic: Clinical - Medication Question >> Aug 29, 2023 11:32 AM Lennart Pall wrote: Reason for CRM: Albuterol Sulfate (PROAIR RESPICLICK) 108 (90 Base) MCG/ACT AEPB [[id number]]  The pharmacy is not able to fill this due to it saying (PROAIR RESPICLICK). Insurance will not cover it. Can this be changed so Patient can fill medication

## 2023-09-09 ENCOUNTER — Encounter: Payer: Self-pay | Admitting: Family Medicine

## 2023-09-09 ENCOUNTER — Ambulatory Visit: Payer: Self-pay | Admitting: Family Medicine

## 2023-09-09 ENCOUNTER — Ambulatory Visit: Payer: BC Managed Care – PPO | Admitting: Family Medicine

## 2023-09-09 VITALS — BP 110/70 | HR 87 | Temp 98.5°F | Wt 246.6 lb

## 2023-09-09 DIAGNOSIS — R0981 Nasal congestion: Secondary | ICD-10-CM

## 2023-09-09 DIAGNOSIS — J329 Chronic sinusitis, unspecified: Secondary | ICD-10-CM

## 2023-09-09 DIAGNOSIS — B9689 Other specified bacterial agents as the cause of diseases classified elsewhere: Secondary | ICD-10-CM

## 2023-09-09 MED ORDER — CEFDINIR 300 MG PO CAPS: 300.0000 mg | ORAL_CAPSULE | Freq: Two times a day (BID) | ORAL | 0 refills | Status: DC

## 2023-09-09 NOTE — Telephone Encounter (Signed)
1st attempt- attempted to call patient for triage. Received VMB, LVM to return call. Will continue to attempt.     Copied from CRM [id number]. Topic: Clinical - Medical Advice >> Sep 08, 2023 11:08 AM Steele Sizer wrote: Reason for CRM: Pt stated that she has finsihed all her antibiotics. But she still has the cough, and wanted to know can she have another round of antibiotics because she doesn't feel like the cold is fully gone.

## 2023-09-09 NOTE — Progress Notes (Signed)
Shannon Quinn , 07/15/1971, 53 y.o., female MRN: [id number] Patient Care Team    Relationship Specialty Notifications Start End  Shannon Leatherwood, DO PCP - General Family Medicine  11/03/17   Osborn Coho, MD (Inactive) Consulting Physician Otolaryngology  11/03/17   Glendale Chard, DO Consulting Physician Neurology  11/03/17   Beverely Low, MD Consulting Physician Orthopedic Surgery  06/01/19   Claud Kelp, MD Consulting Physician General Surgery  06/01/19   Hilarie Fredrickson, MD Consulting Physician Gastroenterology  05/23/21   Chilton Si, MD Attending Physician Cardiology  05/23/21     Chief Complaint  Patient presents with   Nasal Congestion   Cough    Complete 10 day course abx and has not improved     Subjective: Shannon Quinn is a 53 y.o. Pt presents for an OV with complaints of continued congestion and cough of > 3 weeks duration.  Associated symptoms pt was treated with doxy bid and prednisone burst, which she completed 2 days ago. She reports her symptoms definitely improved, but the majority symptoms have remained.     02/11/2023   11:24 AM 06/21/2022   10:50 AM 01/04/2022    3:05 PM 11/15/2021    9:07 AM 05/23/2021   10:04 AM  Depression screen PHQ 2/9  Decreased Interest 3 1 1 2 1   Down, Depressed, Hopeless 3 1 1 2    PHQ - 2 Score 6 2 2 4 1   Altered sleeping 3  2 3  0  Tired, decreased energy 3 2 3 2 2   Change in appetite 3 0 0 1 0  Feeling bad or failure about yourself  3 0 0 0 1  Trouble concentrating 3 3 1 2 1   Moving slowly or fidgety/restless 3 1 1 2 1   Suicidal thoughts  0 0 0 0  PHQ-9 Score 24  9 14 6   Difficult doing work/chores Extremely dIfficult        Allergies  Allergen Reactions   Chlorhexidine Other (See Comments)    Red and purple burns and peeled skin. Cannot use surgical glue.  Pt states Betadine is safe and without reaction.   Crestor [Rosuvastatin] Swelling    Swelling in her hands   Neomycin     UNSPECIFIED REACTION     Other Other (See Comments)    Body glue, turns skin blue   Sulfamethoxazole-Trimethoprim     UNSPECIFIED REACTION    Sulfasalazine Other (See Comments)   Sulfonamide Derivatives     UNSPECIFIED REACTION    Augmentin [Amoxicillin-Pot Clavulanate] Nausea And Vomiting    amox ok.    Hydrocodone Nausea And Vomiting    Severe vomiting   Oxycodone Nausea And Vomiting    Severe vomiting   Prednisone Other (See Comments)    insomnia   Social History   Social History Narrative   Regular exercise: walking 1 mile per day   Caffeine use: soda drinker   Lives with husband and 2 children in a one story home.     Works for [employer] [place].  She works in Animal nutritionist   Education: associates degree.     Past Medical History:  Diagnosis Date   Abnormality of right breast on screening mammogram 10/09/2018   Allergy    Atypical chest pain 06/07/2019   Atypical migraine    Claudication in peripheral vascular disease (HCC) 07/10/2021   Colon polyps 2011   3 polpys removed   Complication of  anesthesia    "severe Reaction" "Physically ill"    Deviated septum    Endometriosis    Family history of adverse reaction to anesthesia    violent when waking up from anesthesia - very sick   Hepatic cyst    chronic- stable by yearly PET performed by onc   Hypertension    Hypoglycemic syndrome 1996   during pregnancy   Hypothyroidism    Impingement syndrome of right shoulder region 06/04/2018   Kidney stone    Lymphedema of right arm    Melanoma (HCC)    03/10/2018   PONV (postoperative nausea and vomiting)    Shoulder injury    right - side happened from last surgery   Thyroid disease    Varicose vein of leg    Past Surgical History:  Procedure Laterality Date   BREAST LUMPECTOMY WITH RADIOACTIVE SEED LOCALIZATION Right 10/09/2018   Procedure: RIGHT BREAST LUMPECTOMY WITH RADIOACTIVE SEED LOCALIZATION ERAS PATHWAY;  Surgeon: Claud Kelp, MD;  Location: [facility];  Service: General;  Laterality: Right;   COLONOSCOPY     polyps   ENDOVENOUS ABLATION SAPHENOUS VEIN W/ LASER Left 01/31/2022   endovenous laser ablation left greater saphenous vein and stab phlebectomy<10 incisions left leg by Coral Else MD   LAPAROSCOPIC TOTAL HYSTERECTOMY  09/04/2017   Dr. Henderson Cloud for endometriosis   MELANOMA EXCISION WITH SENTINEL LYMPH NODE BIOPSY Left 04/02/2018   Procedure: INJECT BLUE DYE MELANOMA LEFT BACK WITH  WIDE LOCAL EXCISION MELANOMA LEFT BACK BILATERAL AXILLARY DEEP SENTINEL LYMPH NODE BIOPSY;  Surgeon: Claud Kelp, MD;  Location: [facility];  Service: General;  Laterality: Left;   TUBAL LIGATION     WISDOM TOOTH EXTRACTION     Family History  Problem Relation Age of Onset   Arthritis Mother    Dementia Mother 65   Stroke Mother    Osteoporosis Mother    Alcohol abuse Father    Arthritis Father    Hyperlipidemia Father    Stroke Father        2012   Depression Father    Diabetes Father    Heart disease Father 12   Heart failure Father    Heart attack Father    Diabetes Sister    Lung cancer Sister        stage 4, met to Brain   CAD Maternal Uncle    Colon cancer Maternal Uncle    Cancer Maternal Uncle    CAD Paternal Aunt    Congestive Heart Failure Paternal Aunt    Kidney failure Paternal Aunt    Diabetes Paternal Aunt    Cancer Paternal Uncle        breast cancer   Colon cancer Paternal Uncle    Breast cancer Maternal Grandmother 33   Dementia Maternal Grandmother    Heart disease Maternal Grandfather 60   CAD Maternal Grandfather    Diabetes Paternal Grandmother    COPD Paternal Grandfather        emphysema   Esophageal cancer Neg Hx    Stomach cancer Neg Hx    Rectal cancer Neg Hx    Allergies as of 09/09/2023       Reactions   Chlorhexidine Other (See Comments)   Red and purple burns and peeled skin. Cannot use surgical glue.  Pt states Betadine is safe and without reaction.    Crestor [rosuvastatin] Swelling   Swelling in her hands   Neomycin    UNSPECIFIED REACTION  Other Other (See Comments)   Body glue, turns skin blue   Sulfamethoxazole-trimethoprim    UNSPECIFIED REACTION    Sulfasalazine Other (See Comments)   Sulfonamide Derivatives    UNSPECIFIED REACTION    Augmentin [amoxicillin-pot Clavulanate] Nausea And Vomiting   amox ok.    Hydrocodone Nausea And Vomiting   Severe vomiting   Oxycodone Nausea And Vomiting   Severe vomiting   Prednisone Other (See Comments)   insomnia        Medication List        Accurate as of September 09, 2023  1:47 PM. If you have any questions, ask your nurse or doctor.          Airsupra 90-80 MCG/ACT Aero Generic drug: Albuterol-Budesonide Inhale 2 puffs into the lungs every 6 (six) hours as needed (Do not exceed 12 inhalations in 24 hours).   Albuterol Sulfate 108 (90 Base) MCG/ACT Aepb Commonly known as: PROAIR RESPICLICK Inhale 2 puffs into the lungs every 6 (six) hours as needed.   amitriptyline 10 MG tablet Commonly known as: ELAVIL Take 1 tablet (10 mg total) by mouth at bedtime as needed for sleep.   aspirin EC 81 MG tablet Take 1 tablet (81 mg total) by mouth daily.   cefdinir 300 MG capsule Commonly known as: OMNICEF Take 1 capsule (300 mg total) by mouth 2 (two) times daily. Started by: Felix Pacini   clonazePAM 0.5 MG tablet Commonly known as: KLONOPIN Take 1 tablet (0.5 mg total) by mouth 2 (two) times daily as needed for anxiety.   Excedrin Migraine 250-250-65 MG tablet Generic drug: aspirin-acetaminophen-caffeine Take 1-2 tablets by mouth every 6 (six) hours as needed for headache or migraine.   ezetimibe 10 MG tablet Commonly known as: ZETIA Take 1 tablet (10 mg total) by mouth daily.   fluticasone 50 MCG/ACT nasal spray Commonly known as: FLONASE SPRAY 2 SPRAYS INTO EACH NOSTRIL EVERY DAY   hydrochlorothiazide 25 MG tablet Commonly known as: HYDRODIURIL Take 1 tablet  (25 mg total) by mouth daily.   hydrOXYzine 10 MG tablet Commonly known as: ATARAX Take 1-3 tablets (10-30 mg total) by mouth 2 (two) times daily as needed.   montelukast 10 MG tablet Commonly known as: SINGULAIR Take 1 tablet (10 mg total) by mouth at bedtime.   Nurtec 75 MG Tbdp Generic drug: Rimegepant Sulfate Take 1 tablet (75 mg total) by mouth as needed (for severe migraine).   Omega-3 1000 MG Caps Take 2,000 mg by mouth.   ondansetron 4 MG tablet Commonly known as: ZOFRAN Take 1-2 tablets (4-8 mg total) by mouth every 8 (eight) hours as needed for nausea or vomiting.   topiramate 50 MG tablet Commonly known as: TOPAMAX Take 1 tablet (50 mg total) by mouth 2 (two) times daily.   venlafaxine XR 150 MG 24 hr capsule Commonly known as: EFFEXOR-XR Take 1 capsule (150 mg total) by mouth daily with breakfast.        All past medical history, surgical history, allergies, family history, immunizations andmedications were updated in the EMR today and reviewed under the history and medication portions of their EMR.     Review of Systems  Constitutional:  Positive for malaise/fatigue. Negative for chills and fever.  HENT:  Positive for congestion and sinus pain.   Respiratory:  Positive for cough and sputum production.   Gastrointestinal:  Negative for diarrhea, nausea and vomiting.  Musculoskeletal:  Negative for myalgias.  Skin:  Negative for rash.  Neurological:  Positive  for headaches. Negative for dizziness.   Negative, with the exception of above mentioned in HPI   Objective:  BP 110/70   Pulse 87   Temp 98.5 F (36.9 C)   Wt 246 lb 9.6 oz (111.9 kg)   LMP 08/23/2017   SpO2 96%   BMI 42.33 kg/m  Body mass index is 42.33 kg/m. Physical Exam Vitals and nursing note reviewed.  Constitutional:      General: She is not in acute distress.    Appearance: Normal appearance. She is not ill-appearing, toxic-appearing or diaphoretic.  HENT:     Head:  Normocephalic and atraumatic.     Right Ear: Hearing, tympanic membrane, ear canal and external ear normal.     Left Ear: Hearing, tympanic membrane, ear canal and external ear normal.     Nose: Mucosal edema, congestion and rhinorrhea present. Rhinorrhea is clear.     Right Turbinates: Swollen.     Left Turbinates: Swollen.     Right Sinus: Maxillary sinus tenderness and frontal sinus tenderness present.     Left Sinus: Maxillary sinus tenderness and frontal sinus tenderness present.  Eyes:     General: No scleral icterus.       Right eye: No discharge.        Left eye: No discharge.     Extraocular Movements: Extraocular movements intact.     Conjunctiva/sclera: Conjunctivae normal.     Pupils: Pupils are equal, round, and reactive to light.  Cardiovascular:     Rate and Rhythm: Normal rate and regular rhythm.  Pulmonary:     Effort: Pulmonary effort is normal. No respiratory distress.     Breath sounds: Normal breath sounds. No wheezing, rhonchi or rales.  Musculoskeletal:     Cervical back: Neck supple.     Right lower leg: No edema.     Left lower leg: No edema.  Lymphadenopathy:     Cervical: Cervical adenopathy present.  Skin:    General: Skin is warm.     Findings: No rash.  Neurological:     Mental Status: She is alert and oriented to person, place, and time. Mental status is at baseline.     Motor: No weakness.     Gait: Gait normal.  Psychiatric:        Mood and Affect: Mood normal.        Behavior: Behavior normal.        Thought Content: Thought content normal.        Judgment: Judgment normal.     No results found. No results found. No results found for this or any previous visit (from the past 24 hours).  Assessment/Plan: Jecenia Leamer is a 53 y.o. female present for OV for  Bacterial sinusitis: Rest, hydrate.  Start flonase, mucinex (DM if cough), nettie pot or nasal saline.  omnicef prescribed, take until completed.  If cough present it can last up  to 6-8 weeks.  F/U 2 weeks if not improved.    Reviewed expectations re: course of current medical issues. Discussed self-management of symptoms. Outlined signs and symptoms indicating need for more acute intervention. Patient verbalized understanding and all questions were answered. Patient received an After-Visit Summary.    No orders of the defined types were placed in this encounter.  Meds ordered this encounter  Medications   cefdinir (OMNICEF) 300 MG capsule    Sig: Take 1 capsule (300 mg total) by mouth 2 (two) times daily.    Dispense:  20 capsule  Refill:  0   Referral Orders  No referral(s) requested today     Note is dictated utilizing voice recognition software. Although note has been proof read prior to signing, occasional typographical errors still can be missed. If any questions arise, please do not hesitate to call for verification.   electronically signed by:  Felix Pacini, DO  [facility] [place]

## 2023-09-09 NOTE — Patient Instructions (Signed)

## 2023-09-09 NOTE — Telephone Encounter (Signed)
Spoke with patient regarding results/recommendations.

## 2023-09-09 NOTE — Telephone Encounter (Signed)
  Chief Complaint: cold like symptoms Symptoms: congestion, cough, green tinged sputum. Frequency: started 2 weeks ago. Pertinent Negatives: Patient denies fever, sob Disposition: [] ED /[] Urgent Care (no appt availability in office) / [] Appointment(In office/virtual)/ []  [facility]/ [x] Home Care/ [] Refused Recommended Disposition /[] [facility]/ [x]  Follow-up with PCP Additional Notes: Patient states symptoms of cough and green sputum are not going away.  States inhaler is working well and denies sob.  Would like another antibiotic.  Office to call patient.  Care advice given, denies questions.  Instructed to go to the er if becomes worse.   Reason for Disposition  Cough  Answer Assessment - Initial Assessment Questions 1. ONSET: "When did the cough begin?"      Was on an antibiotic and finished therapy, still having symptoms.  Started 2 weeks ago 2. SEVERITY: "How bad is the cough today?"      bad 3. SPUTUM: "Describe the color of your sputum" (none, dry cough; clear, white, yellow, green)     A lot of congestion and mucous has broken down, clear with green undertones.  4. HEMOPTYSIS: "Are you coughing up any blood?" If so ask: "How much?" (flecks, streaks, tablespoons, etc.)     Denies, does have dry nose with nosebleeds 5. DIFFICULTY BREATHING: "Are you having difficulty breathing?" If Yes, ask: "How bad is it?" (e.g., mild, moderate, severe)    - MILD: No SOB at rest, mild SOB with walking, speaks normally in sentences, can lie down, no retractions, pulse < 100.    - MODERATE: SOB at rest, SOB with minimal exertion and prefers to sit, cannot lie down flat, speaks in phrases, mild retractions, audible wheezing, pulse 100-120.    - SEVERE: Very SOB at rest, speaks in single words, struggling to breathe, sitting hunched forward, retractions, pulse > 120      Denies, states inhaler is working.  6. FEVER: "Do you have a fever?" If Yes, ask: "What is your temperature,  how was it measured, and when did it start?"     denies 7. CARDIAC HISTORY: "Do you have any history of heart disease?" (e.g., heart attack, congestive heart failure)      Yes, family heart history 8. LUNG HISTORY: "Do you have any history of lung disease?"  (e.g., pulmonary embolus, asthma, emphysema)     Raised with "smokers" 9. PE RISK FACTORS: "Do you have a history of blood clots?" (or: recent major surgery, recent prolonged travel, bedridden)     denies 10. OTHER SYMPTOMS: "Do you have any other symptoms?" (e.g., runny nose, wheezing, chest pain)       congestion  Protocols used: Cough - Acute Productive-A-AH

## 2023-09-15 ENCOUNTER — Ambulatory Visit (HOSPITAL_BASED_OUTPATIENT_CLINIC_OR_DEPARTMENT_OTHER)
Admission: RE | Admit: 2023-09-15 | Discharge: 2023-09-15 | Disposition: A | Discharge status: Home / Self Care | Payer: BC Managed Care – PPO | Source: Ambulatory Visit | Attending: Family Medicine | Admitting: Family Medicine

## 2023-09-15 ENCOUNTER — Ambulatory Visit: Payer: BC Managed Care – PPO | Admitting: Family Medicine

## 2023-09-15 ENCOUNTER — Encounter: Payer: Self-pay | Admitting: Family Medicine

## 2023-09-15 VITALS — BP 120/78 | HR 105 | Temp 98.3°F

## 2023-09-15 DIAGNOSIS — R053 Chronic cough: Secondary | ICD-10-CM | POA: Diagnosis not present

## 2023-09-15 DIAGNOSIS — R059 Cough, unspecified: Secondary | ICD-10-CM | POA: Diagnosis not present

## 2023-09-15 MED ORDER — AZITHROMYCIN 250 MG PO TABS: ORAL_TABLET | ORAL | 0 refills | Status: AC

## 2023-09-15 MED ORDER — BENZONATATE 200 MG PO CAPS: 200.0000 mg | ORAL_CAPSULE | Freq: Two times a day (BID) | ORAL | 0 refills | Status: DC | PRN

## 2023-09-15 MED ORDER — PREDNISONE 20 MG PO TABS: ORAL_TABLET | ORAL | 0 refills | Status: DC

## 2023-09-15 MED ORDER — IPRATROPIUM BROMIDE 0.06 % NA SOLN: 2.0000 | Freq: Four times a day (QID) | NASAL | 12 refills | Status: DC

## 2023-09-15 NOTE — Patient Instructions (Addendum)
Return in about 2 weeks (around 09/29/2023), or if symptoms worsen or fail to improve.        Great to see you today.  I have refilled the medication(s) we provide.   If labs were collected or images ordered, we will inform you of  results once we have received them and reviewed. We will contact you either by echart message, or telephone call.  Please give ample time to the testing facility, and our office to run,  receive and review results. Please do not call inquiring of results, even if you can see them in your chart. We will contact you as soon as we are able. If it has been over 1 week since the test was completed, and you have not yet heard from [name], then please call us.    - echart message- for normal results that have been seen by the patient already.   - telephone call: abnormal results or if patient has not viewed results in their echart.  If a referral to a specialist was entered for you, please call us in 2 weeks if you have not heard from the specialist office to schedule.

## 2023-09-15 NOTE — Progress Notes (Signed)
Jeoffrey Massed , [birth year], 54 y.o., female MRN: [id number] Patient Care Team    Relationship Specialty Notifications Start End  [name], DO PCP - General Family Medicine  11/03/17   Osborn Coho, MD (Inactive) Consulting Physician Otolaryngology  11/03/17   Glendale Chard, DO Consulting Physician Neurology  11/03/17   Beverely Low, MD Consulting Physician Orthopedic Surgery  06/01/19   Claud Kelp, MD Consulting Physician General Surgery  06/01/19   Hilarie Fredrickson, MD Consulting Physician Gastroenterology  05/23/21   Chilton Si, MD Attending Physician Cardiology  05/23/21     Chief Complaint  Patient presents with   Cough     Subjective: Shannon Quinn is a 53 y.o. Pt presents for an OV with complaints of  Pt reports fever and chills started over the weekend. Cough worsened. Deep barking cough, increased phlegm production. Low grade fever today per pt. Denies nausea or vomit, but no appetite.  Prior note: continued congestion and cough of > 3 weeks duration.  Associated symptoms pt was treated with doxy bid and prednisone burst, which she completed 2 days ago. She reports her symptoms definitely improved, but the majority symptoms have remained.     02/11/2023   11:24 AM 06/21/2022   10:50 AM 01/04/2022    3:05 PM 11/15/2021    9:07 AM 05/23/2021   10:04 AM  Depression screen PHQ 2/9  Decreased Interest 3 1 1 2 1   Down, Depressed, Hopeless 3 1 1 2    PHQ - 2 Score 6 2 2 4 1   Altered sleeping 3  2 3  0  Tired, decreased energy 3 2 3 2 2   Change in appetite 3 0 0 1 0  Feeling bad or failure about yourself  3 0 0 0 1  Trouble concentrating 3 3 1 2 1   Moving slowly or fidgety/restless 3 1 1 2 1   Suicidal thoughts  0 0 0 0  PHQ-9 Score 24  9 14 6   Difficult doing work/chores Extremely dIfficult        Allergies  Allergen Reactions   Chlorhexidine Other (See Comments)    Red and purple burns and peeled skin. Cannot use surgical glue.  Pt states Betadine  is safe and without reaction.   Crestor [Rosuvastatin] Swelling    Swelling in her hands   Neomycin     UNSPECIFIED REACTION    Other Other (See Comments)    Body glue, turns skin blue   Sulfamethoxazole-Trimethoprim     UNSPECIFIED REACTION    Sulfasalazine Other (See Comments)   Sulfonamide Derivatives     UNSPECIFIED REACTION    Augmentin [Amoxicillin-Pot Clavulanate] Nausea And Vomiting    amox ok.    Hydrocodone Nausea And Vomiting    Severe vomiting   Oxycodone Nausea And Vomiting    Severe vomiting   Prednisone Other (See Comments)    insomnia   Social History   Social History Narrative   Regular exercise: walking 1 mile per day   Caffeine use: soda drinker   Lives with husband and 2 children in a one story home.     Works for [employer] [place].  She works in Animal nutritionist   Education: associates degree.     Past Medical History:  Diagnosis Date   Abnormality of right breast on screening mammogram 10/09/2018   Allergy    Atypical chest pain 06/07/2019   Atypical migraine    Claudication in peripheral  vascular disease (HCC) 07/10/2021   Colon polyps 2011   3 polpys removed   Complication of anesthesia    "severe Reaction" "Physically ill"    Deviated septum    Endometriosis    Family history of adverse reaction to anesthesia    violent when waking up from anesthesia - very sick   Hepatic cyst    chronic- stable by yearly PET performed by onc   Hypertension    Hypoglycemic syndrome 1996   during pregnancy   Hypothyroidism    Impingement syndrome of right shoulder region 06/04/2018   Kidney stone    Lymphedema of right arm    Melanoma (HCC)    03/10/2018   PONV (postoperative nausea and vomiting)    Shoulder injury    right - side happened from last surgery   Thyroid disease    Varicose vein of leg    Past Surgical History:  Procedure Laterality Date   BREAST LUMPECTOMY WITH RADIOACTIVE SEED LOCALIZATION Right 10/09/2018    Procedure: RIGHT BREAST LUMPECTOMY WITH RADIOACTIVE SEED LOCALIZATION ERAS PATHWAY;  Surgeon: Claud Kelp, MD;  Location: [facility];  Service: General;  Laterality: Right;   COLONOSCOPY     polyps   ENDOVENOUS ABLATION SAPHENOUS VEIN W/ LASER Left 01/31/2022   endovenous laser ablation left greater saphenous vein and stab phlebectomy<10 incisions left leg by Coral Else MD   LAPAROSCOPIC TOTAL HYSTERECTOMY  09/04/2017   Dr. Henderson Cloud for endometriosis   MELANOMA EXCISION WITH SENTINEL LYMPH NODE BIOPSY Left 04/02/2018   Procedure: INJECT BLUE DYE MELANOMA LEFT BACK WITH  WIDE LOCAL EXCISION MELANOMA LEFT BACK BILATERAL AXILLARY DEEP SENTINEL LYMPH NODE BIOPSY;  Surgeon: Claud Kelp, MD;  Location: [facility];  Service: General;  Laterality: Left;   TUBAL LIGATION     WISDOM TOOTH EXTRACTION     Family History  Problem Relation Age of Onset   Arthritis Mother    Dementia Mother 85   Stroke Mother    Osteoporosis Mother    Alcohol abuse Father    Arthritis Father    Hyperlipidemia Father    Stroke Father        2012   Depression Father    Diabetes Father    Heart disease Father 63   Heart failure Father    Heart attack Father    Diabetes Sister    Lung cancer Sister        stage 4, met to Brain   CAD Maternal Uncle    Colon cancer Maternal Uncle    Cancer Maternal Uncle    CAD Paternal Aunt    Congestive Heart Failure Paternal Aunt    Kidney failure Paternal Aunt    Diabetes Paternal Aunt    Cancer Paternal Uncle        breast cancer   Colon cancer Paternal Uncle    Breast cancer Maternal Grandmother 40   Dementia Maternal Grandmother    Heart disease Maternal Grandfather 60   CAD Maternal Grandfather    Diabetes Paternal Grandmother    COPD Paternal Grandfather        emphysema   Esophageal cancer Neg Hx    Stomach cancer Neg Hx    Rectal cancer Neg Hx    Allergies as of 09/15/2023       Reactions   Chlorhexidine Other (See  Comments)   Red and purple burns and peeled skin. Cannot use surgical glue.  Pt states Betadine is safe and without reaction.  Crestor [rosuvastatin] Swelling   Swelling in her hands   Neomycin    UNSPECIFIED REACTION    Other Other (See Comments)   Body glue, turns skin blue   Sulfamethoxazole-trimethoprim    UNSPECIFIED REACTION    Sulfasalazine Other (See Comments)   Sulfonamide Derivatives    UNSPECIFIED REACTION    Augmentin [amoxicillin-pot Clavulanate] Nausea And Vomiting   amox ok.    Hydrocodone Nausea And Vomiting   Severe vomiting   Oxycodone Nausea And Vomiting   Severe vomiting   Prednisone Other (See Comments)   insomnia        Medication List        Accurate as of September 15, 2023  2:50 PM. If you have any questions, ask your nurse or doctor.          Airsupra 90-80 MCG/ACT Aero Generic drug: Albuterol-Budesonide Inhale 2 puffs into the lungs every 6 (six) hours as needed (Do not exceed 12 inhalations in 24 hours).   Albuterol Sulfate 108 (90 Base) MCG/ACT Aepb Commonly known as: PROAIR RESPICLICK Inhale 2 puffs into the lungs every 6 (six) hours as needed.   amitriptyline 10 MG tablet Commonly known as: ELAVIL Take 1 tablet (10 mg total) by mouth at bedtime as needed for sleep.   aspirin EC 81 MG tablet Take 1 tablet (81 mg total) by mouth daily.   azithromycin 250 MG tablet Commonly known as: ZITHROMAX Take 2 tablets on day 1, then 1 tablet daily on days 2 through 5 Started by: Felix Pacini   benzonatate 200 MG capsule Commonly known as: TESSALON Take 1 capsule (200 mg total) by mouth 2 (two) times daily as needed for cough. Started by: Felix Pacini   cefdinir 300 MG capsule Commonly known as: OMNICEF Take 1 capsule (300 mg total) by mouth 2 (two) times daily.   clonazePAM 0.5 MG tablet Commonly known as: KLONOPIN Take 1 tablet (0.5 mg total) by mouth 2 (two) times daily as needed for anxiety.   Excedrin Migraine 250-250-65 MG  tablet Generic drug: aspirin-acetaminophen-caffeine Take 1-2 tablets by mouth every 6 (six) hours as needed for headache or migraine.   ezetimibe 10 MG tablet Commonly known as: ZETIA Take 1 tablet (10 mg total) by mouth daily.   fluticasone 50 MCG/ACT nasal spray Commonly known as: FLONASE SPRAY 2 SPRAYS INTO EACH NOSTRIL EVERY DAY   hydrochlorothiazide 25 MG tablet Commonly known as: HYDRODIURIL Take 1 tablet (25 mg total) by mouth daily.   hydrOXYzine 10 MG tablet Commonly known as: ATARAX Take 1-3 tablets (10-30 mg total) by mouth 2 (two) times daily as needed.   ipratropium 0.06 % nasal spray Commonly known as: ATROVENT Place 2 sprays into both nostrils 4 (four) times daily. Started by: Felix Pacini   montelukast 10 MG tablet Commonly known as: SINGULAIR Take 1 tablet (10 mg total) by mouth at bedtime.   Nurtec 75 MG Tbdp Generic drug: Rimegepant Sulfate Take 1 tablet (75 mg total) by mouth as needed (for severe migraine).   Omega-3 1000 MG Caps Take 2,000 mg by mouth.   ondansetron 4 MG tablet Commonly known as: ZOFRAN Take 1-2 tablets (4-8 mg total) by mouth every 8 (eight) hours as needed for nausea or vomiting.   predniSONE 20 MG tablet Commonly known as: DELTASONE 60 mg x1d, 40 mg x4d, 20 mg x3d, 10 mg x2d Started by: Felix Pacini   topiramate 50 MG tablet Commonly known as: TOPAMAX Take 1 tablet (50 mg total) by mouth  2 (two) times daily.   venlafaxine XR 150 MG 24 hr capsule Commonly known as: EFFEXOR-XR Take 1 capsule (150 mg total) by mouth daily with breakfast.        All past medical history, surgical history, allergies, family history, immunizations andmedications were updated in the EMR today and reviewed under the history and medication portions of their EMR.     Review of Systems  Constitutional:  Positive for malaise/fatigue. Negative for chills and fever.  HENT:  Positive for congestion and sinus pain.   Respiratory:  Positive for  cough and sputum production.   Gastrointestinal:  Negative for diarrhea, nausea and vomiting.  Musculoskeletal:  Negative for myalgias.  Skin:  Negative for rash.  Neurological:  Positive for headaches. Negative for dizziness.   Negative, with the exception of above mentioned in HPI   Objective:  BP 120/78   Pulse (!) 105   Temp 98.3 F (36.8 C)   LMP 08/23/2017   SpO2 98%  There is no height or weight on file to calculate BMI. Physical Exam Vitals and nursing note reviewed.  Constitutional:      General: She is not in acute distress.    Appearance: Normal appearance. She is not ill-appearing, toxic-appearing or diaphoretic.  HENT:     Head: Normocephalic and atraumatic.     Right Ear: Hearing, tympanic membrane, ear canal and external ear normal.     Left Ear: Hearing, tympanic membrane, ear canal and external ear normal.     Nose: Mucosal edema, congestion and rhinorrhea present. Rhinorrhea is clear.     Right Turbinates: Swollen.     Left Turbinates: Swollen.     Right Sinus: Maxillary sinus tenderness and frontal sinus tenderness present.     Left Sinus: Maxillary sinus tenderness and frontal sinus tenderness present.  Eyes:     General: No scleral icterus.       Right eye: No discharge.        Left eye: No discharge.     Extraocular Movements: Extraocular movements intact.     Conjunctiva/sclera: Conjunctivae normal.     Pupils: Pupils are equal, round, and reactive to light.  Cardiovascular:     Rate and Rhythm: Normal rate and regular rhythm.  Pulmonary:     Effort: Pulmonary effort is normal. No respiratory distress.     Breath sounds: Rhonchi present. No wheezing or rales.     Comments: Deep cough.  Musculoskeletal:     Cervical back: Neck supple.     Right lower leg: No edema.     Left lower leg: No edema.  Lymphadenopathy:     Cervical: Cervical adenopathy present.  Skin:    General: Skin is warm.     Findings: No rash.  Neurological:     Mental  Status: She is alert and oriented to person, place, and time. Mental status is at baseline.     Motor: No weakness.     Gait: Gait normal.  Psychiatric:        Mood and Affect: Mood normal.        Behavior: Behavior normal.        Thought Content: Thought content normal.        Judgment: Judgment normal.     No results found. No results found. No results found for this or any previous visit (from the past 24 hours).  Assessment/Plan: Amandamarie Feggins is a 53 y.o. female present for OV for  Bacterial resp infection Rest, hydrate.  Continue omnicef BID Continue flonase, mucinex (DM if cough), nettie pot or nasal saline.  Start zpack Tessalon perles  If cough present it can last up to 6-8 weeks.  Cxr ordered F/U 2 weeks if not improved.    Reviewed expectations re: course of current medical issues. Discussed self-management of symptoms. Outlined signs and symptoms indicating need for more acute intervention. Patient verbalized understanding and all questions were answered. Patient received an After-Visit Summary.    Orders Placed This Encounter  Procedures   DG Chest 2 View   Meds ordered this encounter  Medications   azithromycin (ZITHROMAX) 250 MG tablet    Sig: Take 2 tablets on day 1, then 1 tablet daily on days 2 through 5    Dispense:  6 tablet    Refill:  0   ipratropium (ATROVENT) 0.06 % nasal spray    Sig: Place 2 sprays into both nostrils 4 (four) times daily.    Dispense:  15 mL    Refill:  12   predniSONE (DELTASONE) 20 MG tablet    Sig: 60 mg x1d, 40 mg x4d, 20 mg x3d, 10 mg x2d    Dispense:  15 tablet    Refill:  0   benzonatate (TESSALON) 200 MG capsule    Sig: Take 1 capsule (200 mg total) by mouth 2 (two) times daily as needed for cough.    Dispense:  20 capsule    Refill:  0   Referral Orders  No referral(s) requested today     Note is dictated utilizing voice recognition software. Although note has been proof read prior to signing,  occasional typographical errors still can be missed. If any questions arise, please do not hesitate to call for verification.   electronically signed by:  Felix Pacini, DO  [facility] [place]

## 2023-09-16 ENCOUNTER — Encounter: Payer: Self-pay | Admitting: Family Medicine

## 2023-09-18 ENCOUNTER — Telehealth: Payer: Self-pay

## 2023-09-18 NOTE — Telephone Encounter (Signed)
 Here are days that need work note.   2/3, 1/28 and 1/17   Thank you.

## 2023-09-18 NOTE — Telephone Encounter (Signed)
 I couldn't copy and paste CRM  Patient needs work excuse for 3 days she has missed this year.  Please call patient [phone]

## 2023-09-19 NOTE — Telephone Encounter (Signed)
 Okay to write a letter, we saw her for illness all 3 of those days.   In the future, would encourage her to let us  know at the office visit she needs a work excuse.

## 2023-09-19 NOTE — Telephone Encounter (Signed)
Letter sent to pt through MyChart.

## 2023-09-22 ENCOUNTER — Telehealth: Payer: Self-pay | Admitting: Family Medicine

## 2023-09-22 NOTE — Telephone Encounter (Signed)
 Pt scheduled for 2-11

## 2023-09-22 NOTE — Telephone Encounter (Signed)
 Copied from CRM [id number]. Topic: Clinical - Medication Question >> Sep 22, 2023 11:45 AM Ovid Blow wrote: Reason for CRM: Patient stated that she passed out this past weekend, she feels dizzy and dealing with bronchitis and sinisitis. Patient stated that she feels like it could possibly lead to pneumonia. Patient is requesting more medicine (antibiotics). Patient is working from home and will possibly need a note from Dr.

## 2023-09-23 ENCOUNTER — Encounter: Payer: Self-pay | Admitting: Family Medicine

## 2023-09-23 ENCOUNTER — Ambulatory Visit: Payer: BC Managed Care – PPO | Admitting: Family Medicine

## 2023-09-23 VITALS — BP 128/74 | HR 76 | Temp 97.4°F | Wt 246.0 lb

## 2023-09-23 DIAGNOSIS — R55 Syncope and collapse: Secondary | ICD-10-CM

## 2023-09-23 DIAGNOSIS — J209 Acute bronchitis, unspecified: Secondary | ICD-10-CM

## 2023-09-23 DIAGNOSIS — B9689 Other specified bacterial agents as the cause of diseases classified elsewhere: Secondary | ICD-10-CM | POA: Diagnosis not present

## 2023-09-23 DIAGNOSIS — J329 Chronic sinusitis, unspecified: Secondary | ICD-10-CM

## 2023-09-23 LAB — BASIC METABOLIC PANEL
BUN: 19 mg/dL (ref 6–23)
CO2: 31 meq/L (ref 19–32)
Calcium: 9.8 mg/dL (ref 8.4–10.5)
Chloride: 97 meq/L (ref 96–112)
Creatinine, Ser: 1.23 mg/dL — ABNORMAL HIGH (ref 0.40–1.20)
GFR: 49.81 mL/min — ABNORMAL LOW (ref >60.00)
Glucose, Bld: 147 mg/dL — ABNORMAL HIGH (ref 70–99)
Potassium: 3.3 meq/L — ABNORMAL LOW (ref 3.5–5.1)
Sodium: 141 meq/L (ref 135–145)

## 2023-09-23 LAB — CBC WITH DIFFERENTIAL/PLATELET
Basophils Absolute: 0.1 10*3/uL (ref 0.0–0.1)
Basophils Relative: 0.6 % (ref 0.0–3.0)
Eosinophils Absolute: 0.1 10*3/uL (ref 0.0–0.7)
Eosinophils Relative: 0.6 % (ref 0.0–5.0)
HCT: 48.4 % — ABNORMAL HIGH (ref 36.0–46.0)
Hemoglobin: 15.7 g/dL — ABNORMAL HIGH (ref 12.0–15.0)
Lymphocytes Relative: 38.5 % (ref 12.0–46.0)
Lymphs Abs: 4.9 10*3/uL — ABNORMAL HIGH (ref 0.7–4.0)
MCHC: 32.4 g/dL (ref 30.0–36.0)
MCV: 91 fL (ref 78.0–100.0)
Monocytes Absolute: 0.5 10*3/uL (ref 0.1–1.0)
Monocytes Relative: 4.2 % (ref 3.0–12.0)
Neutro Abs: 7.2 10*3/uL (ref 1.4–7.7)
Neutrophils Relative %: 56.1 % (ref 43.0–77.0)
Platelets: 431 10*3/uL — ABNORMAL HIGH (ref 150.0–400.0)
RBC: 5.32 Mil/uL — ABNORMAL HIGH (ref 3.87–5.11)
RDW: 14.7 % (ref 11.5–15.5)
WBC: 12.8 10*3/uL — ABNORMAL HIGH (ref 4.0–10.5)

## 2023-09-23 NOTE — Progress Notes (Signed)
Shannon Quinn , 12-26-1970, 53 y.o., female MRN: [id number] Patient Care Team    Relationship Specialty Notifications Start End  [name], DO PCP - General Family Medicine  11/03/17   Osborn Coho, MD (Inactive) Consulting Physician Otolaryngology  11/03/17   Glendale Chard, DO Consulting Physician Neurology  11/03/17   Beverely Low, MD Consulting Physician Orthopedic Surgery  06/01/19   Claud Kelp, MD Consulting Physician General Surgery  06/01/19   Hilarie Fredrickson, MD Consulting Physician Gastroenterology  05/23/21   Chilton Si, MD Attending Physician Cardiology  05/23/21     Chief Complaint  Patient presents with   Cough    Cough, slight congestion, green phlegm, fatigue.  Pt mentions an onset of dizziness that comes and goes; experienced episodes of syncope over the weekend.      Subjective: Shannon Quinn is a 53 y.o. Pt presents for an OV with complaints of concern for cough/bronchitis. She reports presyncope over the weekend while in the shower- she was in a seated position. . She has been struggling with significant bronchitis over the 4 weeks.Treated initially with doxy bid and low dose prednisone by another provider, then with omnicef, pred 10 day taper and z-pack. Cxr 09/15/2023 was "no active cardiopulmonary disease." Since last seen she reports no fever, chills ,voice is returning, cough is improving, congestion better. Dizziness has worsened.  Prior note: Pt reports fever and chills started over the weekend. Cough worsened. Deep barking cough, increased phlegm production. Low grade fever today per pt. Denies nausea or vomit, but no appetite.  Prior note: continued congestion and cough of > 3 weeks duration.  Associated symptoms pt was treated with doxy bid and prednisone burst, which she completed 2 days ago. She reports her symptoms definitely improved, but the majority symptoms have remained.     02/11/2023   11:24 AM 06/21/2022   10:50 AM 01/04/2022     3:05 PM 11/15/2021    9:07 AM 05/23/2021   10:04 AM  Depression screen PHQ 2/9  Decreased Interest 3 1 1 2 1   Down, Depressed, Hopeless 3 1 1 2    PHQ - 2 Score 6 2 2 4 1   Altered sleeping 3  2 3  0  Tired, decreased energy 3 2 3 2 2   Change in appetite 3 0 0 1 0  Feeling bad or failure about yourself  3 0 0 0 1  Trouble concentrating 3 3 1 2 1   Moving slowly or fidgety/restless 3 1 1 2 1   Suicidal thoughts  0 0 0 0  PHQ-9 Score 24  9 14 6   Difficult doing work/chores Extremely dIfficult        Allergies  Allergen Reactions   Chlorhexidine Other (See Comments)    Red and purple burns and peeled skin. Cannot use surgical glue.  Pt states Betadine is safe and without reaction.   Crestor [Rosuvastatin] Swelling    Swelling in her hands   Neomycin     UNSPECIFIED REACTION    Other Other (See Comments)    Body glue, turns skin blue   Sulfamethoxazole-Trimethoprim     UNSPECIFIED REACTION    Sulfasalazine Other (See Comments)   Sulfonamide Derivatives     UNSPECIFIED REACTION    Augmentin [Amoxicillin-Pot Clavulanate] Nausea And Vomiting    amox ok.    Hydrocodone Nausea And Vomiting    Severe vomiting   Oxycodone Nausea And Vomiting    Severe vomiting   Prednisone Other (See  Comments)    insomnia   Social History   Social History Narrative   Regular exercise: walking 1 mile per day   Caffeine use: soda drinker   Lives with husband and 2 children in a one story home.     Works for [employer] [place].  She works in Animal nutritionist   Education: associates degree.     Past Medical History:  Diagnosis Date   Abnormality of right breast on screening mammogram 10/09/2018   Allergy    Atypical chest pain 06/07/2019   Atypical migraine    Claudication in peripheral vascular disease (HCC) 07/10/2021   Colon polyps 2011   3 polpys removed   Complication of anesthesia    "severe Reaction" "Physically ill"    Deviated septum    Endometriosis     Family history of adverse reaction to anesthesia    violent when waking up from anesthesia - very sick   Hepatic cyst    chronic- stable by yearly PET performed by onc   Hypertension    Hypoglycemic syndrome 1996   during pregnancy   Hypothyroidism    Impingement syndrome of right shoulder region 06/04/2018   Kidney stone    Lymphedema of right arm    Melanoma (HCC)    03/10/2018   PONV (postoperative nausea and vomiting)    Shoulder injury    right - side happened from last surgery   Thyroid disease    Varicose vein of leg    Past Surgical History:  Procedure Laterality Date   BREAST LUMPECTOMY WITH RADIOACTIVE SEED LOCALIZATION Right 10/09/2018   Procedure: RIGHT BREAST LUMPECTOMY WITH RADIOACTIVE SEED LOCALIZATION ERAS PATHWAY;  Surgeon: Claud Kelp, MD;  Location: [facility];  Service: General;  Laterality: Right;   COLONOSCOPY     polyps   ENDOVENOUS ABLATION SAPHENOUS VEIN W/ LASER Left 01/31/2022   endovenous laser ablation left greater saphenous vein and stab phlebectomy<10 incisions left leg by Coral Else MD   LAPAROSCOPIC TOTAL HYSTERECTOMY  09/04/2017   Dr. Henderson Cloud for endometriosis   MELANOMA EXCISION WITH SENTINEL LYMPH NODE BIOPSY Left 04/02/2018   Procedure: INJECT BLUE DYE MELANOMA LEFT BACK WITH  WIDE LOCAL EXCISION MELANOMA LEFT BACK BILATERAL AXILLARY DEEP SENTINEL LYMPH NODE BIOPSY;  Surgeon: Claud Kelp, MD;  Location: [facility];  Service: General;  Laterality: Left;   TUBAL LIGATION     WISDOM TOOTH EXTRACTION     Family History  Problem Relation Age of Onset   Arthritis Mother    Dementia Mother 63   Stroke Mother    Osteoporosis Mother    Alcohol abuse Father    Arthritis Father    Hyperlipidemia Father    Stroke Father        2012   Depression Father    Diabetes Father    Heart disease Father 79   Heart failure Father    Heart attack Father    Diabetes Sister    Lung cancer Sister        stage 4, met  to Brain   CAD Maternal Uncle    Colon cancer Maternal Uncle    Cancer Maternal Uncle    CAD Paternal Aunt    Congestive Heart Failure Paternal Aunt    Kidney failure Paternal Aunt    Diabetes Paternal Aunt    Cancer Paternal Uncle        breast cancer   Colon cancer Paternal Uncle  Breast cancer Maternal Grandmother 60   Dementia Maternal Grandmother    Heart disease Maternal Grandfather 67   CAD Maternal Grandfather    Diabetes Paternal Grandmother    COPD Paternal Grandfather        emphysema   Esophageal cancer Neg Hx    Stomach cancer Neg Hx    Rectal cancer Neg Hx    Allergies as of 09/23/2023       Reactions   Chlorhexidine Other (See Comments)   Red and purple burns and peeled skin. Cannot use surgical glue.  Pt states Betadine is safe and without reaction.   Crestor [rosuvastatin] Swelling   Swelling in her hands   Neomycin    UNSPECIFIED REACTION    Other Other (See Comments)   Body glue, turns skin blue   Sulfamethoxazole-trimethoprim    UNSPECIFIED REACTION    Sulfasalazine Other (See Comments)   Sulfonamide Derivatives    UNSPECIFIED REACTION    Augmentin [amoxicillin-pot Clavulanate] Nausea And Vomiting   amox ok.    Hydrocodone Nausea And Vomiting   Severe vomiting   Oxycodone Nausea And Vomiting   Severe vomiting   Prednisone Other (See Comments)   insomnia        Medication List        Accurate as of September 23, 2023  9:36 AM. If you have any questions, ask your nurse or doctor.          Airsupra 90-80 MCG/ACT Aero Generic drug: Albuterol-Budesonide Inhale 2 puffs into the lungs every 6 (six) hours as needed (Do not exceed 12 inhalations in 24 hours).   Albuterol Sulfate 108 (90 Base) MCG/ACT Aepb Commonly known as: PROAIR RESPICLICK Inhale 2 puffs into the lungs every 6 (six) hours as needed.   amitriptyline 10 MG tablet Commonly known as: ELAVIL Take 1 tablet (10 mg total) by mouth at bedtime as needed for sleep.   aspirin  EC 81 MG tablet Take 1 tablet (81 mg total) by mouth daily.   benzonatate 200 MG capsule Commonly known as: TESSALON Take 1 capsule (200 mg total) by mouth 2 (two) times daily as needed for cough.   cefdinir 300 MG capsule Commonly known as: OMNICEF Take 1 capsule (300 mg total) by mouth 2 (two) times daily.   clonazePAM 0.5 MG tablet Commonly known as: KLONOPIN Take 1 tablet (0.5 mg total) by mouth 2 (two) times daily as needed for anxiety.   Excedrin Migraine 250-250-65 MG tablet Generic drug: aspirin-acetaminophen-caffeine Take 1-2 tablets by mouth every 6 (six) hours as needed for headache or migraine.   ezetimibe 10 MG tablet Commonly known as: ZETIA Take 1 tablet (10 mg total) by mouth daily.   fluticasone 50 MCG/ACT nasal spray Commonly known as: FLONASE SPRAY 2 SPRAYS INTO EACH NOSTRIL EVERY DAY   hydrochlorothiazide 25 MG tablet Commonly known as: HYDRODIURIL Take 1 tablet (25 mg total) by mouth daily.   hydrOXYzine 10 MG tablet Commonly known as: ATARAX Take 1-3 tablets (10-30 mg total) by mouth 2 (two) times daily as needed.   ipratropium 0.06 % nasal spray Commonly known as: ATROVENT Place 2 sprays into both nostrils 4 (four) times daily.   montelukast 10 MG tablet Commonly known as: SINGULAIR Take 1 tablet (10 mg total) by mouth at bedtime.   Nurtec 75 MG Tbdp Generic drug: Rimegepant Sulfate Take 1 tablet (75 mg total) by mouth as needed (for severe migraine).   Omega-3 1000 MG Caps Take 2,000 mg by mouth.  ondansetron 4 MG tablet Commonly known as: ZOFRAN Take 1-2 tablets (4-8 mg total) by mouth every 8 (eight) hours as needed for nausea or vomiting.   predniSONE 20 MG tablet Commonly known as: DELTASONE 60 mg x1d, 40 mg x4d, 20 mg x3d, 10 mg x2d   topiramate 50 MG tablet Commonly known as: TOPAMAX Take 1 tablet (50 mg total) by mouth 2 (two) times daily.   venlafaxine XR 150 MG 24 hr capsule Commonly known as: EFFEXOR-XR Take 1 capsule  (150 mg total) by mouth daily with breakfast.        All past medical history, surgical history, allergies, family history, immunizations andmedications were updated in the EMR today and reviewed under the history and medication portions of their EMR.     Review of Systems  Constitutional:  Positive for malaise/fatigue. Negative for chills and fever.  HENT:  Positive for congestion. Negative for sinus pain.   Respiratory:  Positive for cough and sputum production.   Gastrointestinal:  Negative for diarrhea, nausea and vomiting.  Musculoskeletal:  Negative for myalgias.  Skin:  Negative for rash.  Neurological:  Positive for dizziness. Negative for headaches.   Negative, with the exception of above mentioned in HPI   Objective:  BP 128/74   Pulse 76   Temp (!) 97.4 F (36.3 C)   Wt 246 lb (111.6 kg)   LMP 08/23/2017   SpO2 94%   BMI 42.23 kg/m  Body mass index is 42.23 kg/m. Physical Exam Vitals and nursing note reviewed.  Constitutional:      General: She is not in acute distress.    Appearance: Normal appearance. She is not ill-appearing, toxic-appearing or diaphoretic.  HENT:     Head: Normocephalic and atraumatic.     Right Ear: Hearing, tympanic membrane, ear canal and external ear normal.     Left Ear: Hearing, ear canal and external ear normal. A middle ear effusion is present.     Nose: Congestion (mild) present. No mucosal edema or rhinorrhea.     Right Turbinates: Not swollen.     Left Turbinates: Not swollen.     Right Sinus: No maxillary sinus tenderness or frontal sinus tenderness.     Left Sinus: No maxillary sinus tenderness or frontal sinus tenderness.  Eyes:     General: No scleral icterus.       Right eye: No discharge.        Left eye: No discharge.     Extraocular Movements: Extraocular movements intact.     Conjunctiva/sclera: Conjunctivae normal.     Pupils: Pupils are equal, round, and reactive to light.  Cardiovascular:     Rate and Rhythm:  Normal rate and regular rhythm.  Pulmonary:     Effort: Pulmonary effort is normal. No respiratory distress.     Breath sounds: No wheezing, rhonchi or rales.     Comments: Cough with deep inspiration  Musculoskeletal:     Right lower leg: No edema.     Left lower leg: No edema.  Lymphadenopathy:     Cervical: No cervical adenopathy.  Skin:    General: Skin is warm.     Findings: No rash.  Neurological:     Mental Status: She is alert and oriented to person, place, and time. Mental status is at baseline.     Motor: No weakness.     Gait: Gait normal.  Psychiatric:        Mood and Affect: Mood normal.  Behavior: Behavior normal.        Thought Content: Thought content normal.        Judgment: Judgment normal.     No results found. No results found. No results found for this or any previous visit (from the past 24 hours).  Assessment/Plan: Shannon Quinn is a 53 y.o. female present for OV for  Bacterial resp infection/dizziness Rest, hydrate.  Encouraged her to take DEEP breaths 2-4 x hour.  Pulse improved, breathing better. No fever.  Cbc and bmp collected today If cough present it can last up to 6-8 weeks.  Work from home excuse 1 week for dizziness/illness.  Cxr 09/15/2023 without pna.  F/u dependent on lab results.   Reviewed expectations re: course of current medical issues. Discussed self-management of symptoms. Outlined signs and symptoms indicating need for more acute intervention. Patient verbalized understanding and all questions were answered. Patient received an After-Visit Summary.    Orders Placed This Encounter  Procedures   CBC w/Diff   Basic Metabolic Panel (BMET)   No orders of the defined types were placed in this encounter.  Referral Orders  No referral(s) requested today     Note is dictated utilizing voice recognition software. Although note has been proof read prior to signing, occasional typographical errors still can be missed. If  any questions arise, please do not hesitate to call for verification.   electronically signed by:  Felix Pacini, DO  [facility] [place]

## 2023-09-23 NOTE — Patient Instructions (Signed)

## 2023-09-24 ENCOUNTER — Other Ambulatory Visit: Payer: Self-pay | Admitting: Family Medicine

## 2023-09-24 ENCOUNTER — Encounter: Payer: Self-pay | Admitting: Family Medicine

## 2023-09-24 MED ORDER — POTASSIUM CHLORIDE CRYS ER 20 MEQ PO TBCR: 20.0000 meq | EXTENDED_RELEASE_TABLET | Freq: Two times a day (BID) | ORAL | 0 refills | Status: DC

## 2023-10-10 DIAGNOSIS — Z13220 Encounter for screening for lipoid disorders: Secondary | ICD-10-CM | POA: Diagnosis not present

## 2023-10-10 DIAGNOSIS — Z136 Encounter for screening for cardiovascular disorders: Secondary | ICD-10-CM | POA: Diagnosis not present

## 2023-10-10 DIAGNOSIS — Z713 Dietary counseling and surveillance: Secondary | ICD-10-CM | POA: Diagnosis not present

## 2023-10-10 DIAGNOSIS — Z6841 Body Mass Index (BMI) 40.0 and over, adult: Secondary | ICD-10-CM | POA: Diagnosis not present

## 2023-12-17 ENCOUNTER — Ambulatory Visit: Payer: Self-pay | Admitting: *Deleted

## 2023-12-17 ENCOUNTER — Ambulatory Visit: Payer: Self-pay

## 2023-12-17 NOTE — Telephone Encounter (Signed)
 Message from [employer] H sent at 12/17/2023  8:14 AM EDT  Copied From CRM [id number]. Reason for Triage: Patient called this morning requesting a same-day appointment for abdominal pain that began last night and has been intermittent since. She reports feeling nauseated and is unsure if it is related to her stomach or stress. Patient wants to be evaluated to determine if the symptoms may be nerve-related.  ----- Message from [employer] H sent at 12/17/2023  8:09 AM EDT ----- Copied From CRM [id number]. Reason for Triage: Patient called this morning requesting a same-day appointment for abdominal pain that began last night and has been intermittent since. She reports feeling nauseated and is unsure if it is related to her stomach or stress. Patient wants to be evaluated to determine if the symptoms may be nerve-related.    Call History  Contact Date/Time Type Contact Phone/Fax By  12/17/2023 08:02 AM EDT Phone (Incoming) [place], [place] [phone] Adron Horns   Reason for Disposition  [1] MODERATE pain (e.g., interferes with normal activities) AND [2] pain comes and goes (cramps) AND [3] present > 24 hours  (Exception: Pain with Vomiting or Diarrhea - see that Guideline.)  Answer Assessment - Initial Assessment Questions 1. LOCATION: "Where does it hurt?"      I'm having abd pain on and off for a while.   This has been a problem.   Yesterday it got worse and I woke up with it.   I'm having nausea this morning. It started on left side near my hip and moved to right side.   Now it's in the middle above my belly button.    2. RADIATION: "Does the pain shoot anywhere else?" (e.g., chest, back)     See above Last time this happened I was very constipated.   It was post op. 3. ONSET: "When did the pain begin?" (e.g., minutes, hours or days ago)      Carolin Chyle it got worse 4. SUDDEN: "Gradual or sudden onset?"     It gotten worse this morning.   I'm having nausea. 5. PATTERN "Does the pain come and go, or  is it constant?"    - If it comes and goes: "How long does it last?" "Do you have pain now?"     (Note: Comes and goes means the pain is intermittent. It goes away completely between bouts.)    - If constant: "Is it getting better, staying the same, or getting worse?"      (Note: Constant means the pain never goes away completely; most serious pain is constant and gets worse.)      It comes and goes. I'm having BMs.     I'm eating more veggies.   I'm having gas. 6. SEVERITY: "How bad is the pain?"  (e.g., Scale 1-10; mild, moderate, or severe)    - MILD (1-3): Doesn't interfere with normal activities, abdomen soft and not tender to touch.     - MODERATE (4-7): Interferes with normal activities or awakens from sleep, abdomen tender to touch.     - SEVERE (8-10): Excruciating pain, doubled over, unable to do any normal activities.       Moderate 7. RECURRENT SYMPTOM: "Have you ever had this type of stomach pain before?" If Yes, ask: "When was the last time?" and "What happened that time?"      Yes 8. CAUSE: "What do you think is causing the stomach pain?"     I'm not sure this time. 9. RELIEVING/AGGRAVATING FACTORS: "  What makes it better or worse?" (e.g., antacids, bending or twisting motion, bowel movement)     I took Tylenol  yesterday.    I'm on so many medications so I try not to take anything 10. OTHER SYMPTOMS: "Do you have any other symptoms?" (e.g., back pain, diarrhea, fever, urination pain, vomiting)       Nausea.   My BMs are normal and regular 11. PREGNANCY: "Is there any chance you are pregnant?" "When was your last menstrual period?"       N/A due to age  Protocols used: Abdominal Pain - [facility]

## 2023-12-17 NOTE — Telephone Encounter (Signed)
  Chief Complaint: Abd pain and nausea getting worse today Symptoms: Pain started on left lower abd yesterday and today it is in the middle of my abd above my belly button and hurting worse plus I'm having nausea. Frequency: Started yesterday but it's been going on intermittently "for a while".   "This is not my first rodeo with this".   Pertinent Negatives: Patient denies diarrhea, constipation, fever.  Has a history of constipation. Disposition: [] ED /[] Urgent Care (no appt availability in office) / [x] Appointment(In office/virtual)/ []  [facility]/ [] Home Care/ [] Refused Recommended Disposition /[] [facility]/ []  Follow-up with PCP Additional Notes: Appt made with Dr. Kuneff for 12/18/2023 at 2:40.

## 2023-12-17 NOTE — Telephone Encounter (Signed)
No further action needed. Pt scheduled.

## 2023-12-17 NOTE — Telephone Encounter (Signed)
 This is a duplicate. Pt was already triaged for her symptoms by another nurse. See other Nurse Triage encounter.  Copied from CRM [id number]. Topic: Clinical - Pink Word Triage >> Dec 17, 2023  8:08 AM Shannon Quinn wrote: Reason for Triage: Patient called this morning requesting a same-day appointment due to abdominal pain that began last night and has been intermittent since. She reported feeling nauseous but is unsure if it is related to her stomach or possibly stress. She stated the abdominal pain is not severe and denied diarrhea or vomiting. Patient would like to be seen to determine if the symptoms may be nerve-related.

## 2023-12-18 ENCOUNTER — Telehealth: Payer: Self-pay | Admitting: Family Medicine

## 2023-12-18 ENCOUNTER — Encounter: Payer: Self-pay | Admitting: Family Medicine

## 2023-12-18 ENCOUNTER — Encounter (HOSPITAL_BASED_OUTPATIENT_CLINIC_OR_DEPARTMENT_OTHER): Payer: Self-pay

## 2023-12-18 ENCOUNTER — Ambulatory Visit (HOSPITAL_BASED_OUTPATIENT_CLINIC_OR_DEPARTMENT_OTHER)
Admission: RE | Admit: 2023-12-18 | Discharge: 2023-12-18 | Disposition: A | Discharge status: Home / Self Care | Source: Ambulatory Visit | Attending: Family Medicine | Admitting: Family Medicine

## 2023-12-18 ENCOUNTER — Ambulatory Visit: Admitting: Family Medicine

## 2023-12-18 VITALS — BP 126/84 | HR 68 | Temp 98.1°F | Wt 242.6 lb

## 2023-12-18 DIAGNOSIS — R112 Nausea with vomiting, unspecified: Secondary | ICD-10-CM | POA: Insufficient documentation

## 2023-12-18 DIAGNOSIS — K7689 Other specified diseases of liver: Secondary | ICD-10-CM | POA: Diagnosis not present

## 2023-12-18 DIAGNOSIS — K760 Fatty (change of) liver, not elsewhere classified: Secondary | ICD-10-CM | POA: Diagnosis not present

## 2023-12-18 DIAGNOSIS — R109 Unspecified abdominal pain: Secondary | ICD-10-CM | POA: Diagnosis not present

## 2023-12-18 DIAGNOSIS — R1084 Generalized abdominal pain: Secondary | ICD-10-CM | POA: Insufficient documentation

## 2023-12-18 LAB — COMPREHENSIVE METABOLIC PANEL WITH GFR
ALT: 50 U/L — ABNORMAL HIGH (ref 0–35)
AST: 34 U/L (ref 0–37)
Albumin: 4.4 g/dL (ref 3.5–5.2)
Alkaline Phosphatase: 85 U/L (ref 39–117)
BUN: 17 mg/dL (ref 6–23)
CO2: 23 meq/L (ref 19–32)
Calcium: 9.9 mg/dL (ref 8.4–10.5)
Chloride: 106 meq/L (ref 96–112)
Creatinine, Ser: 1.01 mg/dL (ref 0.40–1.20)
GFR: 63 mL/min (ref >60.00)
Glucose, Bld: 111 mg/dL — ABNORMAL HIGH (ref 70–99)
Potassium: 4.1 meq/L (ref 3.5–5.1)
Sodium: 139 meq/L (ref 135–145)
Total Bilirubin: 0.4 mg/dL (ref 0.2–1.2)
Total Protein: 7.5 g/dL (ref 6.0–8.3)

## 2023-12-18 LAB — CBC WITH DIFFERENTIAL/PLATELET
Basophils Absolute: 0.1 10*3/uL (ref 0.0–0.1)
Basophils Relative: 1 % (ref 0.0–3.0)
Eosinophils Absolute: 0.2 10*3/uL (ref 0.0–0.7)
Eosinophils Relative: 2.3 % (ref 0.0–5.0)
HCT: 46.3 % — ABNORMAL HIGH (ref 36.0–46.0)
Hemoglobin: 15.2 g/dL — ABNORMAL HIGH (ref 12.0–15.0)
Lymphocytes Relative: 38 % (ref 12.0–46.0)
Lymphs Abs: 2.8 10*3/uL (ref 0.7–4.0)
MCHC: 32.7 g/dL (ref 30.0–36.0)
MCV: 90.9 fl (ref 78.0–100.0)
Monocytes Absolute: 0.3 10*3/uL (ref 0.1–1.0)
Monocytes Relative: 4.7 % (ref 3.0–12.0)
Neutro Abs: 3.9 10*3/uL (ref 1.4–7.7)
Neutrophils Relative %: 54 % (ref 43.0–77.0)
Platelets: 306 10*3/uL (ref 150.0–400.0)
RBC: 5.09 Mil/uL (ref 3.87–5.11)
RDW: 14.5 % (ref 11.5–15.5)
WBC: 7.3 10*3/uL (ref 4.0–10.5)

## 2023-12-18 LAB — LIPASE: Lipase: 20 U/L (ref 11.0–59.0)

## 2023-12-18 LAB — C-REACTIVE PROTEIN: CRP: <1 mg/dL (ref 0.5–20.0)

## 2023-12-18 MED ORDER — IOHEXOL 300 MG/ML  SOLN
100.0000 mL | Freq: Once | INTRAMUSCULAR | Status: AC | PRN
  Administered 2023-12-18: 100 mL via INTRAVENOUS

## 2023-12-18 MED ORDER — PANTOPRAZOLE SODIUM 40 MG PO TBEC: 40.0000 mg | DELAYED_RELEASE_TABLET | Freq: Every day | ORAL | 3 refills | Status: DC

## 2023-12-18 MED ORDER — SUCRALFATE 1 G PO TABS: 1.0000 g | ORAL_TABLET | Freq: Three times a day (TID) | ORAL | 0 refills | Status: DC

## 2023-12-18 NOTE — Telephone Encounter (Signed)
 Pt aware and verbalized understanding. Provider notes sent to pt MyChart.

## 2023-12-18 NOTE — Telephone Encounter (Signed)
 Please call patient  Her abdominal CT overall is reassuring without evidence of diverticulitis, colitis or appendicitis. She does have multiple liver cysts which have been present for some time when reviewing past CT scans.  However one of the liver cysts has enlarged and is now 8.5 cm.  Liver cyst larger than 5 cm, can cause symptoms of abdominal pain, bloating and nausea etc.  I would encourage her to take the medication prescribed today as indicated. The Carafate for at least 2 weeks and the Protonix for at least 4 weeks.   We will place a referral to have her evaluated by surgery to discuss need of further intervention.

## 2023-12-18 NOTE — Progress Notes (Signed)
 Shannon Quinn , 1971-05-23, 53 y.o., female MRN: [id number] Patient Care Team    Relationship Specialty Notifications Start End  Mariel Shope, DO PCP - General Family Medicine  11/03/17   Ammon Bales, MD (Inactive) Consulting Physician Otolaryngology  11/03/17   Daryel Ensign, DO Consulting Physician Neurology  11/03/17   Winston Hawking, MD Consulting Physician Orthopedic Surgery  06/01/19   Boyce Byes, MD Consulting Physician General Surgery  06/01/19   Tobin Forts, MD Consulting Physician Gastroenterology  05/23/21   Maudine Sos, MD Attending Physician Cardiology  05/23/21     Chief Complaint  Patient presents with   Abdominal Pain    1 month;Started on her sides, now in the middle of stomach. Abdominal pain/discomfort, nausea, vomiting. Pt has taken Tylenol .      Subjective: Shannon Quinn is a 53 y.o. Pt presents for an OV with complaints of 1 month of abd pain duration.  Associated symptoms include nausea and vomit that started this week.Eating hurts her stomach.  Pain mostly RLQ, but can cause a sharp pain near umbilicus, LLQ and epigastric area.  Daughter had her GB removed in April and her mother recently had a urgent hernia surgery.  Patient has a h/o endometriosis> hysterectomy Pt has tried nothing to ease their symptoms.  Bowel movements regular formed. No urinary symptoms Colonoscopy 06/2019- normal. Dr. Elvin Hammer. No fhx of IBD. Colon cancer in paternal uncle present Pt endorses increase in stress at home.  She has had GERD in the past     02/11/2023   11:24 AM 06/21/2022   10:50 AM 01/04/2022    3:05 PM 11/15/2021    9:07 AM 05/23/2021   10:04 AM  Depression screen PHQ 2/9  Decreased Interest 3 1 1 2 1   Down, Depressed, Hopeless 3 1 1 2    PHQ - 2 Score 6 2 2 4 1   Altered sleeping 3  2 3  0  Tired, decreased energy 3 2 3 2 2   Change in appetite 3 0 0 1 0  Feeling bad or failure about yourself  3 0 0 0 1  Trouble concentrating 3 3 1 2 1   Moving  slowly or fidgety/restless 3 1 1 2 1   Suicidal thoughts  0 0 0 0  PHQ-9 Score 24  9 14 6   Difficult doing work/chores Extremely dIfficult        Allergies  Allergen Reactions   Chlorhexidine  Other (See Comments)    Red and purple burns and peeled skin. Cannot use surgical glue.  Pt states Betadine is safe and without reaction.   Crestor  [Rosuvastatin ] Swelling    Swelling in her hands   Neomycin     UNSPECIFIED REACTION    Other Other (See Comments)    Body glue, turns skin blue   Sulfamethoxazole-Trimethoprim     UNSPECIFIED REACTION    Sulfasalazine Other (See Comments)   Sulfonamide Derivatives     UNSPECIFIED REACTION    Augmentin [Amoxicillin -Pot Clavulanate] Nausea And Vomiting    amox ok.    Hydrocodone Nausea And Vomiting    Severe vomiting   Oxycodone  Nausea And Vomiting    Severe vomiting   Prednisone  Other (See Comments)    insomnia   Social History   Social History Narrative   Regular exercise: walking 1 mile per day   Caffeine use: soda drinker   Lives with husband and 2 children in a one story home.     Works for [employer]  of [place].  She works in Animal nutritionist   Education: associates degree.     Past Medical History:  Diagnosis Date   Abnormality of right breast on screening mammogram 10/09/2018   Allergy    Atypical chest pain 06/07/2019   Atypical migraine    Claudication in peripheral vascular disease (HCC) 07/10/2021   Colon polyps 2011   3 polpys removed   Complication of anesthesia    "severe Reaction" "Physically ill"    Deviated septum    Endometriosis    Family history of adverse reaction to anesthesia    violent when waking up from anesthesia - very sick   Hepatic cyst    chronic- stable by yearly PET performed by onc   Hypertension    Hypoglycemic syndrome 1996   during pregnancy   Hypothyroidism    Impingement syndrome of right shoulder region 06/04/2018   Kidney stone    Lymphedema of right arm     Melanoma (HCC)    03/10/2018   PONV (postoperative nausea and vomiting)    Shoulder injury    right - side happened from last surgery   Thyroid  disease    Varicose vein of leg    Past Surgical History:  Procedure Laterality Date   BREAST LUMPECTOMY WITH RADIOACTIVE SEED LOCALIZATION Right 10/09/2018   Procedure: RIGHT BREAST LUMPECTOMY WITH RADIOACTIVE SEED LOCALIZATION ERAS PATHWAY;  Surgeon: Boyce Byes, MD;  Location: [facility];  Service: General;  Laterality: Right;   COLONOSCOPY     polyps   ENDOVENOUS ABLATION SAPHENOUS VEIN W/ LASER Left 01/31/2022   endovenous laser ablation left greater saphenous vein and stab phlebectomy<10 incisions left leg by Genny Kid MD   LAPAROSCOPIC TOTAL HYSTERECTOMY  09/04/2017   Dr. Yehuda Helms for endometriosis   MELANOMA EXCISION WITH SENTINEL LYMPH NODE BIOPSY Left 04/02/2018   Procedure: INJECT BLUE DYE MELANOMA LEFT BACK WITH  WIDE LOCAL EXCISION MELANOMA LEFT BACK BILATERAL AXILLARY DEEP SENTINEL LYMPH NODE BIOPSY;  Surgeon: Boyce Byes, MD;  Location: [facility];  Service: General;  Laterality: Left;   TUBAL LIGATION     WISDOM TOOTH EXTRACTION     Family History  Problem Relation Age of Onset   Arthritis Mother    Dementia Mother 19   Stroke Mother    Osteoporosis Mother    Alcohol abuse Father    Arthritis Father    Hyperlipidemia Father    Stroke Father        2012   Depression Father    Diabetes Father    Heart disease Father 64   Heart failure Father    Heart attack Father    Diabetes Sister    Lung cancer Sister        stage 4, met to Brain   CAD Maternal Uncle    Colon cancer Maternal Uncle    Cancer Maternal Uncle    CAD Paternal Aunt    Congestive Heart Failure Paternal Aunt    Kidney failure Paternal Aunt    Diabetes Paternal Aunt    Cancer Paternal Uncle        breast cancer   Colon cancer Paternal Uncle    Breast cancer Maternal Grandmother 21   Dementia Maternal  Grandmother    Heart disease Maternal Grandfather 78   CAD Maternal Grandfather    Diabetes Paternal Grandmother    COPD Paternal Grandfather        emphysema   Esophageal cancer Neg Hx  Stomach cancer Neg Hx    Rectal cancer Neg Hx    Allergies as of 12/18/2023       Reactions   Chlorhexidine  Other (See Comments)   Red and purple burns and peeled skin. Cannot use surgical glue.  Pt states Betadine is safe and without reaction.   Crestor  [rosuvastatin ] Swelling   Swelling in her hands   Neomycin    UNSPECIFIED REACTION    Other Other (See Comments)   Body glue, turns skin blue   Sulfamethoxazole-trimethoprim    UNSPECIFIED REACTION    Sulfasalazine Other (See Comments)   Sulfonamide Derivatives    UNSPECIFIED REACTION    Augmentin [amoxicillin -pot Clavulanate] Nausea And Vomiting   amox ok.    Hydrocodone Nausea And Vomiting   Severe vomiting   Oxycodone  Nausea And Vomiting   Severe vomiting   Prednisone  Other (See Comments)   insomnia        Medication List        Accurate as of Dec 18, 2023  9:11 AM. If you have any questions, ask your nurse or doctor.          STOP taking these medications    benzonatate  200 MG capsule Commonly known as: TESSALON  Stopped by: Napolean Backbone   cefdinir  300 MG capsule Commonly known as: OMNICEF  Stopped by: Napolean Backbone   montelukast  10 MG tablet Commonly known as: SINGULAIR  Stopped by: Napolean Backbone   predniSONE  20 MG tablet Commonly known as: DELTASONE  Stopped by: Napolean Backbone       TAKE these medications    Airsupra  90-80 MCG/ACT Aero Generic drug: Albuterol -Budesonide Inhale 2 puffs into the lungs every 6 (six) hours as needed (Do not exceed 12 inhalations in 24 hours).   Albuterol  Sulfate 108 (90 Base) MCG/ACT Aepb Commonly known as: PROAIR  RESPICLICK Inhale 2 puffs into the lungs every 6 (six) hours as needed.   amitriptyline  10 MG tablet Commonly known as: ELAVIL  Take 1 tablet (10 mg total) by mouth  at bedtime as needed for sleep.   aspirin  EC 81 MG tablet Take 1 tablet (81 mg total) by mouth daily.   clonazePAM  0.5 MG tablet Commonly known as: KLONOPIN  Take 1 tablet (0.5 mg total) by mouth 2 (two) times daily as needed for anxiety.   Excedrin Migraine 250-250-65 MG tablet Generic drug: aspirin -acetaminophen -caffeine Take 1-2 tablets by mouth every 6 (six) hours as needed for headache or migraine.   ezetimibe  10 MG tablet Commonly known as: ZETIA  Take 1 tablet (10 mg total) by mouth daily.   fluticasone  50 MCG/ACT nasal spray Commonly known as: FLONASE  SPRAY 2 SPRAYS INTO EACH NOSTRIL EVERY DAY   hydrochlorothiazide  25 MG tablet Commonly known as: HYDRODIURIL  Take 1 tablet (25 mg total) by mouth daily.   hydrOXYzine  10 MG tablet Commonly known as: ATARAX  Take 1-3 tablets (10-30 mg total) by mouth 2 (two) times daily as needed.   ipratropium 0.06 % nasal spray Commonly known as: ATROVENT  Place 2 sprays into both nostrils 4 (four) times daily.   Nurtec 75 MG Tbdp Generic drug: Rimegepant Sulfate Take 1 tablet (75 mg total) by mouth as needed (for severe migraine).   Omega-3 1000 MG Caps Take 2,000 mg by mouth.   ondansetron  4 MG tablet Commonly known as: ZOFRAN  Take 1-2 tablets (4-8 mg total) by mouth every 8 (eight) hours as needed for nausea or vomiting.   pantoprazole 40 MG tablet Commonly known as: PROTONIX Take 1 tablet (40 mg total) by mouth daily. Started by:  Jazmin Vensel   potassium chloride  SA 20 MEQ tablet Commonly known as: KLOR-CON  M Take 1 tablet (20 mEq total) by mouth 2 (two) times daily.   sucralfate 1 g tablet Commonly known as: Carafate Take 1 tablet (1 g total) by mouth 4 (four) times daily -  with meals and at bedtime. Started by: Napolean Backbone   topiramate  50 MG tablet Commonly known as: TOPAMAX  Take 1 tablet (50 mg total) by mouth 2 (two) times daily.   venlafaxine  XR 150 MG 24 hr capsule Commonly known as: EFFEXOR -XR Take 1  capsule (150 mg total) by mouth daily with breakfast.        All past medical history, surgical history, allergies, family history, immunizations andmedications were updated in the EMR today and reviewed under the history and medication portions of their EMR.     ROS Negative, with the exception of above mentioned in HPI   Objective:  BP 126/84   Pulse 68   Temp 98.1 F (36.7 C)   Wt 242 lb 9.6 oz (110 kg)   LMP 08/23/2017   SpO2 97%   BMI 41.64 kg/m  Body mass index is 41.64 kg/m. Physical Exam Vitals and nursing note reviewed.  Constitutional:      General: She is not in acute distress.    Appearance: Normal appearance. She is obese. She is not ill-appearing, toxic-appearing or diaphoretic.     Comments: Holding RLL side of abd.   HENT:     Head: Normocephalic and atraumatic.  Eyes:     General: No scleral icterus.       Right eye: No discharge.        Left eye: No discharge.     Extraocular Movements: Extraocular movements intact.     Conjunctiva/sclera: Conjunctivae normal.     Pupils: Pupils are equal, round, and reactive to light.  Cardiovascular:     Rate and Rhythm: Normal rate and regular rhythm.  Pulmonary:     Effort: Pulmonary effort is normal. No respiratory distress.     Breath sounds: Normal breath sounds. No wheezing, rhonchi or rales.  Abdominal:     General: Abdomen is protuberant. Bowel sounds are normal. There is no distension.     Palpations: Abdomen is soft. There is no shifting dullness, hepatomegaly, splenomegaly or mass.     Tenderness: There is abdominal tenderness in the right lower quadrant, epigastric area, periumbilical area and left lower quadrant. There is rebound. There is no right CVA tenderness, left CVA tenderness or guarding. Positive signs include Murphy's sign, McBurney's sign and psoas sign.     Hernia: No hernia is present.  Musculoskeletal:     Cervical back: Neck supple.     Right lower leg: No edema.     Left lower leg:  No edema.  Skin:    General: Skin is warm.     Findings: No rash.  Neurological:     Mental Status: She is alert and oriented to person, place, and time. Mental status is at baseline.     Motor: No weakness.     Gait: Gait normal.  Psychiatric:        Mood and Affect: Mood normal.        Behavior: Behavior normal.        Thought Content: Thought content normal.        Judgment: Judgment normal.     No results found. No results found. No results found for this or any previous visit (from  the past 24 hours).  Assessment/Plan: Rhyan Lope is a 53 y.o. female present for OV for  Generalized abdominal pain (Primary)/Nausea and vomiting, unspecified vomiting type Significant RLL pain on exam and pt holding her abd. VZD:GLOVFIEPP colitis, duodenitis, GB etiology, appendix, diverticulitis discussed.  Extremely tender over duodenal bulb as well, with pain of stomach while eating. Increased belching> start PPI and carafate - CT ABDOMEN PELVIS W CONTRAST; Future - CBC w/Diff - Comp Met (CMET) - Lipase - C-reactive protein Elected to start with PPI, carafate tx and obtain STAT labs and image.     Reviewed expectations re: course of current medical issues. Discussed self-management of symptoms. Outlined signs and symptoms indicating need for more acute intervention. Patient verbalized understanding and all questions were answered. Patient received an After-Visit Summary.    Orders Placed This Encounter  Procedures   CT ABDOMEN PELVIS W CONTRAST   CBC w/Diff   Comp Met (CMET)   Lipase   C-reactive protein   Meds ordered this encounter  Medications   pantoprazole (PROTONIX) 40 MG tablet    Sig: Take 1 tablet (40 mg total) by mouth daily.    Dispense:  30 tablet    Refill:  3   sucralfate (CARAFATE) 1 g tablet    Sig: Take 1 tablet (1 g total) by mouth 4 (four) times daily -  with meals and at bedtime.    Dispense:  120 tablet    Refill:  0   Referral Orders  No  referral(s) requested today     Note is dictated utilizing voice recognition software. Although note has been proof read prior to signing, occasional typographical errors still can be missed. If any questions arise, please do not hesitate to call for verification.   electronically signed by:  Napolean Backbone, DO  [facility] - [place]

## 2023-12-19 ENCOUNTER — Telehealth: Payer: Self-pay

## 2023-12-19 NOTE — Telephone Encounter (Signed)
**Note De-identified  [place] Obfuscation** Please advise 

## 2023-12-19 NOTE — Telephone Encounter (Signed)
 Copied from CRM [id number]. Topic: General - Other >> Dec 18, 2023  1:11 PM Deaijah H wrote: Reason for CRM: [facility] called in stating patient needs an prior authorization for the CT scan but patient does not want to wait.

## 2023-12-22 ENCOUNTER — Ambulatory Visit: Payer: BC Managed Care – PPO | Admitting: Neurology

## 2023-12-22 ENCOUNTER — Encounter: Payer: Self-pay | Admitting: Neurology

## 2023-12-22 VITALS — BP 105/69 | HR 92 | Resp 20 | Ht 64.0 in | Wt 241.0 lb

## 2023-12-22 DIAGNOSIS — G43701 Chronic migraine without aura, not intractable, with status migrainosus: Secondary | ICD-10-CM | POA: Diagnosis not present

## 2023-12-22 MED ORDER — TOPIRAMATE 50 MG PO TABS: 50.0000 mg | ORAL_TABLET | Freq: Two times a day (BID) | ORAL | 1 refills | Status: DC

## 2023-12-22 MED ORDER — NURTEC 75 MG PO TBDP: 75.0000 mg | ORAL_TABLET | ORAL | 3 refills | Status: AC | PRN

## 2023-12-22 NOTE — Telephone Encounter (Signed)
 Visit completed before PA. Pt was aware of no precert but didn't want to wait

## 2023-12-22 NOTE — Progress Notes (Signed)
 Follow-up Visit   Date: 12/22/2023    Shannon Quinn MRN: [id number] DOB: 13-Jun-1971    Shannon Quinn is a 53 y.o. Caucasian female with melanoma, hypertension, hyperlipidemia, depression/anxiety returning to the clinic for follow-up of migraine.  The patient was accompanied to the clinic by self.     IMPRESSION/PLAN: Chronic migraine without aura, now with migraines once per month  - Continue topiramate  50mg  twice daily  - She takes amitriptyline  10mg  for insomnia and venlafaxine  150mg  for depression  Episodic migraine without aura  - Continue Nurtec 75mg  for severe migraine, which is very effective  Return to clinic in 1 year  --------------------------------------------- History of present illness: Headaches started in 2019 after having melanoma resection in 2019.  Pain starts in variable locations, usually stress-related pain starts in the base of the neck and radiates of her head.  Weather-related triggers are sharp and occur in random locations.  Pain is characterized as tight-pressure, sharp stabbing pain, or throbbing.  Pain lasts ~2-7 days, on average usually about 3 days.  She endorses photophobia, phonophobia, nausea, and vomiting.  She hears sounds like cicadas prior to headache onset and throughout the duration.  She is currently on topiramate  50mg  daily and tolerating it well.  She does feel that it helps.  For rescue, she takes tylenol , excedrin, and ibuprofen  which she takes 3 days per week.  She takes maxalt  3-4 times per month which helps. She also takes atarax  and venlafaxine  150mg  which she takes for hot flashes.   She takes amitriptyline  10mg  at bedtime for sleep.  She is very sedated so only takes it on the weekend.    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 She endorses neck tension and feels that stretching helps.  She has done neck PT about 2 years ago.  MRI brain in 2021 was normal.   UPDATE 02/17/2023:  She is here for follow-up visit.  She  continues to have headaches about 2-4 days per month, which is responsive to rizatriptan . However, she does report to taking rizatriptan  every 2 hours until headache is resolved.  She was not clear on the instructed to limited to twice in 24 hours.  She feels that her headaches would be better, but she recently has been under a lot of stress - sister with stage 4 cancer and dog who passed last week.   UPDATE 06/24/2023:  She is here for follow-up visit.  She is taking topiramate  50mg  twice daily and reports significant benefit with Nurtec for acute migraine.  Headaches are better controlled and she has about 3-4 per month.  She reports having personal stressors.  Her sister passed in September and there have been family issues related to this.   UPDATE 12/22/2023:  She is here for follow-up visit.  She gets migraines about once per month, which is very responsive to Nurtec 75mg  as needed.  Severity of pain and associated nausea/vomiting has also improved. She is tolerating topirmate and Nurtec very well.  No new neurological complaints. She has abdominal pain which is being evaluated for.   Medications:  Current Outpatient Medications on File Prior to Visit  Medication Sig Dispense Refill   Albuterol  Sulfate (PROAIR  RESPICLICK) 108 (90 Base) MCG/ACT AEPB Inhale 2 puffs into the lungs every 6 (six) hours as needed. 1 each 5   Albuterol -Budesonide (AIRSUPRA ) 90-80 MCG/ACT AERO Inhale 2 puffs into the lungs every 6 (six) hours as needed (Do not exceed 12 inhalations in 24 hours). 1 g  11   amitriptyline  (ELAVIL ) 10 MG tablet Take 1 tablet (10 mg total) by mouth at bedtime as needed for sleep. 90 tablet 1   aspirin  EC 81 MG tablet Take 1 tablet (81 mg total) by mouth daily. 90 tablet 3   aspirin -acetaminophen -caffeine (EXCEDRIN MIGRAINE) 250-250-65 MG tablet Take 1-2 tablets by mouth every 6 (six) hours as needed for headache or migraine.     clonazePAM  (KLONOPIN ) 0.5 MG tablet Take 1 tablet (0.5 mg total)  by mouth 2 (two) times daily as needed for anxiety. 60 tablet 5   ezetimibe  (ZETIA ) 10 MG tablet Take 1 tablet (10 mg total) by mouth daily. 90 tablet 3   fluticasone  (FLONASE ) 50 MCG/ACT nasal spray SPRAY 2 SPRAYS INTO EACH NOSTRIL EVERY DAY 16 mL 1   hydrochlorothiazide  (HYDRODIURIL ) 25 MG tablet Take 1 tablet (25 mg total) by mouth daily. 90 tablet 1   ipratropium (ATROVENT ) 0.06 % nasal spray Place 2 sprays into both nostrils 4 (four) times daily. 15 mL 12   Omega-3 1000 MG CAPS Take 2,000 mg by mouth.     ondansetron  (ZOFRAN ) 4 MG tablet Take 1-2 tablets (4-8 mg total) by mouth every 8 (eight) hours as needed for nausea or vomiting. 40 tablet 5   pantoprazole  (PROTONIX ) 40 MG tablet Take 1 tablet (40 mg total) by mouth daily. 30 tablet 3   Rimegepant Sulfate (NURTEC) 75 MG TBDP Take 1 tablet (75 mg total) by mouth as needed (for severe migraine). 8 tablet 3   sucralfate  (CARAFATE ) 1 g tablet Take 1 tablet (1 g total) by mouth 4 (four) times daily -  with meals and at bedtime. 120 tablet 0   topiramate  (TOPAMAX ) 50 MG tablet Take 1 tablet (50 mg total) by mouth 2 (two) times daily. 180 tablet 1   venlafaxine  XR (EFFEXOR -XR) 150 MG 24 hr capsule Take 1 capsule (150 mg total) by mouth daily with breakfast. 90 capsule 1   hydrOXYzine  (ATARAX ) 10 MG tablet Take 1-3 tablets (10-30 mg total) by mouth 2 (two) times daily as needed. (Patient not taking: Reported on 12/22/2023) 90 tablet 5   potassium chloride  SA (KLOR-CON  M) 20 MEQ tablet Take 1 tablet (20 mEq total) by mouth 2 (two) times daily. (Patient not taking: Reported on 12/22/2023) 8 tablet 0   No current facility-administered medications on file prior to visit.    Allergies:  Allergies  Allergen Reactions   Chlorhexidine  Other (See Comments)    Red and purple burns and peeled skin. Cannot use surgical glue.  Pt states Betadine is safe and without reaction.   Crestor  [Rosuvastatin ] Swelling    Swelling in her hands   Neomycin      UNSPECIFIED REACTION    Other Other (See Comments)    Body glue, turns skin blue   Sulfamethoxazole-Trimethoprim     UNSPECIFIED REACTION    Sulfasalazine Other (See Comments)   Sulfonamide Derivatives     UNSPECIFIED REACTION    Augmentin [Amoxicillin -Pot Clavulanate] Nausea And Vomiting    amox ok.    Hydrocodone Nausea And Vomiting    Severe vomiting   Oxycodone  Nausea And Vomiting    Severe vomiting   Prednisone  Other (See Comments)    insomnia    Vital Signs:  BP 105/69   Pulse 92   Resp 20   Ht 5\' 4"  (1.626 m)   Wt 241 lb (109.3 kg)   LMP 08/23/2017   SpO2 95%   BMI 41.37 kg/m    Neurological  Exam: MENTAL STATUS including orientation to time, place, person, recent and remote memory, attention span and concentration, language, and fund of knowledge is normal.  Speech is not dysarthric.  CRANIAL NERVES:   Pupils equal round and reactive to light.  Normal conjugate, extra-ocular eye movements in all directions of gaze.  No ptosis .  Face is symmetric.   MOTOR:  Motor strength is 5/5 in all extremities.  No atrophy, fasciculations or abnormal movements.  No pronator drift.  Tone is normal.    COORDINATION/GAIT:   Gait is slow, narrow based and stable.   Data: n/a     Thank you for allowing me to participate in patient's care.  If I can answer any additional questions, I would be pleased to do so.    Sincerely,    Arne Schlender K. Lydia Sams, DO

## 2024-01-19 ENCOUNTER — Other Ambulatory Visit: Payer: Self-pay | Admitting: Family Medicine

## 2024-01-20 ENCOUNTER — Other Ambulatory Visit: Payer: Self-pay | Admitting: Family Medicine

## 2024-01-27 DIAGNOSIS — R1013 Epigastric pain: Secondary | ICD-10-CM | POA: Diagnosis not present

## 2024-01-27 DIAGNOSIS — G8929 Other chronic pain: Secondary | ICD-10-CM | POA: Diagnosis not present

## 2024-01-27 DIAGNOSIS — K7689 Other specified diseases of liver: Secondary | ICD-10-CM | POA: Diagnosis not present

## 2024-01-28 ENCOUNTER — Encounter: Payer: Self-pay | Admitting: *Deleted

## 2024-01-28 ENCOUNTER — Other Ambulatory Visit (HOSPITAL_COMMUNITY): Payer: Self-pay | Admitting: General Surgery

## 2024-01-28 ENCOUNTER — Other Ambulatory Visit: Payer: Self-pay | Admitting: General Surgery

## 2024-01-28 DIAGNOSIS — R1013 Epigastric pain: Secondary | ICD-10-CM

## 2024-01-28 DIAGNOSIS — K7689 Other specified diseases of liver: Secondary | ICD-10-CM

## 2024-01-28 DIAGNOSIS — G8929 Other chronic pain: Secondary | ICD-10-CM

## 2024-01-28 NOTE — Progress Notes (Unsigned)
 Marland Silvas, MD  [facility] Pounds [facility]  US  aspiration L hepatic cyst  [facility]

## 2024-01-30 ENCOUNTER — Ambulatory Visit (HOSPITAL_BASED_OUTPATIENT_CLINIC_OR_DEPARTMENT_OTHER)
Admission: RE | Admit: 2024-01-30 | Discharge: 2024-01-30 | Disposition: A | Discharge status: Home / Self Care | Source: Ambulatory Visit | Attending: General Surgery | Admitting: General Surgery

## 2024-01-30 DIAGNOSIS — R1013 Epigastric pain: Secondary | ICD-10-CM | POA: Diagnosis not present

## 2024-01-30 DIAGNOSIS — K7689 Other specified diseases of liver: Secondary | ICD-10-CM | POA: Insufficient documentation

## 2024-01-31 ENCOUNTER — Other Ambulatory Visit: Payer: Self-pay | Admitting: Family Medicine

## 2024-02-02 ENCOUNTER — Other Ambulatory Visit (HOSPITAL_COMMUNITY): Payer: Self-pay | Admitting: General Surgery

## 2024-02-02 DIAGNOSIS — G8929 Other chronic pain: Secondary | ICD-10-CM

## 2024-02-05 NOTE — Progress Notes (Signed)
 Patient for US  FNA Aspiration of liver cyst on Fri 02/06/24, I called and spoke with the patient on the phone and gave pre-procedure instructions. Pt was made aware to be here at 10a and check in at the [facility] registration desk. Pt stated understanding.  Called 02/05/24

## 2024-02-06 ENCOUNTER — Ambulatory Visit
Admission: RE | Admit: 2024-02-06 | Discharge: 2024-02-06 | Disposition: A | Discharge status: Home / Self Care | Source: Ambulatory Visit | Attending: General Surgery | Admitting: General Surgery

## 2024-02-06 DIAGNOSIS — K7689 Other specified diseases of liver: Secondary | ICD-10-CM | POA: Insufficient documentation

## 2024-02-06 DIAGNOSIS — N281 Cyst of kidney, acquired: Secondary | ICD-10-CM | POA: Diagnosis not present

## 2024-02-06 MED ORDER — LIDOCAINE HCL (PF) 1 % IJ SOLN
20.0000 mL | Freq: Once | INTRAMUSCULAR | Status: AC
  Administered 2024-02-06: 20 mL via INTRADERMAL
  Filled 2024-02-06: qty 20

## 2024-02-06 MED ORDER — IBUPROFEN 600 MG PO TABS
600.0000 mg | ORAL_TABLET | Freq: Once | ORAL | Status: DC
  Filled 2024-02-06 (×2): qty 1

## 2024-02-06 MED ORDER — ACETAMINOPHEN 325 MG PO TABS
650.0000 mg | ORAL_TABLET | Freq: Once | ORAL | Status: AC
  Administered 2024-02-06: 650 mg via ORAL

## 2024-02-06 MED ORDER — ACETAMINOPHEN 325 MG PO TABS
ORAL_TABLET | ORAL | Status: AC
  Filled 2024-02-06: qty 2

## 2024-02-06 NOTE — Progress Notes (Signed)
 Was asked to recover patient from ultrasound procedure.  Vitals remained stable, minimal pain at insertion site (MD aware). Patient discharged to home with husband.

## 2024-02-06 NOTE — Progress Notes (Signed)
 Patient remains clinically stable post US  liver cyst aspiration of 220 clear,slightly yellow tinged fluid. Tolerated well with local per DR Philip. To specials for 2 hour recovery. Denies complaints of pain post procedure. Report given to [place] RN post procedure/specials/11

## 2024-02-06 NOTE — Procedures (Signed)
 Interventional Radiology Procedure:   Indications: Symptomatic liver cyst  Procedure: US  guided aspiration of hepatic cyst  Findings: Yueh catheter placed in cyst and aspirated 210 ml of clear fluid.  Cyst was decompressed after aspiration.   Complications: None     EBL: Minimal  Plan: Bedrest 2 hours, then discharge to home.   Izreal Kock R. Philip, MD  Pager: [phone]

## 2024-02-09 ENCOUNTER — Other Ambulatory Visit: Payer: Self-pay | Admitting: Family Medicine

## 2024-02-12 ENCOUNTER — Other Ambulatory Visit: Payer: Self-pay | Admitting: Family Medicine

## 2024-02-13 ENCOUNTER — Other Ambulatory Visit: Payer: Self-pay | Admitting: Family Medicine

## 2024-02-16 ENCOUNTER — Encounter: Payer: Self-pay | Admitting: General Surgery

## 2024-02-16 ENCOUNTER — Other Ambulatory Visit: Payer: Self-pay | Admitting: General Surgery

## 2024-02-16 DIAGNOSIS — R222 Localized swelling, mass and lump, trunk: Secondary | ICD-10-CM | POA: Diagnosis not present

## 2024-02-16 DIAGNOSIS — K7689 Other specified diseases of liver: Secondary | ICD-10-CM | POA: Diagnosis not present

## 2024-02-16 DIAGNOSIS — R21 Rash and other nonspecific skin eruption: Secondary | ICD-10-CM | POA: Diagnosis not present

## 2024-02-16 DIAGNOSIS — T7849XA Other allergy, initial encounter: Secondary | ICD-10-CM | POA: Diagnosis not present

## 2024-03-05 ENCOUNTER — Other Ambulatory Visit: Payer: Self-pay | Admitting: Family Medicine

## 2024-03-11 NOTE — Pre-Procedure Instructions (Signed)
 Surgical Instructions   Your procedure is scheduled on Thursday, August 7th. Report to [facility] Main Entrance A at 06:30 A.M., then check in with the Admitting office. Any questions or running late day of surgery: call [phone]  Questions prior to your surgery date: call [phone], Monday-Friday, 8am-4pm. If you experience any cold or flu symptoms such as cough, fever, chills, shortness of breath, etc. between now and your scheduled surgery, please notify us  at the above number.     Remember:  Do not eat after midnight the night before your surgery   You may drink clear liquids until 05:30 AM the morning of your surgery.   Clear liquids allowed are: Water, Non-Citrus Juices (without pulp), Carbonated Beverages, Clear Tea (no milk, honey, etc.), Black Coffee Only (NO MILK, CREAM OR POWDERED CREAMER of any kind), and Gatorade.    Take these medicines the morning of surgery with A SIP OF WATER  ezetimibe  (ZETIA )  pantoprazole  (PROTONIX )  topiramate  (TOPAMAX )  venlafaxine  XR (EFFEXOR -XR)    May take these medicines IF NEEDED: Albuterol  Sulfate (PROAIR  RESPICLICK)- bring inhaler with you on day of surgery Albuterol -Budesonide (AIRSUPRA )  clonazePAM  (KLONOPIN )  Rimegepant Sulfate (NURTEC)  fluticasone  (FLONASE )  hydrOXYzine  (ATARAX )  ipratropium (ATROVENT )    Follow your surgeon's instructions on when to stop Aspirin .  If no instructions were given by your surgeon then you will need to call the office to get those instructions.    One week prior to surgery, STOP taking any Aleve, Naproxen, Ibuprofen , Motrin , Advil , Goody's, BC's, all herbal medications, fish oil, and non-prescription vitamins.                     Do NOT Smoke (Tobacco/Vaping) for 24 hours prior to your procedure.  If you use a CPAP at night, you may bring your mask/headgear for your overnight stay.   You will be asked to remove any contacts, glasses, piercing's, hearing aid's, dentures/partials prior  to surgery. Please bring cases for these items if needed.    Patients discharged the day of surgery will not be allowed to drive home, and someone needs to stay with them for 24 hours.  SURGICAL WAITING ROOM VISITATION Patients may have no more than 2 support people in the waiting area - these visitors may rotate.   Pre-op nurse will coordinate an appropriate time for 1 ADULT support person, who may not rotate, to accompany patient in pre-op.  Children under the age of 45 must have an adult with them who is not the patient and must remain in the main waiting area with an adult.  If the patient needs to stay at the hospital during part of their recovery, the visitor guidelines for inpatient rooms apply.  Please refer to the [facility] website for the visitor guidelines for any additional information.   If you received a COVID test during your pre-op visit  it is requested that you wear a mask when out in public, stay away from anyone that may not be feeling well and notify your surgeon if you develop symptoms. If you have been in contact with anyone that has tested positive in the last 10 days please notify you surgeon.      Pre-operative Bathing Instructions    Special instructions:   [facility]- Preparing For Surgery   Please follow these instructions carefully.   Shower the NIGHT BEFORE SURGERY and the MORNING OF SURGERY with DIAL Soap.   Pat yourself dry with a CLEAN TOWEL.  Wear CLEAN PAJAMAS to bed the night before surgery  Place CLEAN SHEETS on your bed the night of your first shower and DO NOT SLEEP WITH PETS.   Additional instructions for the day of surgery: DO NOT APPLY any lotions, deodorants, cologne, or perfumes.   Do not wear jewelry or makeup Do not wear nail polish, gel polish, artificial nails, or any other type of covering on natural nails (fingers and toes) Do not bring valuables to the hospital. [facility] is not responsible for valuables/personal  belongings. Put on clean/comfortable clothes.  Please brush your teeth.  Ask your nurse before applying any prescription medications to the skin.

## 2024-03-12 ENCOUNTER — Encounter (HOSPITAL_COMMUNITY): Payer: Self-pay

## 2024-03-12 ENCOUNTER — Encounter (HOSPITAL_COMMUNITY)
Admission: RE | Admit: 2024-03-12 | Discharge: 2024-03-12 | Disposition: A | Discharge status: Home / Self Care | Source: Ambulatory Visit | Attending: General Surgery | Admitting: General Surgery

## 2024-03-12 ENCOUNTER — Other Ambulatory Visit: Payer: Self-pay

## 2024-03-12 VITALS — BP 131/83 | HR 68 | Temp 97.8°F | Resp 17 | Ht 64.5 in | Wt 241.4 lb

## 2024-03-12 DIAGNOSIS — Z01818 Encounter for other preprocedural examination: Secondary | ICD-10-CM | POA: Diagnosis not present

## 2024-03-12 DIAGNOSIS — K7689 Other specified diseases of liver: Secondary | ICD-10-CM | POA: Insufficient documentation

## 2024-03-12 DIAGNOSIS — R001 Bradycardia, unspecified: Secondary | ICD-10-CM | POA: Insufficient documentation

## 2024-03-12 DIAGNOSIS — I10 Essential (primary) hypertension: Secondary | ICD-10-CM | POA: Insufficient documentation

## 2024-03-12 HISTORY — DX: Atherosclerotic heart disease of native coronary artery without angina pectoris: I25.10

## 2024-03-12 HISTORY — DX: Other specified diseases of liver: K76.89

## 2024-03-12 HISTORY — DX: Sleep apnea, unspecified: G47.30

## 2024-03-12 HISTORY — DX: Peripheral vascular disease, unspecified: I73.9

## 2024-03-12 LAB — TYPE AND SCREEN
ABO/RH(D): O NEG
Antibody Screen: NEGATIVE

## 2024-03-12 LAB — CBC WITH DIFFERENTIAL/PLATELET
Abs Immature Granulocytes: 0.02 K/uL (ref 0.00–0.07)
Basophils Absolute: 0.1 K/uL (ref 0.0–0.1)
Basophils Relative: 1 %
Eosinophils Absolute: 0.2 K/uL (ref 0.0–0.5)
Eosinophils Relative: 3 %
HCT: 47.7 % — ABNORMAL HIGH (ref 36.0–46.0)
Hemoglobin: 15.5 g/dL — ABNORMAL HIGH (ref 12.0–15.0)
Immature Granulocytes: 0 %
Lymphocytes Relative: 33 %
Lymphs Abs: 2.8 K/uL (ref 0.7–4.0)
MCH: 29.9 pg (ref 26.0–34.0)
MCHC: 32.5 g/dL (ref 30.0–36.0)
MCV: 91.9 fL (ref 80.0–100.0)
Monocytes Absolute: 0.6 K/uL (ref 0.1–1.0)
Monocytes Relative: 7 %
Neutro Abs: 4.9 K/uL (ref 1.7–7.7)
Neutrophils Relative %: 56 %
Platelets: 323 K/uL (ref 150–400)
RBC: 5.19 MIL/uL — ABNORMAL HIGH (ref 3.87–5.11)
RDW: 14.1 % (ref 11.5–15.5)
WBC: 8.6 K/uL (ref 4.0–10.5)
nRBC: 0 % (ref 0.0–0.2)

## 2024-03-12 LAB — COMPREHENSIVE METABOLIC PANEL WITH GFR
ALT: 69 U/L — ABNORMAL HIGH (ref 0–44)
AST: 54 U/L — ABNORMAL HIGH (ref 15–41)
Albumin: 4.1 g/dL (ref 3.5–5.0)
Alkaline Phosphatase: 89 U/L (ref 38–126)
Anion gap: 11 (ref 5–15)
BUN: 8 mg/dL (ref 6–20)
CO2: 22 mmol/L (ref 22–32)
Calcium: 9.4 mg/dL (ref 8.9–10.3)
Chloride: 107 mmol/L (ref 98–111)
Creatinine, Ser: 0.91 mg/dL (ref 0.44–1.00)
GFR, Estimated: >60 mL/min (ref >60)
Glucose, Bld: 92 mg/dL (ref 70–99)
Potassium: 3.7 mmol/L (ref 3.5–5.1)
Sodium: 140 mmol/L (ref 135–145)
Total Bilirubin: 0.6 mg/dL (ref 0.0–1.2)
Total Protein: 7.8 g/dL (ref 6.5–8.1)

## 2024-03-12 LAB — PROTIME-INR
INR: 1 (ref 0.8–1.2)
Prothrombin Time: 13.9 s (ref 11.4–15.2)

## 2024-03-12 NOTE — Progress Notes (Signed)
 PCP - Dr. Charlies Golds Cardiologist - Dr. Annabella Scarce  PPM/ICD - denies   Chest x-ray - 09/15/23 EKG - 03/12/24 Stress Test - denies ECHO - 03/14/22 Cardiac Cath - denies  Sleep Study - OSA+ CPAP - nightly  DM- denies  Last dose of GLP1 agonist-  n/a   Blood Thinner Instructions: n/a Aspirin  Instructions: pt has not taken aspirin  since 6/23 (stopped before prior surgery)  ERAS Protcol - clears until 0530   COVID TEST- n/a   Anesthesia review: yes, cardiac hx. Pt has not seen cardiology since 07/2022. She stated that she thought she had seen Dr. Scarce since then but must not have.  Patient denies shortness of breath, fever, cough and chest pain at PAT appointment   All instructions explained to the patient, with a verbal understanding of the material. Patient agrees to go over the instructions while at home for a better understanding.  The opportunity to ask questions was provided.

## 2024-03-16 NOTE — H&P (Signed)
 PROVIDER: JINA CLAIR NEPHEW, MD Patient Care Team: Catherine Charlies LABOR, DO as PCP - General (Family Medicine) NEPHEW JINA CLAIR, MD as Consulting Provider (Surgical Oncology) Jewell, Jolene Rose, MD (Dermatology) Amadeo Windell SAILOR, MD (Hematology and Oncology)  MRN: [id number] DOB: [birth year]   Chief Complaint: Follow-up ( liver cyst/)   History of Present Illness: Shannon Quinn is a 53 y.o. female who is seen today for follow-up of liver cyst..  Initial history:   Patient was referred for abdominal pain 01/2024. Patient has had an extremely stressful year with her sister's death from metastatic lung cancer and the loss of both of her 55 year old Elden Durand dogs. She has been having abdominal pain at least for 9 months to a year and this has been attributed to stress. She also has had melanoma. She describes the pain as like a sharp burning pressure. It originally was less severe but now happens when she eats or drinks anything. It gets worse as the day goes on and she says it feels like a weight pulling down on her stomach. It is worse when she is sitting up. It also can occur when she is not eating. She did start on Carafate  and Protonix  which has cut the pain down in half. She has not ever had an endoscopy.  She has had a liver cyst noted in the past on PET scans for melanoma. She recently got a CT for abdominal pain and the liver cyst was noted to be larger and in the left lateral segment. At her last visit to primary care, she was quite tender over this upon examination. She is referred to me to assess this.  Interval history:   I got an ultrasound of the abdomen to evaluate for gallstones as I could not find any documentation that she had ever had this. This was negative. She also underwent ultrasound cyst aspiration. She felt progressively better as the cyst was aspirated and by the end after 210 mL of been aspirated, she had nearly 100% resolution of her symptoms. She said she  could breathe better, she did not feel as much reflux, she could eat much better, and she did not feel the knot in her upper abdomen.  She did however develop a dramatic rash on her upper abdomen.  Review of Systems: A complete review of systems was obtained from the patient. I have reviewed this information and discussed as appropriate with the patient. See HPI as well for other ROS.  Review of Systems  All other systems reviewed and are negative.   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 Cannot use surgical glue.  Pt states Betadine is safe and without reaction.  Duraprep [Iodine Povacry-Iso Alcohol] Rash  Neomycin Other (See Comments)  UNSPECIFIED REACTION   Other Other (See Comments)  Body glue, turns skin blue  Sulfa (Sulfonamide Antibiotics) Other (See Comments)  Sulfamethoxazole-Trimethoprim Other (See Comments)  UNSPECIFIED REACTION  Sulfasalazine Other (See Comments)  Amoxicillin -Pot Clavulanate Nausea And Vomiting  amox ok.  amox ok.   Hydrocodone Nausea And Vomiting  Severe vomiting  Oxycodone  Nausea And Vomiting  Severe vomiting  Prednisone  Other (See Comments)  insomnia insomnia   Current Outpatient Medications on File Prior to Visit  Medication Sig Dispense Refill  amitriptyline  (ELAVIL ) 10 MG tablet Take 10 mg by mouth at bedtime  docosahexaenoic acid-epa  120-180 mg Cap Take by mouth  hydroCHLOROthiazide  (HYDRODIURIL ) 25 MG tablet hydrochlorothiazide  25 mg tablet  hydrOXYzine  HCL (ATARAX ) 10 MG tablet Take by mouth  nitrofurantoin , macrocrystal-monohydrate, (MACROBID ) 100 MG capsule Take 100 mg by mouth 2 (two) times daily  ondansetron  (ZOFRAN ) 4 MG tablet TAKE 1-2 TABLETS BY MOUTH EVERY 8 HOURS AS NEEDED FOR NAUSEA OR VOMITING.  promethazine  (PHENERGAN ) 25 MG suppository Place rectally  rizatriptan  (MAXALT ) 5 MG tablet Take by mouth  topiramate  (TOPAMAX ) 50 MG tablet Take 50 mg by mouth once daily  venlafaxine  (EFFEXOR -XR) 75 MG XR capsule venlafaxine  ER 75 mg capsule,extended release 24 hr TAKE 1 CAPSULE BY MOUTH EVERY DAY  gabapentin  (NEURONTIN ) 100 MG capsule Take 1 capsule (100 mg total) by mouth 3 (three) times daily 90 capsule 11   No current facility-administered medications on file prior to visit.   Family History  Problem Relation Age of Onset  Stroke Mother  Hyperlipidemia (Elevated cholesterol) Mother  Stroke Father  Skin cancer Father  High blood pressure (Hypertension) Father  Hyperlipidemia (Elevated cholesterol) Father  Heart valve disease Father  Diabetes Father  Obesity Sister  Heart valve disease Sister    Social History   Tobacco Use  Smoking Status Never  Smokeless Tobacco Never    Social History   Socioeconomic History  Marital status: Married  Tobacco Use  Smoking status: Never  Smokeless tobacco: Never  Vaping Use  Vaping status: Never Used  Substance and Sexual Activity  Alcohol use: Never  Drug use: Never   Social Drivers of Health     Housing Stability Vital Sign  Homeless in the Last Year: No   Objective:   Vitals:   PainSc: 0-No pain   There is no height or weight on file to calculate BMI.  Head: Normocephalic and atraumatic.  Eyes: Conjunctivae are normal. Pupils are equal, round, and reactive to light. No scleral icterus.  Neck: Normal range of motion. Neck supple. No  tracheal deviation present. No thyromegaly present.  Resp: No respiratory distress, normal effort. Abd: Abdomen is soft, non distended and non tender. No masses are palpable. There is no rebound and no guarding. Dramatic rash over abdomen. This is a maculopapular rash that is erythematous in nature. Neurological: Alert and oriented to person, place, and time. Coordination normal.  Skin: Skin is warm and dry. No rash noted. No diaphoretic. No erythema. No pallor.  Psychiatric: Normal mood and affect. Normal behavior. Judgment and thought content normal.   Labs, Imaging and Diagnostic Testing:  N/a    Assessment and Plan:   Diagnoses and all orders for this visit:  Rash due to allergy - betamethasone dipropionate (DIPROSONE) 0.05 % cream; Apply topically 2 (two) times daily  Mass on back  Liver cyst Patient did develop dramatic improvement in her symptoms with aspiration of the cyst. We will plan to do laparoscopic cyst unroofing. I discussed the procedure with the patient. I reviewed the risk which are primarily related to bleeding and bile leak. I did also discussed the risk of damage to adjacent structures, heart or lung  complications, blood clot, risk of needing to go back to the operating room, risk of cyst recurrence, risk of one of her other cyst developing into something bigger. I did discuss that we are only going to address the large cyst on the left lateral segment as the other ones are small and not causing the symptoms.  I am prescribing betamethasone ointment for the rash on her abdominal wall.  This rash needs to be healed prior to her surgery.  No follow-ups on file.  JINA CLAIR NEPHEW, MD

## 2024-03-18 ENCOUNTER — Ambulatory Visit (HOSPITAL_COMMUNITY): Payer: Self-pay | Admitting: Physician Assistant

## 2024-03-18 ENCOUNTER — Other Ambulatory Visit: Payer: Self-pay

## 2024-03-18 ENCOUNTER — Encounter: Payer: Self-pay | Admitting: General Surgery

## 2024-03-18 ENCOUNTER — Ambulatory Visit (HOSPITAL_COMMUNITY): Admitting: Anesthesiology

## 2024-03-18 ENCOUNTER — Encounter (HOSPITAL_COMMUNITY): Payer: Self-pay | Admitting: General Surgery

## 2024-03-18 ENCOUNTER — Ambulatory Visit (HOSPITAL_COMMUNITY)
Admission: RE | Admit: 2024-03-18 | Discharge: 2024-03-18 | Disposition: A | Discharge status: Home / Self Care | Attending: General Surgery | Admitting: General Surgery

## 2024-03-18 ENCOUNTER — Encounter (HOSPITAL_COMMUNITY)
Admission: RE | Disposition: A | Discharge status: Home / Self Care | Payer: Self-pay | Source: Home / Self Care | Attending: General Surgery

## 2024-03-18 DIAGNOSIS — I10 Essential (primary) hypertension: Secondary | ICD-10-CM | POA: Diagnosis not present

## 2024-03-18 DIAGNOSIS — I2510 Atherosclerotic heart disease of native coronary artery without angina pectoris: Secondary | ICD-10-CM | POA: Diagnosis not present

## 2024-03-18 DIAGNOSIS — Z6841 Body Mass Index (BMI) 40.0 and over, adult: Secondary | ICD-10-CM | POA: Insufficient documentation

## 2024-03-18 DIAGNOSIS — K7689 Other specified diseases of liver: Secondary | ICD-10-CM | POA: Diagnosis not present

## 2024-03-18 DIAGNOSIS — Z85820 Personal history of malignant melanoma of skin: Secondary | ICD-10-CM | POA: Diagnosis not present

## 2024-03-18 DIAGNOSIS — R1013 Epigastric pain: Secondary | ICD-10-CM | POA: Diagnosis not present

## 2024-03-18 DIAGNOSIS — E66813 Obesity, class 3: Secondary | ICD-10-CM | POA: Insufficient documentation

## 2024-03-18 DIAGNOSIS — R21 Rash and other nonspecific skin eruption: Secondary | ICD-10-CM | POA: Insufficient documentation

## 2024-03-18 DIAGNOSIS — E785 Hyperlipidemia, unspecified: Secondary | ICD-10-CM | POA: Diagnosis not present

## 2024-03-18 DIAGNOSIS — G4730 Sleep apnea, unspecified: Secondary | ICD-10-CM | POA: Insufficient documentation

## 2024-03-18 DIAGNOSIS — G8929 Other chronic pain: Secondary | ICD-10-CM | POA: Diagnosis not present

## 2024-03-18 HISTORY — DX: Prediabetes: R73.03

## 2024-03-18 HISTORY — PX: LAPAROSCOPIC LIVER CYST REMOVAL: SHX5900

## 2024-03-18 LAB — ABO/RH: ABO/RH(D): O NEG

## 2024-03-18 SURGERY — EXCISION, CYST, LIVER, LAPAROSCOPIC
Anesthesia: General | Site: Abdomen

## 2024-03-18 MED ORDER — PROCHLORPERAZINE MALEATE 5 MG PO TABS: 5.0000 mg | ORAL_TABLET | Freq: Four times a day (QID) | ORAL | 1 refills | Status: AC | PRN

## 2024-03-18 MED ORDER — SCOPOLAMINE 1 MG/3DAYS TD PT72: 1.0000 | MEDICATED_PATCH | TRANSDERMAL | 12 refills | Status: AC

## 2024-03-18 MED ORDER — PROPOFOL 10 MG/ML IV BOLUS
INTRAVENOUS | Status: DC | PRN
  Administered 2024-03-18: 200 mg via INTRAVENOUS

## 2024-03-18 MED ORDER — PROPOFOL 10 MG/ML IV BOLUS
INTRAVENOUS | Status: AC
  Filled 2024-03-18: qty 20

## 2024-03-18 MED ORDER — AMISULPRIDE (ANTIEMETIC) 5 MG/2ML IV SOLN
INTRAVENOUS | Status: AC
  Filled 2024-03-18: qty 2

## 2024-03-18 MED ORDER — ACETAMINOPHEN 500 MG PO TABS: 1000.0000 mg | ORAL_TABLET | ORAL | Status: AC

## 2024-03-18 MED ORDER — DEXAMETHASONE SODIUM PHOSPHATE 10 MG/ML IJ SOLN
INTRAMUSCULAR | Status: AC
  Filled 2024-03-18: qty 1

## 2024-03-18 MED ORDER — AMISULPRIDE (ANTIEMETIC) 5 MG/2ML IV SOLN: 10.0000 mg | Freq: Once | INTRAVENOUS | Status: DC | PRN

## 2024-03-18 MED ORDER — MIDAZOLAM HCL 2 MG/2ML IJ SOLN
INTRAMUSCULAR | Status: DC | PRN
  Administered 2024-03-18: 2 mg via INTRAVENOUS

## 2024-03-18 MED ORDER — ONDANSETRON HCL 4 MG/2ML IJ SOLN
INTRAMUSCULAR | Status: DC | PRN
  Administered 2024-03-18: 4 mg via INTRAVENOUS

## 2024-03-18 MED ORDER — BUPIVACAINE-EPINEPHRINE (PF) 0.25% -1:200000 IJ SOLN
INTRAMUSCULAR | Status: AC
Start: 2024-03-18 — End: 2024-03-18
  Filled 2024-03-18: qty 30

## 2024-03-18 MED ORDER — ONDANSETRON HCL 4 MG/2ML IJ SOLN
INTRAMUSCULAR | Status: AC
  Filled 2024-03-18: qty 2

## 2024-03-18 MED ORDER — CHLORHEXIDINE GLUCONATE 0.12 % MT SOLN
OROMUCOSAL | Status: DC
Start: 2024-03-18 — End: 2024-03-18
  Filled 2024-03-18: qty 15

## 2024-03-18 MED ORDER — FENTANYL CITRATE (PF) 250 MCG/5ML IJ SOLN
INTRAMUSCULAR | Status: AC
  Filled 2024-03-18: qty 5

## 2024-03-18 MED ORDER — KETOROLAC TROMETHAMINE 30 MG/ML IJ SOLN
INTRAMUSCULAR | Status: AC
  Filled 2024-03-18: qty 1

## 2024-03-18 MED ORDER — TRAMADOL HCL 50 MG PO TABS: 50.0000 mg | ORAL_TABLET | Freq: Four times a day (QID) | ORAL | 0 refills | Status: AC | PRN

## 2024-03-18 MED ORDER — ACETAMINOPHEN 500 MG PO TABS
ORAL_TABLET | ORAL | Status: AC
  Administered 2024-03-18: 1000 mg via ORAL
  Filled 2024-03-18: qty 2

## 2024-03-18 MED ORDER — FENTANYL CITRATE (PF) 100 MCG/2ML IJ SOLN
25.0000 ug | INTRAMUSCULAR | Status: DC | PRN
  Administered 2024-03-18 (×3): 50 ug via INTRAVENOUS

## 2024-03-18 MED ORDER — LIDOCAINE HCL 1 % IJ SOLN
INTRAMUSCULAR | Status: AC
  Filled 2024-03-18: qty 20

## 2024-03-18 MED ORDER — ROCURONIUM BROMIDE 10 MG/ML (PF) SYRINGE
PREFILLED_SYRINGE | INTRAVENOUS | Status: AC
  Filled 2024-03-18: qty 10

## 2024-03-18 MED ORDER — PROPOFOL 500 MG/50ML IV EMUL
INTRAVENOUS | Status: DC | PRN
  Administered 2024-03-18: 150 ug/kg/min via INTRAVENOUS

## 2024-03-18 MED ORDER — FENTANYL CITRATE (PF) 100 MCG/2ML IJ SOLN
INTRAMUSCULAR | Status: AC
Start: 2024-03-18 — End: 2024-03-18
  Filled 2024-03-18: qty 2

## 2024-03-18 MED ORDER — SODIUM CHLORIDE 0.9 % IR SOLN
Status: DC | PRN
  Administered 2024-03-18: 1000 mL

## 2024-03-18 MED ORDER — MIDAZOLAM HCL 2 MG/2ML IJ SOLN
INTRAMUSCULAR | Status: AC
  Filled 2024-03-18: qty 2

## 2024-03-18 MED ORDER — ONDANSETRON 8 MG PO TBDP: 4.0000 mg | ORAL_TABLET | Freq: Three times a day (TID) | ORAL | 0 refills | Status: AC | PRN

## 2024-03-18 MED ORDER — OXYCODONE HCL 5 MG PO TABS: 2.5000 mg | ORAL_TABLET | Freq: Four times a day (QID) | ORAL | 0 refills | Status: AC | PRN

## 2024-03-18 MED ORDER — LIDOCAINE 2% (20 MG/ML) 5 ML SYRINGE
INTRAMUSCULAR | Status: AC
  Filled 2024-03-18: qty 5

## 2024-03-18 MED ORDER — CIPROFLOXACIN IN D5W 400 MG/200ML IV SOLN
INTRAVENOUS | Status: AC
  Filled 2024-03-18: qty 200

## 2024-03-18 MED ORDER — AMISULPRIDE (ANTIEMETIC) 5 MG/2ML IV SOLN
INTRAVENOUS | Status: DC | PRN
  Administered 2024-03-18: 5 mg via INTRAVENOUS

## 2024-03-18 MED ORDER — ROCURONIUM BROMIDE 10 MG/ML (PF) SYRINGE
PREFILLED_SYRINGE | INTRAVENOUS | Status: DC | PRN
  Administered 2024-03-18: 10 mg via INTRAVENOUS
  Administered 2024-03-18: 40 mg via INTRAVENOUS
  Administered 2024-03-18: 30 mg via INTRAVENOUS

## 2024-03-18 MED ORDER — LIDOCAINE 2% (20 MG/ML) 5 ML SYRINGE
INTRAMUSCULAR | Status: DC | PRN
  Administered 2024-03-18: 60 mg via INTRAVENOUS

## 2024-03-18 MED ORDER — DEXAMETHASONE SODIUM PHOSPHATE 10 MG/ML IJ SOLN
INTRAMUSCULAR | Status: DC | PRN
  Administered 2024-03-18: 10 mg via INTRAVENOUS

## 2024-03-18 MED ORDER — SCOPOLAMINE 1 MG/3DAYS TD PT72
1.0000 | MEDICATED_PATCH | TRANSDERMAL | Status: DC
  Administered 2024-03-18: 1.5 mg via TRANSDERMAL
  Filled 2024-03-18: qty 1

## 2024-03-18 MED ORDER — 0.9 % SODIUM CHLORIDE (POUR BTL) OPTIME
TOPICAL | Status: DC | PRN
  Administered 2024-03-18: 1000 mL

## 2024-03-18 MED ORDER — HYDROMORPHONE HCL 1 MG/ML IJ SOLN
0.2500 mg | INTRAMUSCULAR | Status: DC | PRN
  Administered 2024-03-18 (×2): 0.25 mg via INTRAVENOUS

## 2024-03-18 MED ORDER — FENTANYL CITRATE (PF) 250 MCG/5ML IJ SOLN
INTRAMUSCULAR | Status: DC | PRN
  Administered 2024-03-18: 100 ug via INTRAVENOUS

## 2024-03-18 MED ORDER — PHENYLEPHRINE HCL-NACL 20-0.9 MG/250ML-% IV SOLN
INTRAVENOUS | Status: DC | PRN
  Administered 2024-03-18: 40 ug/min via INTRAVENOUS

## 2024-03-18 MED ORDER — FENTANYL CITRATE (PF) 100 MCG/2ML IJ SOLN
INTRAMUSCULAR | Status: AC
  Filled 2024-03-18: qty 2

## 2024-03-18 MED ORDER — CIPROFLOXACIN IN D5W 400 MG/200ML IV SOLN
400.0000 mg | INTRAVENOUS | Status: AC
  Administered 2024-03-18: 400 mg via INTRAVENOUS

## 2024-03-18 MED ORDER — KETOROLAC TROMETHAMINE 30 MG/ML IJ SOLN
30.0000 mg | Freq: Once | INTRAMUSCULAR | Status: AC | PRN
  Administered 2024-03-18: 30 mg via INTRAVENOUS

## 2024-03-18 MED ORDER — LIDOCAINE HCL 1 % IJ SOLN
INTRAMUSCULAR | Status: DC | PRN
  Administered 2024-03-18: 12 mL via INTRAMUSCULAR

## 2024-03-18 MED ORDER — PHENYLEPHRINE 80 MCG/ML (10ML) SYRINGE FOR IV PUSH (FOR BLOOD PRESSURE SUPPORT)
PREFILLED_SYRINGE | INTRAVENOUS | Status: DC | PRN
  Administered 2024-03-18: 240 ug via INTRAVENOUS
  Administered 2024-03-18: 80 ug via INTRAVENOUS

## 2024-03-18 MED ORDER — LACTATED RINGERS IV SOLN: INTRAVENOUS | Status: DC

## 2024-03-18 MED ORDER — SUGAMMADEX SODIUM 200 MG/2ML IV SOLN
INTRAVENOUS | Status: DC | PRN
  Administered 2024-03-18: 200 mg via INTRAVENOUS

## 2024-03-18 MED ORDER — ORAL CARE MOUTH RINSE
15.0000 mL | Freq: Once | OROMUCOSAL | Status: AC
  Administered 2024-03-18: 15 mL via OROMUCOSAL

## 2024-03-18 MED ORDER — HYDROMORPHONE HCL 1 MG/ML IJ SOLN
INTRAMUSCULAR | Status: AC
  Filled 2024-03-18: qty 1

## 2024-03-18 SURGICAL SUPPLY — 79 items
APPLICATOR VISTASEAL 35 (MISCELLANEOUS) IMPLANT
BAG COUNTER SPONGE SURGICOUNT (BAG) ×2 IMPLANT
BLADE CLIPPER SURG (BLADE) IMPLANT
CANISTER SUCTION 3000ML PPV (SUCTIONS) ×1 IMPLANT
CHLORAPREP W/TINT 26 (MISCELLANEOUS) ×2 IMPLANT
CLIP LIGATING HEM O LOK PURPLE (MISCELLANEOUS) ×2 IMPLANT
CLIP LIGATING HEMO O LOK GREEN (MISCELLANEOUS) ×2 IMPLANT
CLIP LIGATING HEMOLOK MED (MISCELLANEOUS) ×2 IMPLANT
CLIP TI LARGE 6 (CLIP) ×2 IMPLANT
CLIP TI MEDIUM 6 (CLIP) ×2 IMPLANT
CLIP TI WIDE RED SMALL 24 (CLIP) ×2 IMPLANT
COVER SURGICAL LIGHT HANDLE (MISCELLANEOUS) ×3 IMPLANT
DOPPLER CAUTERY SUPPRESSOR (INSTRUMENTS) ×3 IMPLANT
DRAIN CHANNEL 19F RND (DRAIN) IMPLANT
DRAIN PENROSE 0.5X18 (DRAIN) IMPLANT
DRAPE WARM FLUID 44X44 (DRAPES) ×2 IMPLANT
DRSG COVADERM 4X10 (GAUZE/BANDAGES/DRESSINGS) IMPLANT
DRSG COVADERM 4X14 (GAUZE/BANDAGES/DRESSINGS) IMPLANT
ELECT BLADE 6.5 EXT (BLADE) IMPLANT
ELECT CAUTERY BLADE 6.4 (BLADE) IMPLANT
ELECT PAD DSPR THERM+ ADLT (MISCELLANEOUS) ×3 IMPLANT
ELECTRODE REM PT RTRN 9FT ADLT (ELECTROSURGICAL) ×3 IMPLANT
EVACUATOR SILICONE 100CC (DRAIN) IMPLANT
GAUZE SPONGE 4X4 12PLY STRL (GAUZE/BANDAGES/DRESSINGS) ×1 IMPLANT
GLOVE BIO SURGEON STRL SZ 6 (GLOVE) ×3 IMPLANT
GLOVE INDICATOR 6.5 STRL GRN (GLOVE) ×3 IMPLANT
GOWN STRL REUS W/ TWL LRG LVL3 (GOWN DISPOSABLE) ×9 IMPLANT
GOWN STRL REUS W/ TWL XL LVL3 (GOWN DISPOSABLE) ×3 IMPLANT
HEMOSTAT SNOW SURGICEL 2X4 (HEMOSTASIS) IMPLANT
IRRIGATION SUCT STRKRFLW 2 WTP (MISCELLANEOUS) ×1 IMPLANT
KIT BASIN OR (CUSTOM PROCEDURE TRAY) ×3 IMPLANT
KIT TURNOVER KIT B (KITS) ×3 IMPLANT
LHOOK LAP DISP 36CM (ELECTROSURGICAL) ×3 IMPLANT
LOOP VASCLR MAXI BLUE 18IN ST (MISCELLANEOUS) IMPLANT
LOOPS VASCLR MAXI BLUE 18IN ST (MISCELLANEOUS) IMPLANT
NS IRRIG 1000ML POUR BTL (IV SOLUTION) ×6 IMPLANT
PAD ARMBOARD POSITIONER FOAM (MISCELLANEOUS) ×6 IMPLANT
PENCIL BUTTON HOLSTER BLD 10FT (ELECTRODE) ×3 IMPLANT
PROBE LAPAROSCOPIC 5MM W/FTSWT (MISCELLANEOUS) ×1 IMPLANT
PROBE LASER ARGON (MISCELLANEOUS) ×2 IMPLANT
RELOAD STAPLE 60 2.6 WHT THN (STAPLE) IMPLANT
RELOAD STAPLE 60 3.6 BLU REG (STAPLE) IMPLANT
SCISSORS HARMONIC WAVE 18CM (INSTRUMENTS) ×2 IMPLANT
SET CANNULATION TOURNIQUET (MISCELLANEOUS) ×2 IMPLANT
SET TUBE SMOKE EVAC HIGH FLOW (TUBING) ×3 IMPLANT
SHEARS 1100 HARMONIC 36 (ELECTROSURGICAL) ×1 IMPLANT
SLEEVE Z-THREAD 5X100MM (TROCAR) ×6 IMPLANT
SPONGE T-LAP 18X18 ~~LOC~~+RFID (SPONGE) IMPLANT
STAPLE ECHEON FLEX 60 POW ENDO (STAPLE) IMPLANT
STAPLER SKIN PROX 35W (STAPLE) IMPLANT
STRIP CLOSURE SKIN 1/2X4 (GAUZE/BANDAGES/DRESSINGS) ×1 IMPLANT
SUT ETHILON 1 LR 30 (SUTURE) ×2 IMPLANT
SUT ETHILON 2 0 FS 18 (SUTURE) IMPLANT
SUT MNCRL AB 4-0 PS2 18 (SUTURE) ×4 IMPLANT
SUT PDS AB 1 TP1 96 (SUTURE) IMPLANT
SUT PDS II 0 TP-1 LOOPED 60 (SUTURE) IMPLANT
SUT PROLENE 3 0 SH DA (SUTURE) ×2 IMPLANT
SUT PROLENE 4-0 RB1 18X2 ARM (SUTURE) ×2 IMPLANT
SUT VIC AB 2-0 CT1 TAPERPNT 27 (SUTURE) ×2 IMPLANT
SUT VIC AB 2-0 SH 18 (SUTURE) ×2 IMPLANT
SUT VIC AB 3-0 SH 18 (SUTURE) ×2 IMPLANT
SUT VICRYL 0 UR6 27IN ABS (SUTURE) ×2 IMPLANT
SUT VICRYL AB 2 0 TIES (SUTURE) ×2 IMPLANT
SUT VICRYL AB 3 0 TIES (SUTURE) ×2 IMPLANT
SYSTEM BAG RETRIEVAL 10MM (BASKET) ×3 IMPLANT
SYSTEM LAPSCP GELPORT 120MM (MISCELLANEOUS) IMPLANT
TOWEL GREEN STERILE (TOWEL DISPOSABLE) ×3 IMPLANT
TOWEL GREEN STERILE FF (TOWEL DISPOSABLE) ×3 IMPLANT
TRAP SPECIMEN MUCUS 40CC (MISCELLANEOUS) ×1 IMPLANT
TRAY FOLEY MTR SLVR 16FR STAT (SET/KITS/TRAYS/PACK) ×2 IMPLANT
TRAY LAPAROSCOPIC MC (CUSTOM PROCEDURE TRAY) ×3 IMPLANT
TROCAR BALLN 12MMX100 BLUNT (TROCAR) IMPLANT
TROCAR Z THREAD OPTICAL 12X100 (TROCAR) ×1 IMPLANT
TROCAR Z-THREAD OPTICAL 5X100M (TROCAR) ×3 IMPLANT
TUBE CONNECTING 12X1/4 (SUCTIONS) IMPLANT
TUBING EVAC SMOKE HEATED PNEUM (TUBING) ×2 IMPLANT
WARMER LAPAROSCOPE (MISCELLANEOUS) ×3 IMPLANT
WATER STERILE IRR 1000ML POUR (IV SOLUTION) ×3 IMPLANT
YANKAUER SUCT BULB TIP NO VENT (SUCTIONS) IMPLANT

## 2024-03-18 NOTE — Progress Notes (Signed)
 This RN wasted 50 mcg of fentanyl  with Geographical information systems officer.

## 2024-03-18 NOTE — Transfer of Care (Signed)
 Immediate Anesthesia Transfer of Care Note  Patient: Shannon Quinn  Procedure(s) Performed: EXCISION, CYST, LIVER, LAPAROSCOPIC (Abdomen)  Patient Location: PACU  Anesthesia Type:General  Level of Consciousness: drowsy and patient cooperative  Airway & Oxygen Therapy: Patient Spontanous Breathing and Patient connected to face mask oxygen  Post-op Assessment: Report given to RN, Post -op Vital signs reviewed and stable, and Patient moving all extremities  Post vital signs: Reviewed and stable  Last Vitals:  Vitals Value Taken Time  BP 168/83 03/18/24 10:16  Temp 36.5 C 03/18/24 10:15  Pulse 66 03/18/24 10:22  Resp 17 03/18/24 10:22  SpO2 99 % 03/18/24 10:22  Vitals shown include unfiled device data.  Last Pain:  Vitals:   03/18/24 1021  TempSrc:   PainSc: 10-Worst pain ever         Complications: No notable events documented.

## 2024-03-18 NOTE — Interval H&P Note (Signed)
 History and Physical Interval Note:  03/18/2024 8:24 AM  Shannon Quinn  has presented today for surgery, with the diagnosis of LIVER CYST.  The various methods of treatment have been discussed with the patient and family. After consideration of risks, benefits and other options for treatment, the patient has consented to LAPAROSCOPIC LIVER CYST FENESTRATION (UNROOFING) as a surgical intervention.  The patient's history has been reviewed, patient examined, no change in status, stable for surgery.  I have reviewed the patient's chart and labs.  Questions were answered to the patient's satisfaction.     Jina Nephew

## 2024-03-18 NOTE — Anesthesia Postprocedure Evaluation (Signed)
 Anesthesia Post Note  Patient: Shannon Quinn  Procedure(s) Performed: EXCISION, CYST, LIVER, LAPAROSCOPIC (Abdomen)     Patient location during evaluation: PACU Anesthesia Type: General Level of consciousness: awake Pain management: pain level controlled Vital Signs Assessment: post-procedure vital signs reviewed and stable Respiratory status: spontaneous breathing, nonlabored ventilation and respiratory function stable Cardiovascular status: blood pressure returned to baseline and stable Postop Assessment: no apparent nausea or vomiting Anesthetic complications: no   No notable events documented.  Last Vitals:  Vitals:   03/18/24 1145 03/18/24 1200  BP: 124/82 126/78  Pulse: 72 68  Resp: 16 13  Temp:  36.6 C  SpO2: 99% 98%    Last Pain:  Vitals:   03/18/24 1148  TempSrc:   PainSc: 6                  Kaysea Raya P Shahab Polhamus

## 2024-03-18 NOTE — Anesthesia Preprocedure Evaluation (Addendum)
 Anesthesia Evaluation  Patient identified by MRN, date of birth, ID band Patient awake    Reviewed: Allergy & Precautions, NPO status , Patient's Chart, lab work & pertinent test results  History of Anesthesia Complications (+) PONV and history of anesthetic complications  Airway Mallampati: II  TM Distance: >3 FB Neck ROM: Full    Dental no notable dental hx.    Pulmonary sleep apnea and Continuous Positive Airway Pressure Ventilation    Pulmonary exam normal        Cardiovascular hypertension, Pt. on medications + CAD and + Peripheral Vascular Disease  Normal cardiovascular exam     Neuro/Psych  Headaches PSYCHIATRIC DISORDERS Anxiety Depression     Neuromuscular disease    GI/Hepatic negative GI ROS, Neg liver ROS,,,  Endo/Other    Class 3 obesity  Renal/GU negative Renal ROS     Musculoskeletal negative musculoskeletal ROS (+)    Abdominal  (+) + obese  Peds  Hematology negative hematology ROS (+)   Anesthesia Other Findings LIVER CYST  Reproductive/Obstetrics                              Anesthesia Physical Anesthesia Plan  ASA: 3  Anesthesia Plan: General   Post-op Pain Management:    Induction: Intravenous  PONV Risk Score and Plan: 4 or greater and Ondansetron , Dexamethasone , Midazolam , Scopolamine  patch - Pre-op, Treatment may vary due to age or medical condition, Amisulpride  and Propofol  infusion  Airway Management Planned: Oral ETT  Additional Equipment:   Intra-op Plan:   Post-operative Plan: Extubation in OR  Informed Consent: I have reviewed the patients History and Physical, chart, labs and discussed the procedure including the risks, benefits and alternatives for the proposed anesthesia with the patient or authorized representative who has indicated his/her understanding and acceptance.     Dental advisory given  Plan Discussed with: CRNA  Anesthesia  Plan Comments:          Anesthesia Quick Evaluation

## 2024-03-18 NOTE — Discharge Instructions (Addendum)
 [facility] Office Phone Number [phone]   POST OP INSTRUCTIONS  Always review your discharge instruction sheet given to you by the facility where your surgery was performed.  IF YOU HAVE DISABILITY OR FAMILY LEAVE FORMS, YOU MUST BRING THEM TO THE OFFICE FOR PROCESSING.  DO NOT GIVE THEM TO YOUR DOCTOR.  Take 2 tylenol  (acetominophen) three times a day for 3 days.  If you still have pain, add ibuprofen  with food in between if able to take this (if you have kidney issues or stomach issues, do not take ibuprofen ).  If both of those are not enough, add the narcotic pain pill.  If you find you are needing a lot of this overnight after surgery, call the next morning for a refill.   Take your usually prescribed medications unless otherwise directed If you need a refill on your pain medication, please contact your pharmacy.  They will contact our office to request authorization.  Prescriptions will not be filled after 5pm or on week-ends. You should eat very light the first 24 hours after surgery, such as soup, crackers, pudding, etc.  Resume your normal diet the day after surgery It is common to experience some constipation if taking pain medication after surgery.  Increasing fluid intake and taking a stool softener will usually help or prevent this problem from occurring.  A mild laxative (Milk of Magnesia or Miralax) should be taken according to package directions if there are no bowel movements after 48 hours. Remove gauze/tape later today if desired.  You may shower in 48 hours.  ACTIVITIES:  No strenuous activity or heavy lifting for 2 weeks. You may drive when you no longer are taking prescription pain medication, you can comfortably wear a seatbelt, and you can safely maneuver your car and apply brakes. RETURN TO WORK:  __________2 weeks_______________ Shannon Quinn should see your doctor in the office for a follow-up appointment approximately three-four weeks after your surgery.     WHEN TO CALL YOUR DOCTOR: Fever over 101.0 Nausea and/or vomiting. Extreme swelling or bruising. Continued bleeding from incision. Increased pain, redness, or drainage from the incision.  The clinic staff is available to answer your questions during regular business hours.  Please don't hesitate to call and ask to speak to one of the nurses for clinical concerns.  If you have a medical emergency, go to the nearest emergency room or call 911.  A surgeon from [facility] is always on call at the hospital.  For further questions, please visit [email or web address]

## 2024-03-18 NOTE — Anesthesia Procedure Notes (Signed)
 Procedure Name: Intubation Date/Time: 03/18/2024 8:54 AM  Performed by: Shlomo Tinnie SAILOR, RNPre-anesthesia Checklist: Patient identified, Emergency Drugs available, Suction available and Patient being monitored Patient Re-evaluated:Patient Re-evaluated prior to induction Oxygen Delivery Method: Circle System Utilized Preoxygenation: Pre-oxygenation with 100% oxygen Induction Type: IV induction Ventilation: Mask ventilation without difficulty Laryngoscope Size: Mac and 3 Grade View: Grade I Tube type: Oral Tube size: 7.0 mm Number of attempts: 1 Airway Equipment and Method: Stylet and Oral airway Placement Confirmation: ETT inserted through vocal cords under direct vision, positive ETCO2 and breath sounds checked- equal and bilateral Secured at: 22 cm Tube secured with: Tape Dental Injury: Teeth and Oropharynx as per pre-operative assessment

## 2024-03-18 NOTE — Op Note (Signed)
 Author: Jina Nephew, MD Service: Surgery Author Type: Physician       Status: Signed    Editor: Jina Nephew, MD (Physician)       PRE-OPERATIVE DIAGNOSIS: liver cyst,left lateral segment  POST-OPERATIVE DIAGNOSIS:  Same  PROCEDURE:  Procedure(s): Laparoscopic liver cyst unroofing (marsupialization or fenestration)  SURGEON:  Surgeon(s): Jina Nephew, MD  Assist:   Eva Barrier, MD  ANESTHESIA:   local and general  DRAINS: none   LOCAL MEDICATIONS USED:  BUPIVICAINE  and LIDOCAINE    SPECIMEN:  Source of Specimen:  liver cyst wall for path, liver cyst fluid for   DISPOSITION OF SPECIMEN:  PATHOLOGY  COUNTS:  YES  DICTATION: .Dragon Dictation  PLAN OF CARE: Admit to inpatient   PATIENT DISPOSITION:  PACU - hemodynamically stable.  FINDINGS:  Liver cyst, left lateral segment  EBL: min  PROCEDURE:   Pt was identified in the holding area and taken to the OR where she was placed supine on the operating room table.  General anesthesia was induced. The patient's arms were tucked, and her abdomen was prepped and draped in sterile fashion.  A timeout was performed according to the surgical safety checklist.  When all was correct, we continued.    The patient was placed into reverse trendelenburg position and rotated to the right.  A 5 mm optiview trocar was placed at the left costal margin after infiltration with local anesthetic.  Pneumoperitoneum was achieved to a pressure of 15 mm Hg.  The camera was introduced into the abdomen there was no evidence of injury.    An additional 5 mm trocar was placed in the left lateral upper abdomen, 1 in the left upper mid abdomen, and 1 in the upper midline.    The cyst was identified.  The triangular ligament did not require mobilization.  The harmonic scalpel was used to enter the cyst.  The fluid was suctioned out and sent for cytology.  The harmonic scalpel was used to take off around 90% of the cyst wall.  The central aspect of the cyst  remained in place against the remaining left lateral segment.  There was a small amount of liver parenchyma at the posterior and superior aspect of the liver.  The argon beam coagulator was used to ensure hemostasis and to fulgurate the remaining cyst wall.  The liver parenchyma was held open well and the liver did not fall over on top of itself.  I was not concerned that the cyst would fill back up.   A 4 quadrant inspection was negative for evidence of bleeding or gross pathology.    The 5 mm trocars were removed.  The pneumoperitoneum was evacuated.  The skin was closed with 4-0 monocryl in subcuticular fashion.  Soft sterile dressings were applied.  Needle, sponge, and instrument counts were correct times 2.    The patient was taken to the PACU in stable condition.

## 2024-03-19 ENCOUNTER — Encounter (HOSPITAL_COMMUNITY): Payer: Self-pay | Admitting: General Surgery

## 2024-03-19 LAB — SURGICAL PATHOLOGY

## 2024-03-19 LAB — CYTOLOGY - NON PAP

## 2024-03-24 ENCOUNTER — Ambulatory Visit: Payer: Self-pay | Admitting: General Surgery

## 2024-03-25 ENCOUNTER — Encounter: Payer: Self-pay | Admitting: Family Medicine

## 2024-03-25 ENCOUNTER — Other Ambulatory Visit: Payer: Self-pay | Admitting: Family Medicine

## 2024-03-25 ENCOUNTER — Ambulatory Visit: Admitting: Family Medicine

## 2024-03-25 DIAGNOSIS — J209 Acute bronchitis, unspecified: Secondary | ICD-10-CM | POA: Diagnosis not present

## 2024-03-25 MED ORDER — PANTOPRAZOLE SODIUM 40 MG PO TBEC: 40.0000 mg | DELAYED_RELEASE_TABLET | Freq: Every day | ORAL | 3 refills | Status: DC

## 2024-03-25 MED ORDER — FLUTICASONE PROPIONATE 50 MCG/ACT NA SUSP: 2.0000 | Freq: Every day | NASAL | 11 refills | Status: AC | PRN

## 2024-03-25 MED ORDER — HYDROXYZINE HCL 10 MG PO TABS: 10.0000 mg | ORAL_TABLET | Freq: Two times a day (BID) | ORAL | 5 refills | Status: AC | PRN

## 2024-03-25 MED ORDER — AMITRIPTYLINE HCL 10 MG PO TABS: 10.0000 mg | ORAL_TABLET | Freq: Every evening | ORAL | 1 refills | Status: AC | PRN

## 2024-03-25 MED ORDER — IPRATROPIUM BROMIDE 0.06 % NA SOLN: 2.0000 | Freq: Four times a day (QID) | NASAL | 11 refills | Status: AC | PRN

## 2024-03-25 MED ORDER — HYDROCHLOROTHIAZIDE 25 MG PO TABS: 25.0000 mg | ORAL_TABLET | Freq: Every day | ORAL | 1 refills | Status: AC

## 2024-03-25 MED ORDER — EZETIMIBE 10 MG PO TABS: 10.0000 mg | ORAL_TABLET | Freq: Every day | ORAL | 3 refills | Status: AC

## 2024-03-25 MED ORDER — ALBUTEROL SULFATE 108 (90 BASE) MCG/ACT IN AEPB: 2.0000 | INHALATION_SPRAY | Freq: Four times a day (QID) | RESPIRATORY_TRACT | 5 refills | Status: DC | PRN

## 2024-03-25 MED ORDER — VENLAFAXINE HCL ER 150 MG PO CP24: 150.0000 mg | ORAL_CAPSULE | Freq: Every day | ORAL | 1 refills | Status: AC

## 2024-03-25 NOTE — Progress Notes (Signed)
 Patient ID: Shannon Quinn, female  DOB: [birth year], 53 y.o.   MRN: [id number] Patient Care Team    Relationship Specialty Notifications Start End  Catherine Charlies LABOR, DO PCP - General Family Medicine  11/03/17   Mable Lenis, MD (Inactive) Consulting Physician Otolaryngology  11/03/17   Tobie Tonita POUR, DO Consulting Physician Neurology  11/03/17   Kay Kemps, MD Consulting Physician Orthopedic Surgery  06/01/19   Gail Favorite, MD Consulting Physician General Surgery  06/01/19   Abran Norleen SAILOR, MD Consulting Physician Gastroenterology  05/23/21   Raford Riggs, MD Attending Physician Cardiology  05/23/21     Chief Complaint  Patient presents with   Anxiety   Depression    Subjective:  DEANDRE STANSEL is a 53 y.o.  Female  present for Chronic Conditions/illness Management All past medical history, surgical history, allergies, family history, immunizations, medications and social history were updated in the electronic medical record today. All recent labs, ED visits and hospitalizations within the last year were reviewed.  Anxiety and depression/Hot flashes due to menopause Pt reports she is feeling well on effexor  150 mg QD- no complaints.      Hypertension/Fhx heart disease Pt reports compliance with HCTZ.  Patient unfortunately encountered myalgias in her hands with use of Crestor  low-dose. Patient denies chest pain, shortness of breath, dizziness or lower extremity edema.  Pt does  take  daily baby ASA. Pt was prescribed statin. She and her family have started a mediterranean diet ant home. RF: HTN, obesity, FH heart disease and MI at 50 in father.   Migraine:   Patient reports her migraines have been well controlled on current regimen of amitriptyline  10 mg at bedtime. Other migraine meds through neuro.       03/25/2024    9:38 AM 12/18/2023    1:17 PM 02/11/2023   11:24 AM 06/21/2022   10:50 AM 01/04/2022    3:05 PM  Depression screen PHQ 2/9  Decreased Interest 1  0 3 1 1   Down, Depressed, Hopeless 0 0 3 1 1   PHQ - 2 Score 1 0 6 2 2   Altered sleeping 0 0 3  2  Tired, decreased energy 1 0 3 2 3   Change in appetite 1 0 3 0 0  Feeling bad or failure about yourself  0 0 3 0 0  Trouble concentrating 0 0 3 3 1   Moving slowly or fidgety/restless 2 0 3 1 1   Suicidal thoughts 0 0  0 0  PHQ-9 Score 5 0 24  9  Difficult doing work/chores Not difficult at all Not difficult at all Extremely dIfficult        03/25/2024    9:39 AM 02/11/2023   11:25 AM 06/21/2022   10:50 AM 01/04/2022    3:06 PM  GAD 7 : Generalized Anxiety Score  Nervous, Anxious, on Edge 1 3 1 1   Control/stop worrying 1 3 1 1   Worry too much - different things 0 3 1 1   Trouble relaxing 2 2 1  0  Restless 1 2 1  0  Easily annoyed or irritable 0 3 1 1   Afraid - awful might happen 1 3 1 1   Total GAD 7 Score 6 19 7 5   Anxiety Difficulty Not difficult at all       Immunization History  Administered Date(s) Administered   Influenza,inj,Quad PF,6+ Mos 04/26/2013, 06/09/2016, 05/24/2019, 05/30/2020, 05/23/2021, 06/21/2022   Influenza,inj,quad, With Preservative 05/17/2019   PFIZER(Purple Top)SARS-COV-2 Vaccination 02/05/2020, 02/22/2020  Tdap 05/25/2012, 06/21/2022   Zoster Recombinant(Shingrix ) 06/01/2019, 11/05/2019    Past Medical History:  Diagnosis Date   Abnormality of right breast on screening mammogram 10/09/2018   Allergy    Atypical chest pain 06/07/2019   Atypical migraine    Claudication in peripheral vascular disease (HCC) 07/10/2021   Colon polyps 2011   3 polpys removed   Complication of anesthesia    severe Reaction Physically ill    Coronary artery disease    Deviated septum    Endometriosis    Family history of adverse reaction to anesthesia    violently ill when waking up from anesthesia - very sick- mom and 1 sister   Hepatic cyst    chronic- stable by yearly PET performed by onc   Hypertension    Hypoglycemic syndrome 1996   during pregnancy    Hypothyroidism    Impingement syndrome of right shoulder region 06/04/2018   Kidney stone    Liver cyst    Lymphedema of right arm    Melanoma (HCC)    03/10/2018   Peripheral vascular disease (HCC)    PONV (postoperative nausea and vomiting)    Pre-diabetes    Shoulder injury    right - side happened from last surgery   Sleep apnea    uses CPAP   Thyroid  disease    Varicose vein of leg    Allergies  Allergen Reactions   Chlorhexidine  Other (See Comments)    Red and purple burns and peeled skin. Cannot use surgical glue.  Pt states Betadine is safe and without reaction.   Crestor  [Rosuvastatin ] Swelling    Swelling in her hands   Iodine Other (See Comments)    Blisters/skin peeling    Neomycin     UNSPECIFIED REACTION    Other Other (See Comments)    dermabond, skin necrosis  Duraprep    Sulfamethoxazole-Trimethoprim     UNSPECIFIED REACTION    Sulfasalazine Other (See Comments)   Sulfonamide Derivatives     UNSPECIFIED REACTION    Augmentin [Amoxicillin -Pot Clavulanate] Nausea And Vomiting    amox ok.    Hydrocodone Nausea And Vomiting    Severe vomiting   Oxycodone  Nausea And Vomiting    Severe vomiting   Prednisone  Other (See Comments)    insomnia   Past Surgical History:  Procedure Laterality Date   BREAST LUMPECTOMY WITH RADIOACTIVE SEED LOCALIZATION Right 10/09/2018   Procedure: RIGHT BREAST LUMPECTOMY WITH RADIOACTIVE SEED LOCALIZATION ERAS PATHWAY;  Surgeon: Gail Favorite, MD;  Location: [facility];  Service: General;  Laterality: Right;   COLONOSCOPY     polyps   ENDOVENOUS ABLATION SAPHENOUS VEIN W/ LASER Left 01/31/2022   endovenous laser ablation left greater saphenous vein and stab phlebectomy<10 incisions left leg by Gaile New MD   LAPAROSCOPIC LIVER CYST REMOVAL  03/18/2024   Procedure: EXCISION, CYST, LIVER, LAPAROSCOPIC;  Surgeon: Aron Shoulders, MD;  Location: [facility];  Service: General;;   LAPAROSCOPIC TOTAL HYSTERECTOMY   09/04/2017   Dr. Tomblin for endometriosis   MELANOMA EXCISION WITH SENTINEL LYMPH NODE BIOPSY Left 04/02/2018   Procedure: INJECT BLUE DYE MELANOMA LEFT BACK WITH  WIDE LOCAL EXCISION MELANOMA LEFT BACK BILATERAL AXILLARY DEEP SENTINEL LYMPH NODE BIOPSY;  Surgeon: Gail Favorite, MD;  Location: [facility];  Service: General;  Laterality: Left;   TUBAL LIGATION     WISDOM TOOTH EXTRACTION     Family History  Problem Relation Age of Onset   Arthritis Mother  Dementia Mother 60   Stroke Mother    Osteoporosis Mother    Alcohol abuse Father    Arthritis Father    Hyperlipidemia Father    Stroke Father        2012   Depression Father    Diabetes Father    Heart disease Father 80   Heart failure Father    Heart attack Father    Diabetes Sister    Lung cancer Sister        stage 4, met to Brain   CAD Maternal Uncle    Colon cancer Maternal Uncle    Cancer Maternal Uncle    CAD Paternal Aunt    Congestive Heart Failure Paternal Aunt    Kidney failure Paternal Aunt    Diabetes Paternal Aunt    Cancer Paternal Uncle        breast cancer   Colon cancer Paternal Uncle    Breast cancer Maternal Grandmother 70   Dementia Maternal Grandmother    Heart disease Maternal Grandfather 60   CAD Maternal Grandfather    Diabetes Paternal Grandmother    COPD Paternal Grandfather        emphysema   Esophageal cancer Neg Hx    Stomach cancer Neg Hx    Rectal cancer Neg Hx    Social History   Social History Narrative   Regular exercise: walking 1 mile per day   Caffeine use: soda drinker   Lives with husband and 2 children in a one story home.     Works for [employer] [place].  She works in Animal nutritionist   Education: associates degree.      Allergies as of 03/25/2024       Reactions   Chlorhexidine  Other (See Comments)   Red and purple burns and peeled skin. Cannot use surgical glue.  Pt states Betadine is safe and without reaction.    Crestor  [rosuvastatin ] Swelling   Swelling in her hands   Iodine Other (See Comments)   Blisters/skin peeling    Neomycin    UNSPECIFIED REACTION    Other Other (See Comments)   dermabond, skin necrosis Duraprep    Sulfamethoxazole-trimethoprim    UNSPECIFIED REACTION    Sulfasalazine Other (See Comments)   Sulfonamide Derivatives    UNSPECIFIED REACTION    Augmentin [amoxicillin -pot Clavulanate] Nausea And Vomiting   amox ok.    Hydrocodone Nausea And Vomiting   Severe vomiting   Oxycodone  Nausea And Vomiting   Severe vomiting   Prednisone  Other (See Comments)   insomnia        Medication List        Accurate as of March 25, 2024 10:00 AM. If you have any questions, ask your nurse or doctor.          STOP taking these medications    Airsupra  90-80 MCG/ACT Aero Generic drug: Albuterol -Budesonide Stopped by: Charlies Bellini       TAKE these medications    Albuterol  Sulfate 108 (90 Base) MCG/ACT Aepb Commonly known as: PROAIR  RESPICLICK Inhale 2 puffs into the lungs every 6 (six) hours as needed.   amitriptyline  10 MG tablet Commonly known as: ELAVIL  Take 1 tablet (10 mg total) by mouth at bedtime as needed for sleep.   aspirin  EC 81 MG tablet Take 1 tablet (81 mg total) by mouth daily.   clonazePAM  0.5 MG tablet Commonly known as: KLONOPIN  Take 1 tablet (0.5 mg total) by mouth 2 (two) times daily  as needed for anxiety.   Excedrin Migraine 250-250-65 MG tablet Generic drug: aspirin-acetaminophen-caffeine Take 1-2 tablets by mouth every 6 (six) hours as needed for headache or migraine.   ezetimibe 10 MG tablet Commonly known as: ZETIA Take 1 tablet (10 mg total) by mouth daily.   fluticasone 50 MCG/ACT nasal spray Commonly known as: FLONASE Place 2 sprays into both nostrils daily as needed for allergies or rhinitis.   hydrochlorothiazide 25 MG tablet Commonly known as: HYDRODIURIL Take 1 tablet (25 mg total) by mouth daily.   hydrOXYzine 10 MG  tablet Commonly known as: ATARAX Take 1-3 tablets (10-30 mg total) by mouth 2 (two) times daily as needed.   ipratropium 0.06 % nasal spray Commonly known as: ATROVENT Place 2 sprays into both nostrils 4 (four) times daily as needed for rhinitis.   Nurtec 75 MG Tbdp Generic drug: Rimegepant Sulfate Take 1 tablet (75 mg total) by mouth as needed (for severe migraine).   Omega-3 1000 MG Caps Take 2,000 mg by mouth.   ondansetron 8 MG disintegrating tablet Commonly known as: ZOFRAN-ODT Take 0.5 tablets (4 mg total) by mouth every 8 (eight) hours as needed for nausea or vomiting.   oxyCODONE 5 MG immediate release tablet Commonly known as: Oxy IR/ROXICODONE Take 0.5-1 tablets (2.5-5 mg total) by mouth every 6 (six) hours as needed for breakthrough pain. Take with nausea medication   pantoprazole 40 MG tablet Commonly known as: PROTONIX Take 1 tablet (40 mg total) by mouth daily.   prochlorperazine 5 MG tablet Commonly known as: COMPAZINE Take 1 tablet (5 mg total) by mouth every 6 (six) hours as needed for nausea or vomiting.   scopolamine 1 MG/3DAYS Commonly known as: TRANSDERM-SCOP Place 1 patch (1.5 mg total) onto the skin every 3 (three) days.   topiramate 50 MG tablet Commonly known as: TOPAMAX Take 1 tablet (50 mg total) by mouth 2 (two) times daily.   traMADol 50 MG tablet Commonly known as: ULTRAM Take 1-2 tablets (50-100 mg total) by mouth every 6 (six) hours as needed for moderate pain (pain score 4-6) or severe pain (pain score 7-10).   venlafaxine XR 150 MG 24 hr capsule Commonly known as: EFFEXOR-XR Take 1 capsule (150 mg total) by mouth daily with breakfast.        All past medical history, surgical history, allergies, family history, immunizations andmedications were updated in the EMR today and reviewed under the history and medication portions of their EMR.      ROS: 14 pt review of systems performed and negative (unless mentioned in an  HPI)  Objective: BP 124/74   Pulse 74   Temp 97.9 F (36.6 C)   Wt 238 lb 9.6 oz (108.2 kg)   LMP 08/23/2017   SpO2 98%   BMI 40.32 kg/m  Physical Exam Vitals and nursing note reviewed.  Constitutional:      General: She is not in acute distress.    Appearance: Normal appearance. She is not ill-appearing, toxic-appearing or diaphoretic.  HENT:     Head: Normocephalic and atraumatic.  Eyes:     General: No scleral icterus.       Right eye: No discharge.        Left eye: No discharge.     Extraocular Movements: Extraocular movements intact.     Conjunctiva/sclera: Conjunctivae normal.     Pupils: Pupils are equal, round, and reactive to light.  Cardiovascular:     Rate and Rhythm: Normal rate and regular rhythm.  Heart sounds: No murmur heard. Pulmonary:     Effort: Pulmonary effort is normal. No respiratory distress.     Breath sounds: Normal breath sounds. No wheezing, rhonchi or rales.  Musculoskeletal:     Cervical back: Neck supple.     Right lower leg: No edema.     Left lower leg: No edema.  Skin:    General: Skin is warm.     Findings: No rash.  Neurological:     Mental Status: She is alert and oriented to person, place, and time. Mental status is at baseline.     Motor: No weakness.     Gait: Gait normal.  Psychiatric:        Mood and Affect: Mood normal.        Behavior: Behavior normal.        Thought Content: Thought content normal.        Judgment: Judgment normal.     No results found.  Assessment/plan: LEVIA WALTERMIRE is a 53 y.o. female present for chronic condition management Migraine: Stable  continue amitriptyline  10 mg (only on weekends-pt preference)  Continue Vistaril  as needed Continue Topamax  -neurology Continue Zofran  4 mg every 8 hours as needed   Anxiety and depression/Hot flashes due to menopause Stable Continue effexor  150 mg every day Dc- ativan , start klonopin  0.5 mg BID PRN  Hypertension/Fhx heart disease/Family  history of heart disease/Coronary arteriosclerosis in native artery/Morbid obesity [facility]):  stable continue hctz 25 Unable to tolerate Crestor . continue zetia   Melanoma of skin (HCC) continue to follow with dermatology  Return in about 24 weeks (around 09/09/2024) for cpe (20 min), Routine chronic condition follow-up.  No orders of the defined types were placed in this encounter.   No orders of the defined types were placed in this encounter.  Meds ordered this encounter  Medications   amitriptyline  (ELAVIL ) 10 MG tablet    Sig: Take 1 tablet (10 mg total) by mouth at bedtime as needed for sleep.    Dispense:  90 tablet    Refill:  1   hydrochlorothiazide  (HYDRODIURIL ) 25 MG tablet    Sig: Take 1 tablet (25 mg total) by mouth daily.    Dispense:  90 tablet    Refill:  1   hydrOXYzine  (ATARAX ) 10 MG tablet    Sig: Take 1-3 tablets (10-30 mg total) by mouth 2 (two) times daily as needed.    Dispense:  90 tablet    Refill:  5   venlafaxine  XR (EFFEXOR -XR) 150 MG 24 hr capsule    Sig: Take 1 capsule (150 mg total) by mouth daily with breakfast.    Dispense:  90 capsule    Refill:  1   ezetimibe  (ZETIA ) 10 MG tablet    Sig: Take 1 tablet (10 mg total) by mouth daily.    Dispense:  90 tablet    Refill:  3   pantoprazole  (PROTONIX ) 40 MG tablet    Sig: Take 1 tablet (40 mg total) by mouth daily.    Dispense:  30 tablet    Refill:  3   fluticasone  (FLONASE ) 50 MCG/ACT nasal spray    Sig: Place 2 sprays into both nostrils daily as needed for allergies or rhinitis.    Dispense:  16 g    Refill:  11   Albuterol  Sulfate (PROAIR  RESPICLICK) 108 (90 Base) MCG/ACT AEPB    Sig: Inhale 2 puffs into the lungs every 6 (six) hours as needed.    Dispense:  1 each    Refill:  5   ipratropium (ATROVENT ) 0.06 % nasal spray    Sig: Place 2 sprays into both nostrils 4 (four) times daily as needed for rhinitis.    Dispense:  15 mL    Refill:  11   Referral Orders  No referral(s) requested  today     Electronically signed by: Charlies Bellini, DO [facility]

## 2024-03-25 NOTE — Patient Instructions (Addendum)

## 2024-04-01 ENCOUNTER — Other Ambulatory Visit: Payer: Self-pay | Admitting: Family Medicine

## 2024-04-01 DIAGNOSIS — Z1231 Encounter for screening mammogram for malignant neoplasm of breast: Secondary | ICD-10-CM

## 2024-04-05 DIAGNOSIS — Z85820 Personal history of malignant melanoma of skin: Secondary | ICD-10-CM | POA: Diagnosis not present

## 2024-04-05 DIAGNOSIS — D2222 Melanocytic nevi of left ear and external auricular canal: Secondary | ICD-10-CM | POA: Diagnosis not present

## 2024-04-05 DIAGNOSIS — D2261 Melanocytic nevi of right upper limb, including shoulder: Secondary | ICD-10-CM | POA: Diagnosis not present

## 2024-04-05 DIAGNOSIS — D2262 Melanocytic nevi of left upper limb, including shoulder: Secondary | ICD-10-CM | POA: Diagnosis not present

## 2024-04-15 DIAGNOSIS — R590 Localized enlarged lymph nodes: Secondary | ICD-10-CM | POA: Diagnosis not present

## 2024-04-15 DIAGNOSIS — C779 Secondary and unspecified malignant neoplasm of lymph node, unspecified: Secondary | ICD-10-CM | POA: Diagnosis not present

## 2024-04-23 DIAGNOSIS — C773 Secondary and unspecified malignant neoplasm of axilla and upper limb lymph nodes: Secondary | ICD-10-CM | POA: Diagnosis not present

## 2024-04-23 DIAGNOSIS — R2230 Localized swelling, mass and lump, unspecified upper limb: Secondary | ICD-10-CM | POA: Diagnosis not present

## 2024-04-23 DIAGNOSIS — C4359 Malignant melanoma of other part of trunk: Secondary | ICD-10-CM | POA: Diagnosis not present

## 2024-04-23 DIAGNOSIS — Z85820 Personal history of malignant melanoma of skin: Secondary | ICD-10-CM | POA: Diagnosis not present

## 2024-04-28 ENCOUNTER — Ambulatory Visit
Admission: RE | Admit: 2024-04-28 | Discharge: 2024-04-28 | Disposition: A | Discharge status: Home / Self Care | Source: Ambulatory Visit | Attending: Family Medicine | Admitting: Family Medicine

## 2024-04-28 DIAGNOSIS — Z1231 Encounter for screening mammogram for malignant neoplasm of breast: Secondary | ICD-10-CM | POA: Diagnosis not present

## 2024-04-30 ENCOUNTER — Ambulatory Visit: Payer: Self-pay | Admitting: Family Medicine

## 2024-05-04 DIAGNOSIS — C439 Malignant melanoma of skin, unspecified: Secondary | ICD-10-CM | POA: Diagnosis not present

## 2024-05-04 DIAGNOSIS — N6331 Unspecified lump in axillary tail of the right breast: Secondary | ICD-10-CM | POA: Diagnosis not present

## 2024-05-04 DIAGNOSIS — R590 Localized enlarged lymph nodes: Secondary | ICD-10-CM | POA: Diagnosis not present

## 2024-05-12 DIAGNOSIS — C4359 Malignant melanoma of other part of trunk: Secondary | ICD-10-CM | POA: Diagnosis not present

## 2024-05-12 DIAGNOSIS — R590 Localized enlarged lymph nodes: Secondary | ICD-10-CM | POA: Diagnosis not present

## 2024-05-12 DIAGNOSIS — K769 Liver disease, unspecified: Secondary | ICD-10-CM | POA: Diagnosis not present

## 2024-05-12 DIAGNOSIS — Z08 Encounter for follow-up examination after completed treatment for malignant neoplasm: Secondary | ICD-10-CM | POA: Diagnosis not present

## 2024-07-22 ENCOUNTER — Other Ambulatory Visit: Payer: Self-pay | Admitting: Family Medicine

## 2024-08-27 ENCOUNTER — Other Ambulatory Visit: Payer: Self-pay | Admitting: Neurology

## 2024-09-16 ENCOUNTER — Other Ambulatory Visit: Payer: Self-pay | Admitting: Family Medicine

## 2024-12-21 ENCOUNTER — Ambulatory Visit: Admitting: Neurology
